# Patient Record
Sex: Male | Born: 1950 | Race: White | Hispanic: No | Marital: Married | Smoking: Former smoker
Health system: Southern US, Community
[De-identification: ages and names within clinical notes are randomized; demographics above are authoritative.]

## PROBLEM LIST (undated history)

## (undated) DIAGNOSIS — G2581 Restless legs syndrome: Secondary | ICD-10-CM

## (undated) DIAGNOSIS — I2510 Atherosclerotic heart disease of native coronary artery without angina pectoris: Secondary | ICD-10-CM

## (undated) DIAGNOSIS — E119 Type 2 diabetes mellitus without complications: Secondary | ICD-10-CM

## (undated) DIAGNOSIS — I10 Essential (primary) hypertension: Secondary | ICD-10-CM

## (undated) DIAGNOSIS — E785 Hyperlipidemia, unspecified: Secondary | ICD-10-CM

## (undated) DIAGNOSIS — M1990 Unspecified osteoarthritis, unspecified site: Secondary | ICD-10-CM

## (undated) DIAGNOSIS — K5790 Diverticulosis of intestine, part unspecified, without perforation or abscess without bleeding: Secondary | ICD-10-CM

## (undated) DIAGNOSIS — R51 Headache: Secondary | ICD-10-CM

## (undated) DIAGNOSIS — G8929 Other chronic pain: Secondary | ICD-10-CM

## (undated) DIAGNOSIS — R519 Headache, unspecified: Secondary | ICD-10-CM

## (undated) DIAGNOSIS — D649 Anemia, unspecified: Secondary | ICD-10-CM

## (undated) DIAGNOSIS — I209 Angina pectoris, unspecified: Secondary | ICD-10-CM

## (undated) DIAGNOSIS — G4700 Insomnia, unspecified: Secondary | ICD-10-CM

## (undated) DIAGNOSIS — M542 Cervicalgia: Secondary | ICD-10-CM

## (undated) HISTORY — PX: APPENDECTOMY: SHX54

## (undated) HISTORY — PX: OTHER SURGICAL HISTORY: SHX169

## (undated) HISTORY — DX: Hyperlipidemia, unspecified: E78.5

## (undated) HISTORY — DX: Headache, unspecified: R51.9

## (undated) HISTORY — DX: Headache: R51

## (undated) HISTORY — DX: Cervicalgia: M54.2

## (undated) HISTORY — DX: Insomnia, unspecified: G47.00

## (undated) HISTORY — PX: CYST REMOVAL HAND: SHX6279

## (undated) HISTORY — DX: Essential (primary) hypertension: I10

## (undated) HISTORY — DX: Other chronic pain: G89.29

## (undated) HISTORY — DX: Atherosclerotic heart disease of native coronary artery without angina pectoris: I25.10

---

## 2000-05-11 ENCOUNTER — Emergency Department (HOSPITAL_COMMUNITY): Admission: EM | Admit: 2000-05-11 | Discharge: 2000-05-11 | Payer: Self-pay | Admitting: Emergency Medicine

## 2000-07-06 DIAGNOSIS — I2510 Atherosclerotic heart disease of native coronary artery without angina pectoris: Secondary | ICD-10-CM | POA: Insufficient documentation

## 2000-07-06 HISTORY — PX: CORONARY ARTERY BYPASS GRAFT: SHX141

## 2000-12-11 ENCOUNTER — Inpatient Hospital Stay (HOSPITAL_COMMUNITY): Admission: EM | Admit: 2000-12-11 | Discharge: 2000-12-18 | Payer: Self-pay | Admitting: Emergency Medicine

## 2000-12-13 ENCOUNTER — Encounter: Payer: Self-pay | Admitting: Thoracic Surgery (Cardiothoracic Vascular Surgery)

## 2000-12-14 ENCOUNTER — Encounter: Payer: Self-pay | Admitting: Thoracic Surgery (Cardiothoracic Vascular Surgery)

## 2000-12-15 ENCOUNTER — Encounter: Payer: Self-pay | Admitting: Thoracic Surgery (Cardiothoracic Vascular Surgery)

## 2000-12-16 ENCOUNTER — Encounter: Payer: Self-pay | Admitting: Thoracic Surgery (Cardiothoracic Vascular Surgery)

## 2000-12-27 ENCOUNTER — Encounter
Admission: RE | Admit: 2000-12-27 | Discharge: 2000-12-27 | Payer: Self-pay | Admitting: Thoracic Surgery (Cardiothoracic Vascular Surgery)

## 2000-12-27 ENCOUNTER — Encounter: Payer: Self-pay | Admitting: Thoracic Surgery (Cardiothoracic Vascular Surgery)

## 2001-01-11 ENCOUNTER — Encounter (HOSPITAL_COMMUNITY): Admission: RE | Admit: 2001-01-11 | Discharge: 2001-02-07 | Payer: Self-pay | Admitting: Cardiology

## 2001-06-04 ENCOUNTER — Emergency Department (HOSPITAL_COMMUNITY): Admission: EM | Admit: 2001-06-04 | Discharge: 2001-06-04 | Payer: Self-pay | Admitting: Emergency Medicine

## 2003-01-31 ENCOUNTER — Inpatient Hospital Stay (HOSPITAL_COMMUNITY): Admission: EM | Admit: 2003-01-31 | Discharge: 2003-02-02 | Payer: Self-pay | Admitting: Emergency Medicine

## 2004-05-18 ENCOUNTER — Emergency Department (HOSPITAL_COMMUNITY): Admission: EM | Admit: 2004-05-18 | Discharge: 2004-05-18 | Payer: Self-pay | Admitting: Family Medicine

## 2004-05-18 ENCOUNTER — Ambulatory Visit (HOSPITAL_COMMUNITY): Admission: RE | Admit: 2004-05-18 | Discharge: 2004-05-18 | Payer: Self-pay | Admitting: Family Medicine

## 2004-09-05 ENCOUNTER — Ambulatory Visit: Payer: Self-pay | Admitting: Cardiology

## 2005-01-07 ENCOUNTER — Ambulatory Visit: Payer: Self-pay | Admitting: Family Medicine

## 2005-01-14 ENCOUNTER — Ambulatory Visit: Payer: Self-pay | Admitting: Family Medicine

## 2005-05-12 ENCOUNTER — Ambulatory Visit: Payer: Self-pay | Admitting: Family Medicine

## 2005-10-12 ENCOUNTER — Ambulatory Visit: Payer: Self-pay | Admitting: Family Medicine

## 2005-11-08 ENCOUNTER — Emergency Department (HOSPITAL_COMMUNITY): Admission: EM | Admit: 2005-11-08 | Discharge: 2005-11-08 | Payer: Self-pay | Admitting: Emergency Medicine

## 2006-02-03 ENCOUNTER — Ambulatory Visit: Payer: Self-pay | Admitting: Family Medicine

## 2006-03-26 ENCOUNTER — Ambulatory Visit: Payer: Self-pay | Admitting: Internal Medicine

## 2006-04-02 ENCOUNTER — Ambulatory Visit: Payer: Self-pay | Admitting: Internal Medicine

## 2006-04-02 HISTORY — PX: COLONOSCOPY: SHX174

## 2006-05-05 ENCOUNTER — Ambulatory Visit: Payer: Self-pay | Admitting: Cardiology

## 2006-05-10 ENCOUNTER — Ambulatory Visit: Payer: Self-pay | Admitting: Cardiology

## 2006-05-11 ENCOUNTER — Ambulatory Visit: Payer: Self-pay | Admitting: Family Medicine

## 2006-05-14 ENCOUNTER — Ambulatory Visit: Payer: Self-pay

## 2006-05-25 ENCOUNTER — Ambulatory Visit: Payer: Self-pay | Admitting: Family Medicine

## 2006-06-08 ENCOUNTER — Ambulatory Visit: Payer: Self-pay | Admitting: Family Medicine

## 2006-06-14 ENCOUNTER — Encounter: Admission: RE | Admit: 2006-06-14 | Discharge: 2006-09-12 | Payer: Self-pay | Admitting: Family Medicine

## 2006-07-01 ENCOUNTER — Ambulatory Visit: Payer: Self-pay | Admitting: Family Medicine

## 2006-09-03 ENCOUNTER — Ambulatory Visit: Payer: Self-pay | Admitting: Family Medicine

## 2006-11-01 ENCOUNTER — Ambulatory Visit: Payer: Self-pay | Admitting: Family Medicine

## 2007-04-04 ENCOUNTER — Ambulatory Visit: Payer: Self-pay | Admitting: Family Medicine

## 2007-04-04 DIAGNOSIS — I10 Essential (primary) hypertension: Secondary | ICD-10-CM | POA: Insufficient documentation

## 2007-04-04 DIAGNOSIS — E785 Hyperlipidemia, unspecified: Secondary | ICD-10-CM | POA: Insufficient documentation

## 2007-04-04 DIAGNOSIS — I2510 Atherosclerotic heart disease of native coronary artery without angina pectoris: Secondary | ICD-10-CM | POA: Insufficient documentation

## 2007-04-11 ENCOUNTER — Telehealth: Payer: Self-pay | Admitting: Family Medicine

## 2007-05-02 ENCOUNTER — Telehealth: Payer: Self-pay | Admitting: Family Medicine

## 2007-05-03 ENCOUNTER — Ambulatory Visit: Payer: Self-pay | Admitting: Family Medicine

## 2007-05-03 DIAGNOSIS — H9190 Unspecified hearing loss, unspecified ear: Secondary | ICD-10-CM | POA: Insufficient documentation

## 2007-05-06 ENCOUNTER — Ambulatory Visit: Payer: Self-pay | Admitting: Family Medicine

## 2007-05-09 LAB — CONVERTED CEMR LAB
ALT: 50 units/L (ref 0–53)
AST: 33 units/L (ref 0–37)
BUN: 11 mg/dL (ref 6–23)
Basophils Absolute: 0 10*3/uL (ref 0.0–0.1)
Basophils Relative: 0.7 % (ref 0.0–1.0)
CO2: 30 meq/L (ref 19–32)
Calcium: 9.4 mg/dL (ref 8.4–10.5)
Chloride: 107 meq/L (ref 96–112)
Direct LDL: 156.3 mg/dL
Eosinophils Absolute: 0.2 10*3/uL (ref 0.0–0.6)
HCT: 45.8 % (ref 39.0–52.0)
Lymphocytes Relative: 33.9 % (ref 12.0–46.0)
Neutrophils Relative %: 51.9 % (ref 43.0–77.0)
Potassium: 3.9 meq/L (ref 3.5–5.1)
RBC: 4.88 M/uL (ref 4.22–5.81)
Sodium: 143 meq/L (ref 135–145)
Total Bilirubin: 0.9 mg/dL (ref 0.3–1.2)
Triglycerides: 102 mg/dL (ref 0–149)

## 2007-05-20 ENCOUNTER — Encounter: Payer: Self-pay | Admitting: Family Medicine

## 2007-07-04 ENCOUNTER — Encounter: Payer: Self-pay | Admitting: Family Medicine

## 2007-08-26 ENCOUNTER — Encounter: Payer: Self-pay | Admitting: Family Medicine

## 2007-09-02 ENCOUNTER — Telehealth: Payer: Self-pay | Admitting: Family Medicine

## 2007-09-13 ENCOUNTER — Encounter: Payer: Self-pay | Admitting: Family Medicine

## 2007-09-27 ENCOUNTER — Ambulatory Visit: Payer: Self-pay | Admitting: Family Medicine

## 2007-10-31 ENCOUNTER — Encounter: Payer: Self-pay | Admitting: Family Medicine

## 2007-12-22 ENCOUNTER — Telehealth: Payer: Self-pay | Admitting: Family Medicine

## 2008-02-06 ENCOUNTER — Telehealth: Payer: Self-pay | Admitting: Family Medicine

## 2008-05-16 ENCOUNTER — Telehealth: Payer: Self-pay | Admitting: Family Medicine

## 2008-07-16 ENCOUNTER — Ambulatory Visit: Payer: Self-pay | Admitting: Family Medicine

## 2008-07-16 ENCOUNTER — Telehealth: Payer: Self-pay | Admitting: Family Medicine

## 2008-07-16 DIAGNOSIS — R519 Headache, unspecified: Secondary | ICD-10-CM | POA: Insufficient documentation

## 2008-07-16 DIAGNOSIS — M79609 Pain in unspecified limb: Secondary | ICD-10-CM | POA: Insufficient documentation

## 2008-07-16 DIAGNOSIS — R51 Headache: Secondary | ICD-10-CM | POA: Insufficient documentation

## 2008-07-18 ENCOUNTER — Telehealth: Payer: Self-pay | Admitting: Family Medicine

## 2008-07-18 ENCOUNTER — Ambulatory Visit: Payer: Self-pay | Admitting: Cardiology

## 2008-07-18 ENCOUNTER — Ambulatory Visit: Payer: Self-pay | Admitting: Family Medicine

## 2008-07-19 ENCOUNTER — Encounter: Payer: Self-pay | Admitting: Family Medicine

## 2008-07-19 LAB — CONVERTED CEMR LAB
BUN: 13 mg/dL (ref 6–23)
CO2: 30 meq/L (ref 19–32)
Calcium: 9.7 mg/dL (ref 8.4–10.5)
Chloride: 102 meq/L (ref 96–112)
GFR calc Af Amer: 128 mL/min
GFR calc non Af Amer: 106 mL/min
Glucose, Bld: 182 mg/dL — ABNORMAL HIGH (ref 70–99)
Potassium: 3.9 meq/L (ref 3.5–5.1)
Sodium: 140 meq/L (ref 135–145)

## 2008-07-30 ENCOUNTER — Telehealth: Payer: Self-pay | Admitting: Family Medicine

## 2008-08-07 ENCOUNTER — Encounter: Payer: Self-pay | Admitting: Family Medicine

## 2008-08-08 ENCOUNTER — Telehealth: Payer: Self-pay | Admitting: Family Medicine

## 2008-08-13 ENCOUNTER — Encounter: Admission: RE | Admit: 2008-08-13 | Discharge: 2008-08-13 | Payer: Self-pay | Admitting: Specialist

## 2008-08-21 ENCOUNTER — Ambulatory Visit: Payer: Self-pay | Admitting: Family Medicine

## 2008-08-21 LAB — CONVERTED CEMR LAB
Bilirubin Urine: NEGATIVE
Blood in Urine, dipstick: NEGATIVE
Nitrite: NEGATIVE
Specific Gravity, Urine: 1.025

## 2008-08-27 LAB — CONVERTED CEMR LAB
ALT: 64 units/L — ABNORMAL HIGH (ref 0–53)
AST: 48 units/L — ABNORMAL HIGH (ref 0–37)
Albumin: 4.1 g/dL (ref 3.5–5.2)
BUN: 17 mg/dL (ref 6–23)
Basophils Relative: 0.3 % (ref 0.0–3.0)
Bilirubin, Direct: 0.1 mg/dL (ref 0.0–0.3)
CO2: 30 meq/L (ref 19–32)
Calcium: 9.7 mg/dL (ref 8.4–10.5)
Eosinophils Absolute: 0.1 10*3/uL (ref 0.0–0.7)
Eosinophils Relative: 1.5 % (ref 0.0–5.0)
GFR calc Af Amer: 128 mL/min
GFR calc non Af Amer: 106 mL/min
HCT: 45.4 % (ref 39.0–52.0)
HDL: 32.4 mg/dL — ABNORMAL LOW (ref >39.0)
Hgb A1c MFr Bld: 7 % — ABNORMAL HIGH (ref 4.6–6.0)
MCV: 95.6 fL (ref 78.0–100.0)
Monocytes Absolute: 0.8 10*3/uL (ref 0.1–1.0)
Monocytes Relative: 11.6 % (ref 3.0–12.0)
Potassium: 3.9 meq/L (ref 3.5–5.1)
TSH: 2.48 microintl units/mL (ref 0.35–5.50)
Total CHOL/HDL Ratio: 6.5
Total Protein: 7.2 g/dL (ref 6.0–8.3)
Triglycerides: 139 mg/dL (ref 0–149)
VLDL: 28 mg/dL (ref 0–40)
WBC: 6.9 10*3/uL (ref 4.5–10.5)

## 2008-08-28 ENCOUNTER — Ambulatory Visit: Payer: Self-pay | Admitting: Family Medicine

## 2008-08-31 ENCOUNTER — Telehealth: Payer: Self-pay | Admitting: Family Medicine

## 2008-09-20 ENCOUNTER — Emergency Department (HOSPITAL_COMMUNITY): Admission: EM | Admit: 2008-09-20 | Discharge: 2008-09-20 | Payer: Self-pay | Admitting: Emergency Medicine

## 2008-09-24 ENCOUNTER — Telehealth: Payer: Self-pay | Admitting: Family Medicine

## 2008-11-16 ENCOUNTER — Telehealth: Payer: Self-pay | Admitting: Family Medicine

## 2008-11-22 ENCOUNTER — Encounter: Payer: Self-pay | Admitting: Family Medicine

## 2008-12-26 ENCOUNTER — Encounter: Payer: Self-pay | Admitting: Family Medicine

## 2008-12-26 DIAGNOSIS — I10 Essential (primary) hypertension: Secondary | ICD-10-CM

## 2008-12-26 HISTORY — DX: Essential (primary) hypertension: I10

## 2009-07-04 ENCOUNTER — Ambulatory Visit: Payer: Self-pay | Admitting: Family Medicine

## 2009-07-09 LAB — CONVERTED CEMR LAB: Glucose, Bld: 128 mg/dL — ABNORMAL HIGH (ref 70–99)

## 2009-07-11 ENCOUNTER — Telehealth: Payer: Self-pay | Admitting: Family Medicine

## 2009-09-03 ENCOUNTER — Telehealth: Payer: Self-pay | Admitting: Family Medicine

## 2009-09-03 ENCOUNTER — Ambulatory Visit: Payer: Self-pay | Admitting: Family Medicine

## 2009-09-03 DIAGNOSIS — M542 Cervicalgia: Secondary | ICD-10-CM | POA: Insufficient documentation

## 2009-09-09 ENCOUNTER — Ambulatory Visit: Payer: Self-pay | Admitting: Family Medicine

## 2009-09-25 ENCOUNTER — Encounter: Payer: Self-pay | Admitting: Family Medicine

## 2009-10-16 ENCOUNTER — Ambulatory Visit: Payer: Self-pay | Admitting: Family Medicine

## 2009-10-18 LAB — CONVERTED CEMR LAB
Albumin: 4.2 g/dL (ref 3.5–5.2)
BUN: 10 mg/dL (ref 6–23)
Calcium: 9.4 mg/dL (ref 8.4–10.5)
Chloride: 103 meq/L (ref 96–112)
Creatinine, Ser: 0.8 mg/dL (ref 0.4–1.5)
GFR calc non Af Amer: 104.79 mL/min (ref >60)
Glucose, Bld: 170 mg/dL — ABNORMAL HIGH (ref 70–99)
Hemoglobin: 15.1 g/dL (ref 13.0–17.0)
Hgb A1c MFr Bld: 6.5 % (ref 4.6–6.5)
MCV: 95.5 fL (ref 78.0–100.0)
Monocytes Absolute: 0.8 10*3/uL (ref 0.1–1.0)
Neutro Abs: 3 10*3/uL (ref 1.4–7.7)
RBC: 4.63 M/uL (ref 4.22–5.81)
RDW: 13 % (ref 11.5–14.6)
TSH: 2.52 microintl units/mL (ref 0.35–5.50)
WBC: 6.7 10*3/uL (ref 4.5–10.5)

## 2009-10-21 ENCOUNTER — Telehealth: Payer: Self-pay | Admitting: Family Medicine

## 2009-11-20 ENCOUNTER — Encounter: Payer: Self-pay | Admitting: Family Medicine

## 2009-12-20 ENCOUNTER — Ambulatory Visit: Payer: Self-pay | Admitting: Family Medicine

## 2009-12-23 ENCOUNTER — Telehealth: Payer: Self-pay | Admitting: Family Medicine

## 2009-12-24 ENCOUNTER — Encounter: Payer: Self-pay | Admitting: Family Medicine

## 2009-12-27 ENCOUNTER — Telehealth: Payer: Self-pay | Admitting: Family Medicine

## 2009-12-27 ENCOUNTER — Encounter: Admission: RE | Admit: 2009-12-27 | Discharge: 2009-12-27 | Payer: Self-pay | Admitting: Family Medicine

## 2010-01-03 ENCOUNTER — Telehealth: Payer: Self-pay | Admitting: Family Medicine

## 2010-01-07 ENCOUNTER — Encounter: Payer: Self-pay | Admitting: Family Medicine

## 2010-01-20 ENCOUNTER — Telehealth (INDEPENDENT_AMBULATORY_CARE_PROVIDER_SITE_OTHER): Payer: Self-pay | Admitting: *Deleted

## 2010-01-29 ENCOUNTER — Encounter: Payer: Self-pay | Admitting: Family Medicine

## 2010-03-13 ENCOUNTER — Ambulatory Visit: Payer: Self-pay | Admitting: Family Medicine

## 2010-03-13 LAB — CONVERTED CEMR LAB
Bilirubin Urine: NEGATIVE
Ketones, urine, test strip: NEGATIVE
Nitrite: NEGATIVE
Urobilinogen, UA: 0.2
pH: 5

## 2010-03-14 ENCOUNTER — Encounter: Payer: Self-pay | Admitting: Family Medicine

## 2010-04-08 ENCOUNTER — Encounter: Payer: Self-pay | Admitting: Family Medicine

## 2010-05-12 ENCOUNTER — Encounter: Payer: Self-pay | Admitting: Family Medicine

## 2010-05-21 ENCOUNTER — Telehealth: Payer: Self-pay | Admitting: Family Medicine

## 2010-05-28 ENCOUNTER — Ambulatory Visit: Payer: Self-pay | Admitting: Family Medicine

## 2010-06-09 ENCOUNTER — Ambulatory Visit: Payer: Self-pay | Admitting: Family Medicine

## 2010-06-09 DIAGNOSIS — F411 Generalized anxiety disorder: Secondary | ICD-10-CM | POA: Insufficient documentation

## 2010-08-05 NOTE — Progress Notes (Signed)
Summary: disability  Phone Note Call from Patient   Caller: Patient Call For: Nelwyn Salisbury MD Summary of Call: [phone] Pt. is ready to have papers sent for disability.  They have applied, and papers should be on the way. Initial call taken by: Lynann Beaver CMA,  January 03, 2010 10:48 AM  Follow-up for Phone Call        noted Follow-up by: Nelwyn Salisbury MD,  January 03, 2010 11:25 AM

## 2010-08-05 NOTE — Letter (Signed)
Summary: [facility]  [facility]   Imported By: Maryln Gottron 02/07/2010 14:32:43  _____________________________________________________________________  External Attachment:    Type:   Image     Comment:   External Document

## 2010-08-05 NOTE — Progress Notes (Signed)
Summary: number for MRI  Phone Note Call from Patient   Caller: Spouse Call For: Nelwyn Salisbury MD Summary of Call: Please use these two numbers when calling back for MRI appt.  [phone] or [phone] Initial call taken by: Lynann Beaver CMA,  December 23, 2009 9:14 AM  Follow-up for Phone Call        Appt Scheduled.  Pt aware. Follow-up by: Corky Mull,  December 25, 2009 11:00 AM

## 2010-08-05 NOTE — Assessment & Plan Note (Signed)
Summary: cough/chest congestion/cjr   Vital Signs:  Patient profile:   60 year old male Weight:      222.5 pounds O2 Sat:      98 % Temp:     98.3 degrees F Pulse rate:   87 / minute BP sitting:   130 / 82  (left arm) Cuff size:   large  Vitals Entered By: Pura Spice, RN (May 28, 2010 11:06 AM) CC: cough congestion BS running up taking OTC meds  Is Patient Diabetic? Yes Did you bring your meter with you today? No   History of Present Illness: Here for 2 weeks of PND, chest congestion, and coughing up yellow sputum. On Mucinex DM. No fever. 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 He does plan to stay active in his church though.   Allergies: 1)  ! Codeine 2)  ! * Ambien 3)  ! Cipro  Past History:  Past Medical History: Coronary artery disease, sees Dr. Tresa Endo Hyperlipidemia Hypertension Diabetes mellitus, type II insomnia chronic HA, sees Dr. Neale Burly at [facility] chronic neck pain from cervical spine disease, sees Dr. Barnett Abu and Dr. Loraine Leriche Philips  Past Surgical History: Appendectomy Colonoscopy 04/02/06 per Dr. Leone Payor, repeat in 10 yrs Coronary artery bypass graft, 4 vessels 2002 normal stress test 02-21-03 has received cervical spine ESI injections per Dr. Vito Berger  Review of Systems  The patient denies anorexia, fever, weight loss, weight gain, vision loss, decreased hearing, hoarseness, chest pain, syncope, dyspnea on exertion, peripheral edema, headaches, hemoptysis, abdominal pain, melena, hematochezia, severe indigestion/heartburn, hematuria, incontinence, genital sores, muscle weakness, suspicious skin lesions, transient blindness, difficulty walking, depression, unusual weight change, abnormal bleeding, enlarged lymph nodes, angioedema, breast masses, and testicular masses.    Physical Exam  General:  Well-developed,well-nourished,in no acute distress;  alert,appropriate and cooperative throughout examination Head:  Normocephalic and atraumatic without obvious abnormalities. No apparent alopecia or balding. Eyes:  No corneal or conjunctival inflammation noted. EOMI. Perrla. Funduscopic exam benign, without hemorrhages, exudates or papilledema. Vision grossly normal. Ears:  External ear exam shows no significant lesions or deformities.  Otoscopic examination reveals clear canals, tympanic membranes are intact bilaterally without bulging, retraction, inflammation or discharge. Hearing is grossly normal bilaterally. Nose:  External nasal examination shows no deformity or inflammation. Nasal mucosa are pink and moist without lesions or exudates. Mouth:  Oral mucosa and oropharynx without lesions or exudates.  Teeth in good repair. Neck:  No deformities, masses, or tenderness noted. Lungs:  Normal respiratory effort, chest expands symmetrically. Lungs are clear to auscultation, no crackles or wheezes. Heart:  Normal rate and regular rhythm. S1 and S2 normal without gallop, murmur, click, rub or other extra sounds.   Impression & Recommendations:  Problem # 1:  ACUTE BRONCHITIS (ICD-466.0)  His updated medication list for this problem includes:    Zithromax Z-pak 250 Mg Tabs (Azithromycin) .Marland Kitchen... As directed  Problem # 2:  NECK PAIN (ICD-723.1)  His updated medication list for this problem includes:    Aspir-low 81 Mg Tbec (Aspirin) .Marland Kitchen... 1 every other day    Diclofenac Sodium 50 Mg Tbec (Diclofenac sodium) .Marland Kitchen... Three times a day as needed pain    Vicodin 5-500 Mg Tabs (Hydrocodone-acetaminophen) .Marland Kitchen... 1 q 6 hours as needed pain    Flexeril 10 Mg Tabs (Cyclobenzaprine hcl) .Marland Kitchen... Three times a day as needed spasm  Complete Medication List: 1)  Multivitamins Tabs (Multiple vitamin) .Marland KitchenMarland KitchenMarland Kitchen  1 by mouth once daily 2)  Aspir-low 81 Mg Tbec (Aspirin) .Marland Kitchen.. 1 every other day 3)  Freestyle Test Strp (Glucose blood) .... Use as directed 4)  Flaxseed Oil  1200 Mg Caps (Flaxseed (linseed)) .... 2 every am 5)  Fish Oil 1000 Mg Caps (Omega-3 fatty acids) .... 2 tabs every am 6)  Glyburide-metformin 5-500 Mg Tabs (Glyburide-metformin) .Marland Kitchen.. 1 bid 7)  Diclofenac Sodium 50 Mg Tbec (Diclofenac sodium) .... Three times a day as needed pain 8)  Crestor 10 Mg Tabs (Rosuvastatin calcium) .Marland Kitchen.. 1 once daily 9)  Niaspan 500 Mg Cr-tabs (Niacin (antihyperlipidemic)) .... 1/2 at bedtime 10)  Vicodin 5-500 Mg Tabs (Hydrocodone-acetaminophen) .Marland Kitchen.. 1 q 6 hours as needed pain 11)  Flexeril 10 Mg Tabs (Cyclobenzaprine hcl) .... Three times a day as needed spasm 12)  Bystolic 5 Mg Tabs (Nebivolol hcl) .Marland Kitchen.. 1 by mouth once daily 13)  Lisinopril 20 Mg Tabs (Lisinopril) .... Once daily 14)  Zithromax Z-pak 250 Mg Tabs (Azithromycin) .... As directed  Patient Instructions: 1)  Please schedule a follow-up appointment as needed .  Prescriptions: ZITHROMAX Z-PAK 250 MG TABS (AZITHROMYCIN) as directed  #1 x 0   Entered and Authorized by:   Nelwyn Salisbury MD   Signed by:   Nelwyn Salisbury MD on 05/28/2010   Method used:   [facility] to        [facility]  [address] [phone][facility] (retail)       [address]       [place], [place]  [address]       Ph: [phone]       Fax: [phone]   RxID:   [id number]    Orders Added: 1)  Est. Patient Level IV [69485]

## 2010-08-05 NOTE — Letter (Signed)
Summary: [facility]  [facility]   Imported By: Maryln Gottron 10/01/2009 14:48:39  _____________________________________________________________________  External Attachment:    Type:   Image     Comment:   External Document

## 2010-08-05 NOTE — Progress Notes (Signed)
Summary: workmans comp  Phone Note Call from Patient Call back at [phone]   Caller: Patient---voice mail Reason for Call: Talk to Nurse Summary of Call: wants to speak with nurse about a worker's comp. issues. saw dr Clent Ridges last Friday. Initial call taken by: Warnell Forester,  December 27, 2009 10:11 AM  Follow-up for Phone Call        [email or web address] Follow-up by: Raechel Ache, RN,  December 27, 2009 11:46 AM  Additional Follow-up for Phone Call Additional follow up Details #1::        please return call at [phone]. Additional Follow-up by: Warnell Forester,  December 27, 2009 1:52 PM    Additional Follow-up for Phone Call Additional follow up Details #2::    needs info sent to workman's comp for missing time all this week. He hurt his neck at work- heard a "pop". Had MRI today.  Send records to fax [phone] or phone [phone] Follow-up by: Raechel Ache, RN,  December 27, 2009 2:12 PM  Additional Follow-up for Phone Call Additional follow up Details #3:: Details for Additional Follow-up Action Taken: see the MRI report. We will send him to see neurosurgery. Have medical records send them any records they need  Additional Follow-up by: Nelwyn Salisbury MD,  December 27, 2009 2:40 PM   Appended Document: workmans comp faxed to [employer]

## 2010-08-05 NOTE — Assessment & Plan Note (Signed)
Summary: achy/cdw   Vital Signs:  Patient profile:   60 year old male Weight:      223 pounds Temp:     98.1 degrees F oral Pulse rate:   76 / minute BP sitting:   110 / 68  (left arm) Cuff size:   large  Vitals Entered By: Alfred Levins, CMA (September 03, 2009 11:21 AM) CC: achy, nausea, runny nose   History of Present Illness: Here for 2 reasons. First he has had pain and stiffness in the neck for several years, but this has gotten worse in the past month. No hx of trauma. Sometimes pains radiate down both arms, and sometimes he gets numbness in the 3rd through 5th finegrs of the left hand. Using Advil with mixed results. Second, he had the onset this am of generalized weakness, urge to urinate, and mild nausea without vomitting. No fever. No cough or ST. He drinks plenty of water.   Current Medications (verified): 1)  Multivitamins   Tabs (Multiple Vitamin) .Marland Kitchen.. 1 By Mouth Once Daily 2)  Aspir-Low 81 Mg Tbec (Aspirin) .Marland Kitchen.. 1 Every Other Day 3)  Metoprolol Tartrate 50 Mg Tabs (Metoprolol Tartrate) .... 1/2  By Mouth Two Times A Day 4)  Lisinopril 10 Mg  Tabs (Lisinopril) .... Once Daily 5)  Freestyle Test Strp (Glucose Blood) .... Use As Directed 6)  Flaxseed Oil 1200 Mg Caps (Flaxseed (Linseed)) .... 2 Every Am 7)  Fish Oil 1000 Mg Caps (Omega-3 Fatty Acids) .... 2 Tabs Every Am 8)  Glyburide-Metformin 5-500 Mg Tabs (Glyburide-Metformin) .Marland Kitchen.. 1 Bid  Allergies (verified): 1)  ! Codeine 2)  ! Crestor (Rosuvastatin Calcium) 3)  ! * Ambien  Past History:  Past Medical History: Reviewed history from 08/28/2008 and no changes required. Coronary artery disease, sees Dr. Tresa Endo Hyperlipidemia Hypertension Diabetes mellitus, type II insomnia chronic HA, sees Dr. Neale Burly at [facility]  Review of Systems  The patient denies anorexia, fever, weight loss, weight gain, vision loss, decreased hearing, hoarseness, chest pain, syncope, dyspnea on exertion, peripheral edema,  prolonged cough, headaches, hemoptysis, abdominal pain, melena, hematochezia, severe indigestion/heartburn, hematuria, incontinence, genital sores, muscle weakness, suspicious skin lesions, transient blindness, difficulty walking, depression, unusual weight change, abnormal bleeding, enlarged lymph nodes, angioedema, breast masses, and testicular masses.    Physical Exam  General:  Well-developed,well-nourished,in no acute distress; alert,appropriate and cooperative throughout examination Neck:  mildly tender posteriorly, reduced ROM Lungs:  Normal respiratory effort, chest expands symmetrically. Lungs are clear to auscultation, no crackles or wheezes. Heart:  Normal rate and regular rhythm. S1 and S2 normal without gallop, murmur, click, rub or other extra sounds. Abdomen:  Bowel sounds positive,abdomen soft and non-tender without masses, organomegaly or hernias noted. Neurologic:  No cranial nerve deficits noted. Station and gait are normal. Plantar reflexes are down-going bilaterally. DTRs are symmetrical throughout. Sensory, motor and coordinative functions appear intact.   Impression & Recommendations:  Problem # 1:  NECK PAIN (ICD-723.1)  The following medications were removed from the medication list:    Vicodin 5-500 Mg Tabs (Hydrocodone-acetaminophen) .Marland Kitchen... 1 by mouth every 6 hours as needed for headache His updated medication list for this problem includes:    Aspir-low 81 Mg Tbec (Aspirin) .Marland Kitchen... 1 every other day    Diclofenac Sodium 50 Mg Tbec (Diclofenac sodium) .Marland Kitchen... Three times a day as needed pain  Problem # 2:  ACUTE PROSTATITIS (ICD-601.0)  Complete Medication List: 1)  Multivitamins Tabs (Multiple vitamin) .Marland Kitchen.. 1 by mouth once  daily 2)  Aspir-low 81 Mg Tbec (Aspirin) .Marland Kitchen.. 1 every other day 3)  Metoprolol Tartrate 50 Mg Tabs (Metoprolol tartrate) .... 1/2  by mouth two times a day 4)  Lisinopril 10 Mg Tabs (Lisinopril) .... Once daily 5)  Freestyle Test Strp (Glucose  blood) .... Use as directed 6)  Flaxseed Oil 1200 Mg Caps (Flaxseed (linseed)) .... 2 every am 7)  Fish Oil 1000 Mg Caps (Omega-3 fatty acids) .... 2 tabs every am 8)  Glyburide-metformin 5-500 Mg Tabs (Glyburide-metformin) .Marland Kitchen.. 1 bid 9)  Cipro 500 Mg Tabs (Ciprofloxacin hcl) .... Two times a day 10)  Diclofenac Sodium 50 Mg Tbec (Diclofenac sodium) .... Three times a day as needed pain  Patient Instructions: 1)  Please schedule a follow-up appointment as needed . Off work today until 09-09-09. Prescriptions: METOPROLOL TARTRATE 50 MG TABS (METOPROLOL TARTRATE) 1/2  by mouth two times a day  #60 x 5   Entered and Authorized by:   Nelwyn Salisbury MD   Signed by:   Nelwyn Salisbury MD on 09/03/2009   Method used:   [facility]  [address] [phone]* (retail)       [address]       [place], [place]  [address]       Ph: [phone]       Fax: [phone]   RxID:   [id number] LISINOPRIL 10 MG  TABS (LISINOPRIL) once daily  #60 x 5   Entered and Authorized by:   Nelwyn Salisbury MD   Signed by:   Nelwyn Salisbury MD on 09/03/2009   Method used:   [facility]        [facility]  [address] [phone]* (retail)       [address]       [place], [place]  [address]       Ph: [phone]       Fax: [phone]   RxID:   [id number] DICLOFENAC SODIUM 50 MG TBEC (DICLOFENAC SODIUM) three times a day as needed pain  #60 x 5   Entered and Authorized by:   Nelwyn Salisbury MD   Signed by:   Nelwyn Salisbury MD on 09/03/2009   Method used:   [facility]        [facility]  [address] [phone]* (retail)       [address]       [place], [place]  [address]       Ph: [phone]       Fax: [phone]   RxID:   [id number] CIPRO 500 MG TABS (CIPROFLOXACIN HCL) two times a day  #28 x 0   Entered and Authorized by:   Nelwyn Salisbury MD   Signed by:   Nelwyn Salisbury MD on 09/03/2009   Method used:   [facility]         [facility]  [address] [phone]* (retail)       [address]       [place], [place]  [address]       Ph: [phone]       Fax: [phone]   RxID:   [id number]

## 2010-08-05 NOTE — Letter (Signed)
Summary: [facility]  [facility]   Imported By: Maryln Gottron 04/16/2010 13:09:59  _____________________________________________________________________  External Attachment:    Type:   Image     Comment:   External Document

## 2010-08-05 NOTE — Assessment & Plan Note (Signed)
Summary: HEADACHES? // RS   Vital Signs:  Patient profile:   60 year old male Weight:      223 pounds Temp:     97.8 degrees F oral BP sitting:   138 / 82  (left arm) Cuff size:   regular  Vitals Entered By: Raechel Ache, RN (December 20, 2009 1:07 PM) CC: C/o worsening neck pain- L ear and L side of head hurts.   History of Present Illness: Here for several months of worsening sharp pains in the left side of the neck at the base of the skull which radiate up the left side of the face and to the ear. His ROM is quite limited. using Dicolfenac, Vicodin, and heat. No trauma hx.   Allergies: 1)  ! Codeine 2)  ! Crestor (Rosuvastatin Calcium) 3)  ! * Ambien 4)  ! Cipro  Past History:  Past Medical History: Reviewed history from 08/28/2008 and no changes required. Coronary artery disease, sees Dr. Tresa Endo Hyperlipidemia Hypertension Diabetes mellitus, type II insomnia chronic HA, sees Dr. Neale Burly at [facility]  Past Surgical History: Reviewed history from 08/28/2008 and no changes required. Appendectomy Colonoscopy 04/02/06 per Dr. Leone Payor, repeat in 10 yrs Coronary artery bypass graft, 4 vessels 2002 normal stress test 02-21-03  Review of Systems  The patient denies anorexia, fever, weight loss, weight gain, vision loss, decreased hearing, hoarseness, chest pain, syncope, dyspnea on exertion, peripheral edema, prolonged cough, hemoptysis, abdominal pain, melena, hematochezia, severe indigestion/heartburn, hematuria, incontinence, genital sores, muscle weakness, suspicious skin lesions, transient blindness, difficulty walking, depression, unusual weight change, abnormal bleeding, enlarged lymph nodes, angioedema, breast masses, and testicular masses.    Physical Exam  General:  he holds his neck stiffly and turns en bloc from side to side Neck:  tender over the left neck at the skull base, ROM is very limited, some spasm is present Neurologic:  No cranial nerve deficits  noted. Station and gait are normal. Plantar reflexes are down-going bilaterally. DTRs are symmetrical throughout. Sensory, motor and coordinative functions appear intact.   Impression & Recommendations:  Problem # 1:  NECK PAIN (ICD-723.1)  His updated medication list for this problem includes:    Aspir-low 81 Mg Tbec (Aspirin) .Marland Kitchen... 1 every other day    Diclofenac Sodium 50 Mg Tbec (Diclofenac sodium) .Marland Kitchen... Three times a day as needed pain    Vicodin 5-500 Mg Tabs (Hydrocodone-acetaminophen) .Marland Kitchen... 1 q 6 hours as needed pain    Flexeril 10 Mg Tabs (Cyclobenzaprine hcl) .Marland Kitchen... Three times a day as needed spasm  Orders: Radiology Referral (Radiology)  Problem # 2:  HEADACHE (ICD-784.0)  His updated medication list for this problem includes:    Aspir-low 81 Mg Tbec (Aspirin) .Marland Kitchen... 1 every other day    Metoprolol Tartrate 50 Mg Tabs (Metoprolol tartrate) .Marland Kitchen... 1/2  by mouth two times a day    Diclofenac Sodium 50 Mg Tbec (Diclofenac sodium) .Marland Kitchen... Three times a day as needed pain    Vicodin 5-500 Mg Tabs (Hydrocodone-acetaminophen) .Marland Kitchen... 1 q 6 hours as needed pain  Complete Medication List: 1)  Multivitamins Tabs (Multiple vitamin) .Marland Kitchen.. 1 by mouth once daily 2)  Aspir-low 81 Mg Tbec (Aspirin) .Marland Kitchen.. 1 every other day 3)  Metoprolol Tartrate 50 Mg Tabs (Metoprolol tartrate) .... 1/2  by mouth two times a day 4)  Lisinopril 10 Mg Tabs (Lisinopril) .... Once daily 5)  Freestyle Test Strp (Glucose blood) .... Use as directed 6)  Flaxseed Oil 1200 Mg Caps (  Flaxseed (linseed)) .... 2 every am 7)  Fish Oil 1000 Mg Caps (Omega-3 fatty acids) .... 2 tabs every am 8)  Glyburide-metformin 5-500 Mg Tabs (Glyburide-metformin) .Marland Kitchen.. 1 bid 9)  Diclofenac Sodium 50 Mg Tbec (Diclofenac sodium) .... Three times a day as needed pain 10)  Crestor 10 Mg Tabs (Rosuvastatin calcium) .Marland Kitchen.. 1 once daily 11)  Niaspan 500 Mg Cr-tabs (Niacin (antihyperlipidemic)) .... 1/2 at bedtime 12)  Vicodin 5-500 Mg Tabs  (Hydrocodone-acetaminophen) .Marland Kitchen.. 1 q 6 hours as needed pain 13)  Flexeril 10 Mg Tabs (Cyclobenzaprine hcl) .... Three times a day as needed spasm  Patient Instructions: 1)  will get an MRI of the neck. He obviously has a pinched nerve. Out of work today until 01-07-10. Prescriptions: FLEXERIL 10 MG TABS (CYCLOBENZAPRINE HCL) three times a day as needed spasm  #60 x 5   Entered and Authorized by:   Nelwyn Salisbury MD   Signed by:   Nelwyn Salisbury MD on 12/20/2009   Method used:   Print then Give to Patient   RxID:   [id number] VICODIN 5-500 MG TABS (HYDROCODONE-ACETAMINOPHEN) 1 q 6 hours as needed pain  #60 x 3   Entered and Authorized by:   Nelwyn Salisbury MD   Signed by:   Nelwyn Salisbury MD on 12/20/2009   Method used:   Print then Give to Patient   RxID:   [id number]

## 2010-08-05 NOTE — Assessment & Plan Note (Signed)
Summary: HYPERGLYCEMIA CONCERNS // RS   Vital Signs:  Patient profile:   60 year old male Height:      70 inches Weight:      221 pounds BMI:     31.82 O2 Sat:      98 % on Room air Temp:     98.1 degrees F oral Pulse rate:   58 / minute BP sitting:   130 / 80  (left arm) Cuff size:   regular  Vitals Entered By: Bill Salinas CMA (June 09, 2010 11:37 AM)  O2 Flow:  Room air CC: pt here to discuss elevated CBG/ ab CBG Result 274   History of Present Illness: Here for several issues. First his blood glucoses have been running in the mid 200s for about a month, and sometimes they may hit 300 after dinner. He did recently receive an ESI in the neck per Dr. Nickola Major, and no doubt this has aggravated the situation. He feel fine in general, but he does mention someproblems with anxiety and insomnia lately. he has used a few Clonazepams he borrowed from his wife, and these work well.   Current Medications (verified): 1)  Multivitamins   Tabs (Multiple Vitamin) .Marland Kitchen.. 1 By Mouth Once Daily 2)  Aspir-Low 81 Mg Tbec (Aspirin) .Marland Kitchen.. 1 Every Other Day 3)  Freestyle Test Strp (Glucose Blood) .... Use As Directed 4)  Flaxseed Oil 1200 Mg Caps (Flaxseed (Linseed)) .... 2 Every Am 5)  Fish Oil 1000 Mg Caps (Omega-3 Fatty Acids) .... 2 Tabs Every Am 6)  Glyburide-Metformin 5-500 Mg Tabs (Glyburide-Metformin) .Marland Kitchen.. 1 Bid 7)  Diclofenac Sodium 50 Mg Tbec (Diclofenac Sodium) .... Three Times A Day As Needed Pain 8)  Crestor 10 Mg Tabs (Rosuvastatin Calcium) .Marland Kitchen.. 1 Once Daily 9)  Niaspan 500 Mg Cr-Tabs (Niacin (Antihyperlipidemic)) .... 1/2 At Bedtime 10)  Vicodin 5-500 Mg Tabs (Hydrocodone-Acetaminophen) .Marland Kitchen.. 1 Q 6 Hours As Needed Pain 11)  Flexeril 10 Mg Tabs (Cyclobenzaprine Hcl) .... Three Times A Day As Needed Spasm 12)  Bystolic 5 Mg Tabs (Nebivolol Hcl) .Marland Kitchen.. 1 By Mouth Once Daily 13)  Lisinopril 20 Mg Tabs (Lisinopril) .... Once Daily  Allergies (verified): 1)  ! Codeine 2)  ! *  Ambien 3)  ! Cipro  Past History:  Past Medical History: Reviewed history from 05/28/2010 and no changes required. Coronary artery disease, sees Dr. Tresa Endo Hyperlipidemia Hypertension Diabetes mellitus, type II insomnia chronic HA, sees Dr. Neale Burly at [facility] chronic neck pain from cervical spine disease, sees Dr. Barnett Abu and Dr. Janalyn Shy  Past Surgical History: Reviewed history from 05/28/2010 and no changes required. Appendectomy Colonoscopy 04/02/06 per Dr. Leone Payor, repeat in 10 yrs Coronary artery bypass graft, 4 vessels 2002 normal stress test 02-21-03 has received cervical spine ESI injections per Dr. Vito Berger  Review of Systems  The patient denies anorexia, fever, weight loss, weight gain, vision loss, decreased hearing, hoarseness, chest pain, syncope, dyspnea on exertion, peripheral edema, prolonged cough, headaches, hemoptysis, abdominal pain, melena, hematochezia, severe indigestion/heartburn, hematuria, incontinence, genital sores, muscle weakness, suspicious skin lesions, transient blindness, difficulty walking, depression, unusual weight change, abnormal bleeding, enlarged lymph nodes, angioedema, breast masses, and testicular masses.    Physical Exam  General:  Well-developed,well-nourished,in no acute distress; alert,appropriate and cooperative throughout examination Lungs:  Normal respiratory effort, chest expands symmetrically. Lungs are clear to auscultation, no crackles or wheezes. Heart:  Normal rate and regular rhythm. S1 and S2 normal without gallop, murmur, click, rub or  other extra sounds. Psych:  Cognition and judgment appear intact. Alert and cooperative with normal attention span and concentration. No apparent delusions, illusions, hallucinations   Impression & Recommendations:  Problem # 1:  DIABETES MELLITUS, TYPE II (ICD-250.00)  The following medications were removed from the medication list:    Glyburide-metformin  5-500 Mg Tabs (Glyburide-metformin) .Marland Kitchen... 1 bid His updated medication list for this problem includes:    Aspir-low 81 Mg Tbec (Aspirin) .Marland Kitchen... 1 every other day    Lisinopril 20 Mg Tabs (Lisinopril) ..... Once daily    Glyburide 5 Mg Tabs (Glyburide) .Marland Kitchen..Marland Kitchen Two times a day    Metformin Hcl 1000 Mg Tabs (Metformin hcl) .Marland Kitchen..Marland Kitchen Two times a day  Problem # 2:  ANXIETY STATE, UNSPECIFIED (ICD-300.00)  His updated medication list for this problem includes:    Clonazepam 1 Mg Tabs (Clonazepam) .Marland Kitchen..Marland Kitchen Two times a day  Complete Medication List: 1)  Multivitamins Tabs (Multiple vitamin) .Marland Kitchen.. 1 by mouth once daily 2)  Aspir-low 81 Mg Tbec (Aspirin) .Marland Kitchen.. 1 every other day 3)  Freestyle Test Strp (Glucose blood) .... Use as directed 4)  Flaxseed Oil 1200 Mg Caps (Flaxseed (linseed)) .... 2 every am 5)  Fish Oil 1000 Mg Caps (Omega-3 fatty acids) .... 2 tabs every am 6)  Diclofenac Sodium 50 Mg Tbec (Diclofenac sodium) .... Three times a day as needed pain 7)  Crestor 10 Mg Tabs (Rosuvastatin calcium) .Marland Kitchen.. 1 once daily 8)  Niaspan 500 Mg Cr-tabs (Niacin (antihyperlipidemic)) .... 1/2 at bedtime 9)  Vicodin 5-500 Mg Tabs (Hydrocodone-acetaminophen) .Marland Kitchen.. 1 q 6 hours as needed pain 10)  Flexeril 10 Mg Tabs (Cyclobenzaprine hcl) .... Three times a day as needed spasm 11)  Bystolic 5 Mg Tabs (Nebivolol hcl) .Marland Kitchen.. 1 by mouth once daily 12)  Lisinopril 20 Mg Tabs (Lisinopril) .... Once daily 13)  Glyburide 5 Mg Tabs (Glyburide) .... Two times a day 14)  Metformin Hcl 1000 Mg Tabs (Metformin hcl) .... Two times a day 15)  Clonazepam 1 Mg Tabs (Clonazepam) .... Two times a day  Other Orders: Admin 1st Vaccine (40102) Flu Vaccine 56yrs + (72536)  Patient Instructions: 1)  Increase the diabetes meds as above. Add Januvia and Clonazepam.  Prescriptions: CLONAZEPAM 1 MG TABS (CLONAZEPAM) two times a day  #60 x 5   Entered and Authorized by:   Nelwyn Salisbury MD   Signed by:   Nelwyn Salisbury MD on 06/09/2010    Method used:   Print then Give to Patient   RxID:   [id number] METFORMIN HCL 1000 MG TABS (METFORMIN HCL) two times a day  #60 x 11   Entered and Authorized by:   Nelwyn Salisbury MD   Signed by:   Nelwyn Salisbury MD on 06/09/2010   Method used:   Print then Give to Patient   RxID:   [id number] GLYBURIDE 5 MG TABS (GLYBURIDE) two times a day  #60 x 11   Entered and Authorized by:   Nelwyn Salisbury MD   Signed by:   Nelwyn Salisbury MD on 06/09/2010   Method used:   Print then Give to Patient   RxID:   [id number]    Orders Added: 1)  Est. Patient Level IV [22025] 2)  Admin 1st Vaccine [90471] 3)  Flu Vaccine 84yrs + [42706]    Laboratory Results   Blood Tests    Date/Time Reported: Ami Bullins CMA  June 09, 2010 11:39 AM   CBG Random::  274mg /dL    Flu Vaccine Consent Questions     Do you have a history of severe allergic reactions to this vaccine? no    Any prior history of allergic reactions to egg and/or gelatin? no    Do you have a sensitivity to the preservative Thimersol? no    Do you have a past history of Guillan-Barre Syndrome? no    Do you currently have an acute febrile illness? no    Have you ever had a severe reaction to latex? no    Vaccine information given and explained to patient? yes    Are you currently pregnant? no    Lot Number:[id number]   Exp Date:01/03/2011   Site Given  Left Deltoid IM   .lbflu

## 2010-08-05 NOTE — Assessment & Plan Note (Signed)
Summary: FEVER, CHILLS, HTN, HYPERGLYCEMIA CONCERNS // RS   Vital Signs:  Patient profile:   60 year old male Weight:      222 pounds O2 Sat:      98 % Temp:     98.3 degrees F Pulse rate:   65 / minute BP sitting:   140 /   Vitals Entered By: Pura Spice, RN (March 13, 2010 11:24 AM) CC: urinary frequency burning BS going up in afternoon.  Is Patient Diabetic? Yes   History of Present Illness: Here for 3 days of lower abdominal pressure, burning on urination, and urgency to urinate. No fever. He has been nauseated but has not vomitted. He drinks plenty of water. He had a prostate infection similar to this 6 months ago which responded well to Bactrim.   Allergies: 1)  ! Codeine 2)  ! * Ambien 3)  ! Cipro  Past History:  Past Medical History: Reviewed history from 08/28/2008 and no changes required. Coronary artery disease, sees Dr. Tresa Endo Hyperlipidemia Hypertension Diabetes mellitus, type II insomnia chronic HA, sees [facility] at [facility]  Past Surgical History: Reviewed history from 08/28/2008 and no changes required. Appendectomy Colonoscopy 04/02/06 per Dr. Leone Payor, repeat in 10 yrs Coronary artery bypass graft, 4 vessels 2002 normal stress test 02-21-03  Review of Systems  The patient denies anorexia, fever, weight loss, weight gain, vision loss, decreased hearing, hoarseness, chest pain, syncope, dyspnea on exertion, peripheral edema, prolonged cough, headaches, hemoptysis, melena, hematochezia, severe indigestion/heartburn, hematuria, incontinence, genital sores, muscle weakness, suspicious skin lesions, transient blindness, difficulty walking, depression, unusual weight change, abnormal bleeding, enlarged lymph nodes, angioedema, breast masses, and testicular masses.    Physical Exam  General:  Well-developed,well-nourished,in no acute distress; alert,appropriate and cooperative throughout examination Abdomen:  Bowel sounds positive,abdomen  soft and non-tender without masses, organomegaly or hernias noted.   Impression & Recommendations:  Problem # 1:  ACUTE PROSTATITIS (ICD-601.0)  Orders: UA Dipstick w/o Micro (manual) (24401) T-Culture, Urine (02725-36644)  Complete Medication List: 1)  Multivitamins Tabs (Multiple vitamin) .Marland Kitchen.. 1 by mouth once daily 2)  Aspir-low 81 Mg Tbec (Aspirin) .Marland Kitchen.. 1 every other day 3)  Freestyle Test Strp (Glucose blood) .... Use as directed 4)  Flaxseed Oil 1200 Mg Caps (Flaxseed (linseed)) .... 2 every am 5)  Fish Oil 1000 Mg Caps (Omega-3 fatty acids) .... 2 tabs every am 6)  Glyburide-metformin 5-500 Mg Tabs (Glyburide-metformin) .Marland Kitchen.. 1 bid 7)  Diclofenac Sodium 50 Mg Tbec (Diclofenac sodium) .... Three times a day as needed pain 8)  Crestor 10 Mg Tabs (Rosuvastatin calcium) .Marland Kitchen.. 1 once daily 9)  Niaspan 500 Mg Cr-tabs (Niacin (antihyperlipidemic)) .... 1/2 at bedtime 10)  Vicodin 5-500 Mg Tabs (Hydrocodone-acetaminophen) .Marland Kitchen.. 1 q 6 hours as needed pain 11)  Flexeril 10 Mg Tabs (Cyclobenzaprine hcl) .... Three times a day as needed spasm 12)  Bystolic 5 Mg Tabs (Nebivolol hcl) .Marland Kitchen.. 1 by mouth once daily 13)  Lisinopril 20 Mg Tabs (Lisinopril) .... Once daily 14)  Bactrim Ds 800-160 Mg Tabs (Sulfamethoxazole-trimethoprim) .... Two times a day  Patient Instructions: 1)  Please schedule a follow-up appointment as needed .  Prescriptions: BACTRIM DS 800-160 MG TABS (SULFAMETHOXAZOLE-TRIMETHOPRIM) two times a day  #28 x 0   Entered and Authorized by:   Nelwyn Salisbury MD   Signed by:   Nelwyn Salisbury MD on 03/13/2010   Method used:   [facility] to        [facility]  [address] [id number]       [address]       [place], [place]  [address]       Ph: [phone]       Fax: [phone]   RxID:   [id number]   Laboratory Results   Urine Tests  Date/Time Received: March 13, 2010  Date/Time Reported: 11:37 AM  Routine Urinalysis   Color:  yellow Appearance: Clear Glucose: >=1000   (Normal Range: Negative) Bilirubin: negative   (Normal Range: Negative) Ketone: negative   (Normal Range: Negative) Spec. Gravity: 1.010   (Normal Range: 1.003-1.035) Blood: trace-lysed   (Normal Range: Negative) pH: 5.0   (Normal Range: 5.0-8.0) Protein: trace   (Normal Range: Negative) Urobilinogen: 0.2   (Normal Range: 0-1) Nitrite: negative   (Normal Range: Negative) Leukocyte Esterace: moderate   (Normal Range: Negative)    Comments: Pura Spice, RN  March 13, 2010 11:38 AM

## 2010-08-05 NOTE — Assessment & Plan Note (Signed)
Summary: F/U FOR FLU-LIKE SXS - RASH // RS   Vital Signs:  Patient profile:   60 year old male Weight:      213 pounds BMI:     30.67 Temp:     98.3 degrees F oral Pulse rate:   70 / minute Pulse rhythm:   regular BP sitting:   122 / 80  (left arm) Cuff size:   regular  Vitals Entered By: Raechel Ache, RN (September 09, 2009 9:49 AM) CC: Recent stomach virus- better but unable to eat and is broken out with red rash all over since Sat. FBS 104. Is Patient Diabetic? Yes   History of Present Illness: Here for a series of problems. He was here on 09-03-09 for an early prostate infection, and I started him on a 14 day course of Cipro. However after taking this for 4 days, he broke out with a diffuse rash over the arms, legs, and trunk so he stopped taking it. Then yesterday he had the sudden onset of vomitting and diarrhea, but no fever. Today the NVD has stopped but he is still crampy and weak. Able to drink fluids.   Allergies: 1)  ! Codeine 2)  ! Crestor (Rosuvastatin Calcium) 3)  ! * Ambien 4)  ! Cipro  Past History:  Past Medical History: Reviewed history from 08/28/2008 and no changes required. Coronary artery disease, sees Dr. Tresa Endo Hyperlipidemia Hypertension Diabetes mellitus, type II insomnia chronic HA, sees Dr. Neale Burly at [facility]  Review of Systems  The patient denies anorexia, fever, weight loss, weight gain, vision loss, decreased hearing, hoarseness, chest pain, syncope, dyspnea on exertion, peripheral edema, prolonged cough, headaches, hemoptysis, melena, hematochezia, severe indigestion/heartburn, hematuria, incontinence, genital sores, muscle weakness, suspicious skin lesions, transient blindness, difficulty walking, depression, unusual weight change, abnormal bleeding, enlarged lymph nodes, angioedema, breast masses, and testicular masses.    Physical Exam  General:  Well-developed,well-nourished,in no acute distress; alert,appropriate and cooperative  throughout examination Lungs:  Normal respiratory effort, chest expands symmetrically. Lungs are clear to auscultation, no crackles or wheezes. Heart:  Normal rate and regular rhythm. S1 and S2 normal without gallop, murmur, click, rub or other extra sounds. Abdomen:  soft, normal bowel sounds, no distention, no masses, no guarding, no rigidity, no rebound tenderness, no abdominal hernia, no hepatomegaly, and no splenomegaly.  Mildly tender diffusely.  Skin:  splotchy red maculopapular rash as above   Impression & Recommendations:  Problem # 1:  GASTROENTERITIS, VIRAL (ICD-008.8)  Problem # 2:  ADVERSE DRUG REACTION (ICD-995.20)  Problem # 3:  ACUTE PROSTATITIS (ICD-601.0)  Complete Medication List: 1)  Multivitamins Tabs (Multiple vitamin) .Marland Kitchen.. 1 by mouth once daily 2)  Aspir-low 81 Mg Tbec (Aspirin) .Marland Kitchen.. 1 every other day 3)  Metoprolol Tartrate 50 Mg Tabs (Metoprolol tartrate) .... 1/2  by mouth two times a day 4)  Lisinopril 10 Mg Tabs (Lisinopril) .... Once daily 5)  Freestyle Test Strp (Glucose blood) .... Use as directed 6)  Flaxseed Oil 1200 Mg Caps (Flaxseed (linseed)) .... 2 every am 7)  Fish Oil 1000 Mg Caps (Omega-3 fatty acids) .... 2 tabs every am 8)  Glyburide-metformin 5-500 Mg Tabs (Glyburide-metformin) .Marland Kitchen.. 1 bid 9)  Diclofenac Sodium 50 Mg Tbec (Diclofenac sodium) .... Three times a day as needed pain 10)  Bactrim Ds 800-160 Mg Tabs (Sulfamethoxazole-trimethoprim) .... Two times a day  Patient Instructions: 1)  Stay off Cipro. Use Benadryl as needed . Switch to Bactrim DS for 10 days  to treat the prostatitis. The GI virus seems to be over, but he will drink fluids and slowly advance his diet for several days.  2)  Please schedule a follow-up appointment as needed .  Prescriptions: BACTRIM DS 800-160 MG TABS (SULFAMETHOXAZOLE-TRIMETHOPRIM) two times a day  #20 x 0   Entered and Authorized by:   Nelwyn Salisbury MD   Signed by:   Nelwyn Salisbury MD on 09/09/2009   Method  used:   [facility]        [facility] [phone][facility] (retail)       [address]       [place], [place]  [address]       Ph: [phone]       Fax: [phone]   RxID:   [id number]

## 2010-08-05 NOTE — Letter (Signed)
Summary: [facility]  [facility]   Imported By: Maryln Gottron 01/17/2010 11:08:54  _____________________________________________________________________  External Attachment:    Type:   Image     Comment:   External Document

## 2010-08-05 NOTE — Progress Notes (Signed)
Summary: rx lisinopril   Phone Note Call from Patient   Caller: Spouse Summary of Call: [facility]opril  [facility] Initial call taken by: Pura Spice, RN,  May 21, 2010 3:44 PM  Follow-up for Phone Call        done  pt aware. Follow-up by: Pura Spice, RN,  May 21, 2010 3:46 PM    Prescriptions: LISINOPRIL 20 MG TABS (LISINOPRIL) once daily  #30 x 6   Entered by:   Pura Spice, RN   Authorized by:   Nelwyn Salisbury MD   Signed by:   Pura Spice, RN on 05/21/2010   Method used:   [facility]        [facility] [facility] [address] [phone]       [address]       [place], [place]  [address]       Ph: [phone]       Fax: [phone]   RxID:   [id number]

## 2010-08-05 NOTE — Progress Notes (Signed)
Summary: please return call  Phone Note Call from Patient Call back at [phone] or [phone]   Caller: Spouse-----voicemail Summary of Call: Has the flu , chills and fever. Please return call. Wife had the same thing. Initial call taken by: Warnell Forester,  September 03, 2009 10:00 AM  Follow-up for Phone Call        Phone Call Completed, Appt Scheduled Today Follow-up by: Alfred Levins, CMA,  September 03, 2009 10:10 AM

## 2010-08-05 NOTE — Consult Note (Signed)
Summary: [facility]  [facility]   Imported By: Maryln Gottron 01/22/2010 12:52:42  _____________________________________________________________________  External Attachment:    Type:   Image     Comment:   External Document

## 2010-08-05 NOTE — Letter (Signed)
Summary: [facility]  [facility]   Imported By: Maryln Gottron 05/19/2010 15:39:18  _____________________________________________________________________  External Attachment:    Type:   Image     Comment:   External Document

## 2010-08-05 NOTE — Assessment & Plan Note (Signed)
Summary: DIABETIC F/U // RS   Vital Signs:  Patient profile:   60 year old male Weight:      226 pounds BMI:     32.54 O2 Sat:      98 % on Room air Temp:     97.7 degrees F oral Pulse rate:   70 / minute Pulse rhythm:   regular BP sitting:   126 / 78  (left arm) Cuff size:   regular  Vitals Entered By: Raechel Ache, RN (October 16, 2009 2:21 PM)  O2 Flow:  Room air CC: C/o SOB, cough and no energy. Wife concerned. Also pain L side neck and L fingers get numb.  B's about 100.    History of Present Illness: Here for several isuues, primarily for fatigue. His wife reports that he complains of being tired all the time. No other specific symptoms, but she thinks he works too much, between a full time job with [employer] and serving as Education officer, environmental of a church. He says his glucoses are usually in the range of 100-150, but his A1c was 8.0 last December. Also he has diffuse joint pains, and these bother him mostly at night. Diclofenac helps a bit  Allergies: 1)  ! Codeine 2)  ! Crestor (Rosuvastatin Calcium) 3)  ! * Ambien 4)  ! Cipro  Past History:  Past Medical History: Reviewed history from 08/28/2008 and no changes required. Coronary artery disease, sees Dr. Tresa Endo Hyperlipidemia Hypertension Diabetes mellitus, type II insomnia chronic HA, sees [facility]  Past Surgical History: Reviewed history from 08/28/2008 and no changes required. Appendectomy Colonoscopy 04/02/06 per Dr. Leone Payor, repeat in 10 yrs Coronary artery bypass graft, 4 vessels 2002 normal stress test 02-21-03  Review of Systems  The patient denies anorexia, fever, weight loss, weight gain, vision loss, decreased hearing, hoarseness, chest pain, syncope, dyspnea on exertion, peripheral edema, prolonged cough, headaches, hemoptysis, abdominal pain, melena, hematochezia, severe indigestion/heartburn, hematuria, incontinence, genital sores, muscle weakness, suspicious skin lesions,  transient blindness, difficulty walking, depression, unusual weight change, abnormal bleeding, enlarged lymph nodes, angioedema, breast masses, and testicular masses.    Physical Exam  General:  Well-developed,well-nourished,in no acute distress; alert,appropriate and cooperative throughout examination Neck:  No deformities, masses, or tenderness noted. Lungs:  Normal respiratory effort, chest expands symmetrically. Lungs are clear to auscultation, no crackles or wheezes. Heart:  Normal rate and regular rhythm. S1 and S2 normal without gallop, murmur, click, rub or other extra sounds.   Impression & Recommendations:  Problem # 1:  FATIGUE (ICD-780.79)  Problem # 2:  DIABETES MELLITUS, TYPE II (ICD-250.00)  His updated medication list for this problem includes:    Aspir-low 81 Mg Tbec (Aspirin) .Marland Kitchen... 1 every other day    Lisinopril 10 Mg Tabs (Lisinopril) ..... Once daily    Glyburide-metformin 5-500 Mg Tabs (Glyburide-metformin) .Marland Kitchen... 1 bid  Orders: Venipuncture (16109) TLB-BMP (Basic Metabolic Panel-BMET) (80048-METABOL) TLB-CBC Platelet - w/Differential (85025-CBCD) TLB-Hepatic/Liver Function Pnl (80076-HEPATIC) TLB-TSH (Thyroid Stimulating Hormone) (84443-TSH) TLB-A1C / Hgb A1C (Glycohemoglobin) (83036-A1C)  Problem # 3:  NECK PAIN (ICD-723.1)  His updated medication list for this problem includes:    Aspir-low 81 Mg Tbec (Aspirin) .Marland Kitchen... 1 every other day    Diclofenac Sodium 50 Mg Tbec (Diclofenac sodium) .Marland Kitchen... Three times a day as needed pain    Vicodin 5-500 Mg Tabs (Hydrocodone-acetaminophen) .Marland Kitchen... 1 q 6 hours as needed pain  Problem # 4:  HYPERTENSION (ICD-401.9)  His updated medication list for  this problem includes:    Metoprolol Tartrate 50 Mg Tabs (Metoprolol tartrate) .Marland Kitchen... 1/2  by mouth two times a day    Lisinopril 10 Mg Tabs (Lisinopril) ..... Once daily  Complete Medication List: 1)  Multivitamins Tabs (Multiple vitamin) .Marland Kitchen.. 1 by mouth once daily 2)   Aspir-low 81 Mg Tbec (Aspirin) .Marland Kitchen.. 1 every other day 3)  Metoprolol Tartrate 50 Mg Tabs (Metoprolol tartrate) .... 1/2  by mouth two times a day 4)  Lisinopril 10 Mg Tabs (Lisinopril) .... Once daily 5)  Freestyle Test Strp (Glucose blood) .... Use as directed 6)  Flaxseed Oil 1200 Mg Caps (Flaxseed (linseed)) .... 2 every am 7)  Fish Oil 1000 Mg Caps (Omega-3 fatty acids) .... 2 tabs every am 8)  Glyburide-metformin 5-500 Mg Tabs (Glyburide-metformin) .Marland Kitchen.. 1 bid 9)  Diclofenac Sodium 50 Mg Tbec (Diclofenac sodium) .... Three times a day as needed pain 10)  Crestor 10 Mg Tabs (Rosuvastatin calcium) .Marland Kitchen.. 1 once daily 11)  Niaspan 500 Mg Cr-tabs (Niacin (antihyperlipidemic)) .... 1/2 at bedtime 12)  Vicodin 5-500 Mg Tabs (Hydrocodone-acetaminophen) .Marland Kitchen.. 1 q 6 hours as needed pain  Patient Instructions: 1)  get labs today Prescriptions: VICODIN 5-500 MG TABS (HYDROCODONE-ACETAMINOPHEN) 1 q 6 hours as needed pain  #60 x 2   Entered and Authorized by:   Nelwyn Salisbury MD   Signed by:   Nelwyn Salisbury MD on 10/16/2009   Method used:   Print then Give to Patient   RxID:   [id number]

## 2010-08-05 NOTE — Progress Notes (Signed)
   Request for Records recieved from DDS sent to [facility]  January 20, 2010 7:53 AM

## 2010-08-05 NOTE — Progress Notes (Signed)
Summary: lab results.  Phone Note Call from Patient   Caller: Spouse Call For: Thomas Salisbury MD Reason for Call: Lab or Test Results Summary of Call: Needs lab results called to a different phone, please.  [phone] Initial call taken by: Lynann Beaver CMA,  October 21, 2009 9:19 AM  Follow-up for Phone Call        Phone Call Completed Follow-up by: Raechel Ache, RN,  October 21, 2009 9:27 AM

## 2010-08-05 NOTE — Progress Notes (Signed)
Summary: results needed  Phone Note Call from Patient Call back at [phone] or [phone]   Caller: [facility] CALL Reason for Call: Lab or Test Results Summary of Call: would like lab results. Initial call taken by: Warnell Forester,  July 11, 2009 3:22 PM  Follow-up for Phone Call        his diabetes is not well controlled. Watch strict diet and use meds. See me in one month Follow-up by: Nelwyn Salisbury MD,  July 12, 2009 1:55 PM  Additional Follow-up for Phone Call Additional follow up Details #1::        pt aware Additional Follow-up by: Alfred Levins, CMA,  July 12, 2009 4:37 PM

## 2010-09-18 ENCOUNTER — Other Ambulatory Visit: Payer: Self-pay | Admitting: Family Medicine

## 2010-11-03 ENCOUNTER — Other Ambulatory Visit: Payer: Self-pay | Admitting: *Deleted

## 2010-11-03 NOTE — Telephone Encounter (Signed)
Pt wants to change to generic Crestor.  Please call [facility] ([place])

## 2010-11-04 NOTE — Telephone Encounter (Signed)
Crestor is not available in a generic

## 2010-11-05 NOTE — Telephone Encounter (Signed)
Pt informed that there is no generic for Crestor//patent does not expire until January 2016.

## 2010-11-18 NOTE — Letter (Signed)
June 02, 2010     RE:  [name], [name]  MRN:  [id number]  /  DOB:  1951-01-12   To whom it may concern:   This letter is concerning a patient of mine by the name of Thomas H.  Bennett (Date of birth [birth year]).  This letter will serve as my  certification of this patient's status as permanently and totally  disabled.  I have served as [name]'s primary care physician for  many years, and I am quite familiar with his medical problems, which are  many.  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  He is  status post coronary artery bypass grafting surgery, and his ability to  work is severely limited by all of these issues.  He is receiving  ongoing therapy from a neurosurgeon, a physical medicine and  rehabilitation specialist, and a pain management specialist.  All these  issues make it impossible for him to have any type of gainful  employment, and I do feel that he is totally and permanently disabled  from working.   If I may be of further assistance, please let me know.    Sincerely,      Tera Mater. Clent Ridges, MD  Electronically Signed    SAF/MedQ  DD: 06/02/2010  DT: 06/02/2010  Job #: [id number]

## 2010-11-21 NOTE — Cardiovascular Report (Signed)
[place]. [facility]  Patient:    [name], [name]                    MRN: [id number] Proc. Date: 12/13/00 Adm. Date:  [id number] Attending:  Talitha Givens CC:         Luis Abed, M.D. [facility]  Cardiac Catheterization Laboratory   Cardiac Catheterization  PROCEDURES PERFORMED:  Left heart catheterization with coronary angiography and left ventriculography.  INDICATIONS:  [name] is a 60 year old male, admitted with unstable angina.  PROCEDURAL NOTE:  A 6-French sheath was placed in the right femoral artery. Standard Judkins 6-French catheters were utilized.  Contrast was Omnipaque. There were no complications.  RESULTS:  HEMODYNAMICS:  Left ventricular pressure 160/20.  Aortic pressure 155/94. There is no aortic valve gradient.  LEFT VENTRICULOGRAM:  Wall motion is normal.  Ejection fraction is calculated at 74%.  No mitral regurgitation.  CORONARY ARTERIOGRAPHY (LEFT DOMINANT): Left main is normal.  Left anterior descending artery has a diffuse 60% stenosis in the mid vessel extending across a large septal perforator and a normal sized second diagonal branch.  Just beyond the second diagonal branch in the LAD is an 80% stenosis. The LAD gives rise to a very large branching first diagonal which functions as an intermediate branch supplying the lateral wall.  In the main trunk of this first diagonal is a diffuse 60% stenosis extending across two subbranches. The first of these subbranches is a relatively large vessel and has a 95% stenosis at its ostium.  The second of these subbranches is smaller in size and also has a 95% stenosis at its ostium.  The second diagonal branch has a 70% stenosis at its origin.  Left circumflex has a 30% stenosis at its origin and a 30% stenosis distally. It gives rise to small first and second obtuse marginal branches and a large third obtuse marginal branch.  Right coronary artery is a dominant  vessel.  It gives rise to a large acute marginal supplying the inferior septum.  There is a small posterior descending artery and a normal sized posterolateral branch.  There is a 40% to 50% stenosis at the ostium of the right coronary artery.  The mid vessel has a 30% stenosis and distal vessel has a diffuse 25% stenosis.  IMPRESSIONS: 1. Normal left ventricular systolic function. 2. Complex two-vessel coronary artery disease.  PLAN:  The patient will be referred for evaluation for coronary artery bypass surgery. DD:  12/13/00 TD:  12/13/00 Job: [id number]

## 2010-11-21 NOTE — Op Note (Signed)
[place]  Patient:    [name], [name]                    MRN: [id number] Proc. Date: 12/14/00 Adm. Date:  [id number] Attending:  Charlett Lango CC:         Luis Abed, M.D. [facility]   Operative Report  PREOPERATIVE DIAGNOSIS:  Severe complex single-vessel coronary disease with unstable angina.  POSTOPERATIVE DIAGNOSIS:  Severe complex single-vessel coronary disease with unstable angina.  OPERATION: 1. Median sternotomy. 2. Extracorporeal circulation. 3. Coronary artery bypass grafting x 4 (sequential left internal mammary    artery to second diagonal and left anterior descending artery, sequential    left radial artery to first and third branches of the first diagonal).  SURGEON:  [name]. [name], M.D.  ASSISTANT:  1. [name]. Cornelius Moras, M.D.             2. Tollie Pizza. Thomasena Edis, P.A.  ANESTHESIA:  General.  FINDINGS:  Good quality targets, good quality conduits, preserved left ventricular function.  CLINICAL NOTE:  [name] is a 60 year old gentleman with a strong family history of coronary disease.  He presented with acute onset of substernal chest pain and was found to have unstable angina.  He underwent cardiac catheterization which revealed severe complex disease in the LAD system, mild disease in the right system, and only luminal irregularities in the circumflex.  Overall left ventricular function was preserved.  Because the LAD disease was complex and not amenable to percutaneous intervention, the patient was referred for coronary artery bypass grafting.  The indication, risks, benefits, and alternatives were discussed in detail with the patient and his family.  They understood and accepted the risks and agreed to proceed.  DESCRIPTION OF PROCEDURE:  [name] was brought to the preop holding area on December 14, 2000.  Lines were placed to monitor arterial, central venous, and pulmonary arterial pressure.  EKG leads  were placed for continuous telemetry. The patient was taken to the operating room, anesthetized, and intubated.  A Foley catheter was placed.  Intravenous antibiotics were administered.  The chest, abdomen, and the left arm were prepped and draped in the usual fashion.  The incision was made over the radial artery at the left wrist.  This was approximately 10 cm long. It was carried through the skin and subcutaneous tissue.  The overlying fascia was incised, and the radial artery was exposed. Initially a short segment was dissected out distally.  The patient had normal Allen test preoperatively.  Branches were doubly clipped and divided.  After mobilizing an adequate segment of the vessel, a soft bulldog clamp was placed over the radial artery.  There was a good pulse palpable distally.  The bulldog clamp was removed, and the remainder of the radial harvest was done using standard technique with a lazy S incision on the forearm.  Minimal electrocautery was used in dissection.  After freeing up the entire length of the radial, it was divided distally.  There was good flow through the graft. It then was divided proximally.  Both ends were suture ligated.  The mammary was placed in a heparinized butaverine and saline solution.  A median sternotomy then was performed.  The left internal mammary artery was harvested in the standard fashion.  Again, branches were doubly clipped and divided.  The mammary was a 2.5 mm good quality vessel.  The patient was fully heparinized.  The distal end of the mammary artery  was divided.  There was excellent flow.  The mammary was placed in butaverine silk sponge and placed into the left pleural space.  The pericardium was opened.  The ascending aorta was inspected and palpated. There was no palpable atherosclerotic disease.  The aorta was cannulated via concentric 2-0 Ethibond non-pledgeted pursestring sutures.  A dual stage venous cannula was placed via  pursestring suture in the right atrial appendage.  Cardiopulmonary bypass was instituted, and the patient was cooled to 32 degrees C.  The coronary arteries were inspected and anastomotic sites were chosen.   The conduits were inspected and cut to length.  A foam pad was placed in the pericardium to protect the left phrenic nerve.  A temperature probe was placed in the myocardial septum, and a cardioplegia cannula was placed in the ascending aorta.  The aorta was crossclamped.  The left ventricle was emptied via the aortic root vent.  Cardiac arrest then was achieved with a combination of cold antegrade blood, cardioplegia, and topical iced saline; 600 cc of cardioplegia was administered.  The myocardial septal temperature was 11 degrees C.  The following distal anastomoses were performed.  First, the left radial artery was anastomosed sequentially to two branches of a large first diagonal anterolateral branch off the LAD.  This was equivalent to a ramus intermedius branch.  It gave off a large side branch which was intramyocardial.  A side-to-side anastomosis was performed from the radial to this side branch using a running 8-0 Prolene suture.  Next, and end-to-side anastomosis was performed to the main branch of the first diagonal more distally.  This anastomosis also was performed with a running 8-0 Prolene suture.  At the completion of the second anastomosis, additional cardioplegia was administered via the aorta root.  There was good backbleeding via the anastomosis.  A soft bulldog clamp was placed on the radial artery.  The anastomoses were inspected for hemostasis which was good.  Next, the left internal mammary artery was brought through a window in the pericardium.  The distal end was spatulated.  A longitudinal arteriotomy was made.  This site would overly the second diagonal branch of the LAD.  The second diagonal branch was a 1.5 mm good quality target, and  side-to-side anastomosis was performed with a running 8-0 Prolene suture.  On completion of  the anastomosis, the bulldog clamp was briefly removed.  There was some bleeding from the heel which was repaired with an 8-0 Prolene suture.  Bulldog clamp was replaced.  A 1.5 mm probe passed easily from the distal end beyond the side-to-side anastomosis in the radial graft.  The distal end of the left internal mammary artery then was anastomosed to the distal LAD.  The LAD was a 1.5 mm vessel at the site of the anastomosis but tapered to a 1 mm vessel just beyond that.  More proximally, the LAD was intramyocardial.  The left internal mammary artery then was anastomosed end-to-side with a running 8-0 Prolene suture.  At the completion of this anastomosis, the bulldog clamp was again removed.  Immediate and rapid septal rewarming was noted.  Lidocaine was administered.  The mammary pedicle was tacked above the surface of the heart with 6-0 Prolene sutures.  There was good hemostasis at both anastomoses.  The left radial graft was cut to length proximally.  The cardioplegia cannula was removed from the ascending aorta.  The aorta was normal caliber, thin wall. The proximal left radial anastomosis was performed to a 4.0 mm  punch aortotomy with a running 7-0 Prolene suture.  Deairing was performed, and the aortic crossclamp was removed.  Total crossclamp time was 65 minutes.  The patient spontaneously resumed rhythm and did not require defibrillation.  Proximal and distal anastomoses were inspected for hemostasis.  The patient was rewarmed.  Epicardial pacing wires were placed on the right ventricle and right atrium.  When the core temperature reached 37 degrees C, the patient was weaned from cardiopulmonary bypass without difficulty.  The total bypass time was 115 minutes.  The initial cardiac index was greater than 2 liters per meters squared, in sinus rhythm, with no inotropic support.  The patient remained hemodynamically stable throughout the past bypass period.  A test dose of protamine was administered and was well tolerated.  The atrial and aortic cannulae were removed.  The remainder of the protamine was administered without incident.  The chest was irrigated with 1 liter warm normal saline containing 1 g of vancomycin.  Hemostasis was achieved.  A left pleural and two mediastinal chest tubes were placed through separate subcostal incisions.  The pericardium was partially reapproximated with 3-0 silk sutures in the skin easily tension on the underlying grafts or tension on the heart. The sternum was closed with heavy gauge interrupted stainless steel wires. The pectoralis fascia was closed with running #1 Vicryl suture.  Subcutaneous tissue was closed with running 2-0 Vicryl suture, and the skin was closed with a 3-0 Vicryl subcuticular suture.  The arm incision was closed in two layers. The fascia was not closed.  The subcutaneous tissue was closed with running 2-0 Vicryl suture, and the skin was closed with a running 3-0 Vicryl sutures. All sponge, needle, and instrument counts were correct a the end of the procedure.  The patient remained hemodynamically stable and was taken from the operating room to the surgical intensive care unit in stable condition. DD:  12/14/00 TD:  12/14/00 Job: [id number]

## 2010-11-21 NOTE — Discharge Summary (Signed)
[name], [name]                       ACCOUNT NO.:  [id number]   MEDICAL RECORD NO.:  [id number]                   PATIENT TYPE:  INP   LOCATION:  3307                                 FACILITY:  [facility]   PHYSICIAN:  Rene Paci, M.D. [facility]          DATE OF BIRTH:  16-Dec-1950   DATE OF ADMISSION:  01/31/2003  DATE OF DISCHARGE:  02/02/2003                                 DISCHARGE SUMMARY   DISCHARGE DIAGNOSES:  Chest pain.   PROCEDURE:  Cardiac catheterization.   BRIEF ADMISSION HISTORY:  [name] is a 60 year old white male with a  known history of coronary artery disease.  He describes onset of substernal  chest discomfort described as a smothering sensation.  This occurred around  2:00 in the afternoon.  He says this discomfort was reminiscent of the pain  he had in 2002 prior to his bypass surgery.   PAST MEDICAL HISTORY:  1. Coronary artery disease status post CABG x2 in June of 2002.  2. History of black lung.  3. Hypertension.  4. Remote tobacco.   HOSPITAL COURSE:  Problem 1 - CARDIOVASCULAR:  The patient was admitted with  chest pain to rule out myocardial infarction.  The patient was started on  heparin.  Serial cardiac enzymes were obtained.  His cardiac enzymes were  negative.  Cardiology saw the patient and recommended cardiac  catheterization.  This was performed on February 01, 2003 by [name],  M.D.  The patient had patent LIMA to the D1 and LAD but vessels were quite  small and he noted potential for upstream ischemia.  The patient did have  preserved LV systolic function.  [name], M.D. suspected his  dyspnea and chest pain was noncardiac.  He recommended an outpatient  Cardiolite to compare with a study done one year previously.  The patient  was felt to be stable from a cardiology standpoint for discharge home.  The  patient will be scheduled for a follow-up treadmill in about two weeks and  then follow up with Willa Rough,  M.D. after that.  We have empirically  started the patient on a proton pump inhibitor to rule out underlying reflux  disease.  Will have him follow up with his primary care physician in about  three weeks to see if his symptoms have improved.  Currently, the patient is  pain-free and has not had any further chest pain.   DISCHARGE LABORATORIES:  Hemoglobin A1C 6.2%.  CBC with differential was  normal.  BMET was normal except for a glucose of 136.  Fasting lipid profile  was still pending.   DISCHARGE MEDICATIONS:  1. Metoprolol 25 mg b.i.d.  2. Aspirin 325 mg daily.  3. Protonix 40 mg daily.    FOLLOWUP:  Follow up with Jeannett Senior A. Clent Ridges, M.D. [facility] in approximately three  weeks.  Follow-up for a treadmill Cardiolite and follow-up with Willa Rough, M.D. to be  scheduled by cardiology.      Cornell Barman, P.A. [facility]                  Rene Paci, M.D. [facility]    LC/MEDQ  D:  02/02/2003  T:  02/02/2003  Job:  [id number]   cc:   Tera Mater. Clent Ridges, M.D. [facility]   Willa Rough, M.D.

## 2010-11-21 NOTE — H&P (Signed)
[name], [name]                       ACCOUNT NO.:  [id number]   MEDICAL RECORD NO.:  [id number]                   PATIENT TYPE:  INP   LOCATION:  1826                                 FACILITY:  [facility]   PHYSICIAN:  Bruce Rexene Edison. Swords, M.D. [facility]           DATE OF BIRTH:  September 07, 1950   DATE OF ADMISSION:  01/31/2003  DATE OF DISCHARGE:                                HISTORY & PHYSICAL   CHIEF COMPLAINT:  Chest pain.   HISTORY OF PRESENT ILLNESS:  Mr. Rayman is a 60 year old male with a  history of known coronary disease. Approximately 2:00 this afternoon, he  developed substernal chest discomfort, which he describes as a smothering  sensation. He also describes the discomfort as a big bubble in my chest.  He says the discomfort is reminiscent of the pain he had back in 2002, when  he had his bypass surgery. He admits to some nervous feeling with the chest  discomfort but no specific shortness of breath, diaphoresis or nausea. He  had some nitroglycerin at home. He took one with significant relief. He took  another nitroglycerin on arrival to the emergency department with complete  relief of symptoms.   PAST MEDICAL HISTORY:  Significant for coronary artery disease status post  bypass surgery in 2002. He has a history of hypertension.   MEDICATIONS:  Only Metoprolol 25 mg p.o. b.i.d. and aspirin 81 mg p.o.  daily.   ALLERGIES:  CODEINE (nausea and vomiting).   SOCIAL HISTORY:  He is married. He is the pastor of a church. He is a  nonsmoker (quit 30 years ago.). They have three healthy children. He does  not drink alcohol.   FAMILY HISTORY:  Father deceased with cirrhosis at age 71. Mother deceased  with heart trouble at the age of 26. He had five brothers, one deceased with  a brain tumor. One has known coronary disease, AAA, and bladder cancer. He  has another brother deceased with head and neck cancer. He had five sisters,  one with hypertension.   REVIEW OF SYSTEMS:   He denies any shortness of breath, PND, abdominal pain,  change in appetite, change in bowel movements, lower extremity edema,  rashes, neurologic deficits, or any other complaints in review of systems.   PHYSICAL EXAMINATION:  VITAL SIGNS:  On arrival to the emergency department,  blood pressure initially 169/108, respiratory rate 20, heart rate 64,  temperature 97.4. At the time of my examination, blood pressure 108/70,  respiratory rate 14, and heart rate 60.  GENERAL:  Appears as a well-developed, well-nourished male in no acute  distress.  HEENT:  Atraumatic, normocephalic. Extraocular muscles intact.  NECK:  Supple without lymphadenopathy, thyromegaly, jugular venous  distention, or carotid bruits.  CHEST:  Clear to auscultation without any increased work of breathing.  CARDIAC:  S1 and S2 are normal without murmurs or gallops. He has a mid  sternal scar.  ABDOMEN:  Active bowel sounds. Soft, nontender. There is no  hepatosplenomegaly. No masses are palpated.  EXTREMITIES:  There is no clubbing, cyanosis, or edema. Peripheral pulses  are normal bilaterally.  NEUROLOGICAL:  He is alert and oriented without any motor or sensory  deficits.   LABORATORY DATA:  CBC normal with hemoglobin 15.3, white count 7.7. Sodium  142, potassium 3.3, glucose 142. Cardiac markers are negative with myoglobin  126, CK-MB 2.9, and Troponin-I 0.05 (less than).   EKG demonstrates normal sinus rhythm with early R-wave progression.   ASSESSMENT/PLAN:  1. Coronary artery disease with recurrent chest pain. I am concerned that     this is recurrent unstable angina. He currently is pain-free. Will     continue aspirin and beta-blocker. Will rule out for a myocardial     infarction. I think he needs intravenous heparin for the time being. I     have called Dr. Graciela Husbands, who will see the patient.  2. Hypertension, currently well controlled.  3. Cardiac risk factors. Will check a lipid panel in the morning.   4. Increased glucose. We will follow that and check an A1c.  5. Hypokalemia. Will replace with 20 mEq potassium tonight and repeat BMET     in the morning.                                                Bruce Rexene Edison Swords, M.D. [facility]    BHS/MEDQ  D:  01/31/2003  T:  02/01/2003  Job:  [id number]   cc:   Duke Salvia, M.D.

## 2010-11-21 NOTE — Assessment & Plan Note (Signed)
[name]                              CARDIOLOGY OFFICE NOTE   [name], [name]                    MRN:          [id number]  DATE:05/05/2006                            DOB:          05/28/1951    HISTORY OF PRESENT ILLNESS:  [name] is a 60 year old Caucasian male,  who has not been seen in the office since October of 2005.  The patient has  a past medical history of CAD with coronary artery bypass grafting in 2002.  The patient also has a history of myalgias secondary to statin use, and has  not been able to tolerate these medications.  The patient did have a cardiac  catheterization completed by Dr. Randa Evens in July of 2004, revealing  patent LIMA to diagonal 1 and LAD.  The vessels are quite small, potential  for upstream ischemia, occluded second limb of free re-do to ramus branches,  most severe stenosis is of greater than 2 mm vessel with preserved left  ventricular systolic function, EF of 62%.   The patient presents to the office today after having 1 week duration of  chest cold symptoms.  The patient had had a chest cold which began  approximately 1 week ago, with fever, chills, cough and congestion in his  chest and in sinuses.  The patient did not follow up with the primary care  physician and treated himself with over-the-counter Dayquil and Tylenol and  rest.  The patient has had some chest discomfort with coughing last week,  and has actually gotten better on day of this office visit.  The patient  continues to have some mild chest discomfort which he describes as achy and  tightness, which has gotten markedly better since last week as the cold has  passed.  The patient continues to take Dayquil on and off for chest  congestion and sinus pain, and has been coughing productively with yellow-  colored sputum, which apparently is improving.   Secondary to the chest soreness, which he still believes is related to his  chest cold, and urging by family members, the patient was seen in the office  today.  The patient is afebrile, but continues to have some chest soreness.  He is not coughing productively.   PHYSICAL EXAMINATION:  Blood pressure 141/91, pulse 69, respirations 18,  temperature 97.5.  HEENT:  Head is normocephalic and atraumatic.  Eyes PERRLA.  Mucous  membranes in the mouth pink and moist, tongue is midline.  Oropharyngeal  area is red without exudate seen.  NECK:  Supple without lymphadenopathy palpated.  Thyroid is within normal  limits.  CARDIOVASCULAR:  Regular rate and rhythm without murmurs, rubs or gallops.  No S3 murmur is auscultated.  LUNGS:  There are some bibasilar crackles and congestion up to the middle  lobes without wheezing or rales.  ABDOMEN:  Soft, nontender, obese, with 2+ bowel sounds.  EXTREMITIES:  Without clubbing, cyanosis or edema.  Posterior tibial pulses  are 1+ bilaterally.  SKIN:  Moist and warm.  NEUROLOGIC:  Intact.   PAST CARDIAC HISTORY:  Coronary artery bypass  grafting with sequential left  internal mammary artery to the left anterior descending artery, and the  diagonal 2 branch sequential left radial artery to the first diagonal branch  and diagonal 3 branch of the first diagonal.  Cardiac catheterization done  on December 13, 2000.   OTHER PAST HISTORY:  1. Hypertension.  2. Severe complex single vessel coronary artery disease with unstable      angina, status post coronary artery bypass grafting, as described      above.  3. Chronic obstructive pulmonary disease, black lung.   CURRENT MEDICATIONS:  1. Metoprolol 50 mg one-half tablet b.i.d.  2. Enteric-coated aspirin 81 mg once a day.  3. Multivitamin 1 p.o. daily.  4. Fish oil 1000 mg b.i.d.  5. Sleepinal 1 tab q.h.s. over-the-counter.  6. Cholest-Off over-the-counter 1 tab daily.   ALLERGIES:  CODEINE and MORPHINE, intolerant to STATINS, causing myalgias  and neurologic pain.   PLAN:   1. The patient will have a chest x-ray for evaluation of chest congestion.  2. Lab work will be completed to include a troponin, BMET, BNP and a CBC.  3. If troponin is positive for recent MI, the patient will be admitted for      further evaluation and possible cardiac catheterization.  We will      discuss this with the patient.  The patient has also been advised to      stop Dayquil, as this is not optimal for cardiac patients, as his blood      pressure is elevated and it does cause arrhythmias.  The patient will      also increase his metoprolol to 50 mg b.i.d.  If troponins are      negative, and the patient's chest x-ray is negative, we will follow up      with the patient with Dr. Myrtis Ser in the office in a couple of weeks for      further evaluation.  The possibility of starting antibiotics was      discussed with Dr. Samule Ohm, who has declined at this time unless      symptoms worsen, or he becomes febrile.      Thomas Mare. Lyman Bishop, NP      [name], MD  Electronically Signed   KML/MedQ  DD: 05/05/2006  DT: 05/06/2006  Job #: [id number]

## 2010-11-21 NOTE — Discharge Summary (Signed)
[place]. [facility]  Patient:    [name], [name]                    MRN: [id number] Adm. Date:  [id number] Disc. Date: 12/18/00 Attending:  Charlett Lango Dictator:   Liane Comber, P.A. CC:         Luis Abed, M.D. [facility]  [facility]  CVTS Office   Discharge Summary  DATE OF BIRTH:  1950/08/28  ADMISSION DIAGNOSES: 1. New onset chest pain with unstable angina. 2. Chronic obstructive pulmonary disease, "black lung." 3. History of coronary artery disease.  NEW DIAGNOSES:  This admission. 1. Hypertension. 2. Severe complex single vessel coronary artery disease with unstable angina. 3. Status post coronary artery bypass graft surgery x 4.  PROCEDURE: 1. Cardiac catheterization done on December 13, 2000. 2. Pre coronary artery bypass graft surgery Doppler evaluation done on December 13, 2000. 3. Coronary artery bypass graft surgery x 4 with the following grafts placed;    sequential left internal mammary artery to the left anterior descending    artery and the diagonal 2 branch, and sequential left radial artery to the    first diagonal 1 branch and diagonal 3 branch of the first diagonal.  HOSPITAL COURSE:  This patient is a 60 year old male with a strong family history of coronary artery disease.  He presented with acute onset of substernal chest pain and was found to have unstable angina.  Underwent cardiac catheterization which revealed severe complex disease in the LAD system, mild disease in the right system, only luminal irregularities in the circumflex.  His LV function was preserved.  Because of the severe disease in the LAD, it was not amenable to PTCA, the patient was referred for coronary artery bypass graft surgery.  He was seen in consultation by Dr. [name] on June 10, and was deemed that the patient would benefit from coronary artery revascularization.  He underwent the surgery on December 14, 2000.  He  tolerated the procedure well.  His postoperative course was essentially uneventful. He did not have any cardiac or respiratory complications.  He progressed steadily, was ambulated daily by cardiac rehabilitation phase I, and he remained in normal sinus rhythm and was hemodynamically stable.  He is anticipated for discharge on December 18, 2000.  CONDITION ON DISCHARGE:  Stable and improved.  DISCHARGE MEDICATIONS: 1. Coated aspirin 325 mg daily. 2. Lopressor 25 mg b.i.d. 3. Lasix 40 mg daily x 7 days. 4. Potassium chloride 20 mEq daily x 7 days. 5. Imdur 30 mg daily. 6. Zocor 20 mg at bedtime. 7. Tylox for pain one to two tablets every four to six hours as needed.  ACTIVITY:  The patient is instructed not to do any driving or heavy lifting or strenuous activity.  He is to continue to walk daily and continue breathing exercises.  DIET:  Low fat, low sodium diet.  WOUND CARE:  He was told he could shower and wash the wounds with mild soap and water and call the office if any wound problems arise as noted on fact sheet.  FOLLOW-UP:  The patient has an appointment with Dr. Gayleen Orem. on December 31, 2000, at 12 p.m. and is going to have a chest x-ray taken at Dr. Myrtis Ser office and is to follow up with Dr. [name] on January 05, 2001, at 9:45 a.m. and is instructed to bring his chest x-ray with him. DD:  12/17/00 TD:  12/17/00 Job: [id number]

## 2010-11-21 NOTE — Consult Note (Signed)
[place]. [facility]  Patient:    [name], [name]                    MRN: [id number] Proc. Date: 12/13/00 Adm. Date:  [id number] Attending:  Talitha Givens CC:         Luis Abed, M.D. [facility]   Consultation Report  REASON FOR CONSULTATION:  Two vessel coronary artery disease.  HISTORY OF PRESENT ILLNESS:  [name] is a 60 year old white male with a strong family history of coronary disease who presented to [facility] on December 11, 2000, with the complaint of severe anterior chest heaviness.  This was accompanied by shortness of breath, nausea.  There was no diaphoresis.  He rated at 8 to 9 on a scale of 10.  He said that it was associated with a sensation that he was going to die.  He spoke to his wife who insisted he come to the emergency room.  On arrival in the emergency room his chest pain had resolved and he was admitted for rule out MI.  He had additional pain in the elevator and was started on an Integrilin drip.  His initial EKG, CKs and troponins were negative.  He subsequently had borderline troponin elevation but normal CPK MBs.  This was his first episode of chest heaviness and it last for a prolonged period of time.  He has been for the past 3 to 4 weeks feeling very tired with no energy and over the past year he has noted episodes where he would awaken at night short of breath and have to sit up.  PAST MEDICAL HISTORY:  Shortness of breath secondary to "black lung".  No hypertension, diabetes, CVA, bleeding, peptic ulcer disease or hypercholesterolemia.  PAST SURGICAL HISTORY:  Appendectomy in 1982.  MEDICATIONS: None.  ALLERGIES:  CODEINE.  REVIEW OF SYSTEMS:  See HPI.  He has had no recent weight gain or loss.  He has some exertional dyspnea.  No peripheral edema, palpitations or syncopal episodes.  No history of stroke or TIA symptoms.  No change in bowel or bladder habits.  He does tend to bleed easily when he  takes aspirin but he has not been taking that recently.  He has no history of clotting abnormalities. All other systems are negative.  PHYSICAL EXAMINATION:  GENERAL:  [name] is a well appearing 60 year old white male.  He is afebrile.  In general he is well-developed and well-nourished and slightly overweight.  VITAL SIGNS:  His blood pressure is 130/60.  His pulse is 60 in sinus rhythm. His respirations are 16.  NEUROLOGIC:  He is alert and oriented x 3 and grossly intact.  NECK:  Supple without thyromegaly, adenopathy or bruits.  LUNGS: Clear to auscultation and percussion but no rales or wheezing.  CARDIAC:  Regular rate and rhythm with a normal S1, S2 with no rubs, murmurs or gallops.  ABDOMEN:  Soft and nontender.  EXTREMITIES:  He has 2+ pulses throughout but no peripheral edema.  He has a normal Allen test bilaterally.  SKIN:  Intact without lesions.  EKG:  EKGs reveal a normal sinus rhythm with an increased R ratio in V1.  CHEST X-RAY:  Revealed no active disease.  LABORATORY DATA:  CPK was 146, MB 1.9, troponin 0.04.  His PTT was 50.  His white count was 7.7, platelet count was 187.  His hematocrit was 43%.  His SGPT was 59.  OT was 37, and  the remainder of his LFTs were within normal limits.  His albumin is 3.6.  His electrolytes are within normal limits.  HIs BUN and creatinine were 12 and 1.0.  His lipid profile revealed triglycerides of 182.  Total cholesterol of 189.  His HDL was slightly low at 34.  Cardiac catheterization and cines were reviewed.  He has complex LAD disease. He has mild disease in his right and essentially no disease in his circumflex system.  IMPRESSION:  [name] is a 60 year old white male with a strong family history of coronary disease who presented with unstable angina. Catheterization reveals complex disease in the LAD system including a large anterolateral first diagonal branch that essentially comes off the LAD but  is essentially a ramus intermedius branch responding mostly to the anterior lateral wall.  He has no significant disease in the circumflex and has an approximately a 40% ostial right stenosis but is not flow limiting.  He has preserved left ventricular function.  His complex LAD disease is not approachable with percutaneous intervention.  Given his unstable angina his best option for treatment is coronary artery bypass grafting for myocardial preservation and relief of symptoms.  I have discussed the indications, risks, benefits, and alternative treatments in detail with the patient and his family.  They understand that the risks include but are not limited to death, stroke, MI, bleeding, possible need for blood transfusions, infections, other organ system dysfunction including respiratory, renal, hepatic and GI, DVT or PE.  Given his young age and likelihood for need additional cardiac procedures in the future, all arterial grafting is warranted if possible.  We plan to use the left internal mammary artery as a sequential graft to the second diagonal and LAD and will use the left radial artery for grafting of the first diagonal system.  I do not think there is significant physical examination diagnosis in the right coronary and I do not think grafting is warranted for that.  I discussed the rational for radial artery grafting with the patient and his family.  They understand the rational for all arterial grafting in a young patient in order to lessen the potential need for re-do procedures.  They understand that there is a risk with left hand ischemia or neurologic dysfunction secondary to radial artery harvest.  All the patients and familys questions were answered.  They understand and accept the risks of the surgery and agree to proceed.  We will plan on surgery the morning of December 14, 2000, as a first case. DD:  12/13/00 TD:  12/13/00 Job: [id number]

## 2010-11-21 NOTE — Assessment & Plan Note (Signed)
[facility]                              CARDIOLOGY OFFICE NOTE   SIVAN, QUAST                    MRN:          [id number]  DATE:05/10/2006                            DOB:          [birth year]    CARDIOLOGIST:  Luis Abed, MD, [facility]   PRIMARY CARE PHYSICIAN:  Tera Mater. Clent Ridges, MD   HISTORY OF PRESENT ILLNESS:  Mr. Netz is a very pleasant 60 year old male  patient followed by Dr. Myrtis Ser with a history of coronary disease status post  CABG in 2002. He underwent cardiac catheterization in 2004.  This revealed a  patent LIMA to the first diagonal and LAD.  The vessels were quite small,  and there was potential for upstream ischemia.  The free radial was patent  at the first anastomosis, but the second limb was occluded.  The most severe  stenosis was in a vessel that was less than 2 mm.  His LV function was  preserved with an EF of 62%.  Post catheterization, he underwent stress  Cardiolite.  This revealed no ischemia and EF of 59%.   He saw Joni Reining, NP, last week for chest discomfort.  He had some  EKG changes.  He had been complaining of a recent upper respiratory tract  infection.  He had a chest x-ray done in the office.  The official results  are not available yet.  Some blood work was performed.  This revealed a  normal CBC.  BNP was negative.  Troponin was also normal.  BMET was normal  except for elevated glucose at 333 mg/dl.  He is not a known diabetic.   He returns to the office today for followup.  He notes that he continues to  have occasional chest heaviness.  This seems to be with overexertion.  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  He has noted  this at times after a sermon.  There is associated diaphoresis. There is no  radiation.  He does note some shortness of breath with this as well.  He  denies any rest pain, denies any syncope or presyncope.  He does have some  atypical  left-sided chest pain that he has had since his bypass.  There has  been no change there.  He continues to cough but seems to be slowly  improving.  He notes some yellowish sputum that gets clearer throughout the  day.  Denies any fevers or chills.  He has also had some sinus congestion.   CURRENT MEDICATIONS:  1. Aspirin 81 mg daily.  2. Multivitamins.  3. Fish oil.  4. Sleepinal.  5. Metoprolol 50 mg 1/2 tablet b.i.d.  6. CholestOff as needed.   ALLERGIES:  CODEINE and MORPHINE.   SOCIAL HISTORY:  Denies any tobacco or alcohol abuse.   REVIEW OF SYSTEMS:  Please see HPI.  Denies any melena, hematochezia,  hematuria, dysuria, symptoms consistent with amaurosis fugax or TIAs.  He  denies any symptoms consistent with claudication.  Remaining systems are  negative.   PHYSICAL EXAMINATION:  GENERAL: Well-nourished, well-developed man  in no  acute distress.  VITAL SIGNS:  Blood pressure 153/85, pulse 68.  Weight 218 pounds.  HEENT:  Head normocephalic and atraumatic.  Eyes: Pupils equal, round, and  reactive to light.  EOMI. Sclerae clear.  Oropharynx pink without exudate.  TMs clear bilaterally.  LYMPH:  Without lymphadenopathy.  CARDIAC: Normal S1 and S2.  Regular rate and rhythm without murmur.  LUNGS: Clear to auscultation bilaterally without wheezing, rhonchi, or  rales.  ABDOMEN:  Soft, nontender.  No palpable organomegaly.  EXTREMITIES:  Without edema.  Calves soft, nontender.  SKIN: Warm and dry.  Femoral artery pulses 2+ bilaterally with no bruits.  NEUROLOGIC: Alert and oriented x3.  Cranial nerves II-XII grossly intact.   Electrocardiogram reveals sinus rhythm with heart rate of 63, normal axis,  less than a millimeter of ST depression, and T wave inversions in I and aVL  and V2 through V6.  This is similar to the tracing done on May 05, 2006.  These findings are new since the EKG done in October 2005.   IMPRESSION:  1. Exertional chest discomfort.  2. Coronary  artery disease status post coronary artery bypass grafting      with grafts as noted above.  3. Preserved left ventricular function.  4. Dyslipidemia.  Intolerant to STATINS as well as ZETIA.  5. History of coal miner's lung.  6. Hypertension, uncontrolled.  7. New onset diabetes mellitus.   PLAN:  The patient presents for followup today, and he is noting some  exertional chest heaviness.  he has new EKG changes.  I have discussed the  possibility of catheterization versus Myoview testing with Dr. Diona Browner.  We  have decided to go ahead and proceed with Myoview testing at this point in  time.  This has been explained to the patient.  He will be set up for an  adenosine Myoview later this week.   He noted that metoprolol 50 mg twice a day was making him feel poorly, and  he went back to 1/2 tablet twice a day.  He was on this prior to his visit  last week with Ms. Lawrence.  He can remain on 25 mg twice a day at this  point in time.  I have placed him on Lisinopril 10 mg a day.  Will recheck a  BMET in a week.   He needs followup for his new diagnosis of diabetes mellitus.  We will  arrange for him to see his primary care physician, Dr. Clent Ridges, within the next  day or two.   He knows to go to the emergency room should he have any rest symptoms or  worsening symptoms.  Otherwise, I will bring him back to see Dr. Myrtis Ser either  later this week after his Myoview test or next week.      Tereso Newcomer, PA-C  Electronically Signed     ______________________________  Luis Abed, MD, [facility]   SW/MedQ  DD: 05/10/2006  DT: 05/10/2006  Job #: [id number]   cc:   Jeannett Senior A. Clent Ridges, MD

## 2010-11-21 NOTE — Consult Note (Signed)
NAMEFENTON, [name]                       ACCOUNT NO.:  [id number]   MEDICAL RECORD NO.:  [id number]                   PATIENT TYPE:  INP   LOCATION:  1826                                 FACILITY:  [facility]   PHYSICIAN:  Duke Salvia, M.D.               DATE OF BIRTH:  [birth year]   DATE OF CONSULTATION:  DATE OF DISCHARGE:                                   CONSULTATION   REASON FOR CONSULTATION:  Mr. Hallum is a 60 year old gentleman with known  ischemic heart disease status post bypass surgery for complex 2-vessel  disease accomplished by Dr. [name] in June 2002 in the setting of  preserved left ventricular function.  At that time he received a sequential  LIMA to his LAD and D2 and a sequential left radial to his D1 and D3.   He has done well since that time until recently where he has had a couple of  occasions where he has had dyspnea.  This evening he developed some dyspnea  and a sensation of a bubble in his chest accompanied by nausea and  diaphoresis.  These were the symptoms with similarly to which he presented  at the time of bypass surgery.  They are persisting at present though they  were relieved partially by nitroglycerin.   He denies exercise intolerance.  He denies shortness of breath or peripheral  edema.  He has had no palpitations or syncope. He also denies a history of  significant GI disease.   PAST MEDICAL HISTORY:  His past medical history is notable for black lung.  Otherwise his past medical history is largely negative.   ALLERGIES:  He is allergic to CODEINE and intolerant of STATINS.   MEDICATIONS:  He takes aspirin and metoprolol 25 b.i.d.   SOCIAL HISTORY:  He is married.  He is a Programmer, multimedia.  He has 3 children.   FAMILY HISTORY:  His family history is noncontributory.   REVIEW OF SYSTEMS:  His review of systems is noted and Dr. Cato Mulligan' note is  otherwise unrevealing.   PHYSICAL EXAMINATION:  GENERAL: On examination he is a  middle-aged Caucasian  male in no acute distress.  VITAL SIGNS:  His blood pressure is 113/74 with a pulse of 54.  HEENT:  His HEENT exam demonstrated no icterus or xanthoma.  NECK:  His neck veins were flat. His carotids were brisk and full  bilaterally without bruits.  BACK: The back was without kyphosis, scoliosis, or CVA tenderness.  LUNGS:  The lungs were clear.  HEART:  Heart sounds were regular without murmurs or gallops.  ABDOMEN:  Soft with active bowel sounds without midline pulsation or  epigastric tenderness or hepatomegaly.  EXTREMITIES:  Femoral pulses were 2+, distal pulses were intact.  There is  no clubbing, cyanosis, or edema.  NEUROLOGIC: The neurological exam was grossly normal.  SKIN: Skin was warm and dry.   LABORATORY AND ACCESSORY DATA:  Electrocardiogram demonstrates sinus rhythm  at 75 with interval points 0.16, 0.09, 0.14.  The axis was 90 degrees. There  was minor ST segment depression across the anterior precordium which is  distinct from his predischarge electrocardiogram 2 years ago.   IMPRESSION:  1. Recurrent atypical chest pain similar to his pre-coronary artery bypass     graft chest pain.  2. Known coronary artery disease.     a. Status post coronary artery bypass graft, as noted above.     b. Normal left ventricular function.  3. Treated hypertension.  4. Statin intolerance.   Mr. Dehart has recurrent discomfort similar to his pre-CABG chest pain  which is consistent with angina.  I wonder whether this may be related to  his radial artery.  He also had residual bypass disease in his right  coronary artery that was in the 30-50% range; there could, thus, have been  progression of his other native disease.   I guess possibly this possibly is also GI and Dr. Cato Mulligan has begun therapy  with the PPI with which I would agree.   RECOMMENDATIONS:  1. I would add heparin and nitroglycerin.  2. Take a fasting lipid profile and consider treatment  with Zetia.  3. Serial enzymes.  4. Will need catheterization.  5. Continue PPI therapy.   Thank you for this consultation.                                               Duke Salvia, M.D.    SCK/MEDQ  D:  02/01/2003  T:  02/01/2003  Job:  [id number]   cc:   Jeannett Senior A. Clent Ridges, M.D. [facility]

## 2010-11-21 NOTE — Cardiovascular Report (Signed)
[name], [name]                       ACCOUNT NO.:  [id number]   MEDICAL RECORD NO.:  [id number]                   PATIENT TYPE:  INP   LOCATION:  3307                                 FACILITY:  [facility]   PHYSICIAN:  [name], M.D.             DATE OF BIRTH:  January 13, 1951   DATE OF PROCEDURE:  02/01/2003  DATE OF DISCHARGE:                              CARDIAC CATHETERIZATION   PROCEDURE:  Left heart catheterization, left ventriculography, coronary  angiography, free radial graft angiography, left subclavian angiography,  left internal mammary artery angiography.   INDICATIONS:  [name] is a 60 year old gentleman status post coronary  artery bypass grafting by [name]. [name], M.D. in 2002 who presents  with a single episode of dyspnea occurring at rest.  This event was similar  to the episode that precipitated his initial revascularization.  He was  admitted to the hospital.  He ruled out for myocardial infarction.  Electrocardiogram was without evidence of ischemia.  Due to his known  disease and similarity of this presentation to his prior, he was referred  directly for diagnostic angiography.   PROCEDURAL TECHNIQUE:  Informed consent was obtained.  Under 1% lidocaine  local anesthesia a 6-French sheath was placed in the right femoral artery  using the modified Seldinger technique.  Diagnostic angiography was  performed using JL4 and JR4 for the native coronaries, JR4 for the left  subclavian, LIMA for the LIMA, and left bypass graft for the free radial  graft.  Ventriculography was performed in the RAO projection using the  pigtail catheter.  The patient tolerated the procedure well and was  transferred to the holding room in stable condition.  Sheaths are to be  removed there.   COMPLICATIONS:  None.   FINDINGS:  1. LV 143/9/14.  EF 62% without regional wall motion abnormality.  2. No aortic stenosis or mitral regurgitation.  3. Left main:   Angiographically normal.  4. LAD:  The LAD is a moderate sized vessel reaching the apex of the heart     giving rise to a single diagonal branch.  There is an 80% stenosis of the     mid vessel at the takeoff of the diagonal branch.  This diagonal has a     70% ostial stenosis.  There was a sequential LIMA graft to the diagonal     branch and the distal LAD.  Touchdowns are quite distal on these vessels.     The vessels are quite small distal to the anastomosis.  There is only     modest retrograde filling of each vessel.  The LIMA itself is widely     patent.  5. Ramus intermedius:  Very large vessel giving rise to multiple branches.     The ramus proper has a 70% stenosis between its first and second major     branches.  The first branch has an 80% ostial stenosis and  the second has     a 90% ostial stenosis.  The free radial to the first ramus branch is     widely patent.  The sequential limb that previously perfused the third     branch is now occluded.  6. Circumflex:  Moderate sized vessel giving rise to a single obtuse     marginal.  It is angiographically normal.  7. RCA:  Moderate sized, dominant vessel which is angiographically normal.  8. Left subclavian:  Patent without stenosis.   IMPRESSION/RECOMMENDATIONS:  1. Patent left internal mammary artery to D1 and left anterior descending     but the vessels are quite small distal to the anastomoses.  There is the     potential for upstream ischemia.  2. Occluded second limb of free radial to ramus branches.  The most severe     stenosis is of a vessel less than 2 mm in diameter.  3. Preserved left ventricular systolic function.   It is not at all clear that his dyspnea was cardiac in etiology.  He does  have multiple areas of potential flow limitation.  None are so severe as to  be likely to produce resting discomfort or dyspnea.  Will therefore plan  outpatient EKG Cardiolite to be compared with the study done approximately   one year ago.  Angiomax was discontinued.  Anticipate discharge tomorrow.                                               [name], M.D.    WED/MEDQ  D:  02/01/2003  T:  02/02/2003  Job:  [id number]   cc:   Valetta Mole. Swords, M.D. [facility]   Jeannett Senior A. Clent Ridges, M.D. [facility]   Willa Rough, M.D.

## 2010-11-26 ENCOUNTER — Encounter: Payer: Self-pay | Admitting: Family Medicine

## 2010-11-26 ENCOUNTER — Ambulatory Visit (INDEPENDENT_AMBULATORY_CARE_PROVIDER_SITE_OTHER): Payer: BC Managed Care – PPO | Admitting: Family Medicine

## 2010-11-26 VITALS — BP 138/78 | HR 68 | Temp 98.5°F | Ht 69.5 in | Wt 221.0 lb

## 2010-11-26 DIAGNOSIS — M542 Cervicalgia: Secondary | ICD-10-CM

## 2010-11-26 DIAGNOSIS — I10 Essential (primary) hypertension: Secondary | ICD-10-CM

## 2010-11-26 DIAGNOSIS — E119 Type 2 diabetes mellitus without complications: Secondary | ICD-10-CM

## 2010-11-26 DIAGNOSIS — I2510 Atherosclerotic heart disease of native coronary artery without angina pectoris: Secondary | ICD-10-CM

## 2010-11-26 NOTE — Progress Notes (Signed)
Subjective:    Patient ID: [name], male    DOB: 10-23-1950, 60 y.o.   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 He also has multiple osteophytes and diffuse facet arthropathy. The pains radiate to both shoulder and shoot pains into both arms and hands. He also has chronic numbness and weakness in the arms and hands, primarily on the left side. He has seen Dr. Barnett Abu, who did not recommend any type of surgery for this. He has seen Dr. Rennis Chris, who has tried cortisone shots into the left shoulder for shoulder pain, but this did not help the neck issue. He has had several rounds of PT and has seen chiropractors for manipulations. He has been seeing Dr. Herminio Heads for this, and she has tried epidural steroid injections to the cervical spine last November, but these did not help. Then she tried RF ablations to the left cervical nerve roots in January. This helped the numbness in the left arm and hand a bit, but did nothing to help the pain. He has been on numerous narcotic pain meds in the past but is currently on Motrin and Voltaren gel only. All this has been covered so far by his Workers Compensation claim stemming from an injury at work on 12-19-09, when he hurt his neck by pulling down a 32 inch TV from overhead. The next possible step that Dr. Nickola Major is considering, and which his Workers Clinical cytogeneticist is pushing him to do, is to have what sounds like a nerve stimulator implanted along the cervical spine to deaden the pain signals in these nerves. The patient and his wife are very leery of this idea, and he is very concerned about side effects, primarily infection. They know that if such an implanted device should get infected that this would be a very dangerous situation that would very  difficult to treat. Because of his diabetes, heart disease, etc. They feel he is not a good candidate for this procedure and he does not want to go through with this. They ask my opinion as his primary care physician.    Review of Systems  HENT: Positive for neck pain and neck stiffness.   Respiratory: Negative.   Cardiovascular: Negative.   Gastrointestinal: Negative.   Neurological: Positive for weakness, numbness and headaches.  Psychiatric/Behavioral: Negative.        Objective:   Physical Exam  Constitutional: He appears well-developed and well-nourished.  Cardiovascular: Normal rate, regular rhythm, normal heart sounds and intact distal pulses.  Exam reveals no gallop and no friction rub.   No murmur heard. Pulmonary/Chest: Effort normal and breath sounds normal. No respiratory distress. He has no wheezes. He has no rales. He exhibits no tenderness.  Musculoskeletal:       Very limited ROM of the neck with spasm and tenderness          Assessment & Plan:  This is a patient with diabetes, HTN, hyperlipidemia, CAD, and other serious general medical problems who has a chronic neck pain situation. I agree with him that implanting a nerve stimulator device would be too dangerous for him to undergo, and I advise against anyone trying this. He would like to settle with Workers Comp for some type of permanent disability status since he cannot work any more, and I support this  plan. I do not think there is anything else that can be done to improve the neck pain without resorting to dangerous and ill advised procedures as above. He will follow up with Dr. Nickola Major to discuss this.

## 2010-11-28 ENCOUNTER — Other Ambulatory Visit: Payer: Self-pay | Admitting: *Deleted

## 2010-11-28 DIAGNOSIS — I10 Essential (primary) hypertension: Secondary | ICD-10-CM

## 2010-11-28 MED ORDER — LISINOPRIL 20 MG PO TABS: 20.0000 mg | ORAL_TABLET | Freq: Every day | ORAL | Status: DC

## 2010-12-30 ENCOUNTER — Telehealth: Payer: Self-pay | Admitting: Family Medicine

## 2010-12-30 NOTE — Telephone Encounter (Signed)
Woman called requesting a call back about some paperwork they are supposed to pick up from Dr. Clent Ridges regarding the patient's implant.

## 2010-12-30 NOTE — Telephone Encounter (Signed)
This was taken care of

## 2011-01-06 ENCOUNTER — Other Ambulatory Visit: Payer: Self-pay | Admitting: *Deleted

## 2011-01-06 MED ORDER — CLONAZEPAM 1 MG PO TABS: 1.5000 mg | ORAL_TABLET | Freq: Every day | ORAL | Status: DC

## 2011-01-06 NOTE — Telephone Encounter (Signed)
Please refill for 6 months 

## 2011-01-06 NOTE — Telephone Encounter (Signed)
Received refill request from pharmacy for Clonazepam 1mg  #60. Last refill 06/09/10 and last seen 06/09/10. Please advise.

## 2011-02-03 DIAGNOSIS — Z0279 Encounter for issue of other medical certificate: Secondary | ICD-10-CM

## 2011-02-04 ENCOUNTER — Telehealth: Payer: Self-pay | Admitting: Family Medicine

## 2011-02-04 NOTE — Telephone Encounter (Signed)
This has been done. It should be ready up front (unless someone here  faxed it in)

## 2011-02-04 NOTE — Telephone Encounter (Signed)
Pt dropped off papers the first of last week that needed to be filled out of his place of employment. Pt wanted to know if they were ready to be picked up. Please contact pt.

## 2011-02-04 NOTE — Telephone Encounter (Signed)
Spoke with pt, forms filled out and ready for pick, up front.

## 2011-04-01 ENCOUNTER — Telehealth: Payer: Self-pay | Admitting: Family Medicine

## 2011-04-01 NOTE — Telephone Encounter (Signed)
Refill request for Vicodin 5-500 mg take 1 po q6hrs prn and Voltaren 1 % gel and pt was last here on 11/26/10. Pt stated that you would be managing these medications for him.

## 2011-04-03 MED ORDER — DICLOFENAC SODIUM 1 % TD GEL: 1.0000 "application " | TRANSDERMAL | Status: DC | PRN

## 2011-04-03 MED ORDER — HYDROCODONE-ACETAMINOPHEN 5-500 MG PO TABS: 1.0000 | ORAL_TABLET | Freq: Four times a day (QID) | ORAL | Status: DC | PRN

## 2011-04-03 NOTE — Telephone Encounter (Signed)
Call in this Vicodin to use q 6 hours prn pain, #120 with 5 rf. Also Voltaren gel to apply tid prn pain, 30 days with 11 rf

## 2011-04-27 ENCOUNTER — Ambulatory Visit (INDEPENDENT_AMBULATORY_CARE_PROVIDER_SITE_OTHER): Payer: BC Managed Care – PPO | Admitting: Family Medicine

## 2011-04-27 ENCOUNTER — Encounter: Payer: Self-pay | Admitting: Family Medicine

## 2011-04-27 VITALS — BP 120/72 | HR 79 | Temp 98.5°F | Wt 220.0 lb

## 2011-04-27 DIAGNOSIS — N390 Urinary tract infection, site not specified: Secondary | ICD-10-CM

## 2011-04-27 LAB — POCT URINALYSIS DIPSTICK
Glucose, UA: 1
Leukocytes, UA: NEGATIVE
Urobilinogen, UA: 0.2

## 2011-04-27 MED ORDER — SULFAMETHOXAZOLE-TRIMETHOPRIM 800-160 MG PO TABS: 1.0000 | ORAL_TABLET | Freq: Two times a day (BID) | ORAL | Status: DC

## 2011-04-27 NOTE — Progress Notes (Signed)
Addended by: Aniceto Boss A on: 04/27/2011 12:13 PM   Modules accepted: Orders

## 2011-04-27 NOTE — Progress Notes (Signed)
  Subjective:    Patient ID: [name], male    DOB: 27-Sep-1950, 60 y.o.   MRN: [id number]  HPI Here for 3 days of fever, burning on urination, and difficulty urinating. Drinking plenty of fluids and taking Azo.    Review of Systems  Constitutional: Positive for fever.  HENT: Negative.   Respiratory: Negative.   Cardiovascular: Negative.   Gastrointestinal: Negative.   Genitourinary: Positive for dysuria, urgency and difficulty urinating. Negative for hematuria and flank pain.       Objective:   Physical Exam  Constitutional: He appears well-developed and well-nourished.  Abdominal: Soft. Bowel sounds are normal. He exhibits no distension and no mass. There is no tenderness. There is no rebound and no guarding.          Assessment & Plan:  Recheck prn

## 2011-07-20 ENCOUNTER — Telehealth: Payer: Self-pay | Admitting: Family Medicine

## 2011-07-20 NOTE — Telephone Encounter (Signed)
Refill request for Clonazepam 1 mg take 1 po either qd or bid? Pt last here on 11/26/10.

## 2011-07-21 ENCOUNTER — Telehealth: Payer: Self-pay | Admitting: *Deleted

## 2011-07-21 MED ORDER — CLONAZEPAM 1 MG PO TABS: 1.0000 mg | ORAL_TABLET | Freq: Two times a day (BID) | ORAL | Status: DC | PRN

## 2011-07-21 NOTE — Telephone Encounter (Signed)
They called only once (yesterday) and I answered this today

## 2011-07-21 NOTE — Telephone Encounter (Signed)
Script called in, tried to reach pt and no answer.

## 2011-07-21 NOTE — Telephone Encounter (Signed)
He takes these bid prn, call in #60 with 5 rf

## 2011-07-21 NOTE — Telephone Encounter (Signed)
Wife is calling stating they have been trying to get pt's Klonopin refilled for weeks, and he is completely out. Needs it filled today.

## 2011-07-21 NOTE — Telephone Encounter (Signed)
Script called in, tried to reach pt and no answer. 

## 2011-08-10 ENCOUNTER — Telehealth: Payer: Self-pay | Admitting: Family Medicine

## 2011-08-10 NOTE — Telephone Encounter (Signed)
Pharmacist called and said that they can not send scripts via escribe, due to pcp not being updated to rcv escribe via pharmacy. Pls fax metFORMIN (GLUCOPHAGE) 1000 MG tablet

## 2011-08-12 ENCOUNTER — Other Ambulatory Visit: Payer: Self-pay | Admitting: Family Medicine

## 2011-08-12 NOTE — Telephone Encounter (Signed)
Rx sent to pharmacy   

## 2011-09-17 ENCOUNTER — Encounter: Payer: Self-pay | Admitting: Family Medicine

## 2011-09-21 ENCOUNTER — Other Ambulatory Visit: Payer: Self-pay | Admitting: Family Medicine

## 2011-10-05 ENCOUNTER — Telehealth: Payer: Self-pay | Admitting: Family Medicine

## 2011-10-05 MED ORDER — HYDROCODONE-ACETAMINOPHEN 5-500 MG PO TABS: 1.0000 | ORAL_TABLET | Freq: Four times a day (QID) | ORAL | Status: DC | PRN

## 2011-10-05 NOTE — Telephone Encounter (Signed)
Addended by: Aniceto Boss A on: 10/05/2011 03:13 PM   Modules accepted: Orders

## 2011-10-05 NOTE — Telephone Encounter (Signed)
Pt is on Vicodin 5-500 mg take 1 po q6hrs prn and was last seen here on 04/27/11.

## 2011-10-05 NOTE — Telephone Encounter (Signed)
Script called in

## 2011-10-05 NOTE — Telephone Encounter (Signed)
Patient's spouse called stating that he need a refill on his hydrocodone. Please assist.

## 2011-10-05 NOTE — Telephone Encounter (Signed)
Ok to refill for 40 pills until Dr Clent Ridges back in office

## 2011-10-08 ENCOUNTER — Telehealth: Payer: Self-pay | Admitting: Family Medicine

## 2011-10-08 NOTE — Telephone Encounter (Signed)
Patient's spouse called stating that her husband was supposed to have 120 hydrocodone and was only given 40. Please advise.

## 2011-10-08 NOTE — Telephone Encounter (Signed)
Spoke with pt

## 2011-10-15 ENCOUNTER — Telehealth: Payer: Self-pay | Admitting: Family Medicine

## 2011-10-15 NOTE — Telephone Encounter (Signed)
Call in #120 with 5 rf 

## 2011-10-15 NOTE — Telephone Encounter (Signed)
Patient's spouse called stating that patient need a refill on his hydrocodone. Please assist.

## 2011-10-19 ENCOUNTER — Other Ambulatory Visit: Payer: Self-pay | Admitting: Family Medicine

## 2011-10-19 MED ORDER — HYDROCODONE-ACETAMINOPHEN 5-500 MG PO TABS: 1.0000 | ORAL_TABLET | Freq: Four times a day (QID) | ORAL | Status: DC | PRN

## 2011-10-19 NOTE — Telephone Encounter (Signed)
Pt only received 40 hydrocodone on 10-05-2011 authorized by Dr Fabian Sharp. Pt would like remaining pills [facility] [address]

## 2011-10-19 NOTE — Telephone Encounter (Signed)
Script called in

## 2011-10-19 NOTE — Telephone Encounter (Signed)
Addended by: Aniceto Boss A on: 10/19/2011 05:26 PM   Modules accepted: Orders

## 2011-10-20 NOTE — Telephone Encounter (Signed)
We did this yesterday

## 2011-10-29 ENCOUNTER — Encounter: Payer: Self-pay | Admitting: Family Medicine

## 2011-10-29 ENCOUNTER — Ambulatory Visit (INDEPENDENT_AMBULATORY_CARE_PROVIDER_SITE_OTHER): Payer: BC Managed Care – PPO | Admitting: Family Medicine

## 2011-10-29 VITALS — BP 128/86 | HR 100 | Temp 99.2°F | Wt 215.0 lb

## 2011-10-29 DIAGNOSIS — E119 Type 2 diabetes mellitus without complications: Secondary | ICD-10-CM

## 2011-10-29 DIAGNOSIS — J40 Bronchitis, not specified as acute or chronic: Secondary | ICD-10-CM

## 2011-10-29 LAB — HEPATIC FUNCTION PANEL
ALT: 44 U/L (ref 0–53)
Albumin: 4.1 g/dL (ref 3.5–5.2)
Alkaline Phosphatase: 99 U/L (ref 39–117)
Total Protein: 7.2 g/dL (ref 6.0–8.3)

## 2011-10-29 LAB — LIPID PANEL
Cholesterol: 188 mg/dL (ref 0–200)
HDL: 39.8 mg/dL (ref >39.00)
Triglycerides: 211 mg/dL — ABNORMAL HIGH (ref 0.0–149.0)
VLDL: 42.2 mg/dL — ABNORMAL HIGH (ref 0.0–40.0)

## 2011-10-29 LAB — CBC WITH DIFFERENTIAL/PLATELET
Basophils Absolute: 0 10*3/uL (ref 0.0–0.1)
Hemoglobin: 15.7 g/dL (ref 13.0–17.0)
Lymphocytes Relative: 21.6 % (ref 12.0–46.0)
Monocytes Relative: 14.7 % — ABNORMAL HIGH (ref 3.0–12.0)
Neutro Abs: 3.3 10*3/uL (ref 1.4–7.7)
Neutrophils Relative %: 60.8 % (ref 43.0–77.0)
RBC: 4.89 Mil/uL (ref 4.22–5.81)
RDW: 13 % (ref 11.5–14.6)

## 2011-10-29 LAB — POCT URINALYSIS DIPSTICK
Leukocytes, UA: NEGATIVE
Nitrite, UA: NEGATIVE
Protein, UA: NEGATIVE
pH, UA: 7

## 2011-10-29 LAB — HEMOGLOBIN A1C: Hgb A1c MFr Bld: 7.9 % — ABNORMAL HIGH (ref 4.6–6.5)

## 2011-10-29 LAB — BASIC METABOLIC PANEL
BUN: 11 mg/dL (ref 6–23)
Creatinine, Ser: 0.8 mg/dL (ref 0.4–1.5)
GFR: 108.78 mL/min (ref >60.00)

## 2011-10-29 LAB — LDL CHOLESTEROL, DIRECT: Direct LDL: 128 mg/dL

## 2011-10-29 MED ORDER — SULFAMETHOXAZOLE-TRIMETHOPRIM 800-160 MG PO TABS: 1.0000 | ORAL_TABLET | Freq: Two times a day (BID) | ORAL | Status: DC

## 2011-10-29 MED ORDER — AZITHROMYCIN 250 MG PO TABS: ORAL_TABLET | ORAL | Status: AC

## 2011-10-29 NOTE — Progress Notes (Signed)
  Subjective:    Patient ID: [name], male    DOB: Sep 18, 1950, 61 y.o.   MRN: [id number]  HPI Here for 2 days of fever, chest tightness, and coughing up green sputum.    Review of Systems  Constitutional: Positive for fever.  HENT: Positive for congestion, postnasal drip and sinus pressure.   Eyes: Negative.   Respiratory: Positive for cough.        Objective:   Physical Exam  Constitutional: He appears well-developed and well-nourished.  HENT:  Right Ear: External ear normal.  Left Ear: External ear normal.  Nose: Nose normal.  Mouth/Throat: Oropharynx is clear and moist. No oropharyngeal exudate.  Eyes: Conjunctivae are normal.  Pulmonary/Chest: Effort normal and breath sounds normal.  Lymphadenopathy:    He has no cervical adenopathy.          Assessment & Plan:  Get fasting labs today

## 2011-10-30 ENCOUNTER — Telehealth: Payer: Self-pay | Admitting: *Deleted

## 2011-10-30 MED ORDER — HYDROCODONE-HOMATROPINE 5-1.5 MG/5ML PO SYRP: 5.0000 mL | ORAL_SOLUTION | ORAL | Status: AC | PRN

## 2011-10-30 NOTE — Telephone Encounter (Signed)
Left message on personal  Voice mail to pick up meds.

## 2011-10-30 NOTE — Telephone Encounter (Signed)
None of the OTC cough meds are working per wife, and would like to [facility] Chart states he has an allergy to Codeine has taken all the cough meds with codeine with no problems.

## 2011-10-30 NOTE — Telephone Encounter (Signed)
Call in Hydromet 5 ml q 4 hours prn cough, 240 ml with no rf  

## 2011-11-02 ENCOUNTER — Telehealth: Payer: Self-pay | Admitting: Family Medicine

## 2011-11-02 MED ORDER — GLUCOSE BLOOD VI STRP: ORAL_STRIP | Status: DC

## 2011-11-02 MED ORDER — ACCU-CHEK SOFT TOUCH LANCETS MISC: Status: DC

## 2011-11-02 MED ORDER — ACCU-CHEK ADVANTAGE DIABETES KIT: PACK | Status: AC

## 2011-11-02 NOTE — Progress Notes (Signed)
Addended by: Aniceto Boss A on: 11/02/2011 12:08 PM   Modules accepted: Orders

## 2011-11-02 NOTE — Telephone Encounter (Signed)
Patient's spouse called stating that his rx for cough meds were not at the pharmacy. Please assist. Patient's spouse also states that patient was given a z pack on 10/29/11 and it does not seem to be working ant would like to know if he can have a different antibiotic called in. Please advise.

## 2011-11-02 NOTE — Progress Notes (Signed)
Quick Note:  I spoke with pt's wife and sent a order for new diabetic machine & supplies to pharmacy. ______

## 2011-11-02 NOTE — Telephone Encounter (Signed)
I called the syrup in again this morning. Pt is aware that Dr. Clent Ridges is out of the office today and will be returning on 11/03/11. He will advise if another antibiotic is needed.

## 2011-11-18 ENCOUNTER — Other Ambulatory Visit: Payer: Self-pay | Admitting: Family Medicine

## 2011-11-18 DIAGNOSIS — I10 Essential (primary) hypertension: Secondary | ICD-10-CM

## 2011-11-18 MED ORDER — LISINOPRIL 20 MG PO TABS: 20.0000 mg | ORAL_TABLET | Freq: Every day | ORAL | Status: DC

## 2011-11-18 NOTE — Telephone Encounter (Signed)
Wife states [facility] been sending fax after fax and cannot get  Pt's Metformin and they leaving to go out of town.   Please send it in for them.

## 2012-01-27 ENCOUNTER — Telehealth: Payer: Self-pay | Admitting: Family Medicine

## 2012-01-27 ENCOUNTER — Other Ambulatory Visit: Payer: Self-pay | Admitting: Family Medicine

## 2012-01-27 MED ORDER — METFORMIN HCL 1000 MG PO TABS: 1000.0000 mg | ORAL_TABLET | Freq: Two times a day (BID) | ORAL | Status: DC

## 2012-01-27 NOTE — Telephone Encounter (Signed)
Rx sent and Left message on machine for patient. 

## 2012-01-27 NOTE — Telephone Encounter (Signed)
Caller: Donna/Spouse; Phone Number: [phone]; Message from caller: She said pharmacy is automatically supposed to send in request for refill on his Metformin but when she went to pick it up today it wasn't there.  He has a pill for this evening and in AM and then will be out

## 2012-02-17 ENCOUNTER — Other Ambulatory Visit: Payer: Self-pay | Admitting: Family Medicine

## 2012-02-17 MED ORDER — CLONAZEPAM 1 MG PO TABS: 1.0000 mg | ORAL_TABLET | Freq: Two times a day (BID) | ORAL | Status: DC | PRN

## 2012-02-17 NOTE — Telephone Encounter (Signed)
We just rec'vd [facility] [address] this AM  Last seen 10/29/11 bronchitis Last written 07/21/11 # 60 5RF Please advise

## 2012-02-17 NOTE — Telephone Encounter (Signed)
Call in #60 with 5 rf 

## 2012-02-17 NOTE — Telephone Encounter (Signed)
Wife, [name] calling.  Has been out of his Clonazepam 1mg  tab po bid for several days (she has been out of town).   [facility] [address].  They told her that they have faxed over a refill request but didn't hear back.

## 2012-02-17 NOTE — Telephone Encounter (Signed)
[facility] 

## 2012-04-25 ENCOUNTER — Ambulatory Visit (INDEPENDENT_AMBULATORY_CARE_PROVIDER_SITE_OTHER): Payer: BC Managed Care – PPO | Admitting: Family Medicine

## 2012-04-25 ENCOUNTER — Encounter: Payer: Self-pay | Admitting: Family Medicine

## 2012-04-25 VITALS — BP 134/80 | HR 59 | Temp 97.9°F | Wt 220.0 lb

## 2012-04-25 DIAGNOSIS — G629 Polyneuropathy, unspecified: Secondary | ICD-10-CM

## 2012-04-25 DIAGNOSIS — E119 Type 2 diabetes mellitus without complications: Secondary | ICD-10-CM

## 2012-04-25 DIAGNOSIS — G589 Mononeuropathy, unspecified: Secondary | ICD-10-CM

## 2012-04-25 DIAGNOSIS — M79609 Pain in unspecified limb: Secondary | ICD-10-CM

## 2012-04-25 LAB — HEMOGLOBIN A1C: Hgb A1c MFr Bld: 6.3 % (ref 4.6–6.5)

## 2012-04-25 MED ORDER — GABAPENTIN 300 MG PO CAPS: 300.0000 mg | ORAL_CAPSULE | Freq: Two times a day (BID) | ORAL | Status: DC

## 2012-04-25 MED ORDER — OMEPRAZOLE 40 MG PO CPDR: 40.0000 mg | DELAYED_RELEASE_CAPSULE | Freq: Every day | ORAL | Status: DC

## 2012-04-25 MED ORDER — TRAMADOL HCL 50 MG PO TABS: 100.0000 mg | ORAL_TABLET | Freq: Four times a day (QID) | ORAL | Status: DC | PRN

## 2012-04-25 NOTE — Progress Notes (Signed)
  Subjective:    Patient ID: [name], male    DOB: 15-May-1951, 61 y.o.   MRN: [id number]  HPI Here for a number of issues. First his glucoses have been dropping into the 40s and 50s because he has not been eating much lately. He says he does not feel hungry, and he often feels bloated or slightly nauseated. He has lost some weight. BMs are normal. He has had worsening joint pains and his Vicodin no longer helps much. He has been taking some of his wife's Tramadol, and this helps a lot. Also the burning in the feet has gotten worse.    Review of Systems  Constitutional: Negative.   Respiratory: Negative.   Cardiovascular: Negative.   Gastrointestinal: Positive for nausea and abdominal pain. Negative for vomiting, diarrhea, constipation, blood in stool and abdominal distention.  Musculoskeletal: Positive for arthralgias.       Objective:   Physical Exam  Constitutional: He appears well-developed and well-nourished.  Cardiovascular: Normal rate, regular rhythm, normal heart sounds and intact distal pulses.   Pulmonary/Chest: Effort normal and breath sounds normal.  Abdominal: Soft. Bowel sounds are normal. He exhibits no distension and no mass. There is no tenderness. There is no rebound and no guarding.          Assessment & Plan:  He probably has some gastritis, so we will try Omeprazole daily. For the joint pains, try Tramadol. Because of the hypoglycemia. We will stop the Glyburide. Check an A1c today. Try Gabapentin for the neuropathy.

## 2012-04-25 NOTE — Progress Notes (Signed)
Quick Note:  I spoke with pt ______ 

## 2012-05-06 ENCOUNTER — Telehealth: Payer: Self-pay | Admitting: Family Medicine

## 2012-05-06 NOTE — Telephone Encounter (Signed)
noted 

## 2012-05-06 NOTE — Telephone Encounter (Signed)
[address] [place], [place] [address] p. [phone] f. [phone] To: [facility] (After Hours Triage) Fax: [phone] From: [facility] Date/ Time: 05/05/2012 5:06 PM Taken By: Jethro BolusLupita Leash Facility: not collected Patient: [name] DOB: 09/17/1950 Phone: [phone] Reason for Call: Wanting to let Dr. Clent Ridges know that patient was off Glyburide 3-4 days and blood sugars have been going high. Has gone back on the Glyburide, does have refills left. Wanted to let Dr. Clent Ridges know this. Regarding Appointment: Appt Date: Appt Time: Unknown Provider: Reason: Details: Outcome:

## 2012-06-13 ENCOUNTER — Telehealth: Payer: Self-pay | Admitting: Family Medicine

## 2012-06-13 DIAGNOSIS — E119 Type 2 diabetes mellitus without complications: Secondary | ICD-10-CM | POA: Diagnosis not present

## 2012-06-13 MED ORDER — ACCU-CHEK SOFT TOUCH LANCETS MISC: Status: DC

## 2012-06-13 MED ORDER — GLUCOSE BLOOD VI STRP: ORAL_STRIP | Status: DC

## 2012-06-13 NOTE — Telephone Encounter (Signed)
I tried to reach pt by phone, no answer. 

## 2012-06-13 NOTE — Telephone Encounter (Signed)
Pt needs new script for lancets and test strips, coded so that Medicare will pay[facility] Pharm said certain ones Medicare will pay, and pt needs those particular brands. Pt's wife would like you to call her w/ questions [phone].

## 2012-06-13 NOTE — Telephone Encounter (Signed)
Refill request for Test Strips & lancets. I did send e-scribe.

## 2012-06-14 ENCOUNTER — Telehealth: Payer: Self-pay | Admitting: Family Medicine

## 2012-06-14 MED ORDER — ACCU-CHEK SOFT TOUCH LANCETS MISC: Status: AC

## 2012-06-14 MED ORDER — GLUCOSE BLOOD VI STRP: ORAL_STRIP | Status: AC

## 2012-06-14 NOTE — Telephone Encounter (Signed)
I had to resend scripts for test strips & lancets.

## 2012-08-18 ENCOUNTER — Other Ambulatory Visit: Payer: Self-pay | Admitting: Family Medicine

## 2012-08-22 NOTE — Telephone Encounter (Signed)
Call in #60 with 5 rf 

## 2012-08-23 ENCOUNTER — Ambulatory Visit (INDEPENDENT_AMBULATORY_CARE_PROVIDER_SITE_OTHER): Payer: Medicare Other | Admitting: Family Medicine

## 2012-08-23 ENCOUNTER — Encounter: Payer: Self-pay | Admitting: Family Medicine

## 2012-08-23 VITALS — BP 126/80 | HR 72 | Temp 98.6°F | Wt 216.0 lb

## 2012-08-23 DIAGNOSIS — I10 Essential (primary) hypertension: Secondary | ICD-10-CM | POA: Diagnosis not present

## 2012-08-23 DIAGNOSIS — I2510 Atherosclerotic heart disease of native coronary artery without angina pectoris: Secondary | ICD-10-CM | POA: Diagnosis not present

## 2012-08-23 DIAGNOSIS — H8110 Benign paroxysmal vertigo, unspecified ear: Secondary | ICD-10-CM | POA: Diagnosis not present

## 2012-08-23 MED ORDER — FLUTICASONE PROPIONATE 50 MCG/ACT NA SUSP: 2.0000 | Freq: Every day | NASAL | Status: DC

## 2012-08-23 NOTE — Progress Notes (Signed)
  Subjective:    Patient ID: [name], male    DOB: 12-05-1950, 62 y.o.   MRN: [id number]  HPI Here for several weeks of intermittent dizziness when he stands up or moves his head quickly. No HA or vision problems. He is very pleased with gabapentin and with how it has stopped the burning in his feet.    Review of Systems  Constitutional: Negative.   Respiratory: Negative.   Cardiovascular: Negative.   Neurological: Positive for dizziness. Negative for tremors, seizures, syncope, speech difficulty, weakness, light-headedness, numbness and headaches.       Objective:   Physical Exam  Constitutional: He is oriented to person, place, and time. He appears well-developed and well-nourished.  HENT:  Right Ear: External ear normal.  Left Ear: External ear normal.  Nose: Nose normal.  Mouth/Throat: Oropharynx is clear and moist.  Eyes: Conjunctivae are normal. Pupils are equal, round, and reactive to light.  Cardiovascular: Normal rate, regular rhythm, normal heart sounds and intact distal pulses.   Pulmonary/Chest: Effort normal and breath sounds normal.  Lymphadenopathy:    He has no cervical adenopathy.  Neurological: He is alert and oriented to person, place, and time. No cranial nerve deficit. Coordination normal.          Assessment & Plan:  This is vertigo caused by sinus congestion. Try Flonase and Claritin daily. Recheck prn

## 2012-08-24 ENCOUNTER — Telehealth: Payer: Self-pay | Admitting: Family Medicine

## 2012-08-24 ENCOUNTER — Ambulatory Visit: Payer: BC Managed Care – PPO | Admitting: Family Medicine

## 2012-08-24 NOTE — Telephone Encounter (Signed)
Patient Information:  Caller Name: [name]  Phone: [phone]  Patient: Thomas, Bennett  Gender: Male  DOB: [birth year]  Age: 62 Years  PCP: Gershon Crane [facility])  Office Follow Up:  Does the office need to follow up with this patient?: No  Instructions For The Office: N/A  RN Note:  Seen 08/22/12 for vertigo which is much better.  FBS 143; has not taken Metformin or other meds. Last emesis 2100 08/23/12 following 7-10 emesis.  Last BM 08/23/12; no diarrhea. Voided at 0330; urine is always dark even when not sick. Dry mouth.  Reports constant lower stomach pain for > 2 hours.   Symptoms  Reason For Call & Symptoms: Nausea and vomiting with fever.  Asking for medication.  Reviewed Health History In EMR: Yes  Reviewed Medications In EMR: Yes  Reviewed Allergies In EMR: Yes  Reviewed Surgeries / Procedures: Yes  Date of Onset of Symptoms: 08/23/2012  Treatments Tried: sugar free Ginger Ale  Treatments Tried Worked: No  Any Fever: Yes  Fever Taken: Oral  Fever Time Of Reading: 07:30:00  Fever Last Reading: 101  Guideline(s) Used:  Vomiting  Disposition Per Guideline:   Go to ED Now (or to Office with PCP Approval)  Reason For Disposition Reached:   Constant abdominal pain lasting > 2 hours  Advice Given:  N/A  RN Overrode Recommendation:  Go To ED  Diabletic with CAD: dark urine, very thirsty, weak following severe vomiting and constant lower abdominal pain for > 2 hours.

## 2012-08-29 DIAGNOSIS — E119 Type 2 diabetes mellitus without complications: Secondary | ICD-10-CM | POA: Diagnosis not present

## 2012-08-29 DIAGNOSIS — E785 Hyperlipidemia, unspecified: Secondary | ICD-10-CM | POA: Diagnosis not present

## 2012-08-29 DIAGNOSIS — R5381 Other malaise: Secondary | ICD-10-CM | POA: Diagnosis not present

## 2012-08-29 DIAGNOSIS — I119 Hypertensive heart disease without heart failure: Secondary | ICD-10-CM | POA: Diagnosis not present

## 2012-08-30 ENCOUNTER — Other Ambulatory Visit: Payer: Self-pay | Admitting: Family Medicine

## 2012-08-31 NOTE — Telephone Encounter (Signed)
Can we send this in ?

## 2012-10-28 ENCOUNTER — Telehealth: Payer: Self-pay | Admitting: Family Medicine

## 2012-10-28 MED ORDER — TRAMADOL HCL 50 MG PO TABS: 100.0000 mg | ORAL_TABLET | Freq: Four times a day (QID) | ORAL | Status: DC | PRN

## 2012-10-28 NOTE — Telephone Encounter (Signed)
Call in #240 with 5 rf 

## 2012-10-28 NOTE — Telephone Encounter (Signed)
Rx sent to pharmacy   

## 2012-10-28 NOTE — Telephone Encounter (Signed)
PT's wife called to request a refill of his traMADol (ULTRAM) 50 MG tablet. Please assist.

## 2012-11-24 ENCOUNTER — Other Ambulatory Visit: Payer: Self-pay | Admitting: Family Medicine

## 2012-11-24 ENCOUNTER — Telehealth: Payer: Self-pay | Admitting: Family Medicine

## 2012-11-24 NOTE — Telephone Encounter (Signed)
PT wife called and stated that the patient is 3 days away from being out of his gabapentin (NEURONTIN) 300 MG capsule. She states that the pharmacy will not refill because its too early, please assist.

## 2012-11-24 NOTE — Telephone Encounter (Signed)
I just sent refill request e-scribe and spoke with pt.

## 2012-11-25 ENCOUNTER — Other Ambulatory Visit: Payer: Self-pay | Admitting: Family Medicine

## 2012-11-25 NOTE — Telephone Encounter (Signed)
NO because we gave him a 6 month supply last month

## 2012-11-29 ENCOUNTER — Ambulatory Visit: Payer: Medicare Other | Admitting: Cardiovascular Disease

## 2012-12-01 ENCOUNTER — Encounter: Payer: Self-pay | Admitting: *Deleted

## 2012-12-02 ENCOUNTER — Encounter: Payer: Self-pay | Admitting: Cardiovascular Disease

## 2012-12-02 ENCOUNTER — Ambulatory Visit (INDEPENDENT_AMBULATORY_CARE_PROVIDER_SITE_OTHER): Payer: Medicare Other | Admitting: Cardiovascular Disease

## 2012-12-02 VITALS — BP 112/78 | HR 55 | Ht 69.0 in | Wt 215.0 lb

## 2012-12-02 DIAGNOSIS — I2510 Atherosclerotic heart disease of native coronary artery without angina pectoris: Secondary | ICD-10-CM | POA: Diagnosis not present

## 2012-12-02 DIAGNOSIS — I10 Essential (primary) hypertension: Secondary | ICD-10-CM

## 2012-12-02 DIAGNOSIS — E119 Type 2 diabetes mellitus without complications: Secondary | ICD-10-CM

## 2012-12-02 DIAGNOSIS — E785 Hyperlipidemia, unspecified: Secondary | ICD-10-CM

## 2012-12-02 MED ORDER — SIMVASTATIN 40 MG PO TABS: 40.0000 mg | ORAL_TABLET | Freq: Every day | ORAL | Status: DC

## 2012-12-02 NOTE — Progress Notes (Signed)
Patient ID: [name], male   DOB: 09/17/1950, 62 y.o.   MRN: [id number]  HPI: [name], is a 62 y.o. male who presents to the office today for cardiology evaluation. I last saw him on 05/06/2012.  [name] is now 62 years old. In June 2002 he underwent CABG surgery by Dr. [name] and had a sequential LIMA graft placed to the second diagonal and LAD, sequential radial graft to the first and third diagonal branches. Nuclear perfusion study was done in March 2013 and this showed normal perfusion and function without scar or ischemia  Additional problems include hypertension, type 2 diabetes mellitus, hyperlipidemia., He has had a issues with statin intolerance. Did perform an NMR lipoprotein a separate 25 2014 which showed an LDL particle number increased at 1538 LDL cholesterol 97 HDL cholesterol 33 triglycerides 91 total cholesterol 148 reduced HDL particle #24 a significant increase in small LDL particles at 1021 and an increased insulin resistance score at 60. Apparently, since that time he has tried taking his wife's simvastatin 40 mg and he seems to tolerate this without his previous myalgias that he experienced remotely. He tells me he recently saw Dr. Clent Ridges and was found to have vertigo which did improve with Flonase nasal spray. He also was started on gabapentin for peripheral neuropathy which has helped as well as helping his previous restless legs. Resume, he denies chest pain, PND orthopnea. He denies palpitations; he denies shortness of breath  Past Medical History  Diagnosis Date  . Hyperlipidemia   . Diabetes mellitus   . Insomnia   . Chronic headache   . Chronic neck pain   . Hypertension 12/26/08    ECHO- EF>55%  . CAD (coronary artery disease)     nuclear stress test-09/09/11 low risk scan EF61%    Past Surgical History  Procedure Laterality Date  . Appendectomy    . Coronary artery bypass graft  2002    4 vessels  . Cervical spine injection    . Liver  cancer      PATIENT NEVER HAD LIVER CANCER. ENTERED MISTAKENLY.    Allergies  Allergen Reactions  . Ciprofloxacin     REACTION: rash  . Codeine   . Crestor (Rosuvastatin)   . Lipitor (Atorvastatin)   . Zolpidem Tartrate     REACTION: HA's    Current Outpatient Prescriptions  Medication Sig Dispense Refill  . aspirin 81 MG tablet Take 81 mg by mouth daily.       . cholecalciferol (VITAMIN D) 1000 UNITS tablet Take 1,000 Units by mouth 2 (two) times daily.        . clonazePAM (KLONOPIN) 1 MG tablet TAKE 1 TABLET BY MOUTH TWICE A DAY AS NEEDED FOR ANXIETY  60 tablet  5  . diclofenac sodium (VOLTAREN) 1 % GEL Apply 1 application topically as needed.  100 g  11  . ezetimibe (ZETIA) 10 MG tablet Take 10 mg by mouth daily.        . fish oil-omega-3 fatty acids 1000 MG capsule Take 2 g by mouth daily.        . fluticasone (FLONASE) 50 MCG/ACT nasal spray Place 2 sprays into the nose daily.  16 g  11  . gabapentin (NEURONTIN) 300 MG capsule Take 300 mg by mouth 2 (two) times daily.      Marland Kitchen glucose blood (ACCU-CHEK ADVANTAGE TEST) test strip Use as instructed  100 each  3  . glyBURIDE (DIABETA) 5 MG tablet TAKE  1 TABLET BY MOUTH TWICE A DAY  60 tablet  9  . Lancets (ACCU-CHEK SOFT TOUCH) lancets Use as instructed  100 each  3  . lisinopril (PRINIVIL,ZESTRIL) 20 MG tablet Take 30 mg by mouth daily.      . metFORMIN (GLUCOPHAGE) 1000 MG tablet Take 1 tablet (1,000 mg total) by mouth 2 (two) times daily with a meal.  60 tablet  11  . Multiple Vitamin (MULTIVITAMIN) tablet Take 1 tablet by mouth daily.        . nebivolol (BYSTOLIC) 5 MG tablet Take 5 mg by mouth daily.        . traMADol (ULTRAM) 50 MG tablet Take 2 tablets (100 mg total) by mouth every 6 (six) hours as needed for pain.  240 tablet  5  . simvastatin (ZOCOR) 40 MG tablet Take 1 tablet (40 mg total) by mouth at bedtime.  30 tablet  6   No current facility-administered medications for this visit.    Socially he is 3 children 8  grandchildren. There is no recent tobacco or alcohol use the is not routinely exercise.  ROS as noted above with reference to cardiovascular system review. In addition, he denies fever chills night sweats. He states his diabetes has been controlled. He denies peripheral edema. He denies wheezing. He denies tachycardia palpitations. He denies bleeding. Peripheral neuropathy has improved peer he denies motor symptoms appear other system review is negative. 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  PE BP 112/78  Pulse 55  Ht 5\' 9"  (1.753 m)  Wt 215 lb (97.523 kg)  BMI 31.74 kg/m2  General: Alert, oriented, no distress.  HEENT: Normocephalic, atraumatic. Pupils round and reactive; sclera anicteric; Fundi ** Nose without nasal septal hypertrophy Mouth/Parynx benign; Mallinpatti scale** Neck: No JVD, no carotid briuts Lungs: clear to ausculatation and percussion; no wheezing or rales Heart: RRR, s1 s2 normal  Abdomen: soft, nontender; no hepatosplenomehaly, BS+; abdominal aorta nontender and not dilated by palpation. Pulses 2+ Extremities: no clubbinbg cyanosis or edema, Homan's sign negative  Neurologic: grossly nonfocal  ECG: Sinus bradycardia 55 beats per minute. Nondiagnostic ST changes. QTc interval 420 ms.  LABS:  BMET    Component Value Date/Time   NA 140 10/29/2011 1028   K 4.2 10/29/2011 1028   CL 105 10/29/2011 1028   CO2 25 10/29/2011 1028   GLUCOSE 205* 10/29/2011 1028   BUN 11 10/29/2011 1028   CREATININE 0.8 10/29/2011 1028   CALCIUM 9.2 10/29/2011 1028   GFRNONAA 104.79 10/16/2009 1518   GFRAA 128 08/21/2008 0932     Hepatic Function Panel     Component Value Date/Time   PROT 7.2 10/29/2011 1028   ALBUMIN 4.1 10/29/2011 1028   AST 36 10/29/2011 1028   ALT 44 10/29/2011 1028   ALKPHOS 99 10/29/2011 1028   BILITOT 0.3 10/29/2011 1028   BILIDIR 0.0 10/29/2011 1028     CBC    Component Value  Date/Time   WBC 5.4 10/29/2011 1028   RBC 4.89 10/29/2011 1028   HGB 15.7 10/29/2011 1028   HCT 46.1 10/29/2011 1028   PLT 146.0* 10/29/2011 1028   MCV 94.4 10/29/2011 1028   MCHC 34.0 10/29/2011 1028   RDW 13.0 10/29/2011 1028   LYMPHSABS 1.2 10/29/2011 1028   MONOABS 0.8 10/29/2011 1028   EOSABS 0.1 10/29/2011 1028   BASOSABS 0.0 10/29/2011 1028     BNP No results  found for this basename: probnp    Lipid Panel     Component Value Date/Time   CHOL 188 10/29/2011 1028   TRIG 211.0* 10/29/2011 1028   HDL 39.80 10/29/2011 1028   CHOLHDL 5 10/29/2011 1028   VLDL 42.2* 10/29/2011 1028     RADIOLOGY: No results found.    ASSESSMENT AND PLAN: The cardiac standpoint, Mr. Loiseau continues to do well. He is now approximately 14 years following his CABG surgery use of bilateral arterial conduits. He now seems to be able to tolerate his simvastatin 40 mg. He is still taking Zetia.  I will recheck his NMR lipoprotein the next month as well as a c-met for further evaluation.  His last nuclear perfusion study was one year ago and I will not repeat this presently. His blood pressure is well-controlled. He tells me Dr. Clent Ridges has been following his diabetes closely. Target LDL is less than 70 ideal LDL particle number less than 700 in this patient with established coronary artery disease per discussed importance of increased activity and exercise. Body mass index is still elevated and discussed importance of additional weight loss to get under the obesity threshold. I will see him in 6 months for Cardiologic evaluation.     Lennette Bihari, MD, [facility]  12/02/2012 10:44 AM

## 2012-12-02 NOTE — Patient Instructions (Signed)
Your physician recommends that you schedule a follow-up appointment in: 6 months.  Your physician recommends that you return for lab work.  Your physician has recommended you make the following change in your medication: Prescription for atorvastatin has been sent to your pharmacy.

## 2012-12-05 ENCOUNTER — Other Ambulatory Visit: Payer: Self-pay | Admitting: Cardiovascular Disease

## 2013-01-09 ENCOUNTER — Telehealth: Payer: Self-pay | Admitting: Cardiovascular Disease

## 2013-01-09 NOTE — Telephone Encounter (Signed)
Would like to get some samples of Bystolic-10mg  ..   Thanks

## 2013-01-10 MED ORDER — NEBIVOLOL HCL 10 MG PO TABS: 10.0000 mg | ORAL_TABLET | Freq: Every day | ORAL | Status: DC

## 2013-01-10 NOTE — Telephone Encounter (Signed)
Samples left at front desk for pt pick up.  Lot: [id number] Exp: 12/2014.  Returned call and pt informed samples left at front desk.  Pt verbalized understanding and agreed w/ plan.

## 2013-01-28 ENCOUNTER — Other Ambulatory Visit: Payer: Self-pay | Admitting: Family Medicine

## 2013-01-30 NOTE — Telephone Encounter (Signed)
Can we refill these? 

## 2013-02-23 ENCOUNTER — Other Ambulatory Visit: Payer: Self-pay | Admitting: Family Medicine

## 2013-02-23 NOTE — Telephone Encounter (Signed)
Call in #60 with 5 rf 

## 2013-02-27 ENCOUNTER — Other Ambulatory Visit: Payer: Self-pay | Admitting: Family Medicine

## 2013-03-01 ENCOUNTER — Other Ambulatory Visit: Payer: Self-pay | Admitting: Family Medicine

## 2013-03-01 NOTE — Telephone Encounter (Signed)
PT wife called and stated that the pharmacy will not utilize the patients last two refills of this medication due to the fact that it is now a controlled substance. They will need to hear directly from [place] in order to refill his medication. Please assist.

## 2013-03-02 NOTE — Telephone Encounter (Signed)
Call in #240 with 5 rf 

## 2013-03-07 ENCOUNTER — Telehealth: Payer: Self-pay | Admitting: Cardiovascular Disease

## 2013-03-07 MED ORDER — NEBIVOLOL HCL 10 MG PO TABS: 10.0000 mg | ORAL_TABLET | Freq: Every day | ORAL | Status: DC

## 2013-03-07 NOTE — Telephone Encounter (Signed)
5 samples of Bystolic 10mg  (Lot#[id number], EXP-04/2015) left at front desk. Patient notified.

## 2013-03-07 NOTE — Telephone Encounter (Signed)
Pt is calling regarding her husband's samples of Bystolic and he is about out of the medication. Needs some more samples of 10 mg.

## 2013-03-20 ENCOUNTER — Other Ambulatory Visit: Payer: Self-pay | Admitting: Family Medicine

## 2013-03-31 ENCOUNTER — Emergency Department (INDEPENDENT_AMBULATORY_CARE_PROVIDER_SITE_OTHER): Payer: Medicare Other

## 2013-03-31 ENCOUNTER — Encounter (HOSPITAL_COMMUNITY): Payer: Self-pay | Admitting: Emergency Medicine

## 2013-03-31 ENCOUNTER — Emergency Department (INDEPENDENT_AMBULATORY_CARE_PROVIDER_SITE_OTHER)
Admission: EM | Admit: 2013-03-31 | Discharge: 2013-03-31 | Disposition: A | Discharge status: Home / Self Care | Payer: Medicare Other | Source: Home / Self Care | Attending: Emergency Medicine | Admitting: Emergency Medicine

## 2013-03-31 DIAGNOSIS — S62639B Displaced fracture of distal phalanx of unspecified finger, initial encounter for open fracture: Secondary | ICD-10-CM

## 2013-03-31 DIAGNOSIS — Z23 Encounter for immunization: Secondary | ICD-10-CM

## 2013-03-31 DIAGNOSIS — S62639A Displaced fracture of distal phalanx of unspecified finger, initial encounter for closed fracture: Secondary | ICD-10-CM | POA: Diagnosis not present

## 2013-03-31 DIAGNOSIS — IMO0002 Reserved for concepts with insufficient information to code with codable children: Secondary | ICD-10-CM

## 2013-03-31 DIAGNOSIS — T148XXA Other injury of unspecified body region, initial encounter: Secondary | ICD-10-CM

## 2013-03-31 LAB — GLUCOSE, CAPILLARY: Glucose-Capillary: 109 mg/dL — ABNORMAL HIGH (ref 70–99)

## 2013-03-31 MED ORDER — TETANUS-DIPHTH-ACELL PERTUSSIS 5-2.5-18.5 LF-MCG/0.5 IM SUSP
0.5000 mL | Freq: Once | INTRAMUSCULAR | Status: AC
  Administered 2013-03-31: 0.5 mL via INTRAMUSCULAR

## 2013-03-31 MED ORDER — OXYCODONE-ACETAMINOPHEN 5-325 MG PO TABS: ORAL_TABLET | ORAL | Status: DC

## 2013-03-31 MED ORDER — CEPHALEXIN 500 MG PO CAPS: 500.0000 mg | ORAL_CAPSULE | Freq: Three times a day (TID) | ORAL | Status: DC

## 2013-03-31 MED ORDER — TETANUS-DIPHTH-ACELL PERTUSSIS 5-2.5-18.5 LF-MCG/0.5 IM SUSP
INTRAMUSCULAR | Status: AC
  Filled 2013-03-31: qty 0.5

## 2013-03-31 NOTE — ED Provider Notes (Signed)
Chief Complaint:   Chief Complaint  Patient presents with  . Laceration    History of Present Illness:   [name] is a 62 year old male who lacerated his left middle finger at around 1:10 PM today on a saw at home. He has cut the tip of the finger and his last tetanus vaccine was about 10 years ago.  Review of Systems:  Other than noted above, the patient denies any of the following symptoms: Systemic:  No fever or chills. Musculoskeletal:  No joint pain or decreased range of motion. Neuro:  No numbness, tingling, or weakness.  PMFSH:  Past medical history, family history, social history, meds, and allergies were reviewed. He is allergic to Cipro and codeine. He has diabetes, heart problems, high blood pressure. He takes aspirin, vitamin D, Klonopin, Voltaren gel, Zetia, fish oil, Flonase, Neurontin, glyburide, lisinopril, metformin, Bystolic, Zocor, and tramadol.  Physical Exam:   Vital signs:  There were no vitals taken for this visit. Ext:  He has a flap laceration on the side of the finger measuring 1 cm on each side. There is some skin loss at the base of the finger.  All other joints had a full ROM without pain.  Pulses were full.  Good capillary refill in all digits.  No edema. Neurological:  Alert and oriented.  No muscle weakness.  Sensation was intact to light touch.   Dg Finger Middle Left  03/31/2013   CLINICAL DATA:  Finger injury with saw  EXAM: LEFT MIDDLE FINGER 2+V  COMPARISON:  None.  FINDINGS: Fracture of the distal 3rd phalanx on the lateral side. This does not extend all the way across the phalanx. Fracture is mildly comminuted. No definite foreign body.  IMPRESSION: Incomplete fracture distal phalanx.   Electronically Signed   By: Marlan Palau M.D.   On: 03/31/2013 14:32     Procedure: Verbal informed consent was obtained.  The patient was informed of the risks and benefits of the procedure and understands and accepts.  Identity of the patient was verified  verbally and by wristband.   The laceration area described above was prepped with Betadine and saline  and anesthetized with a digital block with 5 mL of 2% Xylocaine without epinephrine.  The wound was then closed as follows:  Using 5-0 nylon sutures, the wound was closed as follows. The base of the flap was closed with a corner stitch. Then 2 stitches were placed along the nail fold, extending into the nail. Then 3 stitches were placed on the side of the flap nearest the finger pad that would this together. There remained an area of skin loss at the base of the nail measuring approximately 5 mm x 8 mm which was left open, since there was no skin to suture to this.  There were no immediate complications, and the patient tolerated the procedure well. The laceration was then cleansed, Bacitracin ointment was applied and a clean, dry pressure dressing was put on. The patient's finger was then splinted.  Course in Urgent Care Center:   Given a Tdap vaccine.  Assessment:  The primary encounter diagnosis was Fracture of distal phalanx of finger, open, initial encounter. A diagnosis of Laceration was also pertinent to this visit.  Dr. Izora Ribas the hand surgeon on call was called and he was in agreement with this management. He will followup in his office, beginning next Monday which is 4 days from now.  Plan:   1.  Meds:  The following meds  were prescribed:   Discharge Medication List as of 03/31/2013  3:36 PM    START taking these medications   Details  cephALEXin (KEFLEX) 500 MG capsule Take 1 capsule (500 mg total) by mouth 3 (three) times daily., Starting 03/31/2013, Until Discontinued, Normal    oxyCODONE-acetaminophen (PERCOCET) 5-325 MG per tablet 1 to 2 tablets every 6 hours as needed for pain., Print        2.  Patient Education/Counseling:  The patient was given appropriate handouts, self care instructions, and instructed in symptomatic relief. Instructions were given for wound care.  He will  leave the dressing and the splint in place until he sees Dr. Izora Ribas.  3.  Follow up:  The patient was told to follow up immediately if there is any sign of infection.The patient will return to see Dr. Izora Ribas for all of his followup.      Reuben Likes, MD 03/31/13 2242

## 2013-03-31 NOTE — ED Notes (Signed)
Pt  Sustained   A  Laceration to  His   l  Middle  Finger           By a  Power  Saw               Bleeding       Has  Subsided               Nail  Bed  Involved

## 2013-04-03 DIAGNOSIS — S61209A Unspecified open wound of unspecified finger without damage to nail, initial encounter: Secondary | ICD-10-CM | POA: Diagnosis not present

## 2013-04-12 DIAGNOSIS — S61209A Unspecified open wound of unspecified finger without damage to nail, initial encounter: Secondary | ICD-10-CM | POA: Diagnosis not present

## 2013-04-26 DIAGNOSIS — S61209A Unspecified open wound of unspecified finger without damage to nail, initial encounter: Secondary | ICD-10-CM | POA: Diagnosis not present

## 2013-04-28 ENCOUNTER — Encounter: Payer: Self-pay | Admitting: Family Medicine

## 2013-04-28 ENCOUNTER — Ambulatory Visit (INDEPENDENT_AMBULATORY_CARE_PROVIDER_SITE_OTHER): Payer: Medicare Other | Admitting: Family Medicine

## 2013-04-28 VITALS — BP 130/80 | HR 67 | Temp 98.1°F | Wt 208.0 lb

## 2013-04-28 DIAGNOSIS — J209 Acute bronchitis, unspecified: Secondary | ICD-10-CM | POA: Diagnosis not present

## 2013-04-28 MED ORDER — AZITHROMYCIN 250 MG PO TABS: ORAL_TABLET | ORAL | Status: DC

## 2013-04-28 MED ORDER — HYDROCODONE-HOMATROPINE 5-1.5 MG/5ML PO SYRP: 5.0000 mL | ORAL_SOLUTION | ORAL | Status: DC | PRN

## 2013-04-28 NOTE — Progress Notes (Signed)
  Subjective:    Patient ID: [name], male    DOB: December 05, 1950, 62 y.o.   MRN: [id number]  HPI Here for 4 days of PND, ST, and a dry cough. No fever.   Review of Systems  Constitutional: Negative.   HENT: Positive for congestion and postnasal drip.   Eyes: Negative.   Respiratory: Positive for cough.        Objective:   Physical Exam  Constitutional: He appears well-developed and well-nourished.  HENT:  Right Ear: External ear normal.  Left Ear: External ear normal.  Nose: Nose normal.  Mouth/Throat: Oropharynx is clear and moist.  Eyes: Conjunctivae are normal.  Pulmonary/Chest: Effort normal and breath sounds normal.  Lymphadenopathy:    He has no cervical adenopathy.          Assessment & Plan:  Add Mucinex

## 2013-05-04 ENCOUNTER — Telehealth: Payer: Self-pay | Admitting: Family Medicine

## 2013-05-05 ENCOUNTER — Telehealth: Payer: Self-pay | Admitting: Family Medicine

## 2013-05-05 MED ORDER — AMOXICILLIN-POT CLAVULANATE 875-125 MG PO TABS: 1.0000 | ORAL_TABLET | Freq: Two times a day (BID) | ORAL | Status: DC

## 2013-05-05 NOTE — Telephone Encounter (Signed)
Can you call [name] at below number and schedule the CPE for next week, okay per Dr. Clent Ridges.

## 2013-05-05 NOTE — Telephone Encounter (Signed)
I spoke with [name] and sent script e-scribe to [facility].

## 2013-05-05 NOTE — Telephone Encounter (Signed)
[facility] Triage Call Report Triage Record Num: [id number] Operator: Hillary Bow Patient Name: [facility] Call Date & Time: 05/04/2013 5:28:18PM Patient Phone: [phone] PCP: Tera Mater. Clent Ridges Patient Gender: Male PCP Fax : [phone] Patient DOB: 14-May-1951 [facility] Name: [facility] Reason for Call: Caller: Sheran Spine; PCP: Gershon Crane [facility]); CB#: [phone]; Call regarding F/U Bronchitis, finished Zpack on 10-28. Seen in office on 10-24. Pt has tactile fever w/ persistant cough, white sputum. Pt would like to have another Zpack RX. All emergent sxs ruled out per Cough protocol, see in 24 hrs. No standing orders for Cough. Advised Pt to f/u w/ Office on 10-31 when open. Pt verbalized understanding. Protocol(s) Used: Cough - Adult Recommended Outcome per Protocol: See Provider within 24 hours Reason for Outcome: Productive cough with colored sputum (other than clear or white sputum) Care Advice: ~ 10/

## 2013-05-05 NOTE — Telephone Encounter (Signed)
appt scheduled

## 2013-05-05 NOTE — Telephone Encounter (Signed)
Pt is still not feeling well. Wife states she would like to bring pt in for blood work prior to 12/11 which she was told first available cpe. Is it ok to make cpe next week or wait until 12/11? Pt has finished antibiotic and still has bad cough, weak, not feeling weak, no appetite.

## 2013-05-05 NOTE — Telephone Encounter (Signed)
See previous phone note, script was sent to pharmacy and I spoke with [name].

## 2013-05-05 NOTE — Telephone Encounter (Signed)
[facility] Triage Call Report Triage Record Num: [id number] Operator: Hillary Bow Patient Name: [name] Call Date & Time: 05/04/2013 5:28:18PM Patient Phone: [phone] PCP: Tera Mater. Clent Ridges Patient Gender: Male PCP Fax : [phone] Patient DOB: 1950/10/17 [facility] Name: [facility] Reason for Call: Caller: Sheran Spine; PCP: Gershon Crane [facility]); CB#: [phone]; Call regarding F/U Bronchitis, finished Zpack on 10-28. Seen in office on 10-24. Pt has tactile fever w/ persistant cough, white sputum. Pt would like to have another Zpack RX. All emergent sxs ruled out per Cough protocol, see in 24 hrs. No standing orders for Cough. Advised Pt to f/u w/ Office on 10-31 when open. Pt verbalized understanding. Protocol(s) Used: Cough - Adult Recommended Outcome per Protocol: See Provider within 24 hours Reason for Outcome: Productive cough with colored sputum (other than clear or white sputum)

## 2013-05-05 NOTE — Telephone Encounter (Signed)
As for the current infection, call in Augmentin 875 bid for 10 days. We can work him in for a cpx next week sometime

## 2013-05-10 ENCOUNTER — Telehealth: Payer: Self-pay | Admitting: Family Medicine

## 2013-05-10 NOTE — Telephone Encounter (Signed)
Call in another 240 ml bottle

## 2013-05-10 NOTE — Telephone Encounter (Signed)
Pt needs new rx hydromet.

## 2013-05-11 MED ORDER — HYDROCODONE-HOMATROPINE 5-1.5 MG/5ML PO SYRP: 5.0000 mL | ORAL_SOLUTION | ORAL | Status: DC | PRN

## 2013-05-11 NOTE — Telephone Encounter (Signed)
done

## 2013-05-11 NOTE — Telephone Encounter (Signed)
Does pt need to pu this rx?  Pharm said yes! Pt following up.

## 2013-05-12 NOTE — Telephone Encounter (Signed)
Script is ready for pick up and I left a voice message.  

## 2013-05-20 ENCOUNTER — Other Ambulatory Visit: Payer: Self-pay | Admitting: Family Medicine

## 2013-05-24 ENCOUNTER — Encounter: Payer: Self-pay | Admitting: Cardiovascular Disease

## 2013-05-24 ENCOUNTER — Ambulatory Visit (INDEPENDENT_AMBULATORY_CARE_PROVIDER_SITE_OTHER): Payer: Medicare Other | Admitting: Cardiovascular Disease

## 2013-05-24 VITALS — BP 118/78 | HR 58 | Ht 69.0 in | Wt 210.9 lb

## 2013-05-24 DIAGNOSIS — E785 Hyperlipidemia, unspecified: Secondary | ICD-10-CM

## 2013-05-24 DIAGNOSIS — E782 Mixed hyperlipidemia: Secondary | ICD-10-CM

## 2013-05-24 DIAGNOSIS — I119 Hypertensive heart disease without heart failure: Secondary | ICD-10-CM | POA: Diagnosis not present

## 2013-05-24 DIAGNOSIS — R5381 Other malaise: Secondary | ICD-10-CM

## 2013-05-24 DIAGNOSIS — I2510 Atherosclerotic heart disease of native coronary artery without angina pectoris: Secondary | ICD-10-CM | POA: Diagnosis not present

## 2013-05-24 DIAGNOSIS — E119 Type 2 diabetes mellitus without complications: Secondary | ICD-10-CM

## 2013-05-24 DIAGNOSIS — I10 Essential (primary) hypertension: Secondary | ICD-10-CM

## 2013-05-24 LAB — CBC
MCH: 31.6 pg (ref 26.0–34.0)
MCHC: 35.1 g/dL (ref 30.0–36.0)
MCV: 89.9 fL (ref 78.0–100.0)
Platelets: 222 10*3/uL (ref 150–400)
RBC: 5.07 MIL/uL (ref 4.22–5.81)
RDW: 13.8 % (ref 11.5–15.5)

## 2013-05-24 LAB — COMPREHENSIVE METABOLIC PANEL
ALT: 39 U/L (ref 0–53)
Albumin: 4.6 g/dL (ref 3.5–5.2)
Alkaline Phosphatase: 82 U/L (ref 39–117)
CO2: 30 mEq/L (ref 19–32)
Calcium: 10 mg/dL (ref 8.4–10.5)
Chloride: 101 mEq/L (ref 96–112)
Glucose, Bld: 120 mg/dL — ABNORMAL HIGH (ref 70–99)
Potassium: 4.6 mEq/L (ref 3.5–5.3)
Sodium: 142 mEq/L (ref 135–145)
Total Bilirubin: 0.4 mg/dL (ref 0.3–1.2)
Total Protein: 7.3 g/dL (ref 6.0–8.3)

## 2013-05-24 LAB — TSH: TSH: 4.311 u[IU]/mL (ref 0.350–4.500)

## 2013-05-24 MED ORDER — NEBIVOLOL HCL 10 MG PO TABS: 10.0000 mg | ORAL_TABLET | Freq: Every day | ORAL | Status: DC

## 2013-05-24 NOTE — Progress Notes (Signed)
Patient ID: [name], male   DOB: 1951/01/29, 62 y.o.   MRN: [id number]     HPI: [name] is a 62 y.o. male presents to the office today for one-year cardiology evaluation. He is a life of my patient Ms. [name].  [name] has established coronary artery disease in June 2002 underwent CABG surgery by Dr. [name] of the sequential LIMA graft to the second diagonal and LAD, and sequential radial graft to the first and third diagonal branches. His last nuclear perfusion study was done in March 2013 which continued to show normal perfusion and function without scar or ischemia.  Additional problems include hypertension, type 2 diabetes mellitus, and hyperlipidemia. Remotely, he had worked as a Haematologist minor and in the past was some question of possible black lung. 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  Recently he did have sinus infection for which she had taken Z-Pak and most recently has been taking Augmentin.  Over the past year, he denies anginal symptoms. He denies shortness of breath. He has lost weight. He is down approximately 25 pounds from his pre-CABG weight. His waist size has been reduced to 36 greater than 38 previously. He denies chest pain. He denies presyncope. Apparently now has been on low dose simvastatin. In the past he did have some difficulty with myalgias with multiple statin drugs.  Past Medical History  Diagnosis Date  . Hyperlipidemia   . Diabetes mellitus   . Insomnia   . Chronic headache   . Chronic neck pain   . Hypertension 12/26/08    ECHO- EF>55%  . CAD (coronary artery disease)     nuclear stress test-09/09/11 low risk scan EF61%    Past Surgical History  Procedure Laterality Date  . Appendectomy    . Coronary artery bypass graft  2002    4 vessels  . Cervical spine injection    . Liver cancer      PATIENT NEVER HAD LIVER CANCER. ENTERED MISTAKENLY.    Allergies  Allergen  Reactions  . Ciprofloxacin     REACTION: rash  . Codeine   . Crestor [Rosuvastatin]   . Lipitor [Atorvastatin]   . Zolpidem Tartrate     REACTION: HA's    Current Outpatient Prescriptions  Medication Sig Dispense Refill  . amoxicillin-clavulanate (AUGMENTIN) 875-125 MG per tablet Take 1 tablet by mouth 2 (two) times daily.  20 tablet  0  . aspirin 81 MG tablet Take 81 mg by mouth daily.       . cholecalciferol (VITAMIN D) 1000 UNITS tablet Take 1,000 Units by mouth 2 (two) times daily.        . clonazePAM (KLONOPIN) 1 MG tablet TAKE 1 TABLET TWICE A DAY AS NEEDED  60 tablet  5  . fish oil-omega-3 fatty acids 1000 MG capsule Take 2 g by mouth daily.        . fluticasone (FLONASE) 50 MCG/ACT nasal spray Place 2 sprays into the nose daily.  16 g  11  . gabapentin (NEURONTIN) 300 MG capsule TAKE 1 CAPSULE BY MOUTH TWICE A DAY  60 capsule  6  . glucose blood (ACCU-CHEK ADVANTAGE TEST) test strip Use as instructed  100 each  3  . glyBURIDE (DIABETA) 5 MG tablet TAKE 1 TABLET BY MOUTH TWICE A DAY  60 tablet  9  . HYDROcodone-homatropine (HYDROMET) 5-1.5 MG/5ML syrup Take 5 mLs by mouth every 4 (four) hours as needed for  cough.  240 mL  0  . Lancets (ACCU-CHEK SOFT TOUCH) lancets Use as instructed  100 each  3  . lisinopril (PRINIVIL,ZESTRIL) 20 MG tablet TAKE 1&1/2 TABLETS BY MOUTH DAILY  135 tablet  3  . loratadine-pseudoephedrine (CLARITIN-D 24-HOUR) 10-240 MG per 24 hr tablet Take 1 tablet by mouth daily.      . metFORMIN (GLUCOPHAGE) 1000 MG tablet Take 1 tablet (1,000 mg total) by mouth 2 (two) times daily with a meal.  60 tablet  11  . Multiple Vitamin (MULTIVITAMIN) tablet Take 1 tablet by mouth daily.        . nebivolol (BYSTOLIC) 10 MG tablet Take 1 tablet (10 mg total) by mouth daily.  35 tablet  0  . simvastatin (ZOCOR) 40 MG tablet Take 1 tablet (40 mg total) by mouth at bedtime.  30 tablet  6  . traMADol (ULTRAM) 50 MG tablet TAKE 2 TABLETS BY MOUTH EVERY 6 HOURS AS NEEDED FOR  PAIN.  240 tablet  5   No current facility-administered medications for this visit.    History   Social History  . Marital Status: Married    Spouse Name: N/A    Number of Children: N/A  . Years of Education: N/A   Occupational History  . Not on file.   Social History Main Topics  . Smoking status: Former Smoker    Quit date: 11/26/1970  . Smokeless tobacco: Never Used  . Alcohol Use: No  . Drug Use: No  . Sexual Activity: Not on file   Other Topics Concern  . Not on file   Social History Narrative  . No narrative on file    Family History  Problem Relation Age of Onset  . Coronary artery disease    . Hypertension    . Heart disease Mother   . Liver cancer Father   . Cancer Brother   . Cancer Brother   . Cancer Brother    Socially he is married. He has 3 children 8 grandchildren and 2 great-grandchildren. There is no tobacco or alcohol use. He is now retired.   ROS is negative for fevers, chills or night sweats. He denies rash. 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 Early this has stabilized following antibiotic therapy and suturing. He also status post recent sinusitis for which she currently is still being treated with Augmentin. He denies presyncope or syncope. He denies PND orthopnea. He denies anginal symptoms. He denies blood in his stool or urine. He denies change in bowel habits. He has been able to tolerate simvastatin and when he does note an occasional myalgia he stopped taking this for several days and then resumes. He denies thyroid issues. He does have a history of diabetes.  Other comprehensive 12 point system review is negative.  PE BP 118/78  Pulse 58  Ht 5\' 9"  (1.753 m)  Wt 210 lb 14.4 oz (95.664 kg)  BMI 31.13 kg/m2  General: Alert, oriented, no distress.  Skin: normal turgor, no rashes HEENT: Normocephalic, atraumatic. Pupils round and reactive; sclera anicteric;no lid  lag.  Nose without nasal septal hypertrophy Mouth/Parynx benign; Mallinpatti scale 3 Neck: No JVD, no carotid briuts Lungs: clear to ausculatation and percussion; no wheezing or rales Heart: RRR, s1 s2 normal over 6 systolic murmur Abdomen: soft, nontender; no hepatosplenomehaly, BS+; abdominal aorta nontender and not dilated by palpation. Pulses 2+ Extremities: no clubbing cyanosis or  edema, Homan's sign negative; healing distal aspect of his third left finger from his recent trauma Neurologic: grossly nonfocal Psychologic: normal affect and mood.  ECG: Normal sinus rhythm at 58 beats per minute. QTc interval 428 ms. PR interval 170 ms. Nonspecific ST changes  LABS:  BMET    Component Value Date/Time   NA 140 10/29/2011 1028   K 4.2 10/29/2011 1028   CL 105 10/29/2011 1028   CO2 25 10/29/2011 1028   GLUCOSE 205* 10/29/2011 1028   BUN 11 10/29/2011 1028   CREATININE 0.8 10/29/2011 1028   CALCIUM 9.2 10/29/2011 1028   GFRNONAA 104.79 10/16/2009 1518   GFRAA 128 08/21/2008 0932     Hepatic Function Panel     Component Value Date/Time   PROT 7.2 10/29/2011 1028   ALBUMIN 4.1 10/29/2011 1028   AST 36 10/29/2011 1028   ALT 44 10/29/2011 1028   ALKPHOS 99 10/29/2011 1028   BILITOT 0.3 10/29/2011 1028   BILIDIR 0.0 10/29/2011 1028     CBC    Component Value Date/Time   WBC 5.4 10/29/2011 1028   RBC 4.89 10/29/2011 1028   HGB 15.7 10/29/2011 1028   HCT 46.1 10/29/2011 1028   PLT 146.0* 10/29/2011 1028   MCV 94.4 10/29/2011 1028   MCHC 34.0 10/29/2011 1028   RDW 13.0 10/29/2011 1028   LYMPHSABS 1.2 10/29/2011 1028   MONOABS 0.8 10/29/2011 1028   EOSABS 0.1 10/29/2011 1028   BASOSABS 0.0 10/29/2011 1028     BNP No results found for this basename: probnp    Lipid Panel     Component Value Date/Time   CHOL 188 10/29/2011 1028   TRIG 211.0* 10/29/2011 1028   HDL 39.80 10/29/2011 1028   CHOLHDL 5 10/29/2011 1028   VLDL 42.2* 10/29/2011 1028     RADIOLOGY: No results  found.    ASSESSMENT AND PLAN: Mr. Diamond Nickel is now 12 years status post CBG surgery done by Dr. [name]. His last stress test was in March 2013 which continued to show normal perfusion and function without scar or ischemia. He is fasting today. We'll check a complete set of laboratory in a fasting state including NMR profile, CBC, CMP, thyroid function studies, as well as hemoglobin A1c. His blood pressure today is well controlled on current therapy. Adjustments will be made to his medical regimen depending upon laboratory. We discussed continued weight loss and exercise and proper diet. I will see him in one year for followup evaluation or sooner if problems arise.     Lennette Bihari, MD, [facility]  05/24/2013 10:38 AM

## 2013-05-24 NOTE — Patient Instructions (Addendum)
Your physician recommends that you return for lab work Fasting.  Your physician recommends that you schedule a follow-up appointment in: 1 YEAR

## 2013-05-25 LAB — NMR LIPOPROFILE WITH LIPIDS
Cholesterol, Total: 173 mg/dL (ref <200)
HDL Particle Number: 29.7 umol/L — ABNORMAL LOW (ref >=30.5)
LDL Particle Number: 1706 nmol/L — ABNORMAL HIGH (ref <1000)
Large HDL-P: 2.4 umol/L — ABNORMAL LOW (ref >=4.8)
Large VLDL-P: 7.9 nmol/L — ABNORMAL HIGH (ref <=2.7)
Small LDL Particle Number: 1008 nmol/L — ABNORMAL HIGH (ref <=527)
Triglycerides: 185 mg/dL — ABNORMAL HIGH (ref <150)

## 2013-06-05 ENCOUNTER — Ambulatory Visit (INDEPENDENT_AMBULATORY_CARE_PROVIDER_SITE_OTHER): Payer: Medicare Other | Admitting: Family Medicine

## 2013-06-05 ENCOUNTER — Encounter: Payer: Self-pay | Admitting: Family Medicine

## 2013-06-05 VITALS — BP 140/80 | HR 66 | Temp 98.0°F | Ht 69.5 in | Wt 211.0 lb

## 2013-06-05 DIAGNOSIS — N401 Enlarged prostate with lower urinary tract symptoms: Secondary | ICD-10-CM

## 2013-06-05 DIAGNOSIS — I10 Essential (primary) hypertension: Secondary | ICD-10-CM

## 2013-06-05 DIAGNOSIS — E119 Type 2 diabetes mellitus without complications: Secondary | ICD-10-CM

## 2013-06-05 DIAGNOSIS — I2510 Atherosclerotic heart disease of native coronary artery without angina pectoris: Secondary | ICD-10-CM

## 2013-06-05 DIAGNOSIS — Z23 Encounter for immunization: Secondary | ICD-10-CM

## 2013-06-05 DIAGNOSIS — N139 Obstructive and reflux uropathy, unspecified: Secondary | ICD-10-CM | POA: Diagnosis not present

## 2013-06-05 DIAGNOSIS — E785 Hyperlipidemia, unspecified: Secondary | ICD-10-CM | POA: Diagnosis not present

## 2013-06-05 DIAGNOSIS — N138 Other obstructive and reflux uropathy: Secondary | ICD-10-CM

## 2013-06-05 NOTE — Addendum Note (Signed)
Addended by: Aniceto Boss A on: 06/05/2013 09:58 AM   Modules accepted: Orders

## 2013-06-05 NOTE — Progress Notes (Signed)
   Subjective:    Patient ID: [name], male    DOB: 07-Aug-1950, 62 y.o.   MRN: [id number]  HPI 62 yr old male for a cpx. He feels well in general. He recently saw Dr. Tresa Endo and had a good report on his cardiac status. He had labs which were unremarkable including an A1c of 6.5.    Review of Systems  Constitutional: Negative.   HENT: Negative.   Eyes: Negative.   Respiratory: Negative.   Cardiovascular: Negative.   Gastrointestinal: Negative.   Genitourinary: Negative.   Musculoskeletal: Negative.   Skin: Negative.   Neurological: Negative.   Psychiatric/Behavioral: Negative.        Objective:   Physical Exam  Constitutional: He is oriented to person, place, and time. He appears well-developed and well-nourished. No distress.  HENT:  Head: Normocephalic and atraumatic.  Right Ear: External ear normal.  Left Ear: External ear normal.  Nose: Nose normal.  Mouth/Throat: Oropharynx is clear and moist. No oropharyngeal exudate.  Eyes: Conjunctivae and EOM are normal. Pupils are equal, round, and reactive to light. Right eye exhibits no discharge. Left eye exhibits no discharge. No scleral icterus.  Neck: Neck supple. No JVD present. No tracheal deviation present. No thyromegaly present.  Cardiovascular: Normal rate, regular rhythm, normal heart sounds and intact distal pulses.  Exam reveals no gallop and no friction rub.   No murmur heard. Pulmonary/Chest: Effort normal and breath sounds normal. No respiratory distress. He has no wheezes. He has no rales. He exhibits no tenderness.  Abdominal: Soft. Bowel sounds are normal. He exhibits no distension and no mass. There is no tenderness. There is no rebound and no guarding.  Genitourinary: Rectum normal, prostate normal and penis normal. Guaiac negative stool. No penile tenderness.  Musculoskeletal: Normal range of motion. He exhibits no edema and no tenderness.  Lymphadenopathy:    He has no cervical adenopathy.    Neurological: He is alert and oriented to person, place, and time. He has normal reflexes. No cranial nerve deficit. He exhibits normal muscle tone. Coordination normal.  Skin: Skin is warm and dry. No rash noted. He is not diaphoretic. No erythema. No pallor.  Psychiatric: He has a normal mood and affect. His behavior is normal. Judgment and thought content normal.          Assessment & Plan:  Well exam. Get a PSA.

## 2013-06-05 NOTE — Progress Notes (Signed)
Pre visit review using our clinic review tool, if applicable. No additional management support is needed unless otherwise documented below in the visit note. 

## 2013-06-07 DIAGNOSIS — S61209A Unspecified open wound of unspecified finger without damage to nail, initial encounter: Secondary | ICD-10-CM | POA: Diagnosis not present

## 2013-06-15 ENCOUNTER — Encounter: Payer: Medicare Other | Admitting: Family Medicine

## 2013-07-04 ENCOUNTER — Other Ambulatory Visit: Payer: Self-pay | Admitting: Family Medicine

## 2013-07-11 DIAGNOSIS — E119 Type 2 diabetes mellitus without complications: Secondary | ICD-10-CM | POA: Diagnosis not present

## 2013-07-27 ENCOUNTER — Encounter: Payer: Self-pay | Admitting: Family Medicine

## 2013-07-31 NOTE — Telephone Encounter (Signed)
I left message for pt to pick up samples.

## 2013-08-30 ENCOUNTER — Other Ambulatory Visit: Payer: Self-pay | Admitting: Family Medicine

## 2013-08-30 NOTE — Telephone Encounter (Signed)
Call in #240 with no refills. He needs testing and a contract

## 2013-08-30 NOTE — Telephone Encounter (Signed)
I called in script and spoke with pt. 

## 2013-09-04 ENCOUNTER — Other Ambulatory Visit: Payer: Self-pay | Admitting: Family Medicine

## 2013-09-06 NOTE — Telephone Encounter (Signed)
Call in #60 only. He needs testing and a contract

## 2013-09-08 ENCOUNTER — Other Ambulatory Visit: Payer: Self-pay | Admitting: Family Medicine

## 2013-09-16 ENCOUNTER — Other Ambulatory Visit: Payer: Self-pay | Admitting: Family Medicine

## 2013-09-20 ENCOUNTER — Telehealth: Payer: Self-pay | Admitting: Cardiovascular Disease

## 2013-09-20 MED ORDER — NEBIVOLOL HCL 10 MG PO TABS: 10.0000 mg | ORAL_TABLET | Freq: Every day | ORAL | Status: DC

## 2013-09-20 NOTE — Telephone Encounter (Signed)
Returned call and wife informed samples left at front desk.  Verbalized understanding and agreed w/ plan.    

## 2013-09-20 NOTE — Telephone Encounter (Signed)
Would like some samples offBystolic 10mg  please.

## 2013-09-27 ENCOUNTER — Telehealth: Payer: Self-pay | Admitting: Family Medicine

## 2013-09-27 DIAGNOSIS — Z79899 Other long term (current) drug therapy: Secondary | ICD-10-CM | POA: Diagnosis not present

## 2013-09-27 NOTE — Telephone Encounter (Signed)
Refill request for Tramadol, pt stated that he has taken care of the urine testing and contract.

## 2013-10-02 MED ORDER — TRAMADOL HCL 50 MG PO TABS: ORAL_TABLET | ORAL | Status: DC

## 2013-10-02 NOTE — Telephone Encounter (Signed)
Call in Tramadol #240 with 5 rf

## 2013-10-02 NOTE — Telephone Encounter (Signed)
I called in script 

## 2013-10-23 ENCOUNTER — Telehealth: Payer: Self-pay | Admitting: Family Medicine

## 2013-10-23 NOTE — Telephone Encounter (Signed)
Call in #60 with 5 rf 

## 2013-10-23 NOTE — Telephone Encounter (Signed)
Refill request for Clonazepam 1 mg take 1 po bid prn and [facility] to [facility].

## 2013-10-24 MED ORDER — CLONAZEPAM 1 MG PO TABS: ORAL_TABLET | ORAL | Status: DC

## 2013-10-24 NOTE — Telephone Encounter (Signed)
I called in script 

## 2013-11-14 ENCOUNTER — Encounter: Payer: Self-pay | Admitting: Family Medicine

## 2013-11-14 ENCOUNTER — Ambulatory Visit (INDEPENDENT_AMBULATORY_CARE_PROVIDER_SITE_OTHER): Payer: Medicare Other | Admitting: Family Medicine

## 2013-11-14 VITALS — BP 142/76 | HR 64 | Temp 98.6°F | Ht 69.5 in | Wt 216.0 lb

## 2013-11-14 DIAGNOSIS — G4700 Insomnia, unspecified: Secondary | ICD-10-CM | POA: Diagnosis not present

## 2013-11-14 MED ORDER — MIRTAZAPINE 30 MG PO TABS: 30.0000 mg | ORAL_TABLET | Freq: Every day | ORAL | Status: DC

## 2013-11-14 NOTE — Progress Notes (Signed)
   Subjective:    Patient ID: [name], male    DOB: 1950/12/08, 63 y.o.   MRN: [id number]  HPI Here with his wife to discuss chronic sleep problems and depression. He has tried temazepam, zolpidem, and clonazepam over the years, but nothing works well. Also he has been a bit depressed lately with a number of stressors in his life.    Review of Systems  Constitutional: Negative.   Psychiatric/Behavioral: Positive for sleep disturbance and dysphoric mood. Negative for suicidal ideas, hallucinations, behavioral problems, confusion, self-injury, decreased concentration and agitation. The patient is not nervous/anxious and is not hyperactive.        Objective:   Physical Exam  Constitutional: He is oriented to person, place, and time. He appears well-developed and well-nourished.  Neurological: He is alert and oriented to person, place, and time.  Psychiatric: He has a normal mood and affect. His behavior is normal. Thought content normal.          Assessment & Plan:  Try Remeron 30 mg qhs. Recheck in 3 weeks

## 2013-11-14 NOTE — Progress Notes (Signed)
Pre visit review using our clinic review tool, if applicable. No additional management support is needed unless otherwise documented below in the visit note. 

## 2013-11-20 ENCOUNTER — Telehealth: Payer: Self-pay | Admitting: Family Medicine

## 2013-11-20 NOTE — Telephone Encounter (Signed)
Pt called back to say that mirtazapine (REMERON) 30 MG tablet is not working for him

## 2013-11-21 NOTE — Telephone Encounter (Signed)
I spoke with pt  

## 2013-11-21 NOTE — Telephone Encounter (Signed)
It is too soon to tell. He needs to take this for about 2-3 weeks before it can be effective

## 2013-11-29 ENCOUNTER — Telehealth: Payer: Self-pay | Admitting: Cardiovascular Disease

## 2013-11-29 MED ORDER — NEBIVOLOL HCL 10 MG PO TABS: 10.0000 mg | ORAL_TABLET | Freq: Every day | ORAL | Status: DC

## 2013-11-29 NOTE — Telephone Encounter (Signed)
Would like some samples of Bystolic 10 mg please. °

## 2013-11-29 NOTE — Telephone Encounter (Signed)
LVM with [name] ( emergency contact) to inform her the Bystolic samples would be at the front desk.

## 2013-12-15 ENCOUNTER — Other Ambulatory Visit: Payer: Self-pay | Admitting: *Deleted

## 2013-12-15 MED ORDER — LISINOPRIL 20 MG PO TABS: ORAL_TABLET | ORAL | Status: DC

## 2013-12-15 NOTE — Telephone Encounter (Signed)
Rx refill sent to patient pharmacy   

## 2013-12-20 ENCOUNTER — Other Ambulatory Visit: Payer: Self-pay | Admitting: Family Medicine

## 2013-12-26 ENCOUNTER — Telehealth: Payer: Self-pay | Admitting: Family Medicine

## 2013-12-26 NOTE — Telephone Encounter (Signed)
Okay to schedule

## 2013-12-26 NOTE — Telephone Encounter (Signed)
Pt spouse called stating pt's "sugar" levels are off.  She talked to [facility] and they told her it was ok to wait for Dr. Clent RidgesFry to see him.  I offered another provider but wife declined.  Can I use SDA slot on Monday for pt??

## 2013-12-26 NOTE — Telephone Encounter (Signed)
FYI

## 2013-12-26 NOTE — Telephone Encounter (Signed)
done

## 2013-12-26 NOTE — Telephone Encounter (Signed)
Pt is on the schedule to see Dr. Clent RidgesFry on 01/01/14.

## 2013-12-26 NOTE — Telephone Encounter (Signed)
Patient Information:  Caller Name: [name]  Phone: [phone]  Patient: [name], [name]  Gender: Male  DOB: [birth year]  Age: [age] Years  PCP: Gershon CraneFry, [facility](Family Practice)  Office Follow Up:  Does the office need to follow up with this patient?: No  Instructions For The Office: N/A  RN Note:  Elevated blood sugars since  Jan 2015.  In May 2015, blood sugars were over 200.  Fatigued daily. Polydipsia and polyuria always present. Has been loosing weight with food changes.  FBS 179 at 10:52.  In past month, elevated post parandial blood sugar ranged from 227-299.  FBS ranging 129-179. Advised to see MD within 2 weeks per nursing judgment for chronic hyperglycemia and last HgA1c > 6 months ago.  Prefers to wait to see Dr Clent RidgesFry.  Transferred to office scheduler queue for appointment within next two weeks  Symptoms  Reason For Call & Symptoms: Emergent Call: Requesting appointment for chronic hyperglycemia for past 6 months.  Office transferred for triage since Dr Clent RidgesFry is out of the office.  Reviewed Health History In EMR: Yes  Reviewed Medications In EMR: Yes  Reviewed Allergies In EMR: Yes  Reviewed Surgeries / Procedures: Yes  Date of Onset of Symptoms: 07/06/2013  Treatments Tried: Changed bread to whole wheat, drinks water, stopped soft drinks, watching carbohydrates  Treatments Tried Worked: No  Guideline(s) Used:  Diabetes - High Blood Sugar  Disposition Per Guideline:   Home Care  Reason For Disposition Reached:   Blood glucose > 240 mg/dl (13 mmol/l)  Advice Given:  General  Definition of hyperglycemia: - Fasting blood glucose more than 140 mg/dL (7.5 mmol/l) or random blood glucose more than 200 mg/dL (11 mmol/l).  Symptoms of mild hyperglycemia: frequent urination, increased thirst, fatigue, blurred vision.  Symptoms of severe hyperglycemia: weakness, progressing to confusion and coma.  Treatment - Liquids  Drink at least one glass (8 oz or 240 ml) of water per hour for  the next 4 hours. (Reason: adequate hydration will reduce hyperglycemia).  Generally, you should try to drink 6-8 glasses of water each day.  Treatment - Diabetes Medications  : Continue taking your diabetes pills.  Measure and Record Your Blood Glucose  Every day you should measure your blood glucose before breakfast and before going to bed.  Record the results and show them to your doctor at your next office visit.  Daily Blood Glucose Goals  Pre-prandial (before meal): 70-130 mg/dL (3.2-4.43.9-7.2 mmol/l)  Post-prandial (2-3 hours after a meal): Less than 180 mg/dL (10 mmol/l)  Expected Course  Your blood sugar continues to get above 240 mg/dl (13 mmol/l).  Your blood sugar continues to be higher than your daily glucose goals (set by you and your doctor).  It has been longer than 6 months since you had an Hemoglobin A1C test.  Call Back If:  Blood glucose more than 300 mg/dL (01.016.5 mmol/l), 2 or more times in a row.  Vomiting lasting more than 4 hours or unable to drink any liquids.  Rapid breathing occurs  You become worse.  RN Overrode Recommendation:  Make Appointment  Advised to see MD within 2 weeks.

## 2013-12-26 NOTE — Telephone Encounter (Signed)
Lupita LeashDonna called about blood sugars increased.  Left voice mail x 2 to return call to office.

## 2014-01-01 ENCOUNTER — Ambulatory Visit (INDEPENDENT_AMBULATORY_CARE_PROVIDER_SITE_OTHER): Payer: Medicare Other | Admitting: Family Medicine

## 2014-01-01 ENCOUNTER — Encounter: Payer: Self-pay | Admitting: Family Medicine

## 2014-01-01 ENCOUNTER — Other Ambulatory Visit: Payer: Self-pay | Admitting: Family Medicine

## 2014-01-01 VITALS — BP 124/82 | HR 62 | Temp 98.2°F | Ht 69.5 in | Wt 211.5 lb

## 2014-01-01 DIAGNOSIS — E119 Type 2 diabetes mellitus without complications: Secondary | ICD-10-CM

## 2014-01-01 DIAGNOSIS — E785 Hyperlipidemia, unspecified: Secondary | ICD-10-CM | POA: Diagnosis not present

## 2014-01-01 DIAGNOSIS — I10 Essential (primary) hypertension: Secondary | ICD-10-CM | POA: Diagnosis not present

## 2014-01-01 LAB — HEMOGLOBIN A1C: HEMOGLOBIN A1C: 8.5 % — AB (ref 4.6–6.5)

## 2014-01-01 MED ORDER — SITAGLIPTIN PHOSPHATE 100 MG PO TABS: 100.0000 mg | ORAL_TABLET | Freq: Every day | ORAL | Status: DC

## 2014-01-01 NOTE — Progress Notes (Signed)
   Subjective:    Patient ID: [name], male    DOB: August 26, 1950, 63 y.o.   MRN: [id number]  HPI Here to follow up. His BP has been stable but his glucoses are trending in the 200s most of the time. His last A1c in November was 6.5. He feels well.    Review of Systems  Constitutional: Negative.   Respiratory: Negative.   Cardiovascular: Negative.        Objective:   Physical Exam  Constitutional: He appears well-developed and well-nourished.  Cardiovascular: Normal rate, regular rhythm, normal heart sounds and intact distal pulses.   Pulmonary/Chest: Effort normal and breath sounds normal.          Assessment & Plan:  Get another A1c today. Add Januvia 100 mg daily.

## 2014-01-01 NOTE — Progress Notes (Signed)
Pre visit review using our clinic review tool, if applicable. No additional management support is needed unless otherwise documented below in the visit note. 

## 2014-01-02 ENCOUNTER — Telehealth: Payer: Self-pay | Admitting: Family Medicine

## 2014-01-02 NOTE — Telephone Encounter (Signed)
Relevant patient education assigned to patient using Emmi. ° °

## 2014-01-29 ENCOUNTER — Telehealth: Payer: Self-pay | Admitting: Cardiovascular Disease

## 2014-01-29 NOTE — Telephone Encounter (Signed)
Spoke with donna, aware samples are at the front desk for pick up

## 2014-01-29 NOTE — Telephone Encounter (Signed)
Pt would like some samples of Bystolic 10 mg please.l

## 2014-01-31 ENCOUNTER — Telehealth: Payer: Self-pay | Admitting: Family Medicine

## 2014-01-31 MED ORDER — GLYBURIDE 5 MG PO TABS: ORAL_TABLET | ORAL | Status: DC

## 2014-01-31 NOTE — Telephone Encounter (Signed)
[id number]- [place], [place] - [address] is requesting re-fill on glyBURIDE (DIABETA) 5 MG tablet

## 2014-01-31 NOTE — Telephone Encounter (Signed)
I sent script e-scribe. 

## 2014-02-05 ENCOUNTER — Other Ambulatory Visit: Payer: Self-pay | Admitting: Family Medicine

## 2014-02-06 ENCOUNTER — Ambulatory Visit (INDEPENDENT_AMBULATORY_CARE_PROVIDER_SITE_OTHER): Payer: Medicare Other | Admitting: Family Medicine

## 2014-02-06 ENCOUNTER — Encounter: Payer: Self-pay | Admitting: Family Medicine

## 2014-02-06 ENCOUNTER — Telehealth: Payer: Self-pay | Admitting: Family Medicine

## 2014-02-06 VITALS — BP 145/76 | HR 67 | Temp 99.3°F | Ht 69.5 in | Wt 216.0 lb

## 2014-02-06 DIAGNOSIS — J019 Acute sinusitis, unspecified: Secondary | ICD-10-CM | POA: Diagnosis not present

## 2014-02-06 DIAGNOSIS — S30860A Insect bite (nonvenomous) of lower back and pelvis, initial encounter: Secondary | ICD-10-CM | POA: Diagnosis not present

## 2014-02-06 DIAGNOSIS — W57XXXA Bitten or stung by nonvenomous insect and other nonvenomous arthropods, initial encounter: Principal | ICD-10-CM

## 2014-02-06 MED ORDER — HYDROCODONE-HOMATROPINE 5-1.5 MG/5ML PO SYRP: 5.0000 mL | ORAL_SOLUTION | ORAL | Status: DC | PRN

## 2014-02-06 MED ORDER — DOXYCYCLINE HYCLATE 50 MG PO CAPS: 50.0000 mg | ORAL_CAPSULE | Freq: Two times a day (BID) | ORAL | Status: DC

## 2014-02-06 NOTE — Telephone Encounter (Signed)
Noted  

## 2014-02-06 NOTE — Progress Notes (Signed)
   Subjective:    Patient ID: Ala Bentenford H Moll, male    DOB: 11/19/1950, 63 y.o.   MRN: [id number]  HPI Here for several things. First he has been coughing up yellow sputum for the past week. No fever. Also 10 days ago he pulled a tick off his back and the site has been tender and red since then.    Review of Systems  Constitutional: Negative.   HENT: Negative.   Eyes: Negative.   Respiratory: Positive for cough. Negative for shortness of breath.   Skin: Positive for wound.       Objective:   Physical Exam  Constitutional: He appears well-developed and well-nourished.  HENT:  Right Ear: External ear normal.  Left Ear: External ear normal.  Nose: Nose normal.  Mouth/Throat: Oropharynx is clear and moist.  Eyes: Conjunctivae are normal.  Pulmonary/Chest: Effort normal and breath sounds normal.  Lymphadenopathy:    He has no cervical adenopathy.  Skin:  There is a red papule on the right lower back           Assessment & Plan:  We will treat the sinusitis with Doxycycline, and this would also cover any possible infection he may get from the tick bite.

## 2014-02-06 NOTE — Progress Notes (Signed)
Pre visit review using our clinic review tool, if applicable. No additional management support is needed unless otherwise documented below in the visit note. 

## 2014-02-06 NOTE — Telephone Encounter (Signed)
Patient Information:  Caller Name: Lupita LeashDonna  Phone: [phone]  Patient: Ala BentLambert, Delwin H  Gender: Male  DOB: 03/17/1951  Age: [age] Years  PCP: Gershon CraneFry, [facility](Family Practice)  Office Follow Up:  Does the office need to follow up with this patient?: No  Instructions For The Office: N/A   Symptoms  Reason For Call & Symptoms: Spouse reports tick removed from right side of body on 01/22/14; the area is still red between size of pencil eraser or dime.  He has been feeling poorly since 02/04/14.  Additionally he has chest congestion and flu like symprtoms.  Emergent symptoms ruled out.  Go to Office Now per Tick Bite guideline due to Fever or severe headache occurs, 2 to 14 days following the bite.  Reviewed Health History In EMR: Yes  Reviewed Medications In EMR: Yes  Reviewed Allergies In EMR: Yes  Reviewed Surgeries / Procedures: Yes  Date of Onset of Symptoms: 01/22/2014  Treatments Tried: Cleaned area with alcohol; Mucinex for cough. Allegra for allergies.  Treatments Tried Worked: No  Any Fever: Yes  Fever Taken: Tactile  Fever Time Of Reading: 10:25:34  Fever Last Reading: N/A  Guideline(s) Used:  Tick Bite  Disposition Per Guideline:   Go to Office Now  Reason For Disposition Reached:   Fever or severe headache occurs, 2 to 14 days following the bite  Advice Given:  Expected Course:  Tick bites normally do not itch or hurt. That is why they often go unnoticed.  Call Back If:  Bite begins to look infected  You become worse.  Patient Will Follow Care Advice:  YES  Appointment Scheduled:  02/06/2014 11:30:00 Appointment Scheduled Provider:  Gershon CraneFry, [facility](Family Practice)

## 2014-04-03 ENCOUNTER — Other Ambulatory Visit: Payer: Self-pay | Admitting: Family Medicine

## 2014-04-03 NOTE — Telephone Encounter (Signed)
Call in #240 with 5 rf 

## 2014-04-15 ENCOUNTER — Other Ambulatory Visit: Payer: Self-pay | Admitting: Family Medicine

## 2014-04-18 ENCOUNTER — Telehealth: Payer: Self-pay | Admitting: Cardiovascular Disease

## 2014-04-18 MED ORDER — NEBIVOLOL HCL 10 MG PO TABS: 10.0000 mg | ORAL_TABLET | Freq: Every day | ORAL | Status: DC

## 2014-04-18 NOTE — Telephone Encounter (Signed)
Informed wife  Samples are available - 2 weeks She states she will pick up the samples today

## 2014-04-18 NOTE — Telephone Encounter (Signed)
Lupita LeashDonna called wanting to know if Mr. Lalla BrothersLambert can get some samples of Bystolic 10mg . Please call  Thanks

## 2014-04-25 ENCOUNTER — Encounter: Payer: Self-pay | Admitting: Family Medicine

## 2014-04-25 ENCOUNTER — Ambulatory Visit (INDEPENDENT_AMBULATORY_CARE_PROVIDER_SITE_OTHER)
Admission: RE | Admit: 2014-04-25 | Discharge: 2014-04-25 | Disposition: A | Discharge status: Home / Self Care | Payer: Medicare Other | Source: Ambulatory Visit | Attending: Family Medicine | Admitting: Family Medicine

## 2014-04-25 ENCOUNTER — Ambulatory Visit (INDEPENDENT_AMBULATORY_CARE_PROVIDER_SITE_OTHER): Payer: Medicare Other | Admitting: Family Medicine

## 2014-04-25 VITALS — BP 139/74 | HR 68 | Temp 98.2°F | Ht 69.5 in | Wt 214.0 lb

## 2014-04-25 DIAGNOSIS — M1611 Unilateral primary osteoarthritis, right hip: Secondary | ICD-10-CM | POA: Diagnosis not present

## 2014-04-25 DIAGNOSIS — M25559 Pain in unspecified hip: Secondary | ICD-10-CM

## 2014-04-25 DIAGNOSIS — Z23 Encounter for immunization: Secondary | ICD-10-CM

## 2014-04-25 MED ORDER — SULFAMETHOXAZOLE-TMP DS 800-160 MG PO TABS: 1.0000 | ORAL_TABLET | Freq: Two times a day (BID) | ORAL | Status: DC

## 2014-04-25 MED ORDER — HYDROCODONE-ACETAMINOPHEN 10-325 MG PO TABS: 1.0000 | ORAL_TABLET | Freq: Four times a day (QID) | ORAL | Status: DC | PRN

## 2014-04-25 NOTE — Progress Notes (Signed)
Pre visit review using our clinic review tool, if applicable. No additional management support is needed unless otherwise documented below in the visit note. 

## 2014-04-25 NOTE — Progress Notes (Signed)
   Subjective:    Patient ID: [name], male    DOB: 1951/03/16, 63 y.o.   MRN: [id number]  HPI Here to discuss joint pains, especially the hips and knees. He has stiffness and pain almost every day. He takes Tramadol but this no longer helps much. The worst pain is in the right hip. He also asks for more Bactrim to use for prn urinary issues.    Review of Systems  Constitutional: Negative.   Genitourinary: Negative.   Musculoskeletal: Positive for arthralgias.       Objective:   Physical Exam  Constitutional: He appears well-developed and well-nourished.  Musculoskeletal: Normal range of motion. He exhibits no edema and no tenderness.          Assessment & Plan:  Given Norco to use for pain. Sent for hip Xrays

## 2014-05-28 ENCOUNTER — Encounter: Payer: Self-pay | Admitting: Cardiovascular Disease

## 2014-05-28 ENCOUNTER — Ambulatory Visit (INDEPENDENT_AMBULATORY_CARE_PROVIDER_SITE_OTHER): Payer: Medicare Other | Admitting: Cardiovascular Disease

## 2014-05-28 VITALS — BP 140/76 | HR 65 | Ht 69.0 in | Wt 216.0 lb

## 2014-05-28 DIAGNOSIS — E782 Mixed hyperlipidemia: Secondary | ICD-10-CM | POA: Diagnosis not present

## 2014-05-28 DIAGNOSIS — E119 Type 2 diabetes mellitus without complications: Secondary | ICD-10-CM | POA: Diagnosis not present

## 2014-05-28 DIAGNOSIS — I2510 Atherosclerotic heart disease of native coronary artery without angina pectoris: Secondary | ICD-10-CM | POA: Diagnosis not present

## 2014-05-28 DIAGNOSIS — I10 Essential (primary) hypertension: Secondary | ICD-10-CM | POA: Diagnosis not present

## 2014-05-28 DIAGNOSIS — I25118 Atherosclerotic heart disease of native coronary artery with other forms of angina pectoris: Secondary | ICD-10-CM | POA: Diagnosis not present

## 2014-05-28 DIAGNOSIS — Z79899 Other long term (current) drug therapy: Secondary | ICD-10-CM

## 2014-05-28 DIAGNOSIS — R5383 Other fatigue: Secondary | ICD-10-CM | POA: Diagnosis not present

## 2014-05-28 DIAGNOSIS — E7849 Other hyperlipidemia: Secondary | ICD-10-CM

## 2014-05-28 DIAGNOSIS — E784 Other hyperlipidemia: Secondary | ICD-10-CM

## 2014-05-28 DIAGNOSIS — E669 Obesity, unspecified: Secondary | ICD-10-CM

## 2014-05-28 DIAGNOSIS — E785 Hyperlipidemia, unspecified: Secondary | ICD-10-CM | POA: Diagnosis not present

## 2014-05-28 LAB — LIPID PANEL
CHOL/HDL RATIO: 3.5 ratio
Cholesterol: 126 mg/dL (ref 0–200)
HDL: 36 mg/dL — ABNORMAL LOW (ref >39)
LDL Cholesterol: 54 mg/dL (ref 0–99)
TRIGLYCERIDES: 180 mg/dL — AB (ref <150)
VLDL: 36 mg/dL (ref 0–40)

## 2014-05-28 LAB — COMPREHENSIVE METABOLIC PANEL
ALBUMIN: 4.1 g/dL (ref 3.5–5.2)
ALK PHOS: 83 U/L (ref 39–117)
ALT: 36 U/L (ref 0–53)
AST: 29 U/L (ref 0–37)
BUN: 9 mg/dL (ref 6–23)
CALCIUM: 9.5 mg/dL (ref 8.4–10.5)
CHLORIDE: 104 meq/L (ref 96–112)
CO2: 27 mEq/L (ref 19–32)
Creat: 0.82 mg/dL (ref 0.50–1.35)
GLUCOSE: 109 mg/dL — AB (ref 70–99)
POTASSIUM: 4.2 meq/L (ref 3.5–5.3)
Sodium: 141 mEq/L (ref 135–145)
Total Bilirubin: 0.4 mg/dL (ref 0.2–1.2)
Total Protein: 7 g/dL (ref 6.0–8.3)

## 2014-05-28 LAB — HEMOGLOBIN A1C
Hgb A1c MFr Bld: 6.6 % — ABNORMAL HIGH (ref <5.7)
Mean Plasma Glucose: 143 mg/dL — ABNORMAL HIGH (ref <117)

## 2014-05-28 LAB — CBC
HCT: 43.1 % (ref 39.0–52.0)
Hemoglobin: 14.8 g/dL (ref 13.0–17.0)
MCH: 31.4 pg (ref 26.0–34.0)
MCHC: 34.3 g/dL (ref 30.0–36.0)
MCV: 91.3 fL (ref 78.0–100.0)
MPV: 10.8 fL (ref 9.4–12.4)
PLATELETS: 182 10*3/uL (ref 150–400)
RBC: 4.72 MIL/uL (ref 4.22–5.81)
RDW: 13.3 % (ref 11.5–15.5)
WBC: 8.3 10*3/uL (ref 4.0–10.5)

## 2014-05-28 LAB — TSH: TSH: 2.337 u[IU]/mL (ref 0.350–4.500)

## 2014-05-28 MED ORDER — ISOSORBIDE MONONITRATE ER 30 MG PO TB24: 30.0000 mg | ORAL_TABLET | Freq: Every day | ORAL | Status: DC

## 2014-05-28 MED ORDER — NEBIVOLOL HCL 10 MG PO TABS: 10.0000 mg | ORAL_TABLET | Freq: Every day | ORAL | Status: DC

## 2014-05-28 NOTE — Progress Notes (Signed)
Patient ID: Ala BentRenford H Scarbro, male   DOB: 09/28/1950, 63 y.o.   MRN: [id number]     HPI: Alfonza Joyice FasterH Friede is a 63 y.o. male presents to the office today for one-year cardiology evaluation.   Mr. Girard CooterRedford Faux has established coronary artery disease in June 2002 underwent CABG surgery by Dr. Dorris FetchHendrickson of the sequential LIMA graft to the second diagonal and LAD, and sequential radial graft to the first and third diagonal branches. His last echo Doppler study  was in 2010 and showed normal systolic function with mild MR and trace TR.  His last nuclear perfusion study was done in March 2013 which continued to show normal perfusion and function without scar or ischemia.  Additional problems include hypertension, type 2 diabetes mellitus, and hyperlipidemia. Remotely, he had worked as a Haematologistcoal minor and in the past was some question of possible black lung. 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  Over the past several weeks, he has noticed some slight change in symptomatology.  Specifically, he notes some exertional shortness of breath and some arm weakness.  Prior bypass surgery, he never had experienced chest pain but demonstrated exertional shortness of breath and arm discomfort.  He currently admits to fatigue.  He denies any major episodes of chest pressure.  He has a history of hyperlipidemia and self increased his simvastatin from 40 mg to 80 mg.  He does note occasional myalgias.  He has been on Bystolic 10 mg and lisinopril 30 mg for his hypertension.  He is on metformin 1000 g twice a day as well as DiaBeta 5 mg for his type 2 diabetes mellitus.  He presents for evaluation.  Past Medical History  Diagnosis Date  . Hyperlipidemia   . Diabetes mellitus   . Insomnia   . Chronic headache   . Chronic neck pain   . Hypertension 12/26/08    ECHO- EF>55%  . CAD (coronary artery disease)     nuclear stress test-09/09/11 low risk scan  EF61%    Past Surgical History  Procedure Laterality Date  . Appendectomy    . Coronary artery bypass graft  2002    4 vessels  . Cervical spine injection    . Colonoscopy  04-02-06     per [facility], clear, repeat in 10 yrs    Allergies  Allergen Reactions  . Ciprofloxacin     REACTION: rash  . Clonazepam     Ringing in the ears   . Codeine   . Crestor [Rosuvastatin]   . Lipitor [Atorvastatin]   . Zolpidem Tartrate     REACTION: HA's    Current Outpatient Prescriptions  Medication Sig Dispense Refill  . ACCU-CHEK AVIVA PLUS test strip TEST SUGAR ONCE A DAY 100 each 2  . aspirin 81 MG tablet Take 81 mg by mouth daily.     . cholecalciferol (VITAMIN D) 1000 UNITS tablet Take 1,000 Units by mouth 2 (two) times daily.      . clonazePAM (KLONOPIN) 1 MG tablet Take 1 mg by mouth as needed.  5  . fexofenadine (ALLEGRA) 180 MG tablet Take 180 mg by mouth daily.    . fish oil-omega-3 fatty acids 1000 MG capsule Take 2 g by mouth daily.      . fluticasone (FLONASE) 50 MCG/ACT nasal spray PLACE 2 SPRAYS INTO THE NOSE DAILY. 16 g 11  . gabapentin (NEURONTIN) 300 MG capsule TAKE 1 CAPSULE BY MOUTH TWICE  A DAY 60 capsule 3  . glyBURIDE (DIABETA) 5 MG tablet TAKE 1 TABLET BY MOUTH TWICE A DAY 60 tablet 12  . HYDROcodone-acetaminophen (NORCO) 10-325 MG per tablet Take 1 tablet by mouth every 6 (six) hours as needed for severe pain. 120 tablet 0  . lisinopril (PRINIVIL,ZESTRIL) 20 MG tablet 1 1/2 tablets daily 135 tablet 3  . metFORMIN (GLUCOPHAGE) 1000 MG tablet TAKE 1 TABLET BY MOUTH TWICE A DAY 60 tablet 11  . Multiple Vitamin (MULTIVITAMIN) tablet Take 1 tablet by mouth daily.      . nebivolol (BYSTOLIC) 10 MG tablet Take 1 tablet (10 mg total) by mouth daily. 14 tablet 0  . simvastatin (ZOCOR) 80 MG tablet Take 80 mg by mouth daily.    . sitaGLIPtin (JANUVIA) 100 MG tablet Take 1 tablet (100 mg total) by mouth daily. 30 tablet 11  . sulfamethoxazole-trimethoprim (BACTRIM DS) 800-160  MG per tablet Take 1 tablet by mouth 2 (two) times daily. 60 tablet 5  . traMADol (ULTRAM) 50 MG tablet TAKE 2 TABLETS EVERY 6 HOURS AS NEEDED 240 tablet 5   No current facility-administered medications for this visit.    History   Social History  . Marital Status: Married    Spouse Name: N/A    Number of Children: N/A  . Years of Education: N/A   Occupational History  . Not on file.   Social History Main Topics  . Smoking status: Former Smoker    Quit date: 11/26/1970  . Smokeless tobacco: Never Used  . Alcohol Use: No  . Drug Use: No  . Sexual Activity: Not on file   Other Topics Concern  . Not on file   Social History Narrative    Family History  Problem Relation Age of Onset  . Coronary artery disease    . Hypertension    . Heart disease Mother   . Liver cancer Father   . Cancer Brother   . Cancer Brother   . Cancer Brother    Socially he is married. He has 3 children 8 grandchildren and 4 great-grandchildren. There is no tobacco or alcohol use. He is now retired.   ROS General: Negative; No fevers, chills, or night sweats;  HEENT: Decrease in hearing ,No changes in vision  sinus congestion, difficulty swallowing Pulmonary: Negative; No cough, wheezing, shortness of breath, hemoptysis Cardiovascular: See history of present illness GI: Negative; No nausea, vomiting, diarrhea, or abdominal pain GU: Negative; No dysuria, hematuria, or difficulty voiding Musculoskeletal: Negative; no myalgias, joint pain, or weakness Hematologic/Oncology: Negative; no easy bruising, bleeding Endocrine: Negative; no heat/cold intolerance; no diabetes Neuro: Negative; no changes in balance, headaches Skin: Negative; No rashes or skin lesions Psychiatric: Negative; No behavioral problems, depression Sleep: Negative; No snoring, daytime sleepiness, hypersomnolence, bruxism, restless legs, hypnogognic hallucinations, no cataplexy Other comprehensive 14 point system review is  negative.   PE BP 140/76 mmHg  Pulse 65  Ht 5\' 9"  (1.753 m)  Wt 216 lb (97.977 kg)  BMI 31.88 kg/m2  General: Alert, oriented, no distress.  Skin: normal turgor, no rashes HEENT: Normocephalic, atraumatic. Pupils round and reactive; sclera anicteric;no lid lag.  Nose without nasal septal hypertrophy Mouth/Parynx benign; Mallinpatti scale 3 Neck: No JVD, no carotid bruits normal carotid upstroke Lungs: clear to ausculatation and percussion; no wheezing or rales Chest wall: Nontender to palpation Heart: RRR, s1 s2 normal 1-2/ 6 systolic murmur; no diastolic murmur.  No rubs thrills or heaves. Abdomen: Mild diastases recti soft, nontender; no  hepatosplenomehaly, BS+; abdominal aorta nontender and not dilated by palpation. Back:  No CVA tenderness Pulses 2+ Extremities: no clubbing cyanosis or edema, Homan's sign negative;  Neurologic: grossly nonfocal Psychologic: normal affect and mood.  ECG (independently read by me): Normal sinus rhythm at 65 bpm.  Early transition.  No significant ST segment changes.  Normal intervals.  Prior ECG: Normal sinus rhythm at 58 beats per minute. QTc interval 428 ms. PR interval 170 ms. Nonspecific ST changes  LABS:  BMET    Component Value Date/Time   NA 142 05/24/2013 1049   K 4.6 05/24/2013 1049   CL 101 05/24/2013 1049   CO2 30 05/24/2013 1049   GLUCOSE 120* 05/24/2013 1049   BUN 8 05/24/2013 1049   CREATININE 0.91 05/24/2013 1049   CREATININE 0.8 10/29/2011 1028   CALCIUM 10.0 05/24/2013 1049   GFRNONAA 104.79 10/16/2009 1518   GFRAA 128 08/21/2008 0932     Hepatic Function Panel     Component Value Date/Time   PROT 7.3 05/24/2013 1049   ALBUMIN 4.6 05/24/2013 1049   AST 31 05/24/2013 1049   ALT 39 05/24/2013 1049   ALKPHOS 82 05/24/2013 1049   BILITOT 0.4 05/24/2013 1049   BILIDIR 0.0 10/29/2011 1028     CBC    Component Value Date/Time   WBC 7.4 05/24/2013 1049   RBC 5.07 05/24/2013 1049   HGB 16.0 05/24/2013 1049    HCT 45.6 05/24/2013 1049   PLT 222 05/24/2013 1049   MCV 89.9 05/24/2013 1049   MCH 31.6 05/24/2013 1049   MCHC 35.1 05/24/2013 1049   RDW 13.8 05/24/2013 1049   LYMPHSABS 1.2 10/29/2011 1028   MONOABS 0.8 10/29/2011 1028   EOSABS 0.1 10/29/2011 1028   BASOSABS 0.0 10/29/2011 1028     BNP No results found for: PROBNP  Lipid Panel     Component Value Date/Time   CHOL 188 10/29/2011 1028   TRIG 185* 05/24/2013 1049   TRIG 211.0* 10/29/2011 1028   HDL 39.80 10/29/2011 1028   CHOLHDL 5 10/29/2011 1028   VLDL 42.2* 10/29/2011 1028   LDLCALC 97 05/24/2013 1049     RADIOLOGY: No results found.    ASSESSMENT AND PLAN: Mr. Diamond Nickel is a 63 year old white male who is now  13years status post CABG surgery  by Dr. [name]. His last stress test was in March 2013 which continued to show normal perfusion and function without scar or ischemia.  Since I saw him one year ago, he has noticed a change in symptomatology over the last several weeks.  This is been more noticeable by his wife.  He has experienced episodes of occasional significant shortness of breath with activity and she felt that he had mild smothering spells.  He denies chest pain.  He does note decreased energy.  He has noticed a vague arm discomfort.  Prior to his bypass surgery, he never had experienced chest pain.  I discussed with him strategies of further evaluation including definitive repeat cardiac catheterization versus an initial noninvasive strategy.  He would prefer the latter.  As such, I am empirically adding isosorbide mononitrate 30 mg, scheduling him for an echo Doppler study as well as a exercise Myoview scan.  A complete set of blood work will be obtained today.  I have recommended he reduce his simvastatin from 80 mg to 40 mg..  His blood pressure today is controlled on therapy.  He is mildly obese with a body mass index of 32.02 kg/m and  weight loss was recommended.  I will see him back in the office  in approximately 3-4 weeks for follow-up evaluation and further recommendations will be made at that time.  Time spent: 25 minutes   Lennette Biharihomas A. Ron Junco, MD, [facility]  05/28/2014 8:53 AM

## 2014-05-28 NOTE — Patient Instructions (Addendum)
Your physician has requested that you have an echocardiogram. Echocardiography is a painless test that uses sound waves to create images of your heart. It provides your doctor with information about the size and shape of your heart and how well your heart's chambers and valves are working. This procedure takes approximately one hour. There are no restrictions for this procedure.  Your physician has requested that you have en exercise stress myoview. For further information please visit [email or web address]. Please follow instruction sheet, as given.   Theses studies will be done in the next 2-3 weeks.  Your physician recommends that you return for lab work fasting.  Your physician has recommended you make the following change in your medication: decrease the simvastatin back to 40 mg daily. Start new prescription for isosorbide 30mg .this has already been sent to the [facility].  Your physician recommends that you schedule a follow-up appointment in: 3-4 weeks.

## 2014-06-12 ENCOUNTER — Telehealth (HOSPITAL_COMMUNITY): Payer: Self-pay

## 2014-06-12 NOTE — Telephone Encounter (Signed)
Encounter complete. 

## 2014-06-14 ENCOUNTER — Ambulatory Visit (HOSPITAL_BASED_OUTPATIENT_CLINIC_OR_DEPARTMENT_OTHER)
Admission: RE | Admit: 2014-06-14 | Discharge: 2014-06-14 | Disposition: A | Discharge status: Home / Self Care | Payer: Medicare Other | Source: Ambulatory Visit | Attending: Cardiovascular Disease | Admitting: Cardiovascular Disease

## 2014-06-14 ENCOUNTER — Ambulatory Visit (HOSPITAL_COMMUNITY)
Admission: RE | Admit: 2014-06-14 | Discharge: 2014-06-14 | Disposition: A | Discharge status: Home / Self Care | Payer: Medicare Other | Source: Ambulatory Visit | Attending: Cardiovascular Disease | Admitting: Cardiovascular Disease

## 2014-06-14 ENCOUNTER — Telehealth: Payer: Self-pay

## 2014-06-14 DIAGNOSIS — I2510 Atherosclerotic heart disease of native coronary artery without angina pectoris: Secondary | ICD-10-CM

## 2014-06-14 DIAGNOSIS — E785 Hyperlipidemia, unspecified: Secondary | ICD-10-CM | POA: Insufficient documentation

## 2014-06-14 DIAGNOSIS — I059 Rheumatic mitral valve disease, unspecified: Secondary | ICD-10-CM | POA: Diagnosis not present

## 2014-06-14 DIAGNOSIS — E7849 Other hyperlipidemia: Secondary | ICD-10-CM

## 2014-06-14 MED ORDER — TECHNETIUM TC 99M SESTAMIBI GENERIC - CARDIOLITE
30.0000 | Freq: Once | INTRAVENOUS | Status: AC | PRN
  Administered 2014-06-14: 30 via INTRAVENOUS

## 2014-06-14 MED ORDER — TECHNETIUM TC 99M SESTAMIBI GENERIC - CARDIOLITE
10.0000 | Freq: Once | INTRAVENOUS | Status: AC | PRN
  Administered 2014-06-14: 10 via INTRAVENOUS

## 2014-06-14 NOTE — Procedures (Addendum)
[facility] [address] [place] [address] [phone]  Cardiology Nuclear Med Study  [name] is a 63 y.o. male     MRN : [id number]     DOB: 22-Nov-1950  Procedure Date: 06/14/2014  Nuclear Med Background Indication for Stress Test:  Graft Patency History:  CAD;CABG X4-2002;Last NUC MPI on 09/08/2011-nonischemic;EF=61%;No prior respiratory history reported. Cardiac Risk Factors: Family History - CAD, History of Smoking, Hypertension, Lipids, NIDDM and Obesity  Symptoms:  Dizziness, DOE, Fatigue, SOB and arm pain   Nuclear Pre-Procedure Caffeine/Decaff Intake:  7:00pm NPO After: 5:00am   IV Site: R Forearm  IV 0.9% NS with Angio Cath:  22g  Chest Size (in):  46" IV Started by: Rolene Course, RN  Height: _0  (1.753 m)  Cup Size: n/a  BMI:  Body mass index is 31.88 kg/(m^2). Weight:  216 lb (97.977 kg)   Tech Comments:  n/a    Nuclear Med Study 1 or 2 day study: 1 day  Stress Test Type:  Stress  Order Authorizing Provider:  Shelva Majestic, MD   Resting Radionuclide: Technetium 79mSestamibi  Resting Radionuclide Dose: 10.8 mCi   Stress Radionuclide:  Technetium 968mestamibi  Stress Radionuclide Dose: 31.1 mCi           Stress Protocol Rest HR:64 Stress HR: 141  Rest BP:149/89 Stress BP: 205/90  Exercise Time (min): 8:03 METS: 10.10   Predicted Max HR: 156 bpm % Max HR: 90.38 bpm Rate Pressure Product: 28905  Dose of Adenosine (mg):  n/a Dose of Lexiscan: n/a mg  Dose of Atropine (mg): n/a Dose of Dobutamine: n/a mcg/kg/min (at max HR)  Stress Test Technologist: GwMellody MemosCCT Nuclear Technologist: RoOtho PerlCNMT   Rest Procedure:  Myocardial perfusion imaging was performed at rest 45 minutes following the intravenous administration of Technetium 9978mstamibi. Stress Procedure:  The patient performed treadmill exercise using a Bruce  Protocol for 8 minutes 3 seconds. The patient stopped  due to elevated blood pressure and shortness of breath. Patient denied any chest pain.  There were significant ST-T wave changes.  Technetium 79m19mtamibi was injected IV at peak exercise and myocardial perfusion imaging was performed after a brief delay.  Transient Ischemic Dilatation (Normal <1.22):  1.09 QGS EDV:  122 ml QGS ESV:  53 ml LV Ejection Fraction: 57%     Rest ECG: NSR with early transition  Stress ECG: 1- 2 mm horizontal ST depression with stress which became downsloping in recovery suggestive of an ischemic respone. ST changes normalized by 9 minutes in recovery  QPS Raw Data Images:  Normal; no motion artifact; normal heart/lung ratio. Stress Images:  Normal homogeneous uptake in all areas of the myocardium. Rest Images:  Normal homogeneous uptake in all areas of the myocardium. Subtraction (SDS):  Normal  Impression Exercise Capacity:  Good exercise capacity with patient exercising to a 10.1 met workload. BP Response:  Hypertensive blood pressure response to a peak of 205/109 in early recovery. Clinical Symptoms:  No chest pain; mild shortness of breath ECG Impression:  Significant ST abnormalities consistent with ischemia. Comparison with Prior Nuclear Study: No images to compare  Overall Impression:  Low risk stress nuclear study demonstrating exercise induced 1-2 mm ST depression with normal scintigraphic images.   LV Wall Motion:  NL LV Function, EF 57%; NL Wall Motion   [name], MD  06/14/2014 5:56 PM

## 2014-06-14 NOTE — Telephone Encounter (Signed)
Patient walked in office Bystolic 10 mg samples given to patient.

## 2014-06-14 NOTE — Progress Notes (Signed)
2D Echo Performed 06/14/2014    Emiliya Chretien, RCS  

## 2014-07-05 ENCOUNTER — Encounter: Payer: Self-pay | Admitting: Cardiovascular Disease

## 2014-07-05 ENCOUNTER — Ambulatory Visit (INDEPENDENT_AMBULATORY_CARE_PROVIDER_SITE_OTHER): Payer: Medicare Other | Admitting: Cardiovascular Disease

## 2014-07-05 VITALS — BP 147/79 | HR 60 | Ht 69.5 in | Wt 216.2 lb

## 2014-07-05 DIAGNOSIS — I10 Essential (primary) hypertension: Secondary | ICD-10-CM

## 2014-07-05 DIAGNOSIS — R5383 Other fatigue: Secondary | ICD-10-CM | POA: Diagnosis not present

## 2014-07-05 DIAGNOSIS — E119 Type 2 diabetes mellitus without complications: Secondary | ICD-10-CM | POA: Diagnosis not present

## 2014-07-05 DIAGNOSIS — E669 Obesity, unspecified: Secondary | ICD-10-CM | POA: Diagnosis not present

## 2014-07-05 DIAGNOSIS — I2510 Atherosclerotic heart disease of native coronary artery without angina pectoris: Secondary | ICD-10-CM | POA: Diagnosis not present

## 2014-07-05 MED ORDER — AMLODIPINE BESYLATE 5 MG PO TABS: 5.0000 mg | ORAL_TABLET | Freq: Every day | ORAL | Status: DC

## 2014-07-05 NOTE — Patient Instructions (Signed)
Start Amolidipne 5 mg take one tablet daily.  continue all other medications  Your physician wants you to follow-up in 3 month Dr Tresa EndoKELLY.  You will receive a reminder letter in the mail two months in advance. If you don't receive a letter, please call our office to schedule the follow-up appointment.

## 2014-07-06 ENCOUNTER — Encounter: Payer: Self-pay | Admitting: Cardiovascular Disease

## 2014-07-06 NOTE — Progress Notes (Signed)
Patient ID: [name], male   DOB: 02-27-1951, 64 y.o.   MRN: [id number]     HPI: [name] is a 64 y.o. male presents to the office today for follow up cardiology evaluation.   [name] has established coronary artery disease in June 2002 underwent CABG surgery by Dr. [name] of the sequential LIMA graft to the second diagonal and LAD, and sequential radial graft to the first and third diagonal branches. His last echo Doppler study  was in 2010 and showed normal systolic function with mild MR and trace TR.  His last nuclear perfusion study was done in March 2013 which continued to show normal perfusion and function without scar or ischemia.  Additional problems include hypertension, type 2 diabetes mellitus, and hyperlipidemia. Remotely, he had worked as a Haematologist minor and in the past was some question of possible black lung. 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  In November 2015.  I had seen him for 1 year evaluation and at that time, he complained of a slight change in symptomatology.  Specifically, he noted some exertional shortness of breath and some arm weakness.  Prior bypass surgery, he never had experienced chest pain but demonstrated exertional shortness of breath and arm discomfort.  He admitted to fatigue.  He denied any major episodes of chest pressure.  Since I saw him, he underwent a 2-D echo Doppler study on 06/14/2014.  This showed an ejection fraction of 55-60% with mild focal basal hypertrophy of the septum.  Rate 1 diastolic dysfunction.  There was evidence for mild mitral regurgitation and moderate left atrial dilatation.  A nuclear perfusion study was done on December 11,2015 and was interpreted as low risk.  He developed 1-2 mm of horizontal ST segment depression and had normal scintigraphic myocardial perfusion images.  Ejection fraction was 57%.  There was normal LV function without wall  motion abnormality.  He has a history of hyperlipidemia and self increased his simvastatin from 40 mg to 80 mg.  He does note occasional myalgias.  He has been on Bystolic 10 mg and lisinopril 30 mg for his hypertension.  He is on metformin 1000 g twice a day as well as DiaBeta 5 mg for his type 2 diabetes mellitus.  He presents for evaluation.  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  Past Medical History  Diagnosis Date  . Hyperlipidemia   . Diabetes mellitus   . Insomnia   . Chronic headache   . Chronic neck pain   . Hypertension 12/26/08    ECHO- EF>55%  . CAD (coronary artery disease)     nuclear stress test-09/09/11 low risk scan EF61%    Past Surgical History  Procedure Laterality Date  . Appendectomy    . Coronary artery bypass graft  2002    4 vessels  . Cervical spine injection    . Colonoscopy  04-02-06     per [facility], clear, repeat in 10 yrs    Allergies  Allergen Reactions  . Ciprofloxacin     REACTION: rash  . Clonazepam     Ringing in the ears   . Codeine   . Crestor [Rosuvastatin]   . Isosorbide Other (See Comments)    Headaches  . Lipitor [Atorvastatin]   . Zolpidem Tartrate     REACTION: HA's    Current Outpatient Prescriptions  Medication Sig  Dispense Refill  . ACCU-CHEK AVIVA PLUS test strip TEST SUGAR ONCE A DAY 100 each 2  . aspirin 81 MG tablet Take 81 mg by mouth daily.     . cholecalciferol (VITAMIN D) 1000 UNITS tablet Take 1,000 Units by mouth 2 (two) times daily.      . fexofenadine (ALLEGRA) 180 MG tablet Take 180 mg by mouth daily.    . fish oil-omega-3 fatty acids 1000 MG capsule Take 2 g by mouth daily.      . fluticasone (FLONASE) 50 MCG/ACT nasal spray PLACE 2 SPRAYS INTO THE NOSE DAILY. 16 g 11  . gabapentin (NEURONTIN) 300 MG capsule TAKE 1 CAPSULE BY MOUTH TWICE A DAY 60 capsule 3  . glyBURIDE (DIABETA) 5 MG tablet TAKE 1 TABLET BY MOUTH TWICE A DAY 60 tablet 12  .  HYDROcodone-acetaminophen (NORCO) 10-325 MG per tablet Take 1 tablet by mouth every 6 (six) hours as needed for severe pain. 120 tablet 0  . lisinopril (PRINIVIL,ZESTRIL) 20 MG tablet 1 1/2 tablets daily 135 tablet 3  . metFORMIN (GLUCOPHAGE) 1000 MG tablet TAKE 1 TABLET BY MOUTH TWICE A DAY 60 tablet 11  . Multiple Vitamin (MULTIVITAMIN) tablet Take 1 tablet by mouth daily.      . nebivolol (BYSTOLIC) 10 MG tablet Take 1 tablet (10 mg total) by mouth daily. 30 tablet 0  . simvastatin (ZOCOR) 40 MG tablet Take 40 mg by mouth daily.    . sitaGLIPtin (JANUVIA) 100 MG tablet Take 1 tablet (100 mg total) by mouth daily. 30 tablet 11  . sulfamethoxazole-trimethoprim (BACTRIM DS) 800-160 MG per tablet Take 1 tablet by mouth 2 (two) times daily. 60 tablet 5  . traMADol (ULTRAM) 50 MG tablet TAKE 2 TABLETS EVERY 6 HOURS AS NEEDED 240 tablet 5  . amLODipine (NORVASC) 5 MG tablet Take 1 tablet (5 mg total) by mouth daily. 90 tablet 3   No current facility-administered medications for this visit.    History   Social History  . Marital Status: Married    Spouse Name: N/A    Number of Children: N/A  . Years of Education: N/A   Occupational History  . Not on file.   Social History Main Topics  . Smoking status: Former Smoker    Quit date: 11/26/1970  . Smokeless tobacco: Never Used  . Alcohol Use: No  . Drug Use: No  . Sexual Activity: Not on file   Other Topics Concern  . Not on file   Social History Narrative    Family History  Problem Relation Age of Onset  . Coronary artery disease    . Hypertension    . Heart disease Mother   . Liver cancer Father   . Cancer Brother   . Cancer Brother   . Cancer Brother    Socially he is married. He has 3 children 8 grandchildren and 4 great-grandchildren. There is no tobacco or alcohol use. He is now retired.   ROS General: Negative; No fevers, chills, or night sweats;  HEENT: Decrease in hearing ,No changes in vision  sinus congestion,  difficulty swallowing Pulmonary: Negative; No cough, wheezing, shortness of breath, hemoptysis Cardiovascular: See history of present illness GI: Negative; No nausea, vomiting, diarrhea, or abdominal pain GU: Negative; No dysuria, hematuria, or difficulty voiding Musculoskeletal: Negative; no myalgias, joint pain, or weakness Hematologic/Oncology: Negative; no easy bruising, bleeding Endocrine: Negative; no heat/cold intolerance; no diabetes Neuro: Negative; no changes in balance, headaches Skin: Negative; No rashes or skin  lesions Psychiatric: Negative; No behavioral problems, depression Sleep: Negative; No snoring, daytime sleepiness, hypersomnolence, bruxism, restless legs, hypnogognic hallucinations, no cataplexy Other comprehensive 14 point system review is negative.   PE BP 147/79 mmHg  Pulse 60  Ht 5' 9.5" (1.765 m)  Wt 216 lb 3.2 oz (98.068 kg)  BMI 31.48 kg/m2  Repeat blood pressure by me was elevated at 158/80. General: Alert, oriented, no distress.  Skin: normal turgor, no rashes HEENT: Normocephalic, atraumatic. Pupils round and reactive; sclera anicteric;no lid lag.  Nose without nasal septal hypertrophy Mouth/Parynx benign; Mallinpatti scale 3 Neck: No JVD, no carotid bruits normal carotid upstroke Lungs: clear to ausculatation and percussion; no wheezing or rales Chest wall: Nontender to palpation Heart: RRR, s1 s2 normal 1-2/ 6 systolic murmur; no diastolic murmur.  No rubs thrills or heaves. Abdomen: Mild diastases recti soft, nontender; no hepatosplenomehaly, BS+; abdominal aorta nontender and not dilated by palpation. Back:  No CVA tenderness Pulses 2+ Extremities: no clubbing cyanosis or edema, Homan's sign negative;  Neurologic: grossly nonfocal Psychologic: normal affect and mood.  November 2015 ECG (independently read by me): Normal sinus rhythm at 65 bpm.  Early transition.  No significant ST segment changes.  Normal intervals.  Prior ECG: Normal sinus  rhythm at 58 beats per minute. QTc interval 428 ms. PR interval 170 ms. Nonspecific ST changes  LABS:  BMET    Component Value Date/Time   NA 141 05/28/2014 0938   K 4.2 05/28/2014 0938   CL 104 05/28/2014 0938   CO2 27 05/28/2014 0938   GLUCOSE 109* 05/28/2014 0938   BUN 9 05/28/2014 0938   CREATININE 0.82 05/28/2014 0938   CREATININE 0.8 10/29/2011 1028   CALCIUM 9.5 05/28/2014 0938   GFRNONAA 104.79 10/16/2009 1518   GFRAA 128 08/21/2008 0932     Hepatic Function Panel     Component Value Date/Time   PROT 7.0 05/28/2014 0938   ALBUMIN 4.1 05/28/2014 0938   AST 29 05/28/2014 0938   ALT 36 05/28/2014 0938   ALKPHOS 83 05/28/2014 0938   BILITOT 0.4 05/28/2014 0938   BILIDIR 0.0 10/29/2011 1028     CBC    Component Value Date/Time   WBC 8.3 05/28/2014 0938   RBC 4.72 05/28/2014 0938   HGB 14.8 05/28/2014 0938   HCT 43.1 05/28/2014 0938   PLT 182 05/28/2014 0938   MCV 91.3 05/28/2014 0938   MCH 31.4 05/28/2014 0938   MCHC 34.3 05/28/2014 0938   RDW 13.3 05/28/2014 0938   LYMPHSABS 1.2 10/29/2011 1028   MONOABS 0.8 10/29/2011 1028   EOSABS 0.1 10/29/2011 1028   BASOSABS 0.0 10/29/2011 1028     BNP No results found for: PROBNP  Lipid Panel     Component Value Date/Time   CHOL 126 05/28/2014 0938   TRIG 180* 05/28/2014 0938   TRIG 185* 05/24/2013 1049   HDL 36* 05/28/2014 0938   CHOLHDL 3.5 05/28/2014 0938   VLDL 36 05/28/2014 0938   LDLCALC 54 05/28/2014 0938   LDLCALC 97 05/24/2013 1049     RADIOLOGY: No results found.    ASSESSMENT AND PLAN: Mr. Diamond Nickel is a 64 year old male who is 13 years status post CABG surgery  by Dr. [name].  When I had seen him one month ago, he had noticed a slight change in symptomatology with some mild shortness of breath and fatigue.  In the past.  He never experienced classic substernal chest pressure.  At that time, I attempted to add low-dose  isosorbide mononitrate, but unfortunately he developed a  significant headache and stopped taking this.  His nuclear perfusion study continues to show normal perfusion but he did develop ST segment changes of 1-2 mm.  His blood pressure today is elevated despite being on Bystolic 10 mg, lisinopril 30 mg daily.  His resting pulse is 60.  I elected to add amlodipine 5 mg to his medical regimen which would be helpful both for blood pressure control as well as for potential small area of myocardium which may not be supplied by his grafts.  His current stress study argues against significant development of graft stenosis.  I reviewed his recent laboratory which showed a total cholesterol of 126, triglycerides 180, HDL cholesterol 36 and LDL cholesterol at 54.  We discussed the importance of increased exercise and weight loss.  I will see him in 3 months for reevaluation and further recommendations will be made at that time. Time spent: 25 minutes   Lennette Bihari, MD, [facility]  07/06/2014 6:36 PM

## 2014-07-11 ENCOUNTER — Telehealth: Payer: Self-pay | Admitting: Family Medicine

## 2014-07-11 NOTE — Telephone Encounter (Signed)
Pt requesting refill of HYDROcodone-acetaminophen (NORCO) 10-325 MG per tablet °

## 2014-07-12 MED ORDER — HYDROCODONE-ACETAMINOPHEN 10-325 MG PO TABS: 1.0000 | ORAL_TABLET | Freq: Four times a day (QID) | ORAL | Status: DC | PRN

## 2014-07-12 NOTE — Telephone Encounter (Signed)
done

## 2014-07-12 NOTE — Telephone Encounter (Signed)
Script is ready for pick up and I left a voice message.  

## 2014-08-07 ENCOUNTER — Other Ambulatory Visit: Payer: Self-pay | Admitting: Family Medicine

## 2014-09-04 ENCOUNTER — Encounter: Payer: Self-pay | Admitting: Cardiovascular Disease

## 2014-09-04 ENCOUNTER — Ambulatory Visit (INDEPENDENT_AMBULATORY_CARE_PROVIDER_SITE_OTHER): Payer: Medicare Other | Admitting: Cardiovascular Disease

## 2014-09-04 VITALS — BP 112/74 | HR 67 | Ht 69.5 in | Wt 217.6 lb

## 2014-09-04 DIAGNOSIS — I2510 Atherosclerotic heart disease of native coronary artery without angina pectoris: Secondary | ICD-10-CM

## 2014-09-04 DIAGNOSIS — I10 Essential (primary) hypertension: Secondary | ICD-10-CM | POA: Diagnosis not present

## 2014-09-04 DIAGNOSIS — E785 Hyperlipidemia, unspecified: Secondary | ICD-10-CM | POA: Diagnosis not present

## 2014-09-04 DIAGNOSIS — R5383 Other fatigue: Secondary | ICD-10-CM

## 2014-09-04 NOTE — Patient Instructions (Signed)
Your physician wants you to follow-up in: 6 months or sooner if needed with Dr. Kelly. You will receive a reminder letter in the mail two months in advance. If you don't receive a letter, please call our office to schedule the follow-up appointment. 

## 2014-09-04 NOTE — Progress Notes (Signed)
Patient ID: [name], male   DOB: 21-May-1951, 64 y.o.   MRN: [id number] Patient ID: [name], male   DOB: 08/13/1950, 64 y.o.   MRN: [id number]     HPI: [name] is a 64 y.o. male presents to the office today for a follow up cardiology evaluation.   Mr. Kymere Fullington has established CAD and in June 2002 underwent CABG surgery by Dr. Roxan Hockey with a sequential LIMA graft to the second diagonal and LAD, and sequential radial graft to the first and third diagonal branches. An echo Doppler study in 2010 showed normal systolic function with mild MR and trace TR.  A  nuclear perfusion study in March 2013 showed normal perfusion and function without scar or ischemia.  Additional problems include hypertension, type 2 diabetes mellitus, and hyperlipidemia. Remotely, he had worked as a Garment/textile technologist minor and in the past was some question of possible black lung. 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  In November 2015 he complained of a slight change in symptomatology.  Specifically, he noted some exertional shortness of breath and some arm weakness.  Prior bypass surgery, he never had experienced chest pain but demonstrated exertional shortness of breath and arm discomfort.  He admitted to fatigue.  He denied any major episodes of chest pressure.  He underwent a 2-D echo Doppler study on 06/14/2014.  This showed an ejection fraction of 55-60% with mild focal basal hypertrophy of the septum.  Rate 1 diastolic dysfunction.  There was evidence for mild mitral regurgitation and moderate left atrial dilatation.  A nuclear perfusion study on December 11,2015 was interpreted as low risk.  He developed 1-2 mm of horizontal ST segment depression and had normal scintigraphic myocardial perfusion images.  Ejection fraction was 57%.  There was normal LV function without wall motion abnormality.  He has a history of hyperlipidemia and self  increased his simvastatin from 40 mg to 80 mg.  He does note occasional myalgias.  He has been on Bystolic 10 mg and lisinopril 30 mg for his hypertension.  He is on metformin 1000 g twice a day as well as DiaBeta 5 mg for his type 2 diabetes mellitus.  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  When I last saw him again the December, recommended he reduce his simvastatin to 40 mg.  I also added amlodipine 5 mg more optimal blood pressure control.  He has felt well with this adjustment.  He denies any chest pain.  He denies palpitations.  He stays busy.  He denies edema.  He presents for evaluation.  Past Medical History  Diagnosis Date  . Hyperlipidemia   . Diabetes mellitus   . Insomnia   . Chronic headache   . Chronic neck pain   . Hypertension 12/26/08    ECHO- EF>55%  . CAD (coronary artery disease)     nuclear stress test-09/09/11 low risk scan EF61%    Past Surgical History  Procedure Laterality Date  . Appendectomy    . Coronary artery bypass graft  2002    4 vessels  . Cervical spine injection    . Colonoscopy  04-02-06     per [facility], clear, repeat in 10 yrs    Allergies  Allergen Reactions  . Ciprofloxacin     REACTION: rash  . Clonazepam     Ringing in the ears   .  Codeine   . Crestor [Rosuvastatin]   . Isosorbide Other (See Comments)    Headaches  . Lipitor [Atorvastatin]   . Zolpidem Tartrate     REACTION: HA's    Current Outpatient Prescriptions  Medication Sig Dispense Refill  . ACCU-CHEK AVIVA PLUS test strip TEST SUGAR ONCE A DAY 100 each 2  . amLODipine (NORVASC) 5 MG tablet Take 1 tablet (5 mg total) by mouth daily. 90 tablet 3  . aspirin 81 MG tablet Take 81 mg by mouth daily.     . cholecalciferol (VITAMIN D) 1000 UNITS tablet Take 1,000 Units by mouth 2 (two) times daily.      . fexofenadine (ALLEGRA) 180 MG tablet Take 180 mg by mouth daily.    . fish oil-omega-3 fatty acids 1000 MG capsule Take 2 g  by mouth daily.      . fluticasone (FLONASE) 50 MCG/ACT nasal spray PLACE 2 SPRAYS INTO THE NOSE DAILY. 16 g 11  . gabapentin (NEURONTIN) 300 MG capsule TAKE 1 CAPSULE BY MOUTH TWICE A DAY 60 capsule 3  . glyBURIDE (DIABETA) 5 MG tablet TAKE 1 TABLET BY MOUTH TWICE A DAY 60 tablet 12  . HYDROcodone-acetaminophen (NORCO) 10-325 MG per tablet Take 1 tablet by mouth every 6 (six) hours as needed for severe pain. 120 tablet 0  . lisinopril (PRINIVIL,ZESTRIL) 20 MG tablet 1 1/2 tablets daily 135 tablet 3  . metFORMIN (GLUCOPHAGE) 1000 MG tablet TAKE 1 TABLET BY MOUTH TWICE A DAY 60 tablet 11  . Multiple Vitamin (MULTIVITAMIN) tablet Take 1 tablet by mouth daily.      . nebivolol (BYSTOLIC) 10 MG tablet Take 1 tablet (10 mg total) by mouth daily. 30 tablet 0  . simvastatin (ZOCOR) 40 MG tablet Take 40 mg by mouth daily.    . sitaGLIPtin (JANUVIA) 100 MG tablet Take 1 tablet (100 mg total) by mouth daily. 30 tablet 11  . sulfamethoxazole-trimethoprim (BACTRIM DS) 800-160 MG per tablet Take 1 tablet by mouth 2 (two) times daily. 60 tablet 5  . traMADol (ULTRAM) 50 MG tablet TAKE 2 TABLETS EVERY 6 HOURS AS NEEDED 240 tablet 5   No current facility-administered medications for this visit.    History   Social History  . Marital Status: Married    Spouse Name: N/A  . Number of Children: N/A  . Years of Education: N/A   Occupational History  . Not on file.   Social History Main Topics  . Smoking status: Former Smoker    Quit date: 11/26/1970  . Smokeless tobacco: Never Used  . Alcohol Use: No  . Drug Use: No  . Sexual Activity: Not on file   Other Topics Concern  . Not on file   Social History Narrative    Family History  Problem Relation Age of Onset  . Coronary artery disease    . Hypertension    . Heart disease Mother   . Liver cancer Father   . Cancer Brother   . Cancer Brother   . Cancer Brother    Socially he is married. He has 3 children 8 grandchildren and 4  great-grandchildren. There is no tobacco or alcohol use. He is now retired.   ROS General: Negative; No fevers, chills, or night sweats;  HEENT: Decrease in hearing ,No changes in vision  sinus congestion, difficulty swallowing Pulmonary: Negative; No cough, wheezing, shortness of breath, hemoptysis Cardiovascular: See history of present illness GI: Negative; No nausea, vomiting, diarrhea, or abdominal pain GU: Negative;  No dysuria, hematuria, or difficulty voiding Musculoskeletal: Negative; no myalgias, joint pain, or weakness Hematologic/Oncology: Negative; no easy bruising, bleeding Endocrine: Negative; no heat/cold intolerance; no diabetes Neuro: Negative; no changes in balance, headaches Skin: Negative; No rashes or skin lesions Psychiatric: Negative; No behavioral problems, depression Sleep: Negative; No snoring, daytime sleepiness, hypersomnolence, bruxism, restless legs, hypnogognic hallucinations, no cataplexy Other comprehensive 14 point system review is negative.   PE BP 112/74 mmHg  Pulse 67  Ht 5' 9.5" (1.765 m)  Wt 217 lb 9.6 oz (98.703 kg)  BMI 31.68 kg/m2  Repeat blood pressure by me was elevated at 158/80. General: Alert, oriented, no distress.  Skin: normal turgor, no rashes HEENT: Normocephalic, atraumatic. Pupils round and reactive; sclera anicteric;no lid lag.  Nose without nasal septal hypertrophy Mouth/Parynx benign; Mallinpatti scale 3 Neck: No JVD, no carotid bruits normal carotid upstroke Lungs: clear to ausculatation and percussion; no wheezing or rales Chest wall: Nontender to palpation Heart: RRR, s1 s2 normal 0-8/ 6 systolic murmur; no diastolic murmur.  No rubs thrills or heaves. Abdomen: Mild diastases recti soft, nontender; no hepatosplenomehaly, BS+; abdominal aorta nontender and not dilated by palpation. Back:  No CVA tenderness Pulses 2+ Extremities: no clubbing cyanosis or edema, Homan's sign negative;  Neurologic: grossly  nonfocal Psychologic: normal affect and mood.  ECG (independently read by me): Normal sinus rhythm at 67.  No ectopy.  November 2015 ECG (independently read by me): Normal sinus rhythm at 65 bpm.  Early transition.  No significant ST segment changes.  Normal intervals.  Prior ECG: Normal sinus rhythm at 58 beats per minute. QTc interval 428 ms. PR interval 170 ms. Nonspecific ST changes  LABS:  BMET  BMP Latest Ref Rng 05/28/2014 05/24/2013 10/29/2011  Glucose 70 - 99 mg/dL 109(H) 120(H) 205(H)  BUN 6 - 23 mg/dL 9 8 11   Creatinine 0.50 - 1.35 mg/dL 0.82 0.91 0.8  Sodium 135 - 145 mEq/L 141 142 140  Potassium 3.5 - 5.3 mEq/L 4.2 4.6 4.2  Chloride 96 - 112 mEq/L 104 101 105  CO2 19 - 32 mEq/L 27 30 25   Calcium 8.4 - 10.5 mg/dL 9.5 10.0 9.2     Hepatic Function Panel   Hepatic Function Latest Ref Rng 05/28/2014 05/24/2013 10/29/2011  Total Protein 6.0 - 8.3 g/dL 7.0 7.3 7.2  Albumin 3.5 - 5.2 g/dL 4.1 4.6 4.1  AST 0 - 37 U/L 29 31 36  ALT 0 - 53 U/L 36 39 44  Alk Phosphatase 39 - 117 U/L 83 82 99  Total Bilirubin 0.2 - 1.2 mg/dL 0.4 0.4 0.3  Bilirubin, Direct 0.0 - 0.3 mg/dL - - 0.0     CBC  CBC Latest Ref Rng 05/28/2014 05/24/2013 10/29/2011  WBC 4.0 - 10.5 K/uL 8.3 7.4 5.4  Hemoglobin 13.0 - 17.0 g/dL 14.8 16.0 15.7  Hematocrit 39.0 - 52.0 % 43.1 45.6 46.1  Platelets 150 - 400 K/uL 182 222 146.0(L)     BNP No results found for: PROBNP  Lipid Panel     Component Value Date/Time   CHOL 126 05/28/2014 0938   CHOL 173 05/24/2013 1049   TRIG 180* 05/28/2014 0938   TRIG 185* 05/24/2013 1049   HDL 36* 05/28/2014 0938   HDL 39* 05/24/2013 1049   CHOLHDL 3.5 05/28/2014 0938   VLDL 36 05/28/2014 0938   LDLCALC 54 05/28/2014 0938   LDLCALC 97 05/24/2013 1049     RADIOLOGY: No results found.    ASSESSMENT AND PLAN: Mr. Mora Appl  is a 64 year old male who is 13 years status post CABG surgery  by Dr. Roxan Hockey. He  noticed a slight change in  symptomatology  In November 2015 with some mild shortness of breath and fatigue.  In the past he never experienced classic substernal chest pressure.  At that time, I attempted to add low-dose isosorbide mononitrate, but unfortunately he developed a significant headache and stopped taking this.  His nuclear perfusion study continues to show normal perfusion but he did develop ST segment changes of 1-2 mm.  Since the addition of amlodipine to his medical regimen he has felt improved.  His blood pressure today is excellent on amlodipine 5 mg, lisinopril 30 mg, and Bystolic 10 mg.  He is tolerating Zocor 40 mg without myalgias.  He is diabetic on metformin and glyburide and fish oil omega-3 fatty acids.  He'll continue current therapy.  I will see him in 6 months for reevaluation or sooner if problems arise.  Troy Sine, MD, [facility]  09/04/2014 4:44 PM

## 2014-10-08 ENCOUNTER — Telehealth: Payer: Self-pay | Admitting: Family Medicine

## 2014-10-08 ENCOUNTER — Other Ambulatory Visit: Payer: Self-pay | Admitting: Family Medicine

## 2014-10-08 NOTE — Telephone Encounter (Signed)
Pt request refill HYDROcodone-acetaminophen (NORCO) 10-325 MG per tablet °

## 2014-10-10 NOTE — Telephone Encounter (Signed)
Call in #240 with 5 rf 

## 2014-10-12 MED ORDER — HYDROCODONE-ACETAMINOPHEN 10-325 MG PO TABS: 1.0000 | ORAL_TABLET | Freq: Four times a day (QID) | ORAL | Status: DC | PRN

## 2014-10-12 NOTE — Telephone Encounter (Signed)
Script is ready for pick up and tried to reach pt, no answer. 

## 2014-10-12 NOTE — Telephone Encounter (Signed)
done

## 2014-10-31 ENCOUNTER — Other Ambulatory Visit: Payer: Self-pay | Admitting: Family Medicine

## 2014-11-01 DIAGNOSIS — E119 Type 2 diabetes mellitus without complications: Secondary | ICD-10-CM | POA: Diagnosis not present

## 2014-11-01 LAB — HM DIABETES EYE EXAM

## 2014-11-20 ENCOUNTER — Encounter: Payer: Self-pay | Admitting: Family Medicine

## 2014-12-08 ENCOUNTER — Other Ambulatory Visit: Payer: Self-pay | Admitting: Cardiovascular Disease

## 2014-12-10 NOTE — Telephone Encounter (Signed)
Rx(s) sent to pharmacy electronically.  

## 2015-01-08 ENCOUNTER — Telehealth: Payer: Self-pay | Admitting: Family Medicine

## 2015-01-08 MED ORDER — HYDROCODONE-ACETAMINOPHEN 10-325 MG PO TABS: 1.0000 | ORAL_TABLET | Freq: Four times a day (QID) | ORAL | Status: DC | PRN

## 2015-01-08 NOTE — Telephone Encounter (Signed)
Script is ready for pick up and I left a message. 

## 2015-01-08 NOTE — Telephone Encounter (Signed)
Pt needs new rx hydrocodone °

## 2015-01-08 NOTE — Telephone Encounter (Signed)
done

## 2015-01-21 ENCOUNTER — Other Ambulatory Visit: Payer: Self-pay | Admitting: Family Medicine

## 2015-01-27 ENCOUNTER — Other Ambulatory Visit: Payer: Self-pay | Admitting: Family Medicine

## 2015-02-14 ENCOUNTER — Other Ambulatory Visit: Payer: Self-pay | Admitting: Family Medicine

## 2015-02-14 ENCOUNTER — Telehealth: Payer: Self-pay | Admitting: Cardiovascular Disease

## 2015-02-14 MED ORDER — NEBIVOLOL HCL 10 MG PO TABS: 10.0000 mg | ORAL_TABLET | Freq: Every day | ORAL | Status: DC

## 2015-02-14 NOTE — Telephone Encounter (Signed)
Patient aware samples are at the front desk for pick up  

## 2015-02-14 NOTE — Telephone Encounter (Signed)
[name] is calling to get some samples of Bystolic  . Please call . Thanks

## 2015-02-25 ENCOUNTER — Other Ambulatory Visit: Payer: Self-pay | Admitting: Family Medicine

## 2015-02-25 ENCOUNTER — Telehealth: Payer: Self-pay | Admitting: Family Medicine

## 2015-02-25 NOTE — Telephone Encounter (Signed)
Pt request refill HYDROcodone-acetaminophen (NORCO) 10-325 MG per tablet °

## 2015-02-26 MED ORDER — HYDROCODONE-ACETAMINOPHEN 10-325 MG PO TABS: 1.0000 | ORAL_TABLET | Freq: Four times a day (QID) | ORAL | Status: DC | PRN

## 2015-02-26 NOTE — Telephone Encounter (Signed)
done

## 2015-02-26 NOTE — Telephone Encounter (Signed)
Script is ready for pick up and I left a message for pt. 

## 2015-03-02 ENCOUNTER — Other Ambulatory Visit: Payer: Self-pay | Admitting: Family Medicine

## 2015-03-27 ENCOUNTER — Telehealth: Payer: Self-pay | Admitting: Family Medicine

## 2015-03-27 NOTE — Telephone Encounter (Signed)
Pt needs new rx hydrocodone °

## 2015-03-28 MED ORDER — HYDROCODONE-ACETAMINOPHEN 10-325 MG PO TABS: 1.0000 | ORAL_TABLET | Freq: Four times a day (QID) | ORAL | Status: DC | PRN

## 2015-03-28 NOTE — Telephone Encounter (Signed)
Script is ready for pick up and I left a voice message for pt. 

## 2015-03-28 NOTE — Telephone Encounter (Signed)
done

## 2015-04-01 ENCOUNTER — Ambulatory Visit (INDEPENDENT_AMBULATORY_CARE_PROVIDER_SITE_OTHER): Payer: Medicare Other | Admitting: Family Medicine

## 2015-04-01 ENCOUNTER — Encounter: Payer: Self-pay | Admitting: Family Medicine

## 2015-04-01 ENCOUNTER — Other Ambulatory Visit: Payer: Self-pay | Admitting: Family Medicine

## 2015-04-01 VITALS — BP 129/66 | HR 56 | Temp 98.0°F | Ht 69.5 in | Wt 214.0 lb

## 2015-04-01 DIAGNOSIS — Z23 Encounter for immunization: Secondary | ICD-10-CM | POA: Diagnosis not present

## 2015-04-01 DIAGNOSIS — E119 Type 2 diabetes mellitus without complications: Secondary | ICD-10-CM

## 2015-04-01 DIAGNOSIS — I2510 Atherosclerotic heart disease of native coronary artery without angina pectoris: Secondary | ICD-10-CM | POA: Diagnosis not present

## 2015-04-01 DIAGNOSIS — E785 Hyperlipidemia, unspecified: Secondary | ICD-10-CM

## 2015-04-01 LAB — CBC WITH DIFFERENTIAL/PLATELET
Basophils Absolute: 0 10*3/uL (ref 0.0–0.1)
Basophils Relative: 0.4 % (ref 0.0–3.0)
Eosinophils Absolute: 0.1 10*3/uL (ref 0.0–0.7)
Eosinophils Relative: 0.9 % (ref 0.0–5.0)
HCT: 44.2 % (ref 39.0–52.0)
Hemoglobin: 14.6 g/dL (ref 13.0–17.0)
Lymphocytes Relative: 27.1 % (ref 12.0–46.0)
Lymphs Abs: 2 10*3/uL (ref 0.7–4.0)
MCHC: 33.1 g/dL (ref 30.0–36.0)
MCV: 92.6 fl (ref 78.0–100.0)
Monocytes Absolute: 0.8 10*3/uL (ref 0.1–1.0)
Monocytes Relative: 10.6 % (ref 3.0–12.0)
Neutro Abs: 4.5 10*3/uL (ref 1.4–7.7)
Neutrophils Relative %: 61 % (ref 43.0–77.0)
Platelets: 181 10*3/uL (ref 150.0–400.0)
RBC: 4.77 Mil/uL (ref 4.22–5.81)
RDW: 13.5 % (ref 11.5–15.5)
WBC: 7.3 10*3/uL (ref 4.0–10.5)

## 2015-04-01 LAB — POCT URINALYSIS DIPSTICK
Bilirubin, UA: NEGATIVE
Blood, UA: NEGATIVE
GLUCOSE UA: NEGATIVE
Ketones, UA: NEGATIVE
Leukocytes, UA: NEGATIVE
NITRITE UA: NEGATIVE
Protein, UA: NEGATIVE
SPEC GRAV UA: 1.025
UROBILINOGEN UA: 0.2
pH, UA: 5.5

## 2015-04-01 LAB — BASIC METABOLIC PANEL
BUN: 11 mg/dL (ref 6–23)
CHLORIDE: 102 meq/L (ref 96–112)
CO2: 27 mEq/L (ref 19–32)
CREATININE: 0.8 mg/dL (ref 0.40–1.50)
Calcium: 9.4 mg/dL (ref 8.4–10.5)
GFR: 102.96 mL/min (ref >60.00)
Glucose, Bld: 143 mg/dL — ABNORMAL HIGH (ref 70–99)
POTASSIUM: 3.9 meq/L (ref 3.5–5.1)
SODIUM: 140 meq/L (ref 135–145)

## 2015-04-01 LAB — LIPID PANEL
CHOL/HDL RATIO: 5
Cholesterol: 206 mg/dL — ABNORMAL HIGH (ref 0–200)
HDL: 38.8 mg/dL — AB (ref >39.00)
LDL Cholesterol: 129 mg/dL — ABNORMAL HIGH (ref 0–99)
NONHDL: 166.99
Triglycerides: 189 mg/dL — ABNORMAL HIGH (ref 0.0–149.0)
VLDL: 37.8 mg/dL (ref 0.0–40.0)

## 2015-04-01 LAB — HEPATIC FUNCTION PANEL
ALT: 33 U/L (ref 0–53)
AST: 27 U/L (ref 0–37)
Albumin: 4.1 g/dL (ref 3.5–5.2)
Alkaline Phosphatase: 84 U/L (ref 39–117)
BILIRUBIN DIRECT: 0.1 mg/dL (ref 0.0–0.3)
BILIRUBIN TOTAL: 0.4 mg/dL (ref 0.2–1.2)
Total Protein: 6.9 g/dL (ref 6.0–8.3)

## 2015-04-01 LAB — HEMOGLOBIN A1C: Hgb A1c MFr Bld: 7 % — ABNORMAL HIGH (ref 4.6–6.5)

## 2015-04-01 LAB — TSH: TSH: 2.49 u[IU]/mL (ref 0.35–4.50)

## 2015-04-01 NOTE — Progress Notes (Signed)
   Subjective:    Patient ID: [name], male    DOB: Apr 13, 1951, 64 y.o.   MRN: [id number]  HPI Here to follow up. His glucoses are under excellent control and he sometimes drops into the 60s. He walks 5 miles every day with is wife and he has lost weight. He feels great. He sses Dr. Tresa Endo regularly and his heart is healthy.    Review of Systems  Constitutional: Negative.   Respiratory: Negative.   Cardiovascular: Negative.   Endocrine: Negative.   Neurological: Negative.        Objective:   Physical Exam  Constitutional: He appears well-developed and well-nourished.  Neck: No thyromegaly present.  Cardiovascular: Normal rate, regular rhythm, normal heart sounds and intact distal pulses.   Pulmonary/Chest: Effort normal and breath sounds normal.  Lymphadenopathy:    He has no cervical adenopathy.          Assessment & Plan:  He seems to be doing well. His CAD is stable. HTN is well controlled. We will get labs today including an A1c.

## 2015-04-01 NOTE — Progress Notes (Signed)
Pre visit review using our clinic review tool, if applicable. No additional management support is needed unless otherwise documented below in the visit note. 

## 2015-04-03 ENCOUNTER — Telehealth: Payer: Self-pay | Admitting: Family Medicine

## 2015-04-03 NOTE — Telephone Encounter (Signed)
Pt wife calling needing test results, unable to read it from [facility]

## 2015-04-05 NOTE — Telephone Encounter (Signed)
Yes, stay on Zocor

## 2015-04-05 NOTE — Telephone Encounter (Signed)
I spoke with pt and went over lab results. Pt started back on his Zocor 40 mg about 3 days ago, he just wants to confirm this with you?

## 2015-04-05 NOTE — Telephone Encounter (Signed)
I spoke with pt  

## 2015-04-10 ENCOUNTER — Telehealth: Payer: Self-pay | Admitting: Cardiovascular Disease

## 2015-04-10 NOTE — Telephone Encounter (Signed)
Patient calling the office for samples of medication:   1.  What medication and dosage are you requesting samples for? Bystolic    2.  Are you currently out of this medication?yes   3. Are you requesting samples to get you through until a mail order prescription arrives?no

## 2015-04-10 NOTE — Telephone Encounter (Signed)
Patient aware samples are at the front desk for pick up  

## 2015-04-24 ENCOUNTER — Other Ambulatory Visit: Payer: Self-pay | Admitting: Family Medicine

## 2015-04-24 MED ORDER — HYDROCODONE-ACETAMINOPHEN 10-325 MG PO TABS: 1.0000 | ORAL_TABLET | Freq: Four times a day (QID) | ORAL | Status: DC | PRN

## 2015-04-24 NOTE — Telephone Encounter (Signed)
Pt need new rx hydrocodone °

## 2015-04-24 NOTE — Telephone Encounter (Signed)
done

## 2015-04-25 NOTE — Telephone Encounter (Signed)
Called and spoke with pt and pt is aware.  

## 2015-04-30 ENCOUNTER — Other Ambulatory Visit: Payer: Self-pay | Admitting: *Deleted

## 2015-04-30 MED ORDER — GABAPENTIN 300 MG PO CAPS: 300.0000 mg | ORAL_CAPSULE | Freq: Two times a day (BID) | ORAL | Status: DC

## 2015-04-30 MED ORDER — METFORMIN HCL 1000 MG PO TABS: 1000.0000 mg | ORAL_TABLET | Freq: Two times a day (BID) | ORAL | Status: DC

## 2015-05-22 ENCOUNTER — Other Ambulatory Visit: Payer: Self-pay | Admitting: Family Medicine

## 2015-05-22 MED ORDER — HYDROCODONE-ACETAMINOPHEN 10-325 MG PO TABS: 1.0000 | ORAL_TABLET | Freq: Four times a day (QID) | ORAL | Status: DC | PRN

## 2015-05-22 NOTE — Telephone Encounter (Signed)
° ° °  Pt request refill of the following: ° ° °HYDROcodone-acetaminophen (NORCO) 10-325 MG tablet ° ° °Phamacy: °

## 2015-05-22 NOTE — Telephone Encounter (Signed)
done

## 2015-05-24 NOTE — Telephone Encounter (Signed)
Called and spoke with pt and pt is aware rx is ready.

## 2015-06-03 ENCOUNTER — Ambulatory Visit (INDEPENDENT_AMBULATORY_CARE_PROVIDER_SITE_OTHER): Payer: Medicare Other | Admitting: Family Medicine

## 2015-06-03 ENCOUNTER — Encounter: Payer: Self-pay | Admitting: Family Medicine

## 2015-06-03 VITALS — BP 131/74 | HR 70 | Temp 98.5°F | Ht 69.5 in | Wt 210.0 lb

## 2015-06-03 DIAGNOSIS — I2510 Atherosclerotic heart disease of native coronary artery without angina pectoris: Secondary | ICD-10-CM

## 2015-06-03 DIAGNOSIS — N410 Acute prostatitis: Secondary | ICD-10-CM | POA: Diagnosis not present

## 2015-06-03 LAB — POCT URINALYSIS DIPSTICK
Blood, UA: NEGATIVE
GLUCOSE UA: NEGATIVE
Ketones, UA: NEGATIVE
LEUKOCYTES UA: NEGATIVE
NITRITE UA: NEGATIVE
Spec Grav, UA: >=1.03
Urobilinogen, UA: 0.2
pH, UA: 5.5

## 2015-06-03 MED ORDER — DOXYCYCLINE HYCLATE 100 MG PO CAPS: 100.0000 mg | ORAL_CAPSULE | Freq: Two times a day (BID) | ORAL | Status: AC

## 2015-06-03 NOTE — Progress Notes (Signed)
Pre visit review using our clinic review tool, if applicable. No additional management support is needed unless otherwise documented below in the visit note. 

## 2015-06-03 NOTE — Progress Notes (Signed)
   Subjective:    Patient ID: [name], male    DOB: 04-13-1951, 64 y.o.   MRN: [id number]  HPI Here for 9 days of not feeling well, low grade fevers, urgency to urinate and fatigue. No URI symptoms. He drinks plenty of water. He has taken 7 days of Bactrim DS that he had at his home with no response. Then he took 2 days of Amoxicillin with little response.    Review of Systems  Constitutional: Positive for fever and chills. Negative for diaphoresis.  HENT: Negative.   Eyes: Negative.   Respiratory: Negative.   Cardiovascular: Negative.   Gastrointestinal: Negative.   Genitourinary: Positive for urgency and frequency. Negative for hematuria, flank pain and testicular pain.       Objective:   Physical Exam  Constitutional: He is oriented to person, place, and time. He appears well-developed and well-nourished. No distress.  Neck: No thyromegaly present.  Cardiovascular: Normal rate, regular rhythm, normal heart sounds and intact distal pulses.   Pulmonary/Chest: Effort normal and breath sounds normal.  Abdominal: Soft. Bowel sounds are normal. He exhibits no distension and no mass. There is no tenderness. There is no rebound and no guarding.  Genitourinary:  Prostate is boggy, swollen, and tender   Lymphadenopathy:    He has no cervical adenopathy.  Neurological: He is alert and oriented to person, place, and time.          Assessment & Plan:  Prostatitis, treat with Doxycycline.

## 2015-06-03 NOTE — Addendum Note (Signed)
Addended by: Aniceto BossNIMMONS, SYLVIA A on: 06/03/2015 02:55 PM   Modules accepted: Orders

## 2015-06-20 ENCOUNTER — Other Ambulatory Visit: Payer: Self-pay | Admitting: Cardiovascular Disease

## 2015-06-20 NOTE — Telephone Encounter (Signed)
Rx(s) sent to pharmacy electronically.  

## 2015-06-24 ENCOUNTER — Telehealth: Payer: Self-pay | Admitting: Family Medicine

## 2015-06-24 ENCOUNTER — Other Ambulatory Visit: Payer: Self-pay | Admitting: Family Medicine

## 2015-06-24 MED ORDER — HYDROCODONE-ACETAMINOPHEN 10-325 MG PO TABS: 1.0000 | ORAL_TABLET | Freq: Four times a day (QID) | ORAL | Status: DC | PRN

## 2015-06-24 NOTE — Telephone Encounter (Signed)
done

## 2015-06-24 NOTE — Telephone Encounter (Signed)
Pt is aware that his Rx is ready for pick up  

## 2015-06-24 NOTE — Telephone Encounter (Signed)
° ° °  Pt request refill of the following: ° ° °HYDROcodone-acetaminophen (NORCO) 10-325 MG tablet ° ° °Phamacy: °

## 2015-07-04 ENCOUNTER — Telehealth: Payer: Self-pay | Admitting: Cardiovascular Disease

## 2015-07-04 NOTE — Telephone Encounter (Signed)
Patient calling the office for samples of medication:   1.  What medication and dosage are you requesting samples for? Bystolic 10 mg  2.  Are you currently out of this medication? 1 left

## 2015-07-04 NOTE — Telephone Encounter (Signed)
Request Bystolic samples Only 5 mg Bystolic on hand Instructed patient to take two 5 mg tablets a day to equal his normal dose of 10 mg a day Repeated instructions correctly Medication Samples have been provided to the patient.  Drug name: Bystolic 5 mg  Qty: #14 pills   LOT: [id number]  Exp.Date: both bottles 8\18  The patient has been instructed regarding the correct time, dose, and frequency of taking this medication, including desired effects and most common side effects.  Medications left at front desk in bag with patients name and dob  [name] 1:04 PM 07/04/2015

## 2015-07-22 ENCOUNTER — Telehealth: Payer: Self-pay | Admitting: Cardiovascular Disease

## 2015-07-22 MED ORDER — NEBIVOLOL HCL 10 MG PO TABS: 10.0000 mg | ORAL_TABLET | Freq: Every day | ORAL | Status: DC

## 2015-07-22 NOTE — Telephone Encounter (Signed)
Samples provided and patient aware available for pickup at front desk.

## 2015-07-22 NOTE — Telephone Encounter (Signed)
Patient calling the office for samples of medication:   1.  What medication and dosage are you requesting samples for? Bystolic  2.  Are you currently out of this medication? 4 pills left

## 2015-07-24 ENCOUNTER — Other Ambulatory Visit: Payer: Self-pay | Admitting: Family Medicine

## 2015-07-24 MED ORDER — HYDROCODONE-ACETAMINOPHEN 10-325 MG PO TABS: 1.0000 | ORAL_TABLET | Freq: Four times a day (QID) | ORAL | Status: DC | PRN

## 2015-07-24 NOTE — Telephone Encounter (Signed)
done

## 2015-07-24 NOTE — Telephone Encounter (Signed)
Patient notified that Rx placed up front for pick up. 

## 2015-07-24 NOTE — Telephone Encounter (Signed)
Patient needs a refill on his Hydrocodone °

## 2015-08-16 ENCOUNTER — Other Ambulatory Visit: Payer: Self-pay | Admitting: Family Medicine

## 2015-08-19 NOTE — Telephone Encounter (Signed)
Call in #240 with 5 rf 

## 2015-08-26 ENCOUNTER — Telehealth: Payer: Self-pay | Admitting: Family Medicine

## 2015-08-26 NOTE — Telephone Encounter (Signed)
Pt request refill  HYDROcodone-acetaminophen (NORCO) 10-325 MG tablet  Daughter would like to know if you will write 3 scripts so they do not have to come out here every month.

## 2015-08-26 NOTE — Telephone Encounter (Signed)
Please advise 

## 2015-08-27 MED ORDER — HYDROCODONE-ACETAMINOPHEN 10-325 MG PO TABS: 1.0000 | ORAL_TABLET | Freq: Four times a day (QID) | ORAL | Status: DC | PRN

## 2015-08-27 NOTE — Telephone Encounter (Signed)
Done for 3 months

## 2015-08-27 NOTE — Telephone Encounter (Signed)
Script is ready for pick up and I spoke with pt.  

## 2015-09-01 ENCOUNTER — Other Ambulatory Visit: Payer: Self-pay | Admitting: Family Medicine

## 2015-09-02 ENCOUNTER — Other Ambulatory Visit: Payer: Self-pay | Admitting: Cardiovascular Disease

## 2015-09-05 ENCOUNTER — Telehealth: Payer: Self-pay | Admitting: Cardiovascular Disease

## 2015-09-05 MED ORDER — NEBIVOLOL HCL 10 MG PO TABS
10.0000 mg | ORAL_TABLET | Freq: Every day | ORAL | Status: DC
Start: 2015-09-05 — End: 2015-10-08

## 2015-09-05 NOTE — Telephone Encounter (Signed)
Patient aware samples are at the front desk for pick up  

## 2015-09-05 NOTE — Telephone Encounter (Signed)
New message       Patient calling the office for samples of medication:   1.  What medication and dosage are you requesting samples for? bystolic  2.  Are you currently out of this medication? no

## 2015-09-25 ENCOUNTER — Telehealth: Payer: Self-pay | Admitting: Family Medicine

## 2015-09-25 NOTE — Telephone Encounter (Signed)
He needs an OV  

## 2015-09-25 NOTE — Telephone Encounter (Signed)
Pt has the flu and would like something [facility] [address]

## 2015-09-26 NOTE — Telephone Encounter (Signed)
Pt wife will talk with her husband to see if he want an appt

## 2015-10-07 ENCOUNTER — Telehealth: Payer: Self-pay | Admitting: Cardiovascular Disease

## 2015-10-07 NOTE — Telephone Encounter (Signed)
Patient calling the office for samples of medication:   1.  What medication and dosage are you requesting samples for? Bystolic 10 mg  2.  Are you currently out of this medication? No,enough until Friday

## 2015-10-08 MED ORDER — NEBIVOLOL HCL 10 MG PO TABS: 10.0000 mg | ORAL_TABLET | Freq: Every day | ORAL | Status: DC

## 2015-10-08 NOTE — Telephone Encounter (Signed)
Spoke with donna, aware there are no samples available today. Refill sent to the pharmacy.

## 2015-10-15 ENCOUNTER — Other Ambulatory Visit: Payer: Self-pay | Admitting: Family Medicine

## 2015-10-21 ENCOUNTER — Other Ambulatory Visit: Payer: Self-pay | Admitting: Family Medicine

## 2015-10-30 ENCOUNTER — Ambulatory Visit (INDEPENDENT_AMBULATORY_CARE_PROVIDER_SITE_OTHER): Payer: Medicare Other | Admitting: Cardiovascular Disease

## 2015-10-30 ENCOUNTER — Encounter: Payer: Self-pay | Admitting: Cardiovascular Disease

## 2015-10-30 ENCOUNTER — Telehealth: Payer: Self-pay | Admitting: *Deleted

## 2015-10-30 VITALS — BP 140/78 | HR 52 | Ht 69.0 in | Wt 208.0 lb

## 2015-10-30 DIAGNOSIS — I2510 Atherosclerotic heart disease of native coronary artery without angina pectoris: Secondary | ICD-10-CM

## 2015-10-30 DIAGNOSIS — E1140 Type 2 diabetes mellitus with diabetic neuropathy, unspecified: Secondary | ICD-10-CM

## 2015-10-30 DIAGNOSIS — E669 Obesity, unspecified: Secondary | ICD-10-CM | POA: Diagnosis not present

## 2015-10-30 DIAGNOSIS — E785 Hyperlipidemia, unspecified: Secondary | ICD-10-CM

## 2015-10-30 DIAGNOSIS — I10 Essential (primary) hypertension: Secondary | ICD-10-CM | POA: Diagnosis not present

## 2015-10-30 MED ORDER — SIMVASTATIN 20 MG PO TABS: 20.0000 mg | ORAL_TABLET | Freq: Every day | ORAL | Status: DC

## 2015-10-30 MED ORDER — EZETIMIBE 10 MG PO TABS: 10.0000 mg | ORAL_TABLET | Freq: Every day | ORAL | Status: DC

## 2015-10-30 MED ORDER — PITAVASTATIN CALCIUM 4 MG PO TABS: 1.0000 | ORAL_TABLET | Freq: Every day | ORAL | Status: DC

## 2015-10-30 NOTE — Patient Instructions (Addendum)
Your physician wants you to follow-up in: 1 year or sooner if needed. You will receive a reminder letter in the mail two months in advance. If you don't receive a letter, please call our office to schedule the follow-up appointment.  If you need a refill on your cardiac medications before your next appointment, please call your pharmacy. Your physician has recommended you make the following change in your medication:   1.) the simvastatin has been decreased to 20 mg daily. ( 1/2 tablet)   A new 20 mg prescription is on hold at your pharmacy.  2.) a new prescription has been sent to the pharmacy for generic Zetia.  Your physician recommends that you return for lab work in: 2-3 months.

## 2015-10-30 NOTE — Telephone Encounter (Signed)
Returned a call to patient's wife. Per Dr Tresa Endokelly he can try Livalo 4 mg.

## 2015-10-30 NOTE — Telephone Encounter (Signed)
Per message left on refill voicemail by patients wife, they are not willing to pay $47 for one bottle of the generic zetia. She wanted to know if the patient could be switched to livalo or something more affordable. She can be reached at [phone]. Thanks, MI

## 2015-11-01 ENCOUNTER — Encounter: Payer: Self-pay | Admitting: Cardiovascular Disease

## 2015-11-01 DIAGNOSIS — E119 Type 2 diabetes mellitus without complications: Secondary | ICD-10-CM | POA: Insufficient documentation

## 2015-11-01 NOTE — Progress Notes (Signed)
Patient ID: [name], male   DOB: [birth year], 65 y.o.   MRN: [id number]      Primary M.D.: Dr. Alysia Penna  HPI: [name] is a 65 y.o. male presents to the office today for a follow up cardiology evaluation.   [name] has CAD and in June 2002 underwent CABG surgery by Dr. Roxan Hockey with a sequential LIMA graft to the second diagonal and LAD, and sequential radial graft to the first and third diagonal branches. An echo Doppler study in 2010 showed normal systolic function with mild MR and trace TR.  A  nuclear perfusion study in March 2013 showed normal perfusion and function without scar or ischemia.  Additional problems include hypertension, type 2 diabetes mellitus, and hyperlipidemia. Remotely, he had worked as a Garment/textile technologist minor and in the past was some question of possible black lung. 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  In November 2015 he complained of a slight change in symptomatology with exertional shortness of breath and some arm weakness.  Prior to bypass surgery, he had never experienced chest pain but demonstrated exertional shortness of breath and arm discomfort.  He admitted to fatigue.  He denied any major episodes of chest pressure.  A 2-D echo Doppler study on 06/14/2014 showed an ejection fraction of 55-60% with mild focal basal hypertrophy of the septum; grade 1 diastolic dysfunction.  There was evidence for mild mitral regurgitation and moderate left atrial dilatation.  A nuclear perfusion study on December 11,2015 was interpreted as low risk.  He developed 1-2 mm of horizontal ST segment depression and had normal scintigraphic myocardial perfusion images.  Ejection fraction was 57%.  There was normal LV function without wall motion abnormality.  He has a history of hyperlipidemia and remotely self increased his simvastatin from 40 mg to 80 mg and developed occasional myalgias.  He has  been on Bystolic 10 mg and lisinopril 30 mg for his hypertension.  He is on metformin 1000 g twice a day as well as DiaBeta 5 mg for his type 2 diabetes mellitus.  When I last saw him again the December, I recommended he reduce his simvastatin to 40 mg.  I also added amlodipine 5 mg more optimal blood pressure control.  He has felt well with this adjustment.  He denies any chest pain.  He denies palpitations.  He is very busy with his church.  He has not had recent lab work.  He was questioning changing his lipid therapy.  He presents for evaluation   Past Medical History  Diagnosis Date  . Hyperlipidemia   . Diabetes mellitus   . Insomnia   . Chronic headache   . Chronic neck pain   . Hypertension 12/26/08    ECHO- EF>55%  . CAD (coronary artery disease)     nuclear stress test-09/09/11 low risk scan EF61%    Past Surgical History  Procedure Laterality Date  . Appendectomy    . Coronary artery bypass graft  2002    4 vessels  . Cervical spine injection    . Colonoscopy  04-02-06     per [facility], clear, repeat in 10 yrs    Allergies  Allergen Reactions  . Codeine Anaphylaxis    Pt. verbalized that he has taken and can take Hydrocodone, Oxycodone w/o issue - codeine allergy only  . Ciprofloxacin Hives    REACTION: rash  . Clonazepam  Ringing in the ears   . Crestor [Rosuvastatin]   . Isosorbide Nitrate Other (See Comments)    Headaches  . Lipitor [Atorvastatin]   . Zolpidem Tartrate     REACTION: HA's    Current Outpatient Prescriptions  Medication Sig Dispense Refill  . ACCU-CHEK AVIVA PLUS test strip TEST SUGAR ONCE A DAY 100 each 1  . amLODipine (NORVASC) 5 MG tablet TAKE 1 TABLET EVERY DAY 90 tablet 2  . aspirin 81 MG tablet Take 81 mg by mouth daily.     . cetirizine (ZYRTEC) 10 MG tablet Take 10 mg by mouth daily.    . cholecalciferol (VITAMIN D) 1000 UNITS tablet Take 1,000 Units by mouth 2 (two) times daily.      . fexofenadine (ALLEGRA) 180 MG tablet  Take 180 mg by mouth daily.    . fish oil-omega-3 fatty acids 1000 MG capsule Take 2 g by mouth daily.      Marland Kitchen gabapentin (NEURONTIN) 300 MG capsule Take 1 capsule (300 mg total) by mouth 2 (two) times daily. 180 capsule 3  . glyBURIDE (DIABETA) 5 MG tablet TAKE 1 TABLET BY MOUTH TWICE A DAY 60 tablet 6  . HYDROcodone-acetaminophen (NORCO) 10-325 MG tablet Take 1 tablet by mouth every 6 (six) hours as needed for severe pain. 120 tablet 0  . JANUVIA 100 MG tablet TAKE 1 TABLET EVERY DAY 30 tablet 0  . lisinopril (PRINIVIL,ZESTRIL) 20 MG tablet TAKE 1& 1/2 TABLETS BY MOUTH DAILY 135 tablet 3  . metFORMIN (GLUCOPHAGE) 1000 MG tablet TAKE 1 TABLET BY MOUTH TWICE A DAY 60 tablet 3  . Multiple Vitamin (MULTIVITAMIN) tablet Take 1 tablet by mouth daily.      . nebivolol (BYSTOLIC) 10 MG tablet Take 1 tablet (10 mg total) by mouth daily. 30 tablet 0  . sulfamethoxazole-trimethoprim (BACTRIM DS,SEPTRA DS) 800-160 MG tablet TAKE 1 TABLET TWICE A DAY 60 tablet 0  . traMADol (ULTRAM) 50 MG tablet TAKE 2 TABLETS BY MOUTH EVERY 6 HOURS AS NEEDED FOR PAIN 240 tablet 5  . Pitavastatin Calcium (LIVALO) 4 MG TABS Take 1 tablet (4 mg total) by mouth daily. 28 tablet 0  . simvastatin (ZOCOR) 20 MG tablet Take 1 tablet (20 mg total) by mouth at bedtime. 30 tablet 11   No current facility-administered medications for this visit.    Social History   Social History  . Marital Status: Married    Spouse Name: N/A  . Number of Children: N/A  . Years of Education: N/A   Occupational History  . Not on file.   Social History Main Topics  . Smoking status: Former Smoker    Quit date: 11/26/1970  . Smokeless tobacco: Never Used  . Alcohol Use: No  . Drug Use: No  . Sexual Activity: Not on file   Other Topics Concern  . Not on file   Social History Narrative    Family History  Problem Relation Age of Onset  . Coronary artery disease    . Hypertension    . Heart disease Mother   . Liver cancer Father     . Cancer Brother   . Cancer Brother   . Cancer Brother    Socially he is married. He has 3 children 8 grandchildren and 4 great-grandchildren. There is no tobacco or alcohol use. He is now retired.   ROS General: Negative; No fevers, chills, or night sweats;  HEENT: Decrease in hearing ,No changes in vision  sinus congestion, difficulty swallowing  Pulmonary: Negative; No cough, wheezing, shortness of breath, hemoptysis Cardiovascular: See history of present illness GI: Negative; No nausea, vomiting, diarrhea, or abdominal pain GU: Negative; No dysuria, hematuria, or difficulty voiding Musculoskeletal: Negative; no myalgias, joint pain, or weakness Hematologic/Oncology: Negative; no easy bruising, bleeding Endocrine: Positive for diabetes mellitus Neuro: Negative; no changes in balance, headaches Skin: Negative; No rashes or skin lesions Psychiatric: Negative; No behavioral problems, depression Sleep: Negative; No snoring, daytime sleepiness, hypersomnolence, bruxism, restless legs, hypnogognic hallucinations, no cataplexy Other comprehensive 14 point system review is negative.   PE BP 140/78 mmHg  Pulse 52  Ht _0  (1.753 m)  Wt 208 lb (94.348 kg)  BMI 30.70 kg/m2  Repeat blood pressure by me was elevated at 136/76  Wt Readings from Last 3 Encounters:  10/30/15 208 lb (94.348 kg)  06/03/15 210 lb (95.255 kg)  04/01/15 214 lb (97.07 kg)   General: Alert, oriented, no distress.  Skin: normal turgor, no rashes HEENT: Normocephalic, atraumatic. Pupils round and reactive; sclera anicteric;no lid lag.  Nose without nasal septal hypertrophy Mouth/Parynx benign; Mallinpatti scale 3 Neck: No JVD, no carotid bruits normal carotid upstroke Lungs: clear to ausculatation and percussion; no wheezing or rales Chest wall: Nontender to palpation Heart: RRR, s1 s2 normal 6-2/ 6 systolic murmur; no diastolic murmur.  No rubs thrills or heaves. Abdomen: Mild diastases recti soft,  nontender; no hepatosplenomehaly, BS+; abdominal aorta nontender and not dilated by palpation. Back:  No CVA tenderness Pulses 2+ Extremities: no clubbing cyanosis or edema, Homan's sign negative;  Neurologic: grossly nonfocal Psychologic: normal affect and mood.  ECG (independently read by me): Sinus bradycardia at 53 bpm.  Q waves in III and F.  March 2016 ECG (independently read by me): Normal sinus rhythm at 67.  No ectopy.  November 2015 ECG (independently read by me): Normal sinus rhythm at 65 bpm.  Early transition.  No significant ST segment changes.  Normal intervals.  Prior ECG: Normal sinus rhythm at 58 beats per minute. QTc interval 428 ms. PR interval 170 ms. Nonspecific ST changes  LABS:  BMET  BMP Latest Ref Rng 04/01/2015 05/28/2014 05/24/2013  Glucose 70 - 99 mg/dL 143(H) 109(H) 120(H)  BUN 6 - 23 mg/dL _1 Creatinine 0.40 - 1.50 mg/dL 0.80 0.82 0.91  Sodium 135 - 145 mEq/L 140 141 142  Potassium 3.5 - 5.1 mEq/L 3.9 4.2 4.6  Chloride 96 - 112 mEq/L 102 104 101  CO2 19 - 32 mEq/L _2 Calcium 8.4 - 10.5 mg/dL 9.4 9.5 10.0    Hepatic Function Latest Ref Rng 04/01/2015 05/28/2014 05/24/2013  Total Protein 6.0 - 8.3 g/dL 6.9 7.0 7.3  Albumin 3.5 - 5.2 g/dL 4.1 4.1 4.6  AST 0 - 37 U/L _3 ALT 0 - 53 U/L 33 36 39  Alk Phosphatase 39 - 117 U/L 84 83 82  Total Bilirubin 0.2 - 1.2 mg/dL 0.4 0.4 0.4  Bilirubin, Direct 0.0 - 0.3 mg/dL 0.1 - -    CBC Latest Ref Rng 04/01/2015 05/28/2014 05/24/2013  WBC 4.0 - 10.5 K/uL 7.3 8.3 7.4  Hemoglobin 13.0 - 17.0 g/dL 14.6 14.8 16.0  Hematocrit 39.0 - 52.0 % 44.2 43.1 45.6  Platelets 150.0 - 400.0 K/uL 181.0 182 222   Lab Results  Component Value Date   MCV 92.6 04/01/2015   MCV 91.3 05/28/2014   MCV 89.9 05/24/2013    Lab Results  Component Value Date   TSH 2.49 04/01/2015  Lab Results  Component Value Date   HGBA1C 7.0* 04/01/2015    BNP No results found for: PROBNP  Lipid Panel       Component Value Date/Time   CHOL 206* 04/01/2015 0937   CHOL 173 05/24/2013 1049   TRIG 189.0* 04/01/2015 0937   TRIG 185* 05/24/2013 1049   HDL 38.80* 04/01/2015 0937   HDL 39* 05/24/2013 1049   CHOLHDL 5 04/01/2015 0937   VLDL 37.8 04/01/2015 0937   LDLCALC 129* 04/01/2015 0937   LDLCALC 97 05/24/2013 1049     RADIOLOGY: No results found.    ASSESSMENT AND PLAN: Mr. Mora Appl is a 65 year old male who is 15 years status post CABG surgery  by Dr. Roxan Hockey  In November 2015 .  He noticed mild shortness of breath and fatigue.  In the past he never experienced classic substernal chest pressure.  At that time, I attempted to add low-dose isosorbide mononitrate, but unfortunately he developed a significant headache and stopped taking this.  A nuclear perfusion study continued to show normal perfusion but he did develop ST segment changes of 1-2 mm.  Since the addition of amlodipine to his medical regimen he has felt improved.  Today, his blood pressure is upper normal on Bystolic 10 mg, lisinopril 30 mg and amlodipine 5 mg daily.  He has been taking simvastatin 40 mg.  Blood work in September 2016 showed an LDL of 129.  I have suggested the addition of Zetia 10 mg to his simvastatin.  However, he is concerned about cost.  He also was questioning about the possibility of trying Livalo which his wife was recently started on and she seems to tolerate well.  He is diabetic on DiaBeta and metformin and Januvia.  He has peripheral neuropathy which is controlled with Neurontin.  Repeat blood work will be obtained in 2-3 months.  I will see him in one year for reevaluation.  Time spent: 25 minutes Troy Sine, MD, [facility]  11/01/2015 9:22 PM

## 2015-11-05 ENCOUNTER — Ambulatory Visit (INDEPENDENT_AMBULATORY_CARE_PROVIDER_SITE_OTHER): Payer: Medicare Other | Admitting: Family Medicine

## 2015-11-05 ENCOUNTER — Encounter: Payer: Self-pay | Admitting: Family Medicine

## 2015-11-05 VITALS — BP 123/66 | HR 65 | Temp 98.0°F | Ht 69.0 in | Wt 204.0 lb

## 2015-11-05 DIAGNOSIS — J209 Acute bronchitis, unspecified: Secondary | ICD-10-CM

## 2015-11-05 DIAGNOSIS — I2510 Atherosclerotic heart disease of native coronary artery without angina pectoris: Secondary | ICD-10-CM

## 2015-11-05 MED ORDER — HYDROCODONE-HOMATROPINE 5-1.5 MG/5ML PO SYRP: 5.0000 mL | ORAL_SOLUTION | ORAL | Status: DC | PRN

## 2015-11-05 MED ORDER — SULFAMETHOXAZOLE-TRIMETHOPRIM 800-160 MG PO TABS: 1.0000 | ORAL_TABLET | Freq: Two times a day (BID) | ORAL | Status: DC

## 2015-11-05 MED ORDER — CLARITHROMYCIN 500 MG PO TABS: 500.0000 mg | ORAL_TABLET | Freq: Two times a day (BID) | ORAL | Status: DC

## 2015-11-05 NOTE — Progress Notes (Signed)
Pre visit review using our clinic review tool, if applicable. No additional management support is needed unless otherwise documented below in the visit note. 

## 2015-11-05 NOTE — Progress Notes (Signed)
   Subjective:    Patient ID: Ala Bentenford H Kaufhold, male    DOB: [birth year], 65 y.o.   MRN: [id number]  HPI Here for 6 days of chest congestion and a dry cough. No fever. On Mucinex.    Review of Systems  Constitutional: Negative.   HENT: Positive for congestion, postnasal drip and sinus pressure. Negative for sore throat.   Eyes: Negative.   Respiratory: Positive for cough and chest tightness. Negative for shortness of breath.        Objective:   Physical Exam  Constitutional: He appears well-developed and well-nourished.  HENT:  Right Ear: External ear normal.  Left Ear: External ear normal.  Nose: Nose normal.  Mouth/Throat: Oropharynx is clear and moist.  Eyes: Conjunctivae are normal.  Neck: No thyromegaly present.  Pulmonary/Chest: Effort normal and breath sounds normal. No respiratory distress. He has no wheezes. He has no rales.  Lymphadenopathy:    He has no cervical adenopathy.          Assessment & Plan:  Bronchitis, treat with Biaxin.  Nelwyn SalisburyFRY,Ezmeralda Stefanick A, MD

## 2015-11-11 ENCOUNTER — Other Ambulatory Visit: Payer: Self-pay | Admitting: Family Medicine

## 2015-11-20 ENCOUNTER — Telehealth: Payer: Self-pay | Admitting: Cardiovascular Disease

## 2015-11-20 ENCOUNTER — Other Ambulatory Visit: Payer: Self-pay | Admitting: Cardiovascular Disease

## 2015-11-20 MED ORDER — PITAVASTATIN CALCIUM 4 MG PO TABS: 1.0000 | ORAL_TABLET | Freq: Every day | ORAL | Status: DC

## 2015-11-20 NOTE — Telephone Encounter (Signed)
New message       Need prior approval on livalo 4mg 

## 2015-11-20 NOTE — Telephone Encounter (Signed)
Rx request sent to pharmacy.  

## 2015-11-20 NOTE — Telephone Encounter (Signed)
New message     Pt c/o medication issue:  1. Name of Medication: Livalo  2. How are you currently taking this medication (dosage and times per day)? 4 mg po once daily  3. Are you having a reaction (difficulty breathing--STAT)? no  4. What is your medication issue? Dr. Tresa EndoKelly gave samples of the medication listed above and the wife states it is working  The pt needs it called in the pharmacy    *STAT* If patient is at the pharmacy, call can be transferred to refill team.   1. Which medications need to be refilled? (please list name of each medication and dose if known) Livalo 4 mg po once daily  2. Which pharmacy/location (including street and city if local pharmacy) is [facility] to [facility] sent to?[facility] [address]  3. Do they need a 30 day or 90 day supply? Uncertain-this is a sample medication starting with the wife called and stated it is working for the pt

## 2015-12-02 ENCOUNTER — Other Ambulatory Visit: Payer: Self-pay | Admitting: Cardiovascular Disease

## 2015-12-03 NOTE — Telephone Encounter (Signed)
Rx(s) sent to pharmacy electronically.  

## 2015-12-10 ENCOUNTER — Other Ambulatory Visit: Payer: Self-pay | Admitting: Cardiovascular Disease

## 2015-12-10 ENCOUNTER — Other Ambulatory Visit: Payer: Self-pay | Admitting: Family Medicine

## 2015-12-10 NOTE — Telephone Encounter (Signed)
Rx(s) sent to pharmacy electronically.  

## 2015-12-27 DIAGNOSIS — E785 Hyperlipidemia, unspecified: Secondary | ICD-10-CM | POA: Diagnosis not present

## 2015-12-27 DIAGNOSIS — I10 Essential (primary) hypertension: Secondary | ICD-10-CM | POA: Diagnosis not present

## 2015-12-27 LAB — COMPREHENSIVE METABOLIC PANEL
ALBUMIN: 4.2 g/dL (ref 3.6–5.1)
ALT: 28 U/L (ref 9–46)
AST: 28 U/L (ref 10–35)
Alkaline Phosphatase: 67 U/L (ref 40–115)
BUN: 10 mg/dL (ref 7–25)
CALCIUM: 9.4 mg/dL (ref 8.6–10.3)
CHLORIDE: 103 mmol/L (ref 98–110)
CO2: 29 mmol/L (ref 20–31)
CREATININE: 0.88 mg/dL (ref 0.70–1.25)
Glucose, Bld: 133 mg/dL — ABNORMAL HIGH (ref 65–99)
POTASSIUM: 4.3 mmol/L (ref 3.5–5.3)
Sodium: 139 mmol/L (ref 135–146)
TOTAL PROTEIN: 6.8 g/dL (ref 6.1–8.1)
Total Bilirubin: 0.4 mg/dL (ref 0.2–1.2)

## 2015-12-27 LAB — LIPID PANEL
CHOLESTEROL: 145 mg/dL (ref 125–200)
HDL: 41 mg/dL (ref >=40)
LDL CALC: 73 mg/dL (ref <130)
TRIGLYCERIDES: 153 mg/dL — AB (ref <150)
Total CHOL/HDL Ratio: 3.5 Ratio (ref <=5.0)
VLDL: 31 mg/dL — ABNORMAL HIGH (ref <30)

## 2016-01-03 ENCOUNTER — Other Ambulatory Visit: Payer: Self-pay | Admitting: Family Medicine

## 2016-01-03 NOTE — Telephone Encounter (Signed)
Pt needs refill on meformin

## 2016-01-03 NOTE — Telephone Encounter (Signed)
Done today.

## 2016-01-03 NOTE — Telephone Encounter (Signed)
Refill sent to pharmacy.   

## 2016-01-08 ENCOUNTER — Other Ambulatory Visit: Payer: Self-pay | Admitting: Family Medicine

## 2016-01-08 ENCOUNTER — Telehealth: Payer: Self-pay | Admitting: Family Medicine

## 2016-01-08 DIAGNOSIS — E119 Type 2 diabetes mellitus without complications: Secondary | ICD-10-CM

## 2016-01-08 MED ORDER — SITAGLIPTIN PHOSPHATE 100 MG PO TABS: 100.0000 mg | ORAL_TABLET | Freq: Every day | ORAL | Status: DC

## 2016-01-08 NOTE — Telephone Encounter (Addendum)
Pt needs a refill on januvia. Pt has a cpx sch for sept 2017. [facility] [address]

## 2016-01-08 NOTE — Telephone Encounter (Signed)
I left a voice message with below information. 

## 2016-01-08 NOTE — Telephone Encounter (Signed)
Pt has been sch

## 2016-01-08 NOTE — Telephone Encounter (Signed)
Per Dr. Clent RidgesFry order an A1c for pt to have drawn now, script for Januvia was sent e-scribe.

## 2016-01-09 ENCOUNTER — Other Ambulatory Visit (INDEPENDENT_AMBULATORY_CARE_PROVIDER_SITE_OTHER): Payer: Medicare Other

## 2016-01-09 DIAGNOSIS — E119 Type 2 diabetes mellitus without complications: Secondary | ICD-10-CM

## 2016-01-09 LAB — HEMOGLOBIN A1C: Hgb A1c MFr Bld: 6.5 % (ref 4.6–6.5)

## 2016-02-04 ENCOUNTER — Other Ambulatory Visit: Payer: Self-pay | Admitting: Family Medicine

## 2016-02-04 NOTE — Telephone Encounter (Signed)
Refill sent to pharmacy.   

## 2016-02-14 DIAGNOSIS — E119 Type 2 diabetes mellitus without complications: Secondary | ICD-10-CM | POA: Diagnosis not present

## 2016-02-18 ENCOUNTER — Telehealth: Payer: Self-pay | Admitting: Cardiovascular Disease

## 2016-02-18 MED ORDER — NEBIVOLOL HCL 10 MG PO TABS: 10.0000 mg | ORAL_TABLET | Freq: Every day | ORAL | 0 refills | Status: DC

## 2016-02-18 NOTE — Telephone Encounter (Signed)
New message      Pt needs samples of 10 mg bystolic. Please call.    Patient calling the office for samples of medication:   1.  What medication and dosage are you requesting samples for? Bystolic 10 mg  2.  Are you currently out of this medication? yes

## 2016-02-18 NOTE — Telephone Encounter (Signed)
Spoke to Friday HarborDonna and informed her the samples are available at front desk. She voiced thanks and acknowledgment.

## 2016-02-21 ENCOUNTER — Telehealth: Payer: Self-pay | Admitting: Family Medicine

## 2016-02-21 MED ORDER — HYDROCODONE-ACETAMINOPHEN 10-325 MG PO TABS: 1.0000 | ORAL_TABLET | Freq: Four times a day (QID) | ORAL | 0 refills | Status: DC | PRN

## 2016-02-21 NOTE — Telephone Encounter (Signed)
Done

## 2016-02-21 NOTE — Telephone Encounter (Signed)
° ° °  Pt req refill   HYDROcodone-acetaminophen (NORCO) 10-325 MG tablet

## 2016-02-22 ENCOUNTER — Other Ambulatory Visit: Payer: Self-pay | Admitting: Family Medicine

## 2016-02-24 NOTE — Telephone Encounter (Signed)
[id number] [place], [place] - [address]: [phone]  

## 2016-02-24 NOTE — Telephone Encounter (Signed)
Call in #240 with 5 rf 

## 2016-02-24 NOTE — Telephone Encounter (Signed)
Pt was notified that prescriptions are ready for pick up.

## 2016-03-12 ENCOUNTER — Ambulatory Visit: Payer: Medicare Other | Admitting: Physician Assistant

## 2016-03-20 ENCOUNTER — Other Ambulatory Visit: Payer: Self-pay | Admitting: Internal Medicine

## 2016-03-31 ENCOUNTER — Encounter: Payer: Medicare Other | Admitting: Family Medicine

## 2016-03-31 ENCOUNTER — Telehealth: Payer: Self-pay | Admitting: Family Medicine

## 2016-03-31 NOTE — Telephone Encounter (Signed)
Pt was on schedule for a physical today, I called and pt must have forgotten, he will reschedule.

## 2016-04-22 ENCOUNTER — Encounter: Payer: Self-pay | Admitting: Family Medicine

## 2016-04-22 ENCOUNTER — Ambulatory Visit (INDEPENDENT_AMBULATORY_CARE_PROVIDER_SITE_OTHER): Payer: Medicare Other | Admitting: Family Medicine

## 2016-04-22 ENCOUNTER — Encounter: Payer: Self-pay | Admitting: Internal Medicine

## 2016-04-22 VITALS — BP 125/78 | HR 56 | Temp 98.1°F | Ht 69.0 in | Wt 205.0 lb

## 2016-04-22 DIAGNOSIS — E785 Hyperlipidemia, unspecified: Secondary | ICD-10-CM

## 2016-04-22 DIAGNOSIS — E1140 Type 2 diabetes mellitus with diabetic neuropathy, unspecified: Secondary | ICD-10-CM

## 2016-04-22 DIAGNOSIS — I10 Essential (primary) hypertension: Secondary | ICD-10-CM

## 2016-04-22 DIAGNOSIS — I2510 Atherosclerotic heart disease of native coronary artery without angina pectoris: Secondary | ICD-10-CM

## 2016-04-22 DIAGNOSIS — N138 Other obstructive and reflux uropathy: Secondary | ICD-10-CM | POA: Diagnosis not present

## 2016-04-22 DIAGNOSIS — Z0000 Encounter for general adult medical examination without abnormal findings: Secondary | ICD-10-CM

## 2016-04-22 DIAGNOSIS — Z23 Encounter for immunization: Secondary | ICD-10-CM | POA: Diagnosis not present

## 2016-04-22 DIAGNOSIS — N401 Enlarged prostate with lower urinary tract symptoms: Secondary | ICD-10-CM

## 2016-04-22 DIAGNOSIS — E119 Type 2 diabetes mellitus without complications: Secondary | ICD-10-CM | POA: Diagnosis not present

## 2016-04-22 LAB — HEPATIC FUNCTION PANEL
ALBUMIN: 4.3 g/dL (ref 3.5–5.2)
ALK PHOS: 65 U/L (ref 39–117)
ALT: 28 U/L (ref 0–53)
AST: 26 U/L (ref 0–37)
Bilirubin, Direct: 0.1 mg/dL (ref 0.0–0.3)
TOTAL PROTEIN: 7 g/dL (ref 6.0–8.3)
Total Bilirubin: 0.4 mg/dL (ref 0.2–1.2)

## 2016-04-22 LAB — BASIC METABOLIC PANEL
BUN: 9 mg/dL (ref 6–23)
CALCIUM: 9.4 mg/dL (ref 8.4–10.5)
CHLORIDE: 103 meq/L (ref 96–112)
CO2: 30 meq/L (ref 19–32)
CREATININE: 0.95 mg/dL (ref 0.40–1.50)
GFR: 84.16 mL/min (ref >60.00)
GLUCOSE: 120 mg/dL — AB (ref 70–99)
Potassium: 4.2 mEq/L (ref 3.5–5.1)
Sodium: 141 mEq/L (ref 135–145)

## 2016-04-22 LAB — HEMOGLOBIN A1C: HEMOGLOBIN A1C: 6.1 % (ref 4.6–6.5)

## 2016-04-22 LAB — PSA: PSA: 1.56 ng/mL (ref 0.10–4.00)

## 2016-04-22 LAB — LIPID PANEL
CHOLESTEROL: 145 mg/dL (ref 0–200)
HDL: 37.8 mg/dL — AB (ref >39.00)
LDL Cholesterol: 93 mg/dL (ref 0–99)
NONHDL: 107.15
Total CHOL/HDL Ratio: 4
Triglycerides: 72 mg/dL (ref 0.0–149.0)
VLDL: 14.4 mg/dL (ref 0.0–40.0)

## 2016-04-22 LAB — CBC WITH DIFFERENTIAL/PLATELET
BASOS PCT: 0.7 % (ref 0.0–3.0)
Basophils Absolute: 0 10*3/uL (ref 0.0–0.1)
EOS ABS: 0.1 10*3/uL (ref 0.0–0.7)
Eosinophils Relative: 1.1 % (ref 0.0–5.0)
HCT: 42.8 % (ref 39.0–52.0)
Hemoglobin: 14.4 g/dL (ref 13.0–17.0)
Lymphocytes Relative: 28.1 % (ref 12.0–46.0)
Lymphs Abs: 2 10*3/uL (ref 0.7–4.0)
MCHC: 33.6 g/dL (ref 30.0–36.0)
MCV: 91.6 fl (ref 78.0–100.0)
MONO ABS: 0.7 10*3/uL (ref 0.1–1.0)
Monocytes Relative: 10 % (ref 3.0–12.0)
NEUTROS ABS: 4.2 10*3/uL (ref 1.4–7.7)
Neutrophils Relative %: 60.1 % (ref 43.0–77.0)
PLATELETS: 185 10*3/uL (ref 150.0–400.0)
RBC: 4.68 Mil/uL (ref 4.22–5.81)
RDW: 13.8 % (ref 11.5–15.5)
WBC: 7 10*3/uL (ref 4.0–10.5)

## 2016-04-22 LAB — POC URINALSYSI DIPSTICK (AUTOMATED)
BILIRUBIN UA: NEGATIVE
GLUCOSE UA: NEGATIVE
KETONES UA: NEGATIVE
Leukocytes, UA: NEGATIVE
Nitrite, UA: NEGATIVE
RBC UA: NEGATIVE
SPEC GRAV UA: 1.015
Urobilinogen, UA: 0.2
pH, UA: 6

## 2016-04-22 LAB — TSH: TSH: 2.38 u[IU]/mL (ref 0.35–4.50)

## 2016-04-22 NOTE — Progress Notes (Signed)
   Subjective:    Patient ID: Ala Bentenford H Myles, male    DOB: 08/20/1950, 65 y.o.   MRN: [id number]  HPI 65 yr old male to follow up on several issues and to have labs drawn. His BP is stable. His glucoses run around 120 most of the time, but he has been having some drops into the 40s or 50s. He has lost a fair amount of weight and watches a strict diet. He feels well and stays active.    Review of Systems  Constitutional: Negative.   HENT: Negative.   Eyes: Negative.   Respiratory: Negative.   Cardiovascular: Negative.   Gastrointestinal: Negative.   Genitourinary: Negative.   Musculoskeletal: Negative.   Skin: Negative.   Neurological: Negative.   Psychiatric/Behavioral: Negative.        Objective:   Physical Exam  Constitutional: He is oriented to person, place, and time. He appears well-developed and well-nourished. No distress.  HENT:  Head: Normocephalic and atraumatic.  Right Ear: External ear normal.  Left Ear: External ear normal.  Nose: Nose normal.  Mouth/Throat: Oropharynx is clear and moist. No oropharyngeal exudate.  Eyes: Conjunctivae and EOM are normal. Pupils are equal, round, and reactive to light. Right eye exhibits no discharge. Left eye exhibits no discharge. No scleral icterus.  Neck: Neck supple. No JVD present. No tracheal deviation present. No thyromegaly present.  Cardiovascular: Normal rate, regular rhythm, normal heart sounds and intact distal pulses.  Exam reveals no gallop and no friction rub.   No murmur heard. Pulmonary/Chest: Effort normal and breath sounds normal. No respiratory distress. He has no wheezes. He has no rales. He exhibits no tenderness.  Abdominal: Soft. Bowel sounds are normal. He exhibits no distension and no mass. There is no tenderness. There is no rebound and no guarding.  Genitourinary: Rectum normal, prostate normal and penis normal. Rectal exam shows guaiac negative stool. No penile tenderness.  Musculoskeletal: Normal  range of motion. He exhibits no edema or tenderness.  Lymphadenopathy:    He has no cervical adenopathy.  Neurological: He is alert and oriented to person, place, and time. He has normal reflexes. No cranial nerve deficit. He exhibits normal muscle tone. Coordination normal.  Skin: Skin is warm and dry. No rash noted. He is not diaphoretic. No erythema. No pallor.  Psychiatric: He has a normal mood and affect. His behavior is normal. Judgment and thought content normal.          Assessment & Plan:  His HTN and CAD are stable. His diabetes is well controlled and in fact we will stop the Glyburide completely. Get fasting labs today including an A1c.  Nelwyn SalisburyFRY,STEPHEN A, MD

## 2016-04-22 NOTE — Progress Notes (Signed)
Pre visit review using our clinic review tool, if applicable. No additional management support is needed unless otherwise documented below in the visit note. 

## 2016-04-23 ENCOUNTER — Other Ambulatory Visit: Payer: Self-pay | Admitting: Family Medicine

## 2016-04-29 ENCOUNTER — Other Ambulatory Visit: Payer: Self-pay | Admitting: Family Medicine

## 2016-05-01 ENCOUNTER — Telehealth: Payer: Self-pay | Admitting: Family Medicine

## 2016-05-01 NOTE — Telephone Encounter (Signed)
I spoke with pt and went over results. 

## 2016-05-01 NOTE — Telephone Encounter (Signed)
Pt would like blood work results °

## 2016-05-14 ENCOUNTER — Telehealth: Payer: Self-pay | Admitting: Cardiovascular Disease

## 2016-05-14 NOTE — Telephone Encounter (Signed)
Patient calling the office for samples of medication: ° ° °1.  What medication and dosage are you requesting samples for? Bystolic 10 mg  ° °2.  Are you currently out of this medication? yes ° ° °

## 2016-05-18 NOTE — Telephone Encounter (Signed)
Samples provided for patient, wife aware she may pick up any time.

## 2016-06-09 ENCOUNTER — Other Ambulatory Visit: Payer: Self-pay | Admitting: Cardiovascular Disease

## 2016-06-09 NOTE — Telephone Encounter (Signed)
Rx has been sent to the pharmacy electronically. ° °

## 2016-06-17 ENCOUNTER — Ambulatory Visit (AMBULATORY_SURGERY_CENTER): Payer: Self-pay

## 2016-06-17 VITALS — Ht 69.5 in | Wt 199.0 lb

## 2016-06-17 DIAGNOSIS — Z1211 Encounter for screening for malignant neoplasm of colon: Secondary | ICD-10-CM

## 2016-06-17 NOTE — Progress Notes (Signed)
No allergies to eggs or soy No past problems with anesthesia No diet meds No home oxygen  Declined emmi 

## 2016-06-25 ENCOUNTER — Telehealth: Payer: Self-pay | Admitting: Internal Medicine

## 2016-06-25 NOTE — Telephone Encounter (Signed)
Called pt back, explained to wife that I could see where prep instructions were changed but the appointment that was suppose to be scheduled had been filled since October, wasn't sure if the nurse looked at the wrong date or possibly the wrong dr's schedule, sincerely apologized for the mistake and offered to schedule pt, wife was very understanding and wanted to schedule appointment, she requested early morning appt, explained it may push procedure out a ways and she was fine with that, scheduled procedure for 08/19/16 at 830 am, printed new instructions for pt and did not make pt come back in for a pre-visit since it was our fault they had to reschedule. -adm

## 2016-07-01 ENCOUNTER — Encounter: Payer: Medicare Other | Admitting: Internal Medicine

## 2016-07-15 ENCOUNTER — Telehealth: Payer: Self-pay | Admitting: Cardiovascular Disease

## 2016-07-15 NOTE — Telephone Encounter (Signed)
New Message  Nurse voiced pt had quadruple bipass and pt is on antibiotic and wants to know if he needs to be triple medicated.  Nurse voiced pt was in chair and they were going to have to reschedule after I tried reaching out to and no one answered.  I voiced to nurse that's a surgical clearance nurse voiced it is not just needing verbal confirmation.  Please f/u with pt

## 2016-07-16 NOTE — Telephone Encounter (Signed)
Spoke with Dr Henriette CombsJohnson's office and advised no pre med indicated

## 2016-07-27 ENCOUNTER — Telehealth: Payer: Self-pay | Admitting: Family Medicine

## 2016-07-27 ENCOUNTER — Telehealth: Payer: Self-pay | Admitting: Cardiovascular Disease

## 2016-07-27 MED ORDER — HYDROCODONE-ACETAMINOPHEN 10-325 MG PO TABS: 1.0000 | ORAL_TABLET | Freq: Four times a day (QID) | ORAL | 0 refills | Status: DC | PRN

## 2016-07-27 NOTE — Telephone Encounter (Signed)
Done for one month only  

## 2016-07-27 NOTE — Telephone Encounter (Signed)
Pt request refill  °HYDROcodone-acetaminophen (NORCO) 10-325 MG tablet °

## 2016-07-27 NOTE — Telephone Encounter (Signed)
New Message  Patient calling the office for samples of medication:   1.  What medication and dosage are you requesting samples for? Bystolic 10mg    2.  Are you currently out of this medication? Yes

## 2016-07-28 NOTE — Telephone Encounter (Signed)
Script is ready for pick up here at front office and I left a message.  

## 2016-07-28 NOTE — Telephone Encounter (Signed)
Samples of bystolic 10 mg , left up at front, pt notified

## 2016-08-19 ENCOUNTER — Encounter: Payer: Medicare Other | Admitting: Internal Medicine

## 2016-09-06 ENCOUNTER — Other Ambulatory Visit: Payer: Self-pay | Admitting: Family Medicine

## 2016-09-08 NOTE — Telephone Encounter (Signed)
Call in #240 with 5 rf 

## 2016-09-16 ENCOUNTER — Other Ambulatory Visit: Payer: Self-pay | Admitting: Cardiovascular Disease

## 2016-09-16 NOTE — Telephone Encounter (Signed)
Rx(s) sent to pharmacy electronically.  

## 2016-09-25 ENCOUNTER — Telehealth: Payer: Self-pay | Admitting: Cardiovascular Disease

## 2016-09-25 NOTE — Telephone Encounter (Signed)
Medication samples have been provided to the patient.  Drug name: Bystolic 10mg   Qty: 28  LOT: [id number]  Exp.Date: 03/18  Samples left at front desk for patient pick-up. Patient wife aware (ok per DPR)    Aware follow up appointment due next month, states will make appointment when she comes to pick samples up.

## 2016-09-25 NOTE — Telephone Encounter (Signed)
°  New Prob   Patient calling the office for samples of medication:   1.  What medication and dosage are you requesting samples for? Bystolic 10 mg  2.  Are you currently out of this medication? 4-5 tablets left.

## 2016-10-12 ENCOUNTER — Other Ambulatory Visit: Payer: Self-pay | Admitting: Family Medicine

## 2016-10-26 ENCOUNTER — Telehealth: Payer: Self-pay | Admitting: Family Medicine

## 2016-10-26 NOTE — Telephone Encounter (Signed)
Pt request refill  HYDROcodone-acetaminophen (NORCO) 10-325 MG tablet  And traMADol (ULTRAM) 50 MG tablet  [id number] [place], [place] - [address]

## 2016-10-27 MED ORDER — HYDROCODONE-ACETAMINOPHEN 10-325 MG PO TABS: 1.0000 | ORAL_TABLET | Freq: Four times a day (QID) | ORAL | 0 refills | Status: DC | PRN

## 2016-10-27 NOTE — Telephone Encounter (Signed)
I spoke with pt and went over below information. 

## 2016-10-27 NOTE — Telephone Encounter (Signed)
Norco is ready. He already as plenty of refills for Tramadol available

## 2016-11-05 ENCOUNTER — Encounter: Payer: Self-pay | Admitting: Cardiovascular Disease

## 2016-11-05 ENCOUNTER — Telehealth: Payer: Self-pay | Admitting: Cardiovascular Disease

## 2016-11-05 ENCOUNTER — Ambulatory Visit (INDEPENDENT_AMBULATORY_CARE_PROVIDER_SITE_OTHER): Payer: Medicare Other | Admitting: Cardiovascular Disease

## 2016-11-05 VITALS — BP 134/64 | HR 59 | Ht 69.5 in | Wt 200.6 lb

## 2016-11-05 DIAGNOSIS — I2510 Atherosclerotic heart disease of native coronary artery without angina pectoris: Secondary | ICD-10-CM

## 2016-11-05 DIAGNOSIS — E118 Type 2 diabetes mellitus with unspecified complications: Secondary | ICD-10-CM | POA: Diagnosis not present

## 2016-11-05 DIAGNOSIS — E785 Hyperlipidemia, unspecified: Secondary | ICD-10-CM

## 2016-11-05 DIAGNOSIS — I10 Essential (primary) hypertension: Secondary | ICD-10-CM

## 2016-11-05 DIAGNOSIS — F419 Anxiety disorder, unspecified: Secondary | ICD-10-CM | POA: Diagnosis not present

## 2016-11-05 DIAGNOSIS — Z79899 Other long term (current) drug therapy: Secondary | ICD-10-CM | POA: Diagnosis not present

## 2016-11-05 DIAGNOSIS — G63 Polyneuropathy in diseases classified elsewhere: Secondary | ICD-10-CM

## 2016-11-05 MED ORDER — CLONAZEPAM 0.5 MG PO TABS: 0.5000 mg | ORAL_TABLET | Freq: Every day | ORAL | 0 refills | Status: DC

## 2016-11-05 MED ORDER — CLONAZEPAM 0.5 MG PO TABS
0.5000 mg | ORAL_TABLET | Freq: Every day | ORAL | 0 refills | Status: DC
Start: 2016-11-05 — End: 2016-11-05

## 2016-11-05 NOTE — Patient Instructions (Signed)
Your physician recommends that you return for lab work fasting.   Your physician has recommended you make the following change in your medication:   1.) your prescription for klonopin has been filled for 30 pills only. FUTURE REFILLS WILL NEED TO COME FROM  YOUR PCP.  Your physician wants you to follow-up in: 6 months or sooner if needed. You will receive a reminder letter in the mail two months in advance. If you don't receive a letter, please call our office to schedule the follow-up appointment.

## 2016-11-05 NOTE — Telephone Encounter (Signed)
Advised ok to fill per Dr Tresa EndoKelly but not to take on same day Called patient and reminded NOT to use Klonopin and Hydrocodone on same day ant that Klonopin is as needed, verbalized understanding

## 2016-11-05 NOTE — Progress Notes (Signed)
Patient ID: [name], male   DOB: [birth year], 66 y.o.   MRN: [id number]      Primary M.D.: Dr. Alysia Penna  HPI: [name] is a 66 y.o. male presents to the office today for a one year follow up cardiology evaluation.   [name] has CAD and in June 2002 underwent CABG surgery by Dr. Roxan Hockey with a sequential LIMA graft to the second diagonal and LAD, and sequential radial graft to the first and third diagonal branches. An echo Doppler study in 2010 showed normal systolic function with mild MR and trace TR.  A  nuclear perfusion study in March 2013 showed normal perfusion and function without scar or ischemia.  Additional problems include hypertension, type 2 diabetes mellitus, and hyperlipidemia. Remotely, he had worked as a Garment/textile technologist minor and in the past was some question of possible black lung. 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  In November 2015 he complained of a slight change in symptomatology with exertional shortness of breath and some arm weakness.  Prior to bypass surgery, he had never experienced chest pain but demonstrated exertional shortness of breath and arm discomfort.  He admitted to fatigue.  He denied any major episodes of chest pressure.  A 2-D echo Doppler study on 06/14/2014 showed an ejection fraction of 55-60% with mild focal basal hypertrophy of the septum; grade 1 diastolic dysfunction.  There was evidence for mild mitral regurgitation and moderate left atrial dilatation.  A nuclear perfusion study on December 11,2015 was interpreted as low risk.  He developed 1-2 mm of horizontal ST segment depression and had normal scintigraphic myocardial perfusion images.  Ejection fraction was 57%.  There was normal LV function without wall motion abnormality.  He has a history of hyperlipidemia and remotely self increased his simvastatin from 40 mg to 80 mg and developed occasional myalgias.   He has been on Bystolic 10 mg and lisinopril 30 mg for his hypertension.  He is on metformin 1000 g twice a day as well as DiaBeta 5 mg for his type 2 diabetes mellitus.  When I last saw him in follow-up, I again recommended he reduce his simvastatin.  Apparently, since I last saw one year ago, he had stopped taking this and intermittently takes some of his wife's Livalo which he states he believes he can tolerate better.  He has continued to take amlodipine 5 mg, Bystolic 10 mg and lisinopril 20 mg for hypertension.  He is diabetic, on Januvia and metformin.  He had noticed muscle aches.  He has a peripheral neuropathy for which he takes gabapentin.  He continues to experience paresthesias in his feet.  He presents for evaluation.  Past Medical History:  Diagnosis Date  . CAD (coronary artery disease)    nuclear stress test-09/09/11 low risk scan EF61%  . Chronic headache   . Chronic neck pain   . Diabetes mellitus   . Hyperlipidemia   . Hypertension 12/26/08   ECHO- EF>55%  . Insomnia     Past Surgical History:  Procedure Laterality Date  . APPENDECTOMY    . cervical spine injection    . COLONOSCOPY  04-02-06    per [facility], clear, repeat in 10 yrs  . CORONARY ARTERY BYPASS GRAFT  2002   4 vessels    Allergies  Allergen Reactions  . Codeine Anaphylaxis    Pt. verbalized that he has taken and can take Hydrocodone,  Oxycodone w/o issue - codeine allergy only  . Ciprofloxacin Hives    REACTION: rash  . Clonazepam     Ringing in the ears   . Crestor [Rosuvastatin]   . Isosorbide Nitrate Other (See Comments)    Headaches  . Lipitor [Atorvastatin]   . Zolpidem Tartrate     REACTION: HA's    Current Outpatient Prescriptions  Medication Sig Dispense Refill  . ACCU-CHEK AVIVA PLUS test strip TEST SUGAR ONCE A DAY 100 each 1  . amLODipine (NORVASC) 5 MG tablet Take 1 tablet (5 mg total) by mouth daily. 90 tablet 0  . Ascorbic Acid (VITAMIN C) 1000 MG tablet Take 1,000 mg by  mouth daily.    Marland Kitchen aspirin 81 MG tablet Take 81 mg by mouth daily.     . cholecalciferol (VITAMIN D) 1000 UNITS tablet Take 1,000 Units by mouth 2 (two) times daily.      . fish oil-omega-3 fatty acids 1000 MG capsule Take 2 g by mouth daily.      Marland Kitchen gabapentin (NEURONTIN) 300 MG capsule TAKE 1 CAPSULE BY MOUTH TWICE A DAY 180 capsule 3  . HYDROcodone-acetaminophen (NORCO) 10-325 MG tablet Take 1 tablet by mouth every 6 (six) hours as needed for severe pain. 120 tablet 0  . JANUVIA 100 MG tablet TAKE 1 TABLET BY MOUTH EVERY DAY 30 tablet 2  . lisinopril (PRINIVIL,ZESTRIL) 20 MG tablet TAKE 1& 1/2 TABLETS BY MOUTH DAILY 135 tablet 2  . metFORMIN (GLUCOPHAGE) 1000 MG tablet TAKE 1 TABLET BY MOUTH TWICE A DAY 60 tablet 2  . nebivolol (BYSTOLIC) 10 MG tablet Take 1 tablet (10 mg total) by mouth daily. 28 tablet 0  . traMADol (ULTRAM) 50 MG tablet TAKE 2 TABLETS BY MOUTH EVERY 6 HOURS AS NEEDED FOR PAIN 140 tablet 5  . clonazePAM (KLONOPIN) 0.5 MG tablet Take 1 tablet (0.5 mg total) by mouth daily. As needed for  anxiety 30 tablet 0   No current facility-administered medications for this visit.     Social History   Social History  . Marital status: Married    Spouse name: N/A  . Number of children: N/A  . Years of education: N/A   Occupational History  . Not on file.   Social History Main Topics  . Smoking status: Former Smoker    Quit date: 11/26/1970  . Smokeless tobacco: Never Used  . Alcohol use No  . Drug use: No  . Sexual activity: Not on file   Other Topics Concern  . Not on file   Social History Narrative  . No narrative on file    Family History  Problem Relation Age of Onset  . Liver cancer Father   . Cancer Brother   . Cancer Brother   . Cancer Brother   . Coronary artery disease    . Hypertension    . Heart disease Mother   . Colon cancer Neg Hx    Socially he is married. He has 3 children 8 grandchildren and 4 great-grandchildren. There is no tobacco or  alcohol use. He is now retired.   ROS General: Negative; No fevers, chills, or night sweats;  HEENT: Decrease in hearing ,No changes in vision  sinus congestion, difficulty swallowing Pulmonary: Negative; No cough, wheezing, shortness of breath, hemoptysis Cardiovascular: See history of present illness GI: Negative; No nausea, vomiting, diarrhea, or abdominal pain GU: Negative; No dysuria, hematuria, or difficulty voiding Musculoskeletal: Negative; no myalgias, joint pain, or weakness Hematologic/Oncology: Negative; no  easy bruising, bleeding Endocrine: Positive for diabetes mellitus Neuro: Positive for paresthesias in his legs. Skin: Negative; No rashes or skin lesions Psychiatric: Negative; No behavioral problems, depression Sleep: Negative; No snoring, daytime sleepiness, hypersomnolence, bruxism, restless legs, hypnogognic hallucinations, no cataplexy Other comprehensive 14 point system review is negative.   PE BP 134/64   Pulse (!) 59   Ht 5' 9.5" (1.765 m)   Wt 200 lb 9.6 oz (91 kg)   BMI 29.20 kg/m   Repeat blood pressure by me was elevated at 136/76  Wt Readings from Last 3 Encounters:  11/05/16 200 lb 9.6 oz (91 kg)  06/17/16 199 lb (90.3 kg)  04/22/16 205 lb (93 kg)   General: Alert, oriented, no distress.  Skin: normal turgor, no rashes HEENT: Normocephalic, atraumatic. Pupils round and reactive; sclera anicteric;no lid lag.  Nose without nasal septal hypertrophy Mouth/Parynx benign; Mallinpatti scale 3 Neck: No JVD, no carotid bruits normal carotid upstroke Lungs: clear to ausculatation and percussion; no wheezing or rales Chest wall: Nontender to palpation Heart: RRR, s1 s2 normal 3-7/ 6 systolic murmur; no diastolic murmur.  No rubs thrills or heaves. Abdomen: Mild diastases recti soft, nontender; no hepatosplenomehaly, BS+; abdominal aorta nontender and not dilated by palpation. Back:  No CVA tenderness Pulses 2+ Extremities: no clubbing cyanosis or edema,  Homan's sign negative;  Neurologic: grossly nonfocal Psychologic: normal affect and mood.  ECG (independently read by me): Sinus bradycardia 59 bpm.  No significant ST-T changes.  Early transition.  April 2017 ECG (independently read by me): Sinus bradycardia at 53 bpm.  Q waves in III and F.  March 2016 ECG (independently read by me): Normal sinus rhythm at 67.  No ectopy.  November 2015 ECG (independently read by me): Normal sinus rhythm at 65 bpm.  Early transition.  No significant ST segment changes.  Normal intervals.  Prior ECG: Normal sinus rhythm at 58 beats per minute. QTc interval 428 ms. PR interval 170 ms. Nonspecific ST changes   LABS:  BMP Latest Ref Rng & Units 04/22/2016 12/27/2015 04/01/2015  Glucose 70 - 99 mg/dL 120(H) 133(H) 143(H)  BUN 6 - 23 mg/dL 9 10 11   Creatinine 0.40 - 1.50 mg/dL 0.95 0.88 0.80  Sodium 135 - 145 mEq/L 141 139 140  Potassium 3.5 - 5.1 mEq/L 4.2 4.3 3.9  Chloride 96 - 112 mEq/L 103 103 102  CO2 19 - 32 mEq/L 30 29 27   Calcium 8.4 - 10.5 mg/dL 9.4 9.4 9.4    Hepatic Function Latest Ref Rng & Units 04/22/2016 12/27/2015 04/01/2015  Total Protein 6.0 - 8.3 g/dL 7.0 6.8 6.9  Albumin 3.5 - 5.2 g/dL 4.3 4.2 4.1  AST 0 - 37 U/L 26 28 27   ALT 0 - 53 U/L 28 28 33  Alk Phosphatase 39 - 117 U/L 65 67 84  Total Bilirubin 0.2 - 1.2 mg/dL 0.4 0.4 0.4  Bilirubin, Direct 0.0 - 0.3 mg/dL 0.1 - 0.1    CBC Latest Ref Rng & Units 04/22/2016 04/01/2015 05/28/2014  WBC 4.0 - 10.5 K/uL 7.0 7.3 8.3  Hemoglobin 13.0 - 17.0 g/dL 14.4 14.6 14.8  Hematocrit 39.0 - 52.0 % 42.8 44.2 43.1  Platelets 150.0 - 400.0 K/uL 185.0 181.0 182   Lab Results  Component Value Date   MCV 91.6 04/22/2016   MCV 92.6 04/01/2015   MCV 91.3 05/28/2014    Lab Results  Component Value Date   TSH 2.38 04/22/2016   Lab Results  Component Value Date  HGBA1C 6.1 04/22/2016    BNP No results found for: PROBNP  Lipid Panel     Component Value Date/Time   CHOL 145  04/22/2016 1111   CHOL 173 05/24/2013 1049   TRIG 72.0 04/22/2016 1111   TRIG 185 (H) 05/24/2013 1049   HDL 37.80 (L) 04/22/2016 1111   HDL 39 (L) 05/24/2013 1049   CHOLHDL 4 04/22/2016 1111   VLDL 14.4 04/22/2016 1111   LDLCALC 93 04/22/2016 1111   LDLCALC 97 05/24/2013 1049     RADIOLOGY: No results found.  IMPRESSION:  1. Atherosclerosis of native coronary artery of native heart without angina pectoris   2. Essential hypertension   3. Type 2 diabetes mellitus with complication, without long-term current use of insulin ([place])   4. Hyperlipidemia LDL goal <70   5. Medication management   6. Polyneuropathy associated with underlying disease ([place])   7. Anxiety     ASSESSMENT AND PLAN: [name] is a 66 year old male who is 16 years status post CABG surgery  by Dr. Roxan Hockey  In November 2002 .  Remotely he had noticed mild shortness of breath and fatigue.  In the past he never experienced classic substernal chest pressure.  At that time, I attempted to add low-dose isosorbide mononitrate, but unfortunately he developed a significant headache and stopped taking this.  A nuclear perfusion study continued to show normal perfusion but he did develop ST segment changes of 1-2 mm.  Since the addition of amlodipine to his medical regimen he has felt improved.  His blood pressure today is controlled on amlodipine 5 mg, lisinopril 30 mg, and Bystolic 10 mg.  He continues to have issues with his peripheral neuropathy and takes gabapentin 300 mg.  He has established CAD with target LDL less than 70.  In October 2017.  A repeat lipid panel had shown his LDL cholesterol had risen to 93.  He tells me he had stopped his simvastatin and intermittently has been taking some of his wife's Livalo.  He has not had recent lab work.  He also has had some significant difficulty with anxiety, presenting preventing him from sleeping.  He states remotely he had a prescription for clonazepam which is which  had resolved his symptomatology.  Remotely he requested that I give him another prescription.  I told him that I would only give him 30 pills, but he would need to follow-up with his primary physician.  I am recommending a complete set of laboratory.  If his lipid studies remain elevated, we may be able to prescribe Livalo since he seems to have been able to tolerate this when he is taking his wife's medication.  I will see him in 6 months for follow-up evaluation.  Time spent: 25 minutes Troy Sine, MD, [facility]  11/06/2016 8:14 AM

## 2016-11-05 NOTE — Telephone Encounter (Signed)
New Message    [facility] two prescriptions that were sent in clonazePAM (KLONOPIN) 0.5 MG tablet and HYDROcodone-acetaminophen (NORCO) 10-325 MG tablet because they are being taken together. Pharmacy also needs to confirm these medications are supposed to be taken together. Requests a call back to speak to anyone but preferable Melony.

## 2016-11-06 LAB — CBC
HEMATOCRIT: 43.1 % (ref 38.5–50.0)
HEMOGLOBIN: 14.5 g/dL (ref 13.2–17.1)
MCH: 30.6 pg (ref 27.0–33.0)
MCHC: 33.6 g/dL (ref 32.0–36.0)
MCV: 90.9 fL (ref 80.0–100.0)
MPV: 10.2 fL (ref 7.5–12.5)
Platelets: 202 10*3/uL (ref 140–400)
RBC: 4.74 MIL/uL (ref 4.20–5.80)
RDW: 13.8 % (ref 11.0–15.0)
WBC: 7.9 10*3/uL (ref 3.8–10.8)

## 2016-11-06 LAB — LIPID PANEL
CHOL/HDL RATIO: 5.2 ratio — AB (ref <5.0)
Cholesterol: 187 mg/dL (ref <200)
HDL: 36 mg/dL — ABNORMAL LOW (ref >40)
LDL Cholesterol: 117 mg/dL — ABNORMAL HIGH (ref <100)
Triglycerides: 171 mg/dL — ABNORMAL HIGH (ref <150)
VLDL: 34 mg/dL — ABNORMAL HIGH (ref <30)

## 2016-11-06 LAB — COMPREHENSIVE METABOLIC PANEL
ALBUMIN: 4.1 g/dL (ref 3.6–5.1)
ALK PHOS: 74 U/L (ref 40–115)
ALT: 22 U/L (ref 9–46)
AST: 20 U/L (ref 10–35)
BILIRUBIN TOTAL: 0.4 mg/dL (ref 0.2–1.2)
BUN: 11 mg/dL (ref 7–25)
CALCIUM: 9.1 mg/dL (ref 8.6–10.3)
CO2: 28 mmol/L (ref 20–31)
CREATININE: 0.85 mg/dL (ref 0.70–1.25)
Chloride: 102 mmol/L (ref 98–110)
Glucose, Bld: 143 mg/dL — ABNORMAL HIGH (ref 65–99)
Potassium: 4.3 mmol/L (ref 3.5–5.3)
SODIUM: 139 mmol/L (ref 135–146)
TOTAL PROTEIN: 6.9 g/dL (ref 6.1–8.1)

## 2016-11-06 LAB — TSH: TSH: 2.97 m[IU]/L (ref 0.40–4.50)

## 2016-11-07 LAB — HEMOGLOBIN A1C
HEMOGLOBIN A1C: 6.4 % — AB (ref <5.7)
MEAN PLASMA GLUCOSE: 137 mg/dL

## 2016-11-16 ENCOUNTER — Other Ambulatory Visit: Payer: Self-pay | Admitting: *Deleted

## 2016-11-16 ENCOUNTER — Telehealth: Payer: Self-pay | Admitting: *Deleted

## 2016-11-16 DIAGNOSIS — E785 Hyperlipidemia, unspecified: Secondary | ICD-10-CM

## 2016-11-16 DIAGNOSIS — Z79899 Other long term (current) drug therapy: Secondary | ICD-10-CM

## 2016-11-16 MED ORDER — PITAVASTATIN CALCIUM 2 MG PO TABS: 1.0000 | ORAL_TABLET | Freq: Every day | ORAL | 6 refills | Status: DC

## 2016-11-16 NOTE — Telephone Encounter (Signed)
Discussed lab results and recommendations with patient and wife. They informed me that he has already been taking 2 mg of his wife's livalo on occasion. Not daily. He will start to take 2 mg daily and have labs rechecked in 3 months. He requested for a prescription to be sent in to the pharmacy.

## 2016-11-16 NOTE — Telephone Encounter (Signed)
-----   Message from Lennette Biharihomas A Kelly, MD sent at 11/15/2016 11:02 PM EDT ----- Hemoglobin A1c 6.4 in this diabetic male, show primary physician.  Lipid studies are increased.  He apparently did not tolerate numerous statins but has tried his wife's Livalo.  Start Livalo 1 mg daily and after several weeks, increase to 2 mg.  Recheck lipids and chemistry in 3 months

## 2016-12-09 ENCOUNTER — Telehealth: Payer: Self-pay | Admitting: Family Medicine

## 2016-12-09 ENCOUNTER — Telehealth: Payer: Self-pay | Admitting: Cardiovascular Disease

## 2016-12-09 NOTE — Telephone Encounter (Signed)
New message    Pt wife is calling.   Patient calling the office for samples of medication:   1.  What medication and dosage are you requesting samples for? Bystolic 10 mg  2.  Are you currently out of this medication? He's got 3 days worth left.

## 2016-12-09 NOTE — Telephone Encounter (Signed)
Pt need new Rx for ACCU CHEK AVIVA test strips  Pharm:  [facility] [address]

## 2016-12-09 NOTE — Telephone Encounter (Signed)
Medication samples have been provided to the patient.  Drug name: bystolic 10mg   Qty: 4 bottles  LOT: [id number]  Exp.Date: 9/19  Samples left at front desk for patient pick-up. Patient notified.  Julaine Fusilkins, [place] M 2:22 PM 12/09/2016

## 2016-12-10 MED ORDER — GLUCOSE BLOOD VI STRP: ORAL_STRIP | 1 refills | Status: DC

## 2016-12-10 NOTE — Telephone Encounter (Signed)
[facility] 

## 2016-12-11 ENCOUNTER — Other Ambulatory Visit: Payer: Self-pay | Admitting: Cardiovascular Disease

## 2016-12-11 ENCOUNTER — Telehealth: Payer: Self-pay | Admitting: Family Medicine

## 2016-12-11 NOTE — Telephone Encounter (Signed)
Pt needs new script for a different glucose monitor due to the one he has now is being recalled.  Pharm: [facility] [address].

## 2016-12-11 NOTE — Telephone Encounter (Signed)
Rx(s) sent to pharmacy electronically.  

## 2016-12-11 NOTE — Telephone Encounter (Signed)
I spoke with pt and he talked with drug store again, they now have strips in for pt.

## 2017-01-06 ENCOUNTER — Other Ambulatory Visit: Payer: Self-pay | Admitting: Family Medicine

## 2017-01-13 ENCOUNTER — Telehealth: Payer: Self-pay | Admitting: Family Medicine

## 2017-01-13 MED ORDER — HYDROCODONE-ACETAMINOPHEN 10-325 MG PO TABS: 1.0000 | ORAL_TABLET | Freq: Four times a day (QID) | ORAL | 0 refills | Status: DC | PRN

## 2017-01-13 NOTE — Telephone Encounter (Signed)
The Norco was written. The Tramadol is not due for renewal until 03-12-17

## 2017-01-13 NOTE — Telephone Encounter (Signed)
° ° °  Pt request refill of the following:   HYDROcodone-acetaminophen (NORCO) 10-325 MG tablet   traMADol (ULTRAM) 50 MG tablet  Phamacy:

## 2017-01-14 NOTE — Telephone Encounter (Signed)
Script is ready for pick up here at front office and I spoke with pt, also went over information about the Tramadol.

## 2017-01-26 ENCOUNTER — Other Ambulatory Visit: Payer: Self-pay | Admitting: Family Medicine

## 2017-02-08 ENCOUNTER — Telehealth: Payer: Self-pay | Admitting: Cardiovascular Disease

## 2017-02-08 NOTE — Telephone Encounter (Signed)
Medication samples have been provided to the patient.  Drug name: bystolic 10mg   Qty: 2 bottles  LOT: [id number]  Exp.Date: 12/19  Drug name: bystolic 10mg   Qty: 2 bottles  LOT: [id number]  Exp.Date: 10/19  Samples left at front desk for patient pick-up. Patient 's wife notified.  Julaine Fusilkins, Jenna M 2:15 PM 02/08/2017

## 2017-02-08 NOTE — Telephone Encounter (Signed)
Patient calling the office for samples of medication:   1.  What medication and dosage are you requesting samples for? Bystolic 10 mg  2.  Are you currently out of this medication? No, has enough until 02-11-17

## 2017-02-09 ENCOUNTER — Other Ambulatory Visit: Payer: Self-pay | Admitting: *Deleted

## 2017-02-09 ENCOUNTER — Encounter: Payer: Self-pay | Admitting: *Deleted

## 2017-02-09 DIAGNOSIS — Z79899 Other long term (current) drug therapy: Secondary | ICD-10-CM

## 2017-02-09 DIAGNOSIS — E785 Hyperlipidemia, unspecified: Secondary | ICD-10-CM

## 2017-02-20 LAB — COMPREHENSIVE METABOLIC PANEL
ALBUMIN: 4.1 g/dL (ref 3.6–5.1)
ALT: 26 U/L (ref 9–46)
AST: 22 U/L (ref 10–35)
Alkaline Phosphatase: 82 U/L (ref 40–115)
BUN: 14 mg/dL (ref 7–25)
CALCIUM: 9.3 mg/dL (ref 8.6–10.3)
CHLORIDE: 104 mmol/L (ref 98–110)
CO2: 24 mmol/L (ref 20–32)
CREATININE: 0.87 mg/dL (ref 0.70–1.25)
Glucose, Bld: 146 mg/dL — ABNORMAL HIGH (ref 65–99)
POTASSIUM: 4.3 mmol/L (ref 3.5–5.3)
SODIUM: 139 mmol/L (ref 135–146)
TOTAL PROTEIN: 6.7 g/dL (ref 6.1–8.1)
Total Bilirubin: 0.4 mg/dL (ref 0.2–1.2)

## 2017-02-20 LAB — LIPID PANEL
CHOLESTEROL: 170 mg/dL (ref <200)
HDL: 35 mg/dL — ABNORMAL LOW (ref >40)
LDL Cholesterol: 94 mg/dL (ref <100)
TRIGLYCERIDES: 206 mg/dL — AB (ref <150)
Total CHOL/HDL Ratio: 4.9 Ratio (ref <5.0)
VLDL: 41 mg/dL — AB (ref <30)

## 2017-02-22 ENCOUNTER — Telehealth: Payer: Self-pay | Admitting: Family Medicine

## 2017-02-22 MED ORDER — HYDROCODONE-ACETAMINOPHEN 10-325 MG PO TABS: 1.0000 | ORAL_TABLET | Freq: Four times a day (QID) | ORAL | 0 refills | Status: DC | PRN

## 2017-02-22 NOTE — Telephone Encounter (Signed)
Please advise on refill request Last filled 01/13/17  Last ov 04/22/16

## 2017-02-22 NOTE — Telephone Encounter (Signed)
Rx was signed and placed up front for pick up. Pt and pt's wife is aware. Nothing further is needed

## 2017-02-22 NOTE — Telephone Encounter (Signed)
Done

## 2017-02-22 NOTE — Telephone Encounter (Signed)
Pt need new rx hydrocodone °

## 2017-03-03 ENCOUNTER — Encounter: Payer: Self-pay | Admitting: Family Medicine

## 2017-03-03 ENCOUNTER — Ambulatory Visit (INDEPENDENT_AMBULATORY_CARE_PROVIDER_SITE_OTHER): Payer: Medicare Other | Admitting: Family Medicine

## 2017-03-03 VITALS — BP 128/77 | HR 64 | Temp 98.4°F | Ht 69.5 in | Wt 202.0 lb

## 2017-03-03 DIAGNOSIS — Z23 Encounter for immunization: Secondary | ICD-10-CM | POA: Diagnosis not present

## 2017-03-03 DIAGNOSIS — F5101 Primary insomnia: Secondary | ICD-10-CM | POA: Diagnosis not present

## 2017-03-03 DIAGNOSIS — G4700 Insomnia, unspecified: Secondary | ICD-10-CM | POA: Insufficient documentation

## 2017-03-03 MED ORDER — TEMAZEPAM 30 MG PO CAPS: 30.0000 mg | ORAL_CAPSULE | Freq: Every evening | ORAL | 0 refills | Status: DC | PRN

## 2017-03-03 NOTE — Progress Notes (Signed)
   Subjective:    Patient ID: [name], male    DOB: [birth year], 66 y.o.   MRN: [id number]  HPI Here to discuss trouble sleeping. He as struggles with this for years. He has been using Clonazepam with mixed results. He has tried Ambien in the past but had lots of side effects.    Review of Systems  Constitutional: Negative.   Respiratory: Negative.   Cardiovascular: Negative.   Psychiatric/Behavioral: Positive for sleep disturbance. Negative for dysphoric mood. The patient is not nervous/anxious.        Objective:   Physical Exam  Constitutional: He is oriented to person, place, and time. He appears well-developed and well-nourished.  Cardiovascular: Normal rate, regular rhythm, normal heart sounds and intact distal pulses.   Pulmonary/Chest: Effort normal and breath sounds normal. No respiratory distress. He has no wheezes.  Neurological: He is alert and oriented to person, place, and time.  Psychiatric: He has a normal mood and affect. His behavior is normal. Thought content normal.          Assessment & Plan:  Insomnia. Try Temazepam 30 mg qhs. Gershon CraneStephen Alexandra Posadas, MD

## 2017-03-03 NOTE — Patient Instructions (Signed)
WE NOW OFFER   [name] FAST TRACK!!!  SAME DAY Appointments for ACUTE CARE  Such as: Sprains, Injuries, cuts, abrasions, rashes, muscle pain, joint pain, back pain Colds, flu, sore throats, headache, allergies, cough, fever  Ear pain, sinus and eye infections Abdominal pain, nausea, vomiting, diarrhea, upset stomach Animal/insect bites  3 Easy Ways to Schedule: Walk-In Scheduling Call in scheduling Mychart Sign-up: [email or web address]         

## 2017-03-04 ENCOUNTER — Encounter: Payer: Self-pay | Admitting: Family Medicine

## 2017-03-05 ENCOUNTER — Other Ambulatory Visit: Payer: Self-pay | Admitting: Family Medicine

## 2017-03-05 ENCOUNTER — Telehealth: Payer: Self-pay | Admitting: Family Medicine

## 2017-03-05 MED ORDER — TRAZODONE HCL 100 MG PO TABS: 100.0000 mg | ORAL_TABLET | Freq: Every day | ORAL | 2 refills | Status: DC

## 2017-03-05 MED ORDER — CLONAZEPAM 0.5 MG PO TABS: 0.5000 mg | ORAL_TABLET | Freq: Two times a day (BID) | ORAL | 5 refills | Status: DC | PRN

## 2017-03-05 NOTE — Telephone Encounter (Signed)
Stop the Temazepam and call in Trazodone 100 mg qhs, #30 with 2 rf

## 2017-03-05 NOTE — Telephone Encounter (Signed)
done

## 2017-03-05 NOTE — Telephone Encounter (Signed)
I [facility] then had to call and cancel the order. Pt said he did talk with you about trying Clonazepam and a 90 day supply. He has tried this in past and it works.

## 2017-03-05 NOTE — Telephone Encounter (Signed)
[facility] and spoke with pt. 

## 2017-03-05 NOTE — Telephone Encounter (Signed)
Actually change the order to Clonazepam 0.5 mg to take one in the early evening and one at bedtime, call in #60 with 5 rf

## 2017-03-05 NOTE — Telephone Encounter (Signed)
Stop the Trazodone. Call in Clonazepam 1 mg to take qhs, #90 with one rf

## 2017-03-05 NOTE — Telephone Encounter (Signed)
° ° °  Pt wife call to say that pt can not take the below medicine  Would like a call back   temazepam (RESTORIL) 30 MG capsule   Pharmacy [facility] [address]

## 2017-03-07 ENCOUNTER — Other Ambulatory Visit: Payer: Self-pay | Admitting: Cardiovascular Disease

## 2017-03-09 NOTE — Telephone Encounter (Signed)
Rx(s) sent to pharmacy electronically.  

## 2017-03-23 ENCOUNTER — Other Ambulatory Visit: Payer: Self-pay

## 2017-03-23 NOTE — Telephone Encounter (Signed)
Pt asking for refill of hydrocodone.

## 2017-03-24 MED ORDER — HYDROCODONE-ACETAMINOPHEN 10-325 MG PO TABS
1.0000 | ORAL_TABLET | Freq: Four times a day (QID) | ORAL | 0 refills | Status: DC | PRN
Start: 2017-03-24 — End: 2017-04-21

## 2017-03-24 NOTE — Telephone Encounter (Signed)
Done

## 2017-03-25 ENCOUNTER — Encounter: Payer: Self-pay | Admitting: Family Medicine

## 2017-03-25 NOTE — Telephone Encounter (Signed)
Script is ready for pick up here at front office and tried to reach pt, no answer.  

## 2017-04-05 ENCOUNTER — Telehealth: Payer: Self-pay | Admitting: Cardiovascular Disease

## 2017-04-05 NOTE — Telephone Encounter (Signed)
Returned call to patient's wife.Bystolic 10 mg samples left at [facility] office front desk.

## 2017-04-05 NOTE — Telephone Encounter (Signed)
Patient calling the office for samples of medication: ° ° °1.  What medication and dosage are you requesting samples for? Bystolic 10mg ° °2.  Are you currently out of this medication? Yes ° ° ° °

## 2017-04-20 ENCOUNTER — Other Ambulatory Visit: Payer: Self-pay | Admitting: Family Medicine

## 2017-04-21 ENCOUNTER — Telehealth: Payer: Self-pay | Admitting: Family Medicine

## 2017-04-21 MED ORDER — HYDROCODONE-ACETAMINOPHEN 10-325 MG PO TABS: 1.0000 | ORAL_TABLET | Freq: Four times a day (QID) | ORAL | 0 refills | Status: DC | PRN

## 2017-04-21 NOTE — Telephone Encounter (Signed)
° ° °  Pt request refill of the following: ° ° °HYDROcodone-acetaminophen (NORCO) 10-325 MG tablet ° ° °Phamacy: °

## 2017-04-21 NOTE — Telephone Encounter (Signed)
Script is ready for pick up here at front office and spoke with pt, also letter was given.  

## 2017-04-21 NOTE — Telephone Encounter (Signed)
Done

## 2017-04-28 ENCOUNTER — Ambulatory Visit (INDEPENDENT_AMBULATORY_CARE_PROVIDER_SITE_OTHER): Payer: Medicare Other | Admitting: Family Medicine

## 2017-04-28 ENCOUNTER — Encounter: Payer: Self-pay | Admitting: Family Medicine

## 2017-04-28 VITALS — BP 138/86 | HR 63 | Temp 98.3°F | Ht 69.5 in | Wt 209.0 lb

## 2017-04-28 DIAGNOSIS — M8949 Other hypertrophic osteoarthropathy, multiple sites: Secondary | ICD-10-CM

## 2017-04-28 DIAGNOSIS — I10 Essential (primary) hypertension: Secondary | ICD-10-CM | POA: Diagnosis not present

## 2017-04-28 DIAGNOSIS — M159 Polyosteoarthritis, unspecified: Secondary | ICD-10-CM

## 2017-04-28 DIAGNOSIS — M150 Primary generalized (osteo)arthritis: Secondary | ICD-10-CM

## 2017-04-28 DIAGNOSIS — M1990 Unspecified osteoarthritis, unspecified site: Secondary | ICD-10-CM | POA: Insufficient documentation

## 2017-04-28 MED ORDER — HYDROCODONE-ACETAMINOPHEN 10-325 MG PO TABS: 1.0000 | ORAL_TABLET | Freq: Four times a day (QID) | ORAL | 0 refills | Status: DC | PRN

## 2017-04-28 MED ORDER — MELOXICAM 15 MG PO TABS: 15.0000 mg | ORAL_TABLET | Freq: Every day | ORAL | 3 refills | Status: DC

## 2017-04-28 NOTE — Progress Notes (Signed)
   Subjective:    Patient ID: [name], male    DOB: [birth year], 66 y.o.   MRN: [id number]  HPI Here to follow up on chronic pain. His right hip still hurts every day but lately his knees have been more stiff and painful also. The Norco helps to some extent. He has stopped taking any Gabapentin or Tramadol.    Review of Systems  Constitutional: Negative.   Respiratory: Negative.   Cardiovascular: Negative.   Musculoskeletal: Positive for arthralgias.       Objective:   Physical Exam  Constitutional: He appears well-developed and well-nourished.  Cardiovascular: Normal rate, regular rhythm, normal heart sounds and intact distal pulses.   Pulmonary/Chest: Effort normal and breath sounds normal.          Assessment & Plan:  Joint pains, especially knees and hips. Add Meloxicam 15 mg daily.  Gershon CraneStephen Fry, MD

## 2017-04-28 NOTE — Patient Instructions (Signed)
WE NOW OFFER   [facility] FAST TRACK!!!  SAME DAY Appointments for ACUTE CARE  Such as: Sprains, Injuries, cuts, abrasions, rashes, muscle pain, joint pain, back pain Colds, flu, sore throats, headache, allergies, cough, fever  Ear pain, sinus and eye infections Abdominal pain, nausea, vomiting, diarrhea, upset stomach Animal/insect bites  3 Easy Ways to Schedule: Walk-In Scheduling Call in scheduling Mychart Sign-up: [email or web address]         

## 2017-05-05 ENCOUNTER — Ambulatory Visit (INDEPENDENT_AMBULATORY_CARE_PROVIDER_SITE_OTHER): Payer: Medicare Other | Admitting: Cardiovascular Disease

## 2017-05-05 ENCOUNTER — Encounter: Payer: Self-pay | Admitting: Cardiovascular Disease

## 2017-05-05 VITALS — BP 135/72 | HR 60 | Ht 69.5 in | Wt 205.4 lb

## 2017-05-05 DIAGNOSIS — E785 Hyperlipidemia, unspecified: Secondary | ICD-10-CM

## 2017-05-05 DIAGNOSIS — I10 Essential (primary) hypertension: Secondary | ICD-10-CM | POA: Diagnosis not present

## 2017-05-05 DIAGNOSIS — Z951 Presence of aortocoronary bypass graft: Secondary | ICD-10-CM | POA: Diagnosis not present

## 2017-05-05 DIAGNOSIS — E782 Mixed hyperlipidemia: Secondary | ICD-10-CM | POA: Diagnosis not present

## 2017-05-05 DIAGNOSIS — G2581 Restless legs syndrome: Secondary | ICD-10-CM

## 2017-05-05 DIAGNOSIS — I2510 Atherosclerotic heart disease of native coronary artery without angina pectoris: Secondary | ICD-10-CM

## 2017-05-05 DIAGNOSIS — E118 Type 2 diabetes mellitus with unspecified complications: Secondary | ICD-10-CM

## 2017-05-05 MED ORDER — PITAVASTATIN CALCIUM 4 MG PO TABS: ORAL_TABLET | ORAL | 3 refills | Status: DC

## 2017-05-05 NOTE — Patient Instructions (Signed)
Medication Instructions:  Your physician has recommended you make the following change in your medication:  1) RESTART Livalo 2 mg by mouth ONCE daily - once get next refill you will need to cut the 4 mg tablets in half   Labwork: Your physician recommends that you return for lab work in: 3 months - January 2019   Testing/Procedures: none  Follow-Up: Your physician recommends that you schedule a follow-up appointment in: 4 months with Dr. Tresa EndoKelly.   Any Other Special Instructions Will Be Listed Below (If Applicable).     If you need a refill on your cardiac medications before your next appointment, please call your pharmacy.

## 2017-05-05 NOTE — Progress Notes (Signed)
Patient ID: [name], male   DOB: 04/11/1951, 66 y.o.   MRN: [id number]      Primary M.D.: Dr. Alysia Penna  HPI: [name] is a 66 y.o. male presents to the office today for a 6 month  follow up cardiology evaluation.   [name] has CAD and in June 2002 underwent CABG surgery by Dr. Roxan Hockey with a sequential LIMA graft to the second diagonal and LAD, and sequential radial graft to the first and third diagonal branches. An echo Doppler study in 2010 showed normal systolic function with mild MR and trace TR.  A  nuclear perfusion study in March 2013 showed normal perfusion and function without scar or ischemia.  Additional problems include hypertension, type 2 diabetes mellitus, and hyperlipidemia. Remotely, he had worked as a Garment/textile technologist minor and in the past was some question of possible black lung. 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  In November 2015 he complained of a slight change in symptomatology with exertional shortness of breath and some arm weakness.  Prior to bypass surgery, he had never experienced chest pain but demonstrated exertional shortness of breath and arm discomfort.  He admitted to fatigue.  He denied any major episodes of chest pressure.  A 2-D echo Doppler study on 06/14/2014 showed an ejection fraction of 55-60% with mild focal basal hypertrophy of the septum; grade 1 diastolic dysfunction.  There was evidence for mild mitral regurgitation and moderate left atrial dilatation.  A nuclear perfusion study on December 11,2015 was interpreted as low risk.  He developed 1-2 mm of horizontal ST segment depression and had normal scintigraphic myocardial perfusion images.  Ejection fraction was 57%.  There was normal LV function without wall motion abnormality.  He has a history of hyperlipidemia and remotely self increased his simvastatin from 40 mg to 80 mg and developed occasional myalgias.   He has been on Bystolic 10 mg and lisinopril 30 mg for his hypertension.  He is on metformin 1000 mg twice a day as well as DiaBeta 5 mg for his type 2 diabetes mellitus.  Since I last saw him in May 2018.  He denies any episodes of chest pain.  He stays busy doing many projects at home.  He tells me he stopped taking Livalo for the past month.  He has continued to be on amlodipine 5 mg, lisinopril 30 Milligan grams, and Bystolic 10 mg for hypertension.  He also has been taking omega-3 fatty acids.  He is diabetic on metformin and Januvia.  He hasn't issues with restless legs and was not benefited from requip or gabapentin.  He presents for reevaluation  Past Medical History:  Diagnosis Date  . CAD (coronary artery disease)    nuclear stress test-09/09/11 low risk scan EF61%  . Chronic headache   . Chronic neck pain   . Diabetes mellitus   . Hyperlipidemia   . Hypertension 12/26/08   ECHO- EF>55%  . Insomnia     Past Surgical History:  Procedure Laterality Date  . APPENDECTOMY    . cervical spine injection    . COLONOSCOPY  04-02-06    per [facility], clear, repeat in 10 yrs  . CORONARY ARTERY BYPASS GRAFT  2002   4 vessels    Allergies  Allergen Reactions  . Codeine Anaphylaxis    Pt. verbalized that he has taken and can take Hydrocodone, Oxycodone w/o issue - codeine allergy only  .  Ciprofloxacin Hives    REACTION: rash  . Crestor [Rosuvastatin]   . Isosorbide Nitrate Other (See Comments)    Headaches  . Lipitor [Atorvastatin]   . Zolpidem Tartrate     REACTION: HA's    Current Outpatient Prescriptions  Medication Sig Dispense Refill  . amLODipine (NORVASC) 5 MG tablet TAKE 1 TABLET BY MOUTH EVERY DAY 90 tablet 3  . Ascorbic Acid (VITAMIN C) 1000 MG tablet Take 1,000 mg by mouth daily.    Marland Kitchen aspirin 81 MG tablet Take 81 mg by mouth daily.     . cholecalciferol (VITAMIN D) 1000 UNITS tablet Take 1,000 Units by mouth 2 (two) times daily.      . clonazePAM (KLONOPIN) 0.5  MG tablet Take 1 tablet (0.5 mg total) by mouth 2 (two) times daily as needed for anxiety. 60 tablet 5  . fish oil-omega-3 fatty acids 1000 MG capsule Take 2 g by mouth daily.      Marland Kitchen glucose blood (ACCU-CHEK AVIVA PLUS) test strip TEST SUGAR ONCE A DAY and diagnosis code is E 11.9 100 each 1  . HYDROcodone-acetaminophen (NORCO) 10-325 MG tablet Take 1 tablet by mouth every 6 (six) hours as needed for severe pain. 120 tablet 0  . JANUVIA 100 MG tablet TAKE 1 TABLET BY MOUTH EVERY DAY 90 tablet 0  . lisinopril (PRINIVIL,ZESTRIL) 20 MG tablet TAKE 1 AND 1/2 TABLET BY MOUTH EVERY DAY 135 tablet 2  . meloxicam (MOBIC) 15 MG tablet Take 1 tablet (15 mg total) by mouth daily. 90 tablet 3  . metFORMIN (GLUCOPHAGE) 1000 MG tablet TAKE 1 TABLET BY MOUTH TWICE A DAY 60 tablet 0  . nebivolol (BYSTOLIC) 10 MG tablet Take 1 tablet (10 mg total) by mouth daily. 28 tablet 0  . Pitavastatin Calcium (LIVALO) 4 MG TABS Take 2 mg, half tablet, by mouth ONCE daily 90 tablet 3   No current facility-administered medications for this visit.     Social History   Social History  . Marital status: Married    Spouse name: N/A  . Number of children: N/A  . Years of education: N/A   Occupational History  . Not on file.   Social History Main Topics  . Smoking status: Former Smoker    Quit date: 11/26/1970  . Smokeless tobacco: Never Used  . Alcohol use No  . Drug use: No  . Sexual activity: Not on file   Other Topics Concern  . Not on file   Social History Narrative  . No narrative on file    Family History  Problem Relation Age of Onset  . Liver cancer Father   . Cancer Brother   . Cancer Brother   . Cancer Brother   . Coronary artery disease Unknown   . Hypertension Unknown   . Heart disease Mother   . Colon cancer Neg Hx    Socially he is married. He has 3 children 8 grandchildren and 4 great-grandchildren. There is no tobacco or alcohol use. He is now retired.   ROS General: Negative; No  fevers, chills, or night sweats;  HEENT: Decrease in hearing ,No changes in vision  sinus congestion, difficulty swallowing Pulmonary: Negative; No cough, wheezing, shortness of breath, hemoptysis Cardiovascular: See history of present illness GI: Negative; No nausea, vomiting, diarrhea, or abdominal pain GU: Negative; No dysuria, hematuria, or difficulty voiding Musculoskeletal: Negative; no myalgias, joint pain, or weakness Hematologic/Oncology: Negative; no easy bruising, bleeding Endocrine: Positive for diabetes mellitus Neuro: Positive for paresthesias  in his legs. Skin: Negative; No rashes or skin lesions Psychiatric: Negative; No behavioral problems, depression Sleep: Positive for restless legs; No snoring, daytime sleepiness, hypersomnolence, bruxism, hypnogognic hallucinations, no cataplexy Other comprehensive 14 point system review is negative.   PE BP 135/72   Pulse 60   Ht 5' 9.5" (1.765 m)   Wt 205 lb 6.4 oz (93.2 kg)   BMI 29.90 kg/m    Repeat blood pressure by me 122/70  Wt Readings from Last 3 Encounters:  05/05/17 205 lb 6.4 oz (93.2 kg)  04/28/17 209 lb (94.8 kg)  03/03/17 202 lb (91.6 kg)   General: Alert, oriented, no distress.  Skin: normal turgor, no rashes, warm and dry HEENT: Normocephalic, atraumatic. Pupils equal round and reactive to light; sclera anicteric; extraocular muscles intact;  Nose without nasal septal hypertrophy Mouth/Parynx benign; Mallinpatti scale 3 Neck: No JVD, no carotid bruits; normal carotid upstroke Lungs: clear to ausculatation and percussion; no wheezing or rales Chest wall: without tenderness to palpitation Heart: PMI not displaced, RRR, s1 s2 normal, 5-5/9 systolic murmur, no diastolic murmur, no rubs, gallops, thrills, or heaves Abdomen: soft, nontender; no hepatosplenomehaly, BS+; abdominal aorta nontender and not dilated by palpation. Back: no CVA tenderness Pulses 2+ Musculoskeletal: full range of motion, normal  strength, no joint deformities Extremities: no clubbing cyanosis or edema, Homan's sign negative  Neurologic: grossly nonfocal; Cranial nerves grossly wnl Psychologic: Normal mood and affect   ECG (independently read by me): Normal sinus rhythm at 60 bpm.  Nonspecific T wave abnormality.  Normal intervals.  No ectopy.  May 2018 ECG (independently read by me): Sinus bradycardia 59 bpm.  No significant ST-T changes.  Early transition.  April 2017 ECG (independently read by me): Sinus bradycardia at 53 bpm.  Q waves in III and F.  March 2016 ECG (independently read by me): Normal sinus rhythm at 67.  No ectopy.  November 2015 ECG (independently read by me): Normal sinus rhythm at 65 bpm.  Early transition.  No significant ST segment changes.  Normal intervals.  Prior ECG: Normal sinus rhythm at 58 beats per minute. QTc interval 428 ms. PR interval 170 ms. Nonspecific ST changes   LABS:  BMP Latest Ref Rng & Units 02/19/2017 11/05/2016 04/22/2016  Glucose 65 - 99 mg/dL 146(H) 143(H) 120(H)  BUN 7 - 25 mg/dL 14 11 9   Creatinine 0.70 - 1.25 mg/dL 0.87 0.85 0.95  Sodium 135 - 146 mmol/L 139 139 141  Potassium 3.5 - 5.3 mmol/L 4.3 4.3 4.2  Chloride 98 - 110 mmol/L 104 102 103  CO2 20 - 32 mmol/L 24 28 30   Calcium 8.6 - 10.3 mg/dL 9.3 9.1 9.4    Hepatic Function Latest Ref Rng & Units 02/19/2017 11/05/2016 04/22/2016  Total Protein 6.1 - 8.1 g/dL 6.7 6.9 7.0  Albumin 3.6 - 5.1 g/dL 4.1 4.1 4.3  AST 10 - 35 U/L 22 20 26   ALT 9 - 46 U/L 26 22 28   Alk Phosphatase 40 - 115 U/L 82 74 65  Total Bilirubin 0.2 - 1.2 mg/dL 0.4 0.4 0.4  Bilirubin, Direct 0.0 - 0.3 mg/dL - - 0.1    CBC Latest Ref Rng & Units 11/05/2016 04/22/2016 04/01/2015  WBC 3.8 - 10.8 K/uL 7.9 7.0 7.3  Hemoglobin 13.2 - 17.1 g/dL 14.5 14.4 14.6  Hematocrit 38.5 - 50.0 % 43.1 42.8 44.2  Platelets 140 - 400 K/uL 202 185.0 181.0   Lab Results  Component Value Date   MCV 90.9 11/05/2016  MCV 91.6 04/22/2016   MCV 92.6  04/01/2015    Lab Results  Component Value Date   TSH 2.97 11/05/2016   Lab Results  Component Value Date   HGBA1C 6.4 (H) 11/05/2016    BNP No results found for: PROBNP  Lipid Panel     Component Value Date/Time   CHOL 170 02/19/2017 0831   CHOL 173 05/24/2013 1049   TRIG 206 (H) 02/19/2017 0831   TRIG 185 (H) 05/24/2013 1049   HDL 35 (L) 02/19/2017 0831   HDL 39 (L) 05/24/2013 1049   CHOLHDL 4.9 02/19/2017 0831   VLDL 41 (H) 02/19/2017 0831   LDLCALC 94 02/19/2017 0831   LDLCALC 97 05/24/2013 1049     RADIOLOGY: No results found.  IMPRESSION:  1. Mixed hyperlipidemia   2. CAD in native artery   3. Hx of CABG   4. Essential hypertension   5. Atherosclerosis of native coronary artery of native heart without angina pectoris   6. Type 2 diabetes mellitus with complication, without long-term current use of insulin (HCC)   7. Hyperlipidemia LDL goal <70   8. Restless legs     ASSESSMENT AND PLAN: [name] is a 66 year old male who underwent CABG revascularization surgery by Dr. Roxan Hockey in November 2002.  Remotely he had noticed mild shortness of breath and fatigue never experienced classic substernal chest pressure.  At that time, I attempted to add low-dose isosorbide mononitrate, but unfortunately he developed a significant headache and stopped taking this.  A nuclear perfusion study continued to show normal perfusion but he did develop ST segment changes of 1-2 mm.  since amlodipine was added to his medical regimen he has felt improved.  His blood pressure today is stable on amlodipine 5 mg, lisinopril 20 mmol, and Bystolic 10 mg.  Remotely, he had stopped taking his simvastatin and was using some of his wife's Livalo.  I prescribed with low.  Therefore, at his last office visit.  However, he states that for the past month he has been taking this.  When I saw him in May 2018.  His LDL was 117.  In August 2018 while on low-dose Livalo LDL had reduced to 94.   At that time, triglycerides at further increased to 206.  He is not taking treatment for over one month.  I have recommended another attempt at trial of Livalo.  He will also take supplemental coenzyme Q 10 to see if this helps some of his arthralgia type symptoms.  He will reinitiate 2 mg daily and ultimately if he's able to tolerate this further titration of 4 mg will be done.  If he is still unable to take statin therapy, particularly with his CAD, he may be a candidate for PCSK9 inhibition with Repatha reticular since the cost has now been dramatically reduced, which will significantly reduce the cost of Medicare co-pays.  He will undergo repeat blood work in 3 months.  I will see him in 4 months for reevaluation.  Time spent: 25 minutes Troy Sine, MD, [facility]  05/07/2017 1:39 PM

## 2017-05-19 ENCOUNTER — Other Ambulatory Visit: Payer: Self-pay | Admitting: Family Medicine

## 2017-05-30 ENCOUNTER — Other Ambulatory Visit: Payer: Self-pay | Admitting: Family Medicine

## 2017-06-01 ENCOUNTER — Telehealth: Payer: Self-pay | Admitting: Cardiovascular Disease

## 2017-06-01 NOTE — Telephone Encounter (Signed)
NEw message  Patient calling the office for samples of medication:   1.  What medication and dosage are you requesting samples for? bystolic 10mg    2.  Are you currently out of this medication? yes

## 2017-06-01 NOTE — Telephone Encounter (Signed)
Medication samples have been provided to the patient.  Drug name: bystolic 10mg   Qty: 2 bottles  LOT: [id number]  Exp.Date: 03/20  Samples left at front desk for patient pick-up. Patient notified.  Julaine Fusilkins, Ashlynne Shetterly M 3:01 PM 06/01/2017

## 2017-06-06 ENCOUNTER — Other Ambulatory Visit: Payer: Self-pay | Admitting: Family Medicine

## 2017-06-07 ENCOUNTER — Other Ambulatory Visit: Payer: Self-pay | Admitting: Family Medicine

## 2017-07-12 ENCOUNTER — Telehealth: Payer: Self-pay | Admitting: Cardiovascular Disease

## 2017-07-12 MED ORDER — NEBIVOLOL HCL 10 MG PO TABS: 10.0000 mg | ORAL_TABLET | Freq: Every day | ORAL | 11 refills | Status: DC

## 2017-07-12 NOTE — Telephone Encounter (Signed)
New Message  Patient calling the office for samples of medication:   1.  What medication and dosage are you requesting samples for? bystolic 10mg   2.  Are you currently out of this medication? yes

## 2017-07-12 NOTE — Telephone Encounter (Signed)
Samples available.  Patient notified. Samples left at front desk for pickup.

## 2017-07-27 ENCOUNTER — Other Ambulatory Visit: Payer: Self-pay | Admitting: Cardiovascular Disease

## 2017-07-27 LAB — HEPATIC FUNCTION PANEL
AG Ratio: 1.7 (calc) (ref 1.0–2.5)
ALKALINE PHOSPHATASE (APISO): 69 U/L (ref 40–115)
ALT: 33 U/L (ref 9–46)
AST: 27 U/L (ref 10–35)
Albumin: 4.3 g/dL (ref 3.6–5.1)
BILIRUBIN INDIRECT: 0.4 mg/dL (ref 0.2–1.2)
BILIRUBIN TOTAL: 0.5 mg/dL (ref 0.2–1.2)
Bilirubin, Direct: 0.1 mg/dL (ref 0.0–0.2)
GLOBULIN: 2.6 g/dL (ref 1.9–3.7)
TOTAL PROTEIN: 6.9 g/dL (ref 6.1–8.1)

## 2017-07-27 LAB — LIPID PANEL
Cholesterol: 153 mg/dL (ref <200)
HDL: 43 mg/dL (ref >40)
LDL CHOLESTEROL (CALC): 92 mg/dL
Non-HDL Cholesterol (Calc): 110 mg/dL (calc) (ref <130)
TRIGLYCERIDES: 89 mg/dL (ref <150)
Total CHOL/HDL Ratio: 3.6 (calc) (ref <5.0)

## 2017-07-30 ENCOUNTER — Ambulatory Visit (INDEPENDENT_AMBULATORY_CARE_PROVIDER_SITE_OTHER): Payer: Medicare PPO | Admitting: Family Medicine

## 2017-07-30 ENCOUNTER — Encounter: Payer: Self-pay | Admitting: Family Medicine

## 2017-07-30 VITALS — BP 102/76 | HR 74 | Temp 98.0°F | Wt 209.8 lb

## 2017-07-30 DIAGNOSIS — M159 Polyosteoarthritis, unspecified: Secondary | ICD-10-CM

## 2017-07-30 DIAGNOSIS — F1190 Opioid use, unspecified, uncomplicated: Secondary | ICD-10-CM | POA: Diagnosis not present

## 2017-07-30 DIAGNOSIS — M150 Primary generalized (osteo)arthritis: Secondary | ICD-10-CM | POA: Diagnosis not present

## 2017-07-30 DIAGNOSIS — M8949 Other hypertrophic osteoarthropathy, multiple sites: Secondary | ICD-10-CM

## 2017-07-30 MED ORDER — SULFAMETHOXAZOLE-TRIMETHOPRIM 800-160 MG PO TABS: 1.0000 | ORAL_TABLET | Freq: Two times a day (BID) | ORAL | 2 refills | Status: DC

## 2017-07-30 MED ORDER — HYDROCODONE-ACETAMINOPHEN 10-325 MG PO TABS: 1.0000 | ORAL_TABLET | Freq: Four times a day (QID) | ORAL | 0 refills | Status: DC | PRN

## 2017-07-30 MED ORDER — CLONAZEPAM 0.5 MG PO TABS: 0.5000 mg | ORAL_TABLET | Freq: Two times a day (BID) | ORAL | 5 refills | Status: DC | PRN

## 2017-07-30 NOTE — Progress Notes (Signed)
   Subjective:    Patient ID: [name], male    DOB: 1950/10/08, 67 y.o.   MRN: [id number]  HPI Here for pain management. He has been doing well.  Indication for chronic opioid: osteoarthritis, especially the hips  Medication and dose: Norco 10-325  # pills per month: 120 Last UDS date: 07-30-17 Pain contract signed (Y/N): 07-30-17 Date narcotic database last reviewed (include red flags): 07-30-17    Review of Systems  Constitutional: Negative.   Respiratory: Negative.   Cardiovascular: Negative.   Musculoskeletal: Positive for arthralgias.       Objective:   Physical Exam  Constitutional: He appears well-developed and well-nourished.  Cardiovascular: Normal rate, regular rhythm, normal heart sounds and intact distal pulses.  Pulmonary/Chest: Effort normal and breath sounds normal. No respiratory distress. He has no wheezes. He has no rales.          Assessment & Plan:  Pain management. Meds were refilled.  Gershon CraneStephen Brees Hounshell, MD

## 2017-08-02 ENCOUNTER — Telehealth: Payer: Self-pay | Admitting: Family Medicine

## 2017-08-02 NOTE — Telephone Encounter (Signed)
Copied from CRM [id number]. Topic: Quick Communication - Rx Refill/Question >> Aug 02, 2017 10:32 AM [name], [name], NT wrote: Medication: metformin , Has the patient contacted their pharmacy? {yes  (Agent: If no, request that the patient contact the pharmacy for the refill.) [facility]erred [facility] (with phone number [facility]  [address] Agent: Please be advised that RX refills may take up to 3 business days. We ask that you follow-up with your pharmacy.

## 2017-08-02 NOTE — Telephone Encounter (Signed)
Patient should have RF- sent in 06/07/17 with 2 RF

## 2017-08-02 NOTE — Telephone Encounter (Signed)
Prior auth sent to [id number].

## 2017-08-02 NOTE — Telephone Encounter (Signed)
Call to pharmacy- Rx verified- it is there and they will fill for patient.

## 2017-08-02 NOTE — Telephone Encounter (Signed)
Copied from CRM [id number]. Topic: General - Other >> Aug 02, 2017 10:35 AM [name], Talajah Slimp L, NT wrote: Reason for CRM: Patients wife called and said the prescription for hydrocodone  needs a form filled out for insurance purpose to be filled please advise

## 2017-08-03 NOTE — Telephone Encounter (Signed)
Note in [email or web address] state the Rx was approved.  PA Case ID: [id number].  [facility] and left a detailed message with this information and informed the pts wife also.

## 2017-08-04 ENCOUNTER — Ambulatory Visit (INDEPENDENT_AMBULATORY_CARE_PROVIDER_SITE_OTHER): Payer: Medicare PPO | Admitting: Family Medicine

## 2017-08-04 ENCOUNTER — Encounter: Payer: Self-pay | Admitting: Family Medicine

## 2017-08-04 ENCOUNTER — Encounter: Payer: Self-pay | Admitting: Internal Medicine

## 2017-08-04 VITALS — BP 130/70 | HR 62 | Temp 98.1°F | Ht 69.0 in | Wt 209.2 lb

## 2017-08-04 DIAGNOSIS — E119 Type 2 diabetes mellitus without complications: Secondary | ICD-10-CM | POA: Insufficient documentation

## 2017-08-04 DIAGNOSIS — Z0000 Encounter for general adult medical examination without abnormal findings: Secondary | ICD-10-CM | POA: Diagnosis not present

## 2017-08-04 DIAGNOSIS — Z125 Encounter for screening for malignant neoplasm of prostate: Secondary | ICD-10-CM

## 2017-08-04 LAB — POC URINALSYSI DIPSTICK (AUTOMATED)
Leukocytes, UA: NEGATIVE
NITRITE UA: NEGATIVE
RBC UA: NEGATIVE
Spec Grav, UA: >=1.03 — AB (ref 1.010–1.025)
UROBILINOGEN UA: 0.2 U/dL
pH, UA: 6 (ref 5.0–8.0)

## 2017-08-04 LAB — PAIN MGMT, PROFILE 8 W/CONF, U
6 Acetylmorphine: NEGATIVE ng/mL (ref <10)
ALPHAHYDROXYALPRAZOLAM: NEGATIVE ng/mL (ref <25)
ALPHAHYDROXYTRIAZOLAM: NEGATIVE ng/mL (ref <50)
AMINOCLONAZEPAM: 349 ng/mL — AB (ref <25)
Alcohol Metabolites: NEGATIVE ng/mL (ref <500)
Alphahydroxymidazolam: NEGATIVE ng/mL (ref <50)
Amphetamines: NEGATIVE ng/mL (ref <500)
BENZODIAZEPINES: POSITIVE ng/mL — AB (ref <100)
BUPRENORPHINE, URINE: NEGATIVE ng/mL (ref <5)
CODEINE: NEGATIVE ng/mL (ref <50)
Cocaine Metabolite: NEGATIVE ng/mL (ref <150)
Creatinine: 226.4 mg/dL
HYDROCODONE: 2042 ng/mL — AB (ref <50)
Hydromorphone: 169 ng/mL — ABNORMAL HIGH (ref <50)
Hydroxyethylflurazepam: NEGATIVE ng/mL (ref <50)
Lorazepam: NEGATIVE ng/mL (ref <50)
MARIJUANA METABOLITE: NEGATIVE ng/mL (ref <20)
MDMA: NEGATIVE ng/mL (ref <500)
Morphine: NEGATIVE ng/mL (ref <50)
Nordiazepam: NEGATIVE ng/mL (ref <50)
Norhydrocodone: 5370 ng/mL — ABNORMAL HIGH (ref <50)
OXAZEPAM: NEGATIVE ng/mL (ref <50)
OXYCODONE: NEGATIVE ng/mL (ref <100)
Opiates: POSITIVE ng/mL — AB (ref <100)
Oxidant: NEGATIVE ug/mL (ref <200)
TEMAZEPAM: NEGATIVE ng/mL (ref <50)
pH: 5.86 (ref 4.5–9.0)

## 2017-08-04 LAB — HEPATIC FUNCTION PANEL
ALT: 30 U/L (ref 0–53)
AST: 24 U/L (ref 0–37)
Albumin: 4.3 g/dL (ref 3.5–5.2)
Alkaline Phosphatase: 78 U/L (ref 39–117)
Bilirubin, Direct: 0.1 mg/dL (ref 0.0–0.3)
Total Bilirubin: 0.4 mg/dL (ref 0.2–1.2)
Total Protein: 7.2 g/dL (ref 6.0–8.3)

## 2017-08-04 LAB — CBC WITH DIFFERENTIAL/PLATELET
Basophils Absolute: 0.1 10*3/uL (ref 0.0–0.1)
Basophils Relative: 0.7 % (ref 0.0–3.0)
EOS PCT: 1.6 % (ref 0.0–5.0)
Eosinophils Absolute: 0.1 10*3/uL (ref 0.0–0.7)
HCT: 42.9 % (ref 39.0–52.0)
Hemoglobin: 14.5 g/dL (ref 13.0–17.0)
Lymphocytes Relative: 26.2 % (ref 12.0–46.0)
Lymphs Abs: 1.9 10*3/uL (ref 0.7–4.0)
MCHC: 33.9 g/dL (ref 30.0–36.0)
MCV: 88.6 fl (ref 78.0–100.0)
MONOS PCT: 9 % (ref 3.0–12.0)
Monocytes Absolute: 0.7 10*3/uL (ref 0.1–1.0)
NEUTROS ABS: 4.6 10*3/uL (ref 1.4–7.7)
NEUTROS PCT: 62.5 % (ref 43.0–77.0)
PLATELETS: 191 10*3/uL (ref 150.0–400.0)
RBC: 4.85 Mil/uL (ref 4.22–5.81)
RDW: 13.8 % (ref 11.5–15.5)
WBC: 7.4 10*3/uL (ref 4.0–10.5)

## 2017-08-04 LAB — BASIC METABOLIC PANEL WITH GFR
BUN: 16 mg/dL (ref 6–23)
CO2: 29 meq/L (ref 19–32)
Calcium: 9.2 mg/dL (ref 8.4–10.5)
Chloride: 102 meq/L (ref 96–112)
Creatinine, Ser: 0.9 mg/dL (ref 0.40–1.50)
GFR: 89.23 mL/min
Glucose, Bld: 167 mg/dL — ABNORMAL HIGH (ref 70–99)
Potassium: 4 meq/L (ref 3.5–5.1)
Sodium: 140 meq/L (ref 135–145)

## 2017-08-04 LAB — LIPID PANEL
Cholesterol: 149 mg/dL (ref 0–200)
HDL: 37.2 mg/dL — ABNORMAL LOW
LDL Cholesterol: 82 mg/dL (ref 0–99)
NonHDL: 112.05
Total CHOL/HDL Ratio: 4
Triglycerides: 148 mg/dL (ref 0.0–149.0)
VLDL: 29.6 mg/dL (ref 0.0–40.0)

## 2017-08-04 LAB — PSA: PSA: 1.16 ng/mL (ref 0.10–4.00)

## 2017-08-04 LAB — TSH: TSH: 2.78 u[IU]/mL (ref 0.35–4.50)

## 2017-08-04 LAB — HEMOGLOBIN A1C: Hgb A1c MFr Bld: 8.5 % — ABNORMAL HIGH (ref 4.6–6.5)

## 2017-08-04 MED ORDER — LANCETS MISC: 3 refills | Status: DC

## 2017-08-04 NOTE — Progress Notes (Signed)
   Subjective:    Patient ID: [name], male    DOB: December 19, 1950, 67 y.o.   MRN: [id number]  HPI Here for a well exam. He feels well in general. He will see Dr. Tresa EndoKelly on 09-03-17. He is behind on colonoscopies so he will contact the GI office to set this up.    Review of Systems  Constitutional: Negative.   HENT: Negative.   Eyes: Negative.   Respiratory: Negative.   Cardiovascular: Negative.   Gastrointestinal: Negative.   Genitourinary: Negative.   Musculoskeletal: Negative.   Skin: Negative.   Neurological: Negative.   Psychiatric/Behavioral: Negative.        Objective:   Physical Exam  Constitutional: He is oriented to person, place, and time. He appears well-developed and well-nourished. No distress.  HENT:  Head: Normocephalic and atraumatic.  Right Ear: External ear normal.  Left Ear: External ear normal.  Nose: Nose normal.  Mouth/Throat: Oropharynx is clear and moist. No oropharyngeal exudate.  Eyes: Conjunctivae and EOM are normal. Pupils are equal, round, and reactive to light. Right eye exhibits no discharge. Left eye exhibits no discharge. No scleral icterus.  Neck: Neck supple. No JVD present. No tracheal deviation present. No thyromegaly present.  Cardiovascular: Normal rate, regular rhythm, normal heart sounds and intact distal pulses. Exam reveals no gallop and no friction rub.  No murmur heard. Pulmonary/Chest: Effort normal and breath sounds normal. No respiratory distress. He has no wheezes. He has no rales. He exhibits no tenderness.  Abdominal: Soft. Bowel sounds are normal. He exhibits no distension and no mass. There is no tenderness. There is no rebound and no guarding.  Genitourinary: Rectum normal, prostate normal and penis normal. Rectal exam shows guaiac negative stool. No penile tenderness.  Musculoskeletal: Normal range of motion. He exhibits no edema or tenderness.  Lymphadenopathy:    He has no cervical adenopathy.  Neurological: He is  alert and oriented to person, place, and time. He has normal reflexes. No cranial nerve deficit. He exhibits normal muscle tone. Coordination normal.  Skin: Skin is warm and dry. No rash noted. He is not diaphoretic. No erythema. No pallor.  Psychiatric: He has a normal mood and affect. His behavior is normal. Judgment and thought content normal.          Assessment & Plan:  Well exam. We discussed diet and exercise. Get fasting labs.  Gershon CraneStephen Fry, MD

## 2017-08-05 ENCOUNTER — Other Ambulatory Visit: Payer: Self-pay

## 2017-08-05 MED ORDER — METFORMIN HCL 1000 MG PO TABS: 1000.0000 mg | ORAL_TABLET | Freq: Two times a day (BID) | ORAL | 0 refills | Status: DC

## 2017-08-10 ENCOUNTER — Encounter: Payer: Self-pay | Admitting: *Deleted

## 2017-08-10 ENCOUNTER — Telehealth: Payer: Self-pay | Admitting: Family Medicine

## 2017-08-10 NOTE — Telephone Encounter (Signed)
Pt returned call and results given to him with recommendations. Pt voiced understanding and that he was working on his diet and starting an exercise program to help with his diabetes.

## 2017-08-10 NOTE — Telephone Encounter (Signed)
This encounter was created in error - please disregard.

## 2017-08-13 ENCOUNTER — Telehealth: Payer: Self-pay | Admitting: *Deleted

## 2017-08-13 NOTE — Telephone Encounter (Signed)
PA started in covermymeds for livalo

## 2017-08-27 ENCOUNTER — Other Ambulatory Visit: Payer: Self-pay | Admitting: Family Medicine

## 2017-08-27 NOTE — Telephone Encounter (Signed)
Last OV 08/04/2017   Last refilled 05/31/2017 disp 30 with no refills   Sent to PCP for approval

## 2017-08-27 NOTE — Telephone Encounter (Signed)
Copied from CRM [id number]. Topic: Quick Communication - Rx Refill/Question >> Aug 27, 2017 10:54 AM Rudi CocoLathan, Markeese Boyajian M, NT wrote: Medication: Januvia   Has the patient contacted their pharmacy? yes  (Agent: If no, request that the patient contact the pharmacy for the refill.)   Preferred Pharmacy (with phone number or street name): [id number], [place] - [address] [facility] [address]  [place] [address] Phone: [phone] Fax: [phone]     Agent: Please be advised that RX refills may take up to 3 business days. We ask that you follow-up with your pharmacy.

## 2017-09-03 ENCOUNTER — Ambulatory Visit (INDEPENDENT_AMBULATORY_CARE_PROVIDER_SITE_OTHER): Payer: Medicare PPO | Admitting: Cardiovascular Disease

## 2017-09-03 VITALS — BP 144/70 | HR 59 | Ht 69.0 in | Wt 206.6 lb

## 2017-09-03 DIAGNOSIS — I10 Essential (primary) hypertension: Secondary | ICD-10-CM

## 2017-09-03 DIAGNOSIS — I2510 Atherosclerotic heart disease of native coronary artery without angina pectoris: Secondary | ICD-10-CM

## 2017-09-03 DIAGNOSIS — E785 Hyperlipidemia, unspecified: Secondary | ICD-10-CM | POA: Diagnosis not present

## 2017-09-03 DIAGNOSIS — Z79899 Other long term (current) drug therapy: Secondary | ICD-10-CM | POA: Diagnosis not present

## 2017-09-03 DIAGNOSIS — E118 Type 2 diabetes mellitus with unspecified complications: Secondary | ICD-10-CM | POA: Diagnosis not present

## 2017-09-03 DIAGNOSIS — Z951 Presence of aortocoronary bypass graft: Secondary | ICD-10-CM | POA: Diagnosis not present

## 2017-09-03 MED ORDER — PITAVASTATIN CALCIUM 4 MG PO TABS: 4.0000 mg | ORAL_TABLET | Freq: Every day | ORAL | 3 refills | Status: DC

## 2017-09-03 NOTE — Patient Instructions (Signed)
Medication Instructions:  INCREASE Livalo to 4 mg daily  Labwork: Please return for FASTING labs in 3-4 months (CMET,Lipid, HmgA1C)-lab orders provided.  Follow-Up: 4-5 months with Dr. Tresa EndoKelly  Any Other Special Instructions Will Be Listed Below (If Applicable).     If you need a refill on your cardiac medications before your next appointment, please call your pharmacy.

## 2017-09-03 NOTE — Progress Notes (Signed)
Patient ID: [name], male   DOB: 1950-07-10, 67 y.o.   MRN: [id number]      Primary M.D.: Dr. Alysia Penna  HPI: [name] is a 67 y.o. male presents to the office today for a 4 month  follow up cardiology evaluation.   [name] has CAD and in June 2002 underwent CABG surgery by Dr. Roxan Hockey with a sequential LIMA graft to the second diagonal and LAD, and sequential radial graft to the first and third diagonal branches. An echo Doppler study in 2010 showed normal systolic function with mild MR and trace TR.  A  nuclear perfusion study in March 2013 showed normal perfusion and function without scar or ischemia.  Additional problems include hypertension, type 2 diabetes mellitus, and hyperlipidemia. Remotely, he had worked as a Garment/textile technologist minor and in the past was some question of possible black lung. 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  In November 2015 he complained of a slight change in symptomatology with exertional shortness of breath and some arm weakness.  Prior to bypass surgery, he had never experienced chest pain but demonstrated exertional shortness of breath and arm discomfort.  He admitted to fatigue.  He denied any major episodes of chest pressure.  A 2-D echo Doppler study on 06/14/2014 showed an ejection fraction of 55-60% with mild focal basal hypertrophy of the septum; grade 1 diastolic dysfunction.  There was evidence for mild mitral regurgitation and moderate left atrial dilatation.  A nuclear perfusion study on December 11,2015 was interpreted as low risk.  He developed 1-2 mm of horizontal ST segment depression and had normal scintigraphic myocardial perfusion images.  Ejection fraction was 57%.  There was normal LV function without wall motion abnormality.  He has a history of hyperlipidemia and remotely self increased his simvastatin from 40 mg to 80 mg and developed occasional myalgias.   He has been on Bystolic 10 mg and lisinopril 30 mg for his hypertension.  He is on metformin 1000 mg twice a day as well as DiaBeta 5 mg for his type 2 diabetes mellitus.  When I last saw him in October 2018, he had stopped taking Livalo for approximately one month.  He had been busy doing home projects and denied any recurrent chest pain.  During his last evaluation.  I had a lengthy discussion with him and recommended another attempt at a trial of Livalo.  I also added supplemental coenzyme Q 10.  Over this time period, he was able to reinitiate 2 mg daily and has been able to tolerate this without myalgias.  Subsequent blood work did show improvement in his lipid status on 08/04/2017 with a total cholesterol 149, LDL 82.  Triglycerides 148, and HDL 37.  He has continued to be on Januvia and metformin for his diabetes mellitus.  Hypertension.  He has remained on amlodipine 5 mg, lisinopril 30 mg, and Bystolic 10 mg.  He presents for reevaluation.   Past Medical History:  Diagnosis Date  . CAD (coronary artery disease)    nuclear stress test-09/09/11 low risk scan EF61%, sees Dr. Claiborne Billings   . Chronic headache   . Chronic neck pain   . Diabetes mellitus   . Hyperlipidemia   . Hypertension 12/26/08   ECHO- EF>55%  . Insomnia     Past Surgical History:  Procedure Laterality Date  . APPENDECTOMY    . cervical spine injection    . COLONOSCOPY  04-02-06    per  GI, clear, repeat in 10 yrs  . CORONARY ARTERY BYPASS GRAFT  2002   4 vessels    Allergies  Allergen Reactions  . Codeine Anaphylaxis    Pt. verbalized that he has taken and can take Hydrocodone, Oxycodone w/o issue - codeine allergy only  . Ciprofloxacin Hives    REACTION: rash  . Crestor [Rosuvastatin]   . Isosorbide Nitrate Other (See Comments)    Headaches  . Lipitor [Atorvastatin]   . Zolpidem Tartrate     REACTION: HA's    Current Outpatient Medications  Medication Sig Dispense Refill  . ACCU-CHEK AVIVA PLUS  test strip TEST SUGAR ONCE A DAY AND DIAGNOSIS CODE IS E 11.9 100 each 1  . amLODipine (NORVASC) 5 MG tablet TAKE 1 TABLET BY MOUTH EVERY DAY 90 tablet 3  . Ascorbic Acid (VITAMIN C) 1000 MG tablet Take 1,000 mg by mouth daily.    Marland Kitchen aspirin 81 MG tablet Take 81 mg by mouth daily.     . cholecalciferol (VITAMIN D) 1000 UNITS tablet Take 1,000 Units by mouth 2 (two) times daily.      . clonazePAM (KLONOPIN) 0.5 MG tablet Take 1 tablet (0.5 mg total) by mouth 2 (two) times daily as needed for anxiety. 60 tablet 5  . Coenzyme Q10 (COQ10) 100 MG CAPS Take 1 tablet by mouth daily.    . fish oil-omega-3 fatty acids 1000 MG capsule Take 2 g by mouth daily.      Marland Kitchen HYDROcodone-acetaminophen (NORCO) 10-325 MG tablet Take 1 tablet by mouth every 6 (six) hours as needed for severe pain. 120 tablet 0  . JANUVIA 100 MG tablet TAKE 1 TABLET BY MOUTH EVERY DAY 90 tablet 3  . Lancets MISC Fast click lancet accu check may substitute for whever pt has 100 each 3  . lisinopril (PRINIVIL,ZESTRIL) 20 MG tablet TAKE 1 AND 1/2 TABLET BY MOUTH EVERY DAY 135 tablet 2  . meloxicam (MOBIC) 15 MG tablet Take 1 tablet (15 mg total) by mouth daily. 90 tablet 3  . metFORMIN (GLUCOPHAGE) 1000 MG tablet Take 1 tablet (1,000 mg total) by mouth 2 (two) times daily. 180 tablet 0  . nebivolol (BYSTOLIC) 10 MG tablet Take 1 tablet (10 mg total) by mouth daily. 30 tablet 11  . Pitavastatin Calcium (LIVALO) 4 MG TABS Take 2 mg, half tablet, by mouth ONCE daily 90 tablet 3  . sulfamethoxazole-trimethoprim (BACTRIM DS,SEPTRA DS) 800-160 MG tablet Take 1 tablet by mouth 2 (two) times daily. 60 tablet 2   No current facility-administered medications for this visit.     Social History   Socioeconomic History  . Marital status: Married    Spouse name: Not on file  . Number of children: Not on file  . Years of education: Not on file  . Highest education level: Not on file  Social Needs  . Financial resource strain: Not on file  .  Food insecurity - worry: Not on file  . Food insecurity - inability: Not on file  . Transportation needs - medical: Not on file  . Transportation needs - non-medical: Not on file  Occupational History  . Not on file  Tobacco Use  . Smoking status: Former Smoker    Last attempt to quit: 11/26/1970    Years since quitting: 46.8  . Smokeless tobacco: Never Used  Substance and Sexual Activity  . Alcohol use: No    Alcohol/week: 0.0 oz  . Drug use: No  .  Sexual activity: Not on file  Other Topics Concern  . Not on file  Social History Narrative  . Not on file    Family History  Problem Relation Age of Onset  . Liver cancer Father   . Cancer Brother   . Cancer Brother   . Cancer Brother   . Coronary artery disease Unknown   . Hypertension Unknown   . Heart disease Mother   . Colon cancer Neg Hx    Socially he is married. He has 3 children 8 grandchildren and 4 great-grandchildren. There is no tobacco or alcohol use. He is now retired.   ROS General: Negative; No fevers, chills, or night sweats;  HEENT: Decrease in hearing ,No changes in vision  sinus congestion, difficulty swallowing Pulmonary: Negative; No cough, wheezing, shortness of breath, hemoptysis Cardiovascular: See history of present illness GI: Negative; No nausea, vomiting, diarrhea, or abdominal pain GU: Negative; No dysuria, hematuria, or difficulty voiding Musculoskeletal: Negative; no myalgias, joint pain, or weakness Hematologic/Oncology: Negative; no easy bruising, bleeding Endocrine: Positive for diabetes mellitus Neuro: Positive for paresthesias in his legs. Skin: Negative; No rashes or skin lesions Psychiatric: Negative; No behavioral problems, depression Sleep: Positive for restless legs; No snoring, daytime sleepiness, hypersomnolence, bruxism, hypnogognic hallucinations, no cataplexy Other comprehensive 14 point system review is negative.   PE BP (!) 144/70   Pulse (!) 59   Ht 5' 9"  (1.753 m)    Wt 206 lb 9.6 oz (93.7 kg)   BMI 30.51 kg/m    Repeat blood pressure by me was 128/72  Wt Readings from Last 3 Encounters:  09/03/17 206 lb 9.6 oz (93.7 kg)  08/04/17 209 lb 3.2 oz (94.9 kg)  07/30/17 209 lb 12.8 oz (95.2 kg)   General: Alert, oriented, no distress.  Skin: normal turgor, no rashes, warm and dry HEENT: Normocephalic, atraumatic. Pupils equal round and reactive to light; sclera anicteric; extraocular muscles intact;  Nose without nasal septal hypertrophy Mouth/Parynx benign; Mallinpatti scale 3 Neck: No JVD, no carotid bruits; normal carotid upstroke Lungs: clear to ausculatation and percussion; no wheezing or rales Chest wall: without tenderness to palpitation Heart: PMI not displaced, RRR, s1 s2 normal, 2-8/4 systolic murmur, no diastolic murmur, no rubs, gallops, thrills, or heaves Abdomen: soft, nontender; no hepatosplenomehaly, BS+; abdominal aorta nontender and not dilated by palpation. Back: no CVA tenderness Pulses 2+ Musculoskeletal: full range of motion, normal strength, no joint deformities Extremities: no clubbing cyanosis or edema, Homan's sign negative  Neurologic: grossly nonfocal; Cranial nerves grossly wnl Psychologic: Normal mood and affect   ECG (independently read by me): Sinus bradycardia 59 bpm.  Early transition.  Normal intervals.  October 2018 ECG (independently read by me): Normal sinus rhythm at 60 bpm.  Nonspecific T wave abnormality.  Normal intervals.  No ectopy.  May 2018 ECG (independently read by me): Sinus bradycardia 59 bpm.  No significant ST-T changes.  Early transition.  April 2017 ECG (independently read by me): Sinus bradycardia at 53 bpm.  Q waves in III and F.  March 2016 ECG (independently read by me): Normal sinus rhythm at 67.  No ectopy.  November 2015 ECG (independently read by me): Normal sinus rhythm at 65 bpm.  Early transition.  No significant ST segment changes.  Normal intervals.  Prior ECG: Normal sinus  rhythm at 58 beats per minute. QTc interval 428 ms. PR interval 170 ms. Nonspecific ST changes   LABS:  BMP Latest Ref Rng & Units 08/04/2017 02/19/2017 11/05/2016  Glucose  70 - 99 mg/dL 167(H) 146(H) 143(H)  BUN 6 - 23 mg/dL 16 14 11   Creatinine 0.40 - 1.50 mg/dL 0.90 0.87 0.85  Sodium 135 - 145 mEq/L 140 139 139  Potassium 3.5 - 5.1 mEq/L 4.0 4.3 4.3  Chloride 96 - 112 mEq/L 102 104 102  CO2 19 - 32 mEq/L 29 24 28   Calcium 8.4 - 10.5 mg/dL 9.2 9.3 9.1    Hepatic Function Latest Ref Rng & Units 08/04/2017 07/27/2017 02/19/2017  Total Protein 6.0 - 8.3 g/dL 7.2 6.9 6.7  Albumin 3.5 - 5.2 g/dL 4.3 - 4.1  AST 0 - 37 U/L 24 27 22   ALT 0 - 53 U/L 30 33 26  Alk Phosphatase 39 - 117 U/L 78 - 82  Total Bilirubin 0.2 - 1.2 mg/dL 0.4 0.5 0.4  Bilirubin, Direct 0.0 - 0.3 mg/dL 0.1 0.1 -    CBC Latest Ref Rng & Units 08/04/2017 11/05/2016 04/22/2016  WBC 4.0 - 10.5 K/uL 7.4 7.9 7.0  Hemoglobin 13.0 - 17.0 g/dL 14.5 14.5 14.4  Hematocrit 39.0 - 52.0 % 42.9 43.1 42.8  Platelets 150.0 - 400.0 K/uL 191.0 202 185.0   Lab Results  Component Value Date   MCV 88.6 08/04/2017   MCV 90.9 11/05/2016   MCV 91.6 04/22/2016    Lab Results  Component Value Date   TSH 2.78 08/04/2017   Lab Results  Component Value Date   HGBA1C 8.5 (H) 08/04/2017    BNP No results found for: PROBNP  Lipid Panel     Component Value Date/Time   CHOL 149 08/04/2017 1101   CHOL 173 05/24/2013 1049   TRIG 148.0 08/04/2017 1101   TRIG 185 (H) 05/24/2013 1049   HDL 37.20 (L) 08/04/2017 1101   HDL 39 (L) 05/24/2013 1049   CHOLHDL 4 08/04/2017 1101   VLDL 29.6 08/04/2017 1101   LDLCALC 82 08/04/2017 1101   LDLCALC 97 05/24/2013 1049     RADIOLOGY: No results found.  IMPRESSION:  No diagnosis found.  ASSESSMENT AND PLAN: [name] is a 67 year old male who underwent CABG revascularization surgery by Dr. Roxan Hockey in November 2002.  Remotely he had noticed mild shortness of breath and fatigue  and never experienced classic substernal chest pressure.  At that time, I attempted to add low-dose isosorbide mononitrate, but unfortunately he developed a significant headache and stopped taking this.  A nuclear perfusion study continued to show normal perfusion but he did develop ST segment changes of 1-2 mm.  Amlodipine was added to his medical regimen and since institution he has felt improved.  His blood pressure today is controlled on his multi drug regimen consisting of amlodipine 5 mg, lisinopril 30 mg, and Bystolic 10 mg.  He no longer has fatigue.  He continues to be active.  He now has reinstituted Livalo and is tolerating the 2 mg dose.  Lipid studies have improved.  I will now try to increase his dose to 4 mg.  Hopefully, he will achieve an LDL less than 70 and if not, Zetia will be added to his regimen.  He is diabetic, on Januvia and metformin.  He also is on 2000 g of omega-3 fatty acid.  He will be undergoing a future colonoscopy.  This year he will be celebrating his 49th anniversary and I congratulated him on this.  In 3 months.  We will repeat laboratory and at that time, we'll also recheck a hemoglobin A1c in addition to his chemistry and lipid studies.  I will see him in 4-5 months for reevaluation.  Time spent: 25 minutes   Troy Sine, MD, [facility]  09/03/2017 11:46 AM

## 2017-09-04 ENCOUNTER — Encounter: Payer: Self-pay | Admitting: Cardiovascular Disease

## 2017-09-13 ENCOUNTER — Telehealth: Payer: Self-pay | Admitting: Family Medicine

## 2017-09-13 NOTE — Telephone Encounter (Signed)
Copied from CRM [id number]. Topic: Quick Communication - Rx Refill/Question >> Sep 13, 2017 12:29 PM [name], [name] wrote: Medication: clonazePAM (KLONOPIN) 0.5 MG tablet   Has the patient contacted their pharmacy? Yes   (Agent: If no, request that the patient contact the pharmacy for the refill.)   Preferred Pharmacy (with phone number or street name):[id number], [place] - [address] [facility] [address]   Agent: Please be advised that RX refills may take up to 3 business days. We ask that you follow-up with your pharmacy.

## 2017-09-13 NOTE — Telephone Encounter (Signed)
Attempting to call pt regarding prescription refill for Clonazepam. Current prescription was written on 07/30/17 with 5 refills. Male answered the phone and call was ended before nurse could explain what the call was regarding.

## 2017-09-14 NOTE — Telephone Encounter (Signed)
Called and spoke with the pharmacist pt does have refills on file. They are getting Rx ready today for the pt.

## 2017-09-17 ENCOUNTER — Telehealth: Payer: Self-pay

## 2017-09-17 MED ORDER — OSELTAMIVIR PHOSPHATE 75 MG PO CAPS: 75.0000 mg | ORAL_CAPSULE | Freq: Two times a day (BID) | ORAL | 0 refills | Status: DC

## 2017-09-17 NOTE — Telephone Encounter (Signed)
Spoke with patient and he complained of nausea, fever, and drainage.  rx sent.

## 2017-09-17 NOTE — Telephone Encounter (Signed)
Dr. Clent RidgesFry is not in the office, [facility].    Copied from CRM [id number]. Topic: Quick Communication - See Telephone Encounter >> Sep 17, 2017  2:53 PM Herby AbrahamJohnson, Shiquita C wrote: CRM for notification. See Telephone encounter for: pt's spouse called in. 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 Spouse would like to know if PCP could sent in Tamiflu for pt. She said that he is not feeling good and is unable to come in to be seen.    09/17/17.

## 2017-09-17 NOTE — Telephone Encounter (Signed)
Would not treat empirically with Tamiflu unless he has any fever or other suggestive symptoms. If he has fever, headache, body aches, cough, etc. okay to start Tamiflu 75 mg twice daily for 5 days

## 2017-10-05 ENCOUNTER — Ambulatory Visit (AMBULATORY_SURGERY_CENTER): Payer: Self-pay | Admitting: *Deleted

## 2017-10-05 ENCOUNTER — Other Ambulatory Visit: Payer: Self-pay

## 2017-10-05 VITALS — Ht 69.0 in | Wt 208.0 lb

## 2017-10-05 DIAGNOSIS — Z1211 Encounter for screening for malignant neoplasm of colon: Secondary | ICD-10-CM

## 2017-10-05 NOTE — Progress Notes (Signed)
Patient denies any allergies to eggs or soy. Patient denies any problems with anesthesia/sedation. Patient denies any oxygen use at home. Patient denies taking any diet/weight loss medications or blood thinners. EMMI education declined by pt. Wife at side during PV today.

## 2017-10-19 ENCOUNTER — Encounter: Payer: BC Managed Care – PPO | Admitting: Internal Medicine

## 2017-10-21 ENCOUNTER — Telehealth: Payer: Self-pay | Admitting: Cardiovascular Disease

## 2017-10-21 NOTE — Telephone Encounter (Signed)
Patient calling the office for samples of medication:   1.  What medication and dosage are you requesting samples for? Bystolic  2.  Are you currently out of this medication?  Just a couple

## 2017-10-21 NOTE — Telephone Encounter (Signed)
Patient aware samples are at the front desk for pick up  

## 2017-10-25 ENCOUNTER — Ambulatory Visit (INDEPENDENT_AMBULATORY_CARE_PROVIDER_SITE_OTHER): Payer: Medicare PPO | Admitting: Family Medicine

## 2017-10-25 ENCOUNTER — Encounter: Payer: Self-pay | Admitting: Family Medicine

## 2017-10-25 VITALS — BP 108/54 | HR 56 | Temp 97.8°F | Ht 69.0 in | Wt 208.8 lb

## 2017-10-25 DIAGNOSIS — M25511 Pain in right shoulder: Secondary | ICD-10-CM

## 2017-10-25 MED ORDER — HYDROCODONE-ACETAMINOPHEN 10-325 MG PO TABS: 1.0000 | ORAL_TABLET | Freq: Four times a day (QID) | ORAL | 0 refills | Status: DC | PRN

## 2017-10-25 NOTE — Progress Notes (Signed)
   Subjective:    Patient ID: [name], male    DOB: [birth year], 67 y.o.   MRN: [id number]  HPI Here to check on an injury to the right shoulder which happened at home 4 weeks ago. While working in his garage he tripped over something on the ground and he fell directly on the shoulder on the concrete floor. He had a lot of pain and very restricted ROM at first. These have improved a bit since then. He is already on Norco for back pain so this helps.    Review of Systems  Constitutional: Negative.   Respiratory: Negative.   Cardiovascular: Negative.   Musculoskeletal: Positive for arthralgias.       Objective:   Physical Exam  Constitutional: He appears well-developed and well-nourished.  Cardiovascular: Normal rate, regular rhythm, normal heart sounds and intact distal pulses.  Pulmonary/Chest: Effort normal and breath sounds normal. No respiratory distress. He has no wheezes. He has no rales.  Musculoskeletal:  He is tender in the anterior right shoulder. ROM is limited by pain, and he cannot adduct past horizontal. Pulling down on the arm is very painful          Assessment & Plan:  Right shoulder injury, possible AC separation, possible rotator cuff tear. Refer to Orthopedics.  Gershon CraneStephen Fry, MD

## 2017-10-26 ENCOUNTER — Ambulatory Visit (INDEPENDENT_AMBULATORY_CARE_PROVIDER_SITE_OTHER): Payer: Medicare PPO | Admitting: Orthopedic Surgery

## 2017-10-26 ENCOUNTER — Ambulatory Visit (INDEPENDENT_AMBULATORY_CARE_PROVIDER_SITE_OTHER): Payer: Medicare PPO

## 2017-10-26 ENCOUNTER — Other Ambulatory Visit (INDEPENDENT_AMBULATORY_CARE_PROVIDER_SITE_OTHER): Payer: Self-pay

## 2017-10-26 ENCOUNTER — Encounter (INDEPENDENT_AMBULATORY_CARE_PROVIDER_SITE_OTHER): Payer: Self-pay | Admitting: Orthopedic Surgery

## 2017-10-26 DIAGNOSIS — S46011A Strain of muscle(s) and tendon(s) of the rotator cuff of right shoulder, initial encounter: Secondary | ICD-10-CM | POA: Diagnosis not present

## 2017-10-26 DIAGNOSIS — M25511 Pain in right shoulder: Secondary | ICD-10-CM | POA: Diagnosis not present

## 2017-10-26 DIAGNOSIS — G8929 Other chronic pain: Secondary | ICD-10-CM

## 2017-10-26 NOTE — Progress Notes (Signed)
Office Visit Note   Patient: [name]           Date of Birth: [birth year]           MRN: [id number] Visit Date: 10/26/2017 Requested by: Nelwyn SalisburyFry, Stephen A, MD [address] [place], [place] [address] PCP: Nelwyn SalisburyFry, Stephen A, MD  Subjective: Chief Complaint  Patient presents with  . Right Shoulder - Pain    HPI: Just is a patient with right shoulder pain.  Had a fall 4 weeks ago.  He has had a pretty constant ache and he cannot lift it much.  When he tries to do too much he states that the shoulder will "pop".  He is not sleeping well at night.  He denied any bruising at the time of his injury.  He is not working anymore but does enjoy doing things around the yard gardening.  He takes Norco for right hip arthritis.  He has had open heart surgery and cannot have an MRI scan because of sternal wire heating.              ROS: All systems reviewed are negative as they relate to the chief complaint within the history of present illness.  Patient denies  fevers or chills.   Assessment & Plan: Visit Diagnoses:  1. Acute pain of right shoulder     Plan: Impression is right shoulder pain possible subluxation of the biceps tendon in association with subscapularis and/or supraspinatus tearing.  He is got diminished strength on his dominant right hand side with subscap testing and supraspinatus testing compared to the left.  No real restriction of passive range of motion yet.  Did ultrasound today and it looks like there is at least partial thickness tearing of the supraspinatus if not full-thickness.  Subscap also looks different compared to the left-hand side.  Plan CT arthrogram left shoulder evaluate full-thickness rotator cuff tear follow-up after that study.  Biceps tendon subluxation may also be causing the popping in his shoulder.  Follow-Up Instructions: No follow-ups on file.   Orders:  Orders Placed This Encounter  Procedures  . XR Shoulder Right   No orders of the defined  types were placed in this encounter.     Procedures: No procedures performed   Clinical Data: No additional findings.  Objective: Vital Signs: There were no vitals taken for this visit.  Physical Exam:   Constitutional: Patient appears well-developed HEENT:  Head: Normocephalic Eyes:EOM are normal Neck: Normal range of motion Cardiovascular: Normal rate Pulmonary/chest: Effort normal Neurologic: Patient is alert Skin: Skin is warm Psychiatric: Patient has normal mood and affect    Ortho Exam: Orthopedic exam demonstrates good cervical spine range of motion.  5 out of 5 grip EPL FPL interosseous wrist flexion wrist extension bicep triceps and deltoid strength.  Does have weakness to supraspinatus and subscap testing on the right compared to the left.  No definite restriction of passive range of motion yet.  He describes having to use his left arm to help the right arm up when he is trying to do anything.  No definite paresthesias C5 T1 in the right arm or left arm.  Specialty Comments:  No specialty comments available.  Imaging: Xr Shoulder Right  Result Date: 10/26/2017 AP axillary and outlet view right shoulder reviewed.  Acromiohumeral distance normal.  Mild AC joint arthritis is present.  Enthesopathic changes noted at the greater tuberosity.  The sternal wires observed.  No fracture or dislocation seen.  PMFS History: Patient Active Problem List   Diagnosis Date Noted  . Type 2 diabetes mellitus without complication, without long-term current use of insulin (HCC) 08/04/2017  . Osteoarthritis 04/28/2017  . Insomnia 03/03/2017  . Diabetes mellitus with neuropathy (HCC) 11/01/2015  . Fatigue 05/28/2014  . ANXIETY STATE, UNSPECIFIED 06/09/2010  . NECK PAIN 09/03/2009  . LEG PAIN 07/16/2008  . HEADACHE 07/16/2008  . LOSS, HEARING NOS 05/03/2007  . Hyperlipidemia 04/04/2007  . Essential hypertension 04/04/2007  . Coronary atherosclerosis 04/04/2007   Past  Medical History:  Diagnosis Date  . CAD (coronary artery disease)    nuclear stress test-09/09/11 low risk scan EF61%, sees Dr. Tresa Endo   . Chronic headache   . Chronic neck pain   . Diabetes mellitus   . Hyperlipidemia   . Hypertension 12/26/08   ECHO- EF>55%  . Insomnia     Family History  Problem Relation Age of Onset  . Liver cancer Father   . Cancer Brother   . Cancer Brother   . Cancer Brother   . Coronary artery disease Unknown   . Hypertension Unknown   . Heart disease Mother   . Colon cancer Neg Hx   . Stomach cancer Neg Hx   . Esophageal cancer Neg Hx     Past Surgical History:  Procedure Laterality Date  . APPENDECTOMY    . cervical spine injection    . COLONOSCOPY  04-02-06    per [facility], clear, repeat in 10 yrs  . CORONARY ARTERY BYPASS GRAFT  2002   4 vessels   Social History   Occupational History  . Not on file  Tobacco Use  . Smoking status: Former Smoker    Last attempt to quit: 11/26/1970    Years since quitting: 46.9  . Smokeless tobacco: Never Used  Substance and Sexual Activity  . Alcohol use: No    Alcohol/week: 0.0 oz  . Drug use: No  . Sexual activity: Not on file

## 2017-10-29 ENCOUNTER — Other Ambulatory Visit: Payer: Self-pay | Admitting: Family Medicine

## 2017-11-01 ENCOUNTER — Encounter: Payer: Self-pay | Admitting: Family Medicine

## 2017-11-01 ENCOUNTER — Ambulatory Visit (INDEPENDENT_AMBULATORY_CARE_PROVIDER_SITE_OTHER): Payer: Medicare PPO | Admitting: Family Medicine

## 2017-11-01 VITALS — BP 140/62 | HR 69 | Temp 98.0°F | Ht 69.0 in | Wt 207.4 lb

## 2017-11-01 DIAGNOSIS — F1190 Opioid use, unspecified, uncomplicated: Secondary | ICD-10-CM

## 2017-11-01 DIAGNOSIS — M150 Primary generalized (osteo)arthritis: Secondary | ICD-10-CM | POA: Diagnosis not present

## 2017-11-01 DIAGNOSIS — M159 Polyosteoarthritis, unspecified: Secondary | ICD-10-CM

## 2017-11-01 DIAGNOSIS — M8949 Other hypertrophic osteoarthropathy, multiple sites: Secondary | ICD-10-CM

## 2017-11-01 MED ORDER — HYDROCODONE-ACETAMINOPHEN 10-325 MG PO TABS: 1.0000 | ORAL_TABLET | Freq: Four times a day (QID) | ORAL | 0 refills | Status: DC | PRN

## 2017-11-01 MED ORDER — GABAPENTIN 300 MG PO CAPS: 900.0000 mg | ORAL_CAPSULE | Freq: Every day | ORAL | 5 refills | Status: DC

## 2017-11-01 MED ORDER — HYDROCODONE-ACETAMINOPHEN 10-325 MG PO TABS: 1.0000 | ORAL_TABLET | Freq: Four times a day (QID) | ORAL | 0 refills | Status: AC | PRN

## 2017-11-01 NOTE — Telephone Encounter (Signed)
FILLED ON 11/01/17 FOR 6 MONTHS.  DUPLICATE MESSAGE.  MESSAGE SENT TO THE PHARMACY.

## 2017-11-01 NOTE — Progress Notes (Signed)
   Subjective:    Patient ID: [name], male    DOB: 13-Aug-1950, 67 y.o.   MRN: [id number]  HPI Here for pain management. He is doing well as far as that goes. However his neuropathy has been bothering him more at night. The burning in his feet often makes it hard to sleep.  Indication for chronic opioid: osteoarthritis Medication and dose: Norco 10-325 # pills per month: 120 Last UDS date: 07-30-17 Opioid Treatment Agreement signed (Y/N): 11-01-17 Opioid Treatment Agreement last reviewed with patient:  11-01-17 NCCSRS reviewed this encounter (include red flags):  11-01-17     Review of Systems  Constitutional: Negative.   Respiratory: Negative.   Cardiovascular: Negative.   Musculoskeletal: Positive for arthralgias.  Neurological: Negative.        Objective:   Physical Exam  Constitutional: He is oriented to person, place, and time. He appears well-developed and well-nourished.  Cardiovascular: Normal rate, regular rhythm, normal heart sounds and intact distal pulses.  Pulmonary/Chest: Effort normal and breath sounds normal.  Neurological: He is alert and oriented to person, place, and time.          Assessment & Plan:  Pain management. The Norco was refilled. For the neuropathy we will increase Gabapentin to 3 capsules at bedtime.  Gershon Crane, MD

## 2017-11-15 ENCOUNTER — Inpatient Hospital Stay: Admission: RE | Admit: 2017-11-15 | Payer: Medicare PPO | Source: Ambulatory Visit

## 2017-11-15 ENCOUNTER — Telehealth (INDEPENDENT_AMBULATORY_CARE_PROVIDER_SITE_OTHER): Payer: Self-pay | Admitting: Orthopedic Surgery

## 2017-11-15 NOTE — Telephone Encounter (Signed)
Patient's wife called this morning stating that the CT for today was cancelled on Friday and that GI stated that would call her back to r/s.  She would like to know what is going on with the order and why do they keep cancelling the appointment.  The authorization expires on 11/26/17.  CB#[id number].  Thank you.

## 2017-11-15 NOTE — Telephone Encounter (Signed)
IC pt wife back and phone states number out to order. I went into pt insurance co and changed his Procedure code for authorization which was approve and then I contacted Victorino Dike with GI and advised them of the change in authoriztion and order.

## 2017-11-17 ENCOUNTER — Ambulatory Visit (INDEPENDENT_AMBULATORY_CARE_PROVIDER_SITE_OTHER): Payer: BC Managed Care – PPO | Admitting: Orthopedic Surgery

## 2017-11-22 ENCOUNTER — Other Ambulatory Visit: Payer: Self-pay | Admitting: Cardiovascular Disease

## 2017-11-22 DIAGNOSIS — I2510 Atherosclerotic heart disease of native coronary artery without angina pectoris: Secondary | ICD-10-CM | POA: Diagnosis not present

## 2017-11-22 DIAGNOSIS — Z79899 Other long term (current) drug therapy: Secondary | ICD-10-CM | POA: Diagnosis not present

## 2017-11-22 DIAGNOSIS — E118 Type 2 diabetes mellitus with unspecified complications: Secondary | ICD-10-CM | POA: Diagnosis not present

## 2017-11-22 DIAGNOSIS — Z951 Presence of aortocoronary bypass graft: Secondary | ICD-10-CM | POA: Diagnosis not present

## 2017-11-22 DIAGNOSIS — E785 Hyperlipidemia, unspecified: Secondary | ICD-10-CM | POA: Diagnosis not present

## 2017-11-23 LAB — COMPLETE METABOLIC PANEL WITH GFR
AG Ratio: 1.4 (calc) (ref 1.0–2.5)
ALBUMIN MSPROF: 4.1 g/dL (ref 3.6–5.1)
ALT: 27 U/L (ref 9–46)
AST: 22 U/L (ref 10–35)
Alkaline phosphatase (APISO): 75 U/L (ref 40–115)
BUN: 15 mg/dL (ref 7–25)
CALCIUM: 9.3 mg/dL (ref 8.6–10.3)
CO2: 30 mmol/L (ref 20–32)
Chloride: 105 mmol/L (ref 98–110)
Creat: 0.84 mg/dL (ref 0.70–1.25)
GFR, Est African American: 104 mL/min/{1.73_m2} (ref >=60)
GFR, Est Non African American: 90 mL/min/{1.73_m2} (ref >=60)
GLOBULIN: 2.9 g/dL (ref 1.9–3.7)
Glucose, Bld: 180 mg/dL — ABNORMAL HIGH (ref 65–99)
Potassium: 4.4 mmol/L (ref 3.5–5.3)
SODIUM: 141 mmol/L (ref 135–146)
TOTAL PROTEIN: 7 g/dL (ref 6.1–8.1)
Total Bilirubin: 0.4 mg/dL (ref 0.2–1.2)

## 2017-11-23 LAB — LIPID PANEL
CHOLESTEROL: 147 mg/dL (ref <200)
HDL: 43 mg/dL (ref >40)
LDL Cholesterol (Calc): 84 mg/dL (calc)
NON-HDL CHOLESTEROL (CALC): 104 mg/dL (ref <130)
Total CHOL/HDL Ratio: 3.4 (calc) (ref <5.0)
Triglycerides: 106 mg/dL (ref <150)

## 2017-11-23 LAB — HEMOGLOBIN A1C W/OUT EAG: Hgb A1c MFr Bld: 7.4 % of total Hgb — ABNORMAL HIGH (ref <5.7)

## 2017-11-25 ENCOUNTER — Ambulatory Visit
Admission: RE | Admit: 2017-11-25 | Discharge: 2017-11-25 | Disposition: A | Discharge status: Home / Self Care | Payer: Medicare PPO | Source: Ambulatory Visit | Attending: Orthopedic Surgery | Admitting: Orthopedic Surgery

## 2017-11-25 DIAGNOSIS — G8929 Other chronic pain: Secondary | ICD-10-CM

## 2017-11-25 DIAGNOSIS — S46011A Strain of muscle(s) and tendon(s) of the rotator cuff of right shoulder, initial encounter: Secondary | ICD-10-CM | POA: Diagnosis not present

## 2017-11-25 DIAGNOSIS — M25511 Pain in right shoulder: Principal | ICD-10-CM

## 2017-11-25 MED ORDER — IOPAMIDOL (ISOVUE-M 200) INJECTION 41%
15.0000 mL | Freq: Once | INTRAMUSCULAR | Status: AC
  Administered 2017-11-25: 15 mL via INTRA_ARTICULAR

## 2017-11-28 ENCOUNTER — Other Ambulatory Visit: Payer: Self-pay | Admitting: Family Medicine

## 2017-11-29 ENCOUNTER — Other Ambulatory Visit: Payer: Self-pay | Admitting: Cardiovascular Disease

## 2017-11-30 NOTE — Telephone Encounter (Signed)
Rx sent to pharmacy   

## 2017-12-02 ENCOUNTER — Ambulatory Visit (INDEPENDENT_AMBULATORY_CARE_PROVIDER_SITE_OTHER): Payer: Medicare PPO | Admitting: Orthopedic Surgery

## 2017-12-02 ENCOUNTER — Encounter (INDEPENDENT_AMBULATORY_CARE_PROVIDER_SITE_OTHER): Payer: Self-pay | Admitting: Orthopedic Surgery

## 2017-12-02 DIAGNOSIS — M75121 Complete rotator cuff tear or rupture of right shoulder, not specified as traumatic: Secondary | ICD-10-CM

## 2017-12-04 ENCOUNTER — Encounter (INDEPENDENT_AMBULATORY_CARE_PROVIDER_SITE_OTHER): Payer: Self-pay | Admitting: Orthopedic Surgery

## 2017-12-04 NOTE — Progress Notes (Signed)
Office Visit Note   Patient: [name]           Date of Birth: 1950/12/05           MRN: [id number] Visit Date: 12/02/2017 Requested by: Nelwyn SalisburyFry, Stephen A, MD [address] [place], [place] [address] PCP: Nelwyn SalisburyFry, Stephen A, MD  Subjective: Chief Complaint  Patient presents with  . Right Shoulder - Follow-up    HPI: Thomas NixonJanice is a patient with right shoulder pain.  Since I seen him he has had a CT arthrogram of the right shoulder.  This shows a rotator cuff tear of the supraspinatus.  Retracted several centimeters.  He describes some popping in the shoulder at times but overall he is very functional with the right shoulder.  He is retired but he likes to work in the yard and on cars.  He did fall in the garage last year and had some pain at that time.  He does play the guitar at church.              ROS: All systems reviewed are negative as they relate to the chief complaint within the history of present illness.  Patient denies  fevers or chills.   Assessment & Plan: Visit Diagnoses:  1. Nontraumatic complete tear of right rotator cuff     Plan: Impression is right shoulder rotator cuff tear following Arash fall last year.  He is pretty functional with his shoulder.  I think the tear is still repairable at this time but may not remain repairable if his retraction increases.  For now I think he needs to be careful with lifting away from his body.  Check him again in 6 months and will likely do ultrasound examination at that time.  Because of his functionality and general lack of pain we will hold off on surgical intervention for now.  Did use diagrams to show how much that rotator cuff was torn and how we will likely not repair itself.  Follow-Up Instructions: Return in about 6 months (around 06/04/2018).   Orders:  No orders of the defined types were placed in this encounter.  No orders of the defined types were placed in this encounter.     Procedures: No procedures  performed   Clinical Data: No additional findings.  Objective: Vital Signs: There were no vitals taken for this visit.  Physical Exam:   Constitutional: Patient appears well-developed HEENT:  Head: Normocephalic Eyes:EOM are normal Neck: Normal range of motion Cardiovascular: Normal rate Pulmonary/chest: Effort normal Neurologic: Patient is alert Skin: Skin is warm Psychiatric: Patient has normal mood and affect    Ortho Exam: Orthopedic exam demonstrates full active and passive range of motion of the left shoulder.  On the right the range of motion is also good with no restriction of external rotation of 15 degrees of abduction.  No other masses lymphadenopathy or skin changes noted in that shoulder girdle region.  A little bit of crepitus with internal and external rotation at 90 degrees of abduction.  Specialty Comments:  No specialty comments available.  Imaging: No results found.   PMFS History: Patient Active Problem List   Diagnosis Date Noted  . Type 2 diabetes mellitus without complication, without long-term current use of insulin (HCC) 08/04/2017  . Osteoarthritis 04/28/2017  . Insomnia 03/03/2017  . Diabetes mellitus with neuropathy (HCC) 11/01/2015  . Fatigue 05/28/2014  . ANXIETY STATE, UNSPECIFIED 06/09/2010  . NECK PAIN 09/03/2009  . LEG PAIN 07/16/2008  .  HEADACHE 07/16/2008  . LOSS, HEARING NOS 05/03/2007  . Hyperlipidemia 04/04/2007  . Essential hypertension 04/04/2007  . Coronary atherosclerosis 04/04/2007   Past Medical History:  Diagnosis Date  . CAD (coronary artery disease)    nuclear stress test-09/09/11 low risk scan EF61%, sees Dr. Tresa Endo   . Chronic headache   . Chronic neck pain   . Diabetes mellitus   . Hyperlipidemia   . Hypertension 12/26/08   ECHO- EF>55%  . Insomnia     Family History  Problem Relation Age of Onset  . Liver cancer Father   . Cancer Brother   . Cancer Brother   . Cancer Brother   . Coronary artery  disease Unknown   . Hypertension Unknown   . Heart disease Mother   . Colon cancer Neg Hx   . Stomach cancer Neg Hx   . Esophageal cancer Neg Hx     Past Surgical History:  Procedure Laterality Date  . APPENDECTOMY    . cervical spine injection    . COLONOSCOPY  04-02-06    per [facility], clear, repeat in 10 yrs  . CORONARY ARTERY BYPASS GRAFT  2002   4 vessels   Social History   Occupational History  . Not on file  Tobacco Use  . Smoking status: Former Smoker    Last attempt to quit: 11/26/1970    Years since quitting: 47.0  . Smokeless tobacco: Never Used  Substance and Sexual Activity  . Alcohol use: No    Alcohol/week: 0.0 oz  . Drug use: No  . Sexual activity: Not on file

## 2017-12-21 ENCOUNTER — Other Ambulatory Visit: Payer: Self-pay | Admitting: Family Medicine

## 2018-01-10 ENCOUNTER — Telehealth: Payer: Self-pay | Admitting: Family Medicine

## 2018-01-10 NOTE — Telephone Encounter (Signed)
Please advise 

## 2018-01-10 NOTE — Telephone Encounter (Signed)
Copied from CRM [phone]. Topic: Inquiry >> Jan 10, 2018  8:37 AM [name], [name] wrote: Reason for CRM: Pt wife called stating she was in last week with an URI. Now [name] has head and chest congestion, cough, sore throat, no fever. She is asking if medication can be sent in for him without coming in.   [id number] [place], [place] [facility] [address] [facility] [address] [phone] (Phone) [phone] (Fax)

## 2018-01-11 MED ORDER — AZITHROMYCIN 250 MG PO TABS: ORAL_TABLET | ORAL | 0 refills | Status: DC

## 2018-01-11 NOTE — Telephone Encounter (Signed)
Rx has been sent. Called and spoke with pt. Pt advised and voiced understanding.  

## 2018-01-11 NOTE — Telephone Encounter (Signed)
Cal him in a Zpack

## 2018-01-12 ENCOUNTER — Ambulatory Visit: Payer: Medicare PPO | Admitting: Physician Assistant

## 2018-01-12 ENCOUNTER — Encounter: Payer: Self-pay | Admitting: Family Medicine

## 2018-01-12 DIAGNOSIS — E119 Type 2 diabetes mellitus without complications: Secondary | ICD-10-CM | POA: Diagnosis not present

## 2018-01-12 LAB — HM DIABETES EYE EXAM

## 2018-01-19 ENCOUNTER — Encounter (INDEPENDENT_AMBULATORY_CARE_PROVIDER_SITE_OTHER): Payer: Self-pay | Admitting: Orthopedic Surgery

## 2018-01-19 ENCOUNTER — Ambulatory Visit (INDEPENDENT_AMBULATORY_CARE_PROVIDER_SITE_OTHER): Payer: Medicare PPO | Admitting: Orthopedic Surgery

## 2018-01-19 DIAGNOSIS — S46011D Strain of muscle(s) and tendon(s) of the rotator cuff of right shoulder, subsequent encounter: Secondary | ICD-10-CM

## 2018-01-20 ENCOUNTER — Telehealth: Payer: Self-pay | Admitting: Cardiovascular Disease

## 2018-01-20 NOTE — Telephone Encounter (Signed)
    [facility] Pre-operative Risk Assessment    Request for surgical clearance:  1. What type of surgery is being performed? Right shoulder arthroscopy, biceps tenodesis, mini open rotator cuff repair   2. When is this surgery scheduled? TBD   3. What type of clearance is required (medical clearance vs. Pharmacy clearance to hold med vs. Both)? Both   4. Are there any medications that need to be held prior to surgery and how long? ASA   5. Practice name and name of physician performing surgery?  Dr. Alphonzo Severance @ [facility]   6. What is your office phone number [phone]     7.   What is your office fax number [phone]  8.   Anesthesia type (None, local, MAC, general) ?  Not specified    Fidel Levy 01/20/2018, 2:47 PM  _________________________________________________________________   (provider comments below)

## 2018-01-23 ENCOUNTER — Encounter (INDEPENDENT_AMBULATORY_CARE_PROVIDER_SITE_OTHER): Payer: Self-pay | Admitting: Orthopedic Surgery

## 2018-01-23 NOTE — Progress Notes (Addendum)
Office Visit Note   Patient: [name]           Date of Birth: 1950-09-10           MRN: [id number] Visit Date: 01/19/2018 Requested by: Nelwyn SalisburyFry, Stephen A, MD [address] [place], [place] [address] PCP: Nelwyn SalisburyFry, Stephen A, MD  Subjective: Chief Complaint  Patient presents with  . Right Shoulder - Follow-up    HPI: Patient presents for evaluation of right shoulder pain.  Since I have seen him he has had an ct scan which shows a 1.4 cm retracted rotator cuff tear.  He has had an injection.  The pain radiates down his arm.  It is hard for him to sleep with his hand over his head.  He does have a history of coronary bypass grafting and diabetes.  He saw his cardiologist in March with good results.  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  Patient denies  fevers or chills. Impression is right shoulder rotator cuff tear.  Plan is arthroscopy with likely biceps tendon release and  Assessment & Plan: Visit Diagnoses:  1. Traumatic complete tear of right rotator cuff, subsequent encounter     Plan: Rotator cuff repair.  Biceps tenodesis also indicated.  Risk and benefits as well as recovery discussed.  All questions answered.  Plan to use CPM machine or passive motion brace as part of the rehab.  Follow-Up Instructions: No follow-ups on file.   Orders:  No orders of the defined types were placed in this encounter.  No orders of the defined types were placed in this encounter.     Procedures: No procedures performed   Clinical Data: No additional findings.  Objective: Vital Signs: There were no vitals taken for this visit.  Physical Exam:   Constitutional: Patient appears well-developed HEENT:  Head: Normocephalic Eyes:EOM are normal Neck: Normal range of motion Cardiovascular: Normal  rate Pulmonary/chest: Effort normal Neurologic: Patient is alert Skin: Skin is warm Psychiatric: Patient has normal mood and affect    Ortho Exam: On the right-hand side is got good strength but it is a little bit less with supraspinatus and infraspinatus testing compared to the left-hand side.  No AC joint tenderness to direct palpation.  No other masses lymphadenopathy or skin changes noted in the right shoulder girdle region.  Ho exam demonstrates full active and passive range of motion of the left shoulder.  Specialty Comments:  No specialty comments available.  Imaging: No results found.   PMFS History: Patient Active Problem List   Diagnosis Date Noted  . Type 2 diabetes mellitus without complication, without long-term current use of insulin (HCC) 08/04/2017  . Osteoarthritis 04/28/2017  . Insomnia 03/03/2017  . Diabetes mellitus with neuropathy (HCC) 11/01/2015  . Fatigue 05/28/2014  . ANXIETY STATE, UNSPECIFIED 06/09/2010  . NECK PAIN 09/03/2009  . LEG PAIN 07/16/2008  . HEADACHE 07/16/2008  . LOSS, HEARING NOS 05/03/2007  . Hyperlipidemia 04/04/2007  . Essential hypertension 04/04/2007  . Coronary atherosclerosis 04/04/2007   Past Medical History:  Diagnosis Date  . CAD (coronary artery disease)    nuclear stress test-09/09/11 low risk scan EF61%, sees Dr. Tresa EndoKelly   . Chronic headache   . Chronic neck pain   .  Diabetes mellitus   . Hyperlipidemia   . Hypertension 12/26/08   ECHO- EF>55%  . Insomnia     Family History  Problem Relation Age of Onset  . Liver cancer Father   . Cancer Brother   . Cancer Brother   . Cancer Brother   . Coronary artery disease Unknown   . Hypertension Unknown   . Heart disease Mother   . Colon cancer Neg Hx   . Stomach cancer Neg Hx   . Esophageal cancer Neg Hx     Past Surgical History:  Procedure Laterality Date  . APPENDECTOMY    . cervical spine injection    . COLONOSCOPY  04-02-06    per [facility], clear, repeat in  10 yrs  . CORONARY ARTERY BYPASS GRAFT  2002   4 vessels   Social History   Occupational History  . Not on file  Tobacco Use  . Smoking status: Former Smoker    Last attempt to quit: 11/26/1970    Years since quitting: 47.1  . Smokeless tobacco: Never Used  Substance and Sexual Activity  . Alcohol use: No    Alcohol/week: 0.0 oz  . Drug use: No  . Sexual activity: Not on file

## 2018-01-25 NOTE — Telephone Encounter (Signed)
   Primary Cardiologist: Nicki Guadalajarahomas Kelly, MD   Call back staff: I will fax a letter to the requesting surgeon.  Please call to make sure that this was received. This phone will be removed from the preop pool.  Chart reviewed as part of pre-operative protocol coverage.  He has a history of CAD status post CABG in 2002, hypertension, hyperlipidemia, diabetes.  Nuclear stress test in 2015 was negative for ischemia.  Echocardiogram 2015 demonstrated normal LV function.   Patient was contacted 01/25/2018 in reference to pre-operative risk assessment for pending surgery as outlined below.  [name] was last seen on 09/03/2017 by Dr. Tresa EndoKelly.  Since that day, [name] has done well without chest discomfort or shortness of breath.  He has no limitations to activity, aside from his right shoulder.  RCRI: 0.9  Therefore, based on ACC/AHA guidelines, the patient would be at acceptable risk for the planned procedure without further cardiovascular testing.  Ideally, he should remain on aspirin without interruption.  However, if his risk for bleeding is too great, this can be held for 5 to 7 days prior to procedure and resumed afterwards and felt to be safe.  Tereso NewcomerScott Nyriah Coote, PA-C 01/25/2018, 3:53 PM

## 2018-01-26 NOTE — Telephone Encounter (Signed)
I called Dr. Lorin PicketScott Dean's office to confirm if clearance letter was recv'd yesterday. If not please call [phone] so that we may re send if needed. I will removed this from the call back pool.

## 2018-01-31 ENCOUNTER — Telehealth: Payer: Self-pay | Admitting: Family Medicine

## 2018-01-31 NOTE — Telephone Encounter (Signed)
Pt is due for a pain management office visit for more refills   Call pt has an appt scheduled for Friday

## 2018-01-31 NOTE — Telephone Encounter (Signed)
Copied from CRM [id number]. Topic: Quick Communication - Rx Refill/Question >> Jan 31, 2018 10:45 AM [name], Rosey Batheresa D wrote: Medication: HYDROcodone-acetaminophen (NORCO) 10-325 MG tablet  Has the patient contacted their pharmacy?Yes (Agent: If no, request that the patient contact the pharmacy for the refill.) (Agent: If yes, when and what did the pharmacy advise?)  Preferred Pharmacy (with phone number or street name): [id number] [place], [place] - [address]  Agent: Please be advised that RX refills may take up to 3 business days. We ask that you follow-up with your pharmacy.

## 2018-02-04 ENCOUNTER — Ambulatory Visit (INDEPENDENT_AMBULATORY_CARE_PROVIDER_SITE_OTHER): Payer: Medicare PPO | Admitting: Family Medicine

## 2018-02-04 ENCOUNTER — Encounter: Payer: Self-pay | Admitting: Family Medicine

## 2018-02-04 VITALS — BP 140/78 | HR 65 | Temp 98.3°F | Ht 69.0 in | Wt 201.0 lb

## 2018-02-04 DIAGNOSIS — F1190 Opioid use, unspecified, uncomplicated: Secondary | ICD-10-CM

## 2018-02-04 DIAGNOSIS — M8949 Other hypertrophic osteoarthropathy, multiple sites: Secondary | ICD-10-CM

## 2018-02-04 DIAGNOSIS — M150 Primary generalized (osteo)arthritis: Secondary | ICD-10-CM | POA: Diagnosis not present

## 2018-02-04 DIAGNOSIS — M159 Polyosteoarthritis, unspecified: Secondary | ICD-10-CM

## 2018-02-04 MED ORDER — HYDROCODONE-ACETAMINOPHEN 10-325 MG PO TABS: 1.0000 | ORAL_TABLET | Freq: Four times a day (QID) | ORAL | 0 refills | Status: AC | PRN

## 2018-02-04 MED ORDER — HYDROCODONE-ACETAMINOPHEN 10-325 MG PO TABS: 1.0000 | ORAL_TABLET | Freq: Four times a day (QID) | ORAL | 0 refills | Status: DC | PRN

## 2018-02-04 NOTE — Progress Notes (Signed)
   Subjective:    Patient ID: Ala Bentenford H Kobrin, male    DOB: 03/09/1951, 67 y.o.   MRN: [id number]  HPI Here for pain management. He is doing well except for right shoulder pain. He has a torn rotator cuff, and he is scheduled for a surgical repair per Dr. Dorene GrebeScott Dean on 02-28-18.  Indication for chronic opioid: osteoarthritis Medication and dose: Norco 10-325 # pills per month: 120   Last UDS date: 07-30-17 Opioid Treatment Agreement signed (Y/N): 11-01-17 Opioid Treatment Agreement last reviewed with patient:  02-04-18 NCCSRS reviewed this encounter (include red flags):  02-04-18     Review of Systems  Constitutional: Negative.   Respiratory: Negative.   Cardiovascular: Negative.   Musculoskeletal: Positive for arthralgias.  Neurological: Negative.        Objective:   Physical Exam  Constitutional: He is oriented to person, place, and time. He appears well-developed and well-nourished.  Cardiovascular: Normal rate, regular rhythm, normal heart sounds and intact distal pulses.  Pulmonary/Chest: Effort normal and breath sounds normal.  Neurological: He is alert and oriented to person, place, and time.          Assessment & Plan:  Pain management, meds were refilled.  Gershon CraneStephen Maxum Cassarino, MD

## 2018-02-17 ENCOUNTER — Encounter (HOSPITAL_COMMUNITY): Payer: Self-pay

## 2018-02-17 NOTE — Pre-Procedure Instructions (Signed)
[name]  02/17/2018      [id number] [place], [place] [facility] [address] [place] [address] Phone: [phone] Fax: [phone]    Your procedure is scheduled on Monday August 26.  [facility]30 [facility].M.  Call this number if you have problems the morning of surgery:  [phone]   Remember:  Do not eat or drink after midnight.    Take these medicines the morning of surgery with A SIP OF WATER:   Nebivolol (Bystolic) Amlodipine (norvasc) Clonazepam (Klonopin) if needed Fexofenadine (allegra) if needed Hydrocodone-acetaminophen (Norco) if needed Bactrim if needed  DO NOT TAKE Januvia or Metformin (Glucophage) the day of surgery  7 days prior to surgery STOP taking any Aleve, Naproxen, Ibuprofen, Motrin, Advil, Goody's, BC's, all herbal medications, fish oil, and all vitamins  Follow your surgeon's instructions on stopping Aspirin.      How to Manage Your Diabetes Before and After Surgery  Why is it important to control my blood sugar before and after surgery? . Improving blood sugar levels before and after surgery helps healing and can limit problems. . A way of improving blood sugar control is eating a healthy diet by: o  Eating less sugar and carbohydrates o  Increasing activity/exercise o  Talking with your doctor about reaching your blood sugar goals . High blood sugars (greater than 180 mg/dL) can raise your risk of infections and slow your recovery, so you will need to focus on controlling your diabetes during the weeks before surgery. . Make sure that the doctor who takes care of your diabetes knows about your planned surgery including the date and location.  How do I manage my blood sugar before surgery? . Check your blood sugar at least 4 times a day, starting 2 days before surgery, to make sure that the level is not too high or low. o Check your blood  sugar the morning of your surgery when you wake up and every 2 hours until you get to the Short Stay unit. . If your blood sugar is less than 70 mg/dL, you will need to treat for low blood sugar: o Do not take insulin. o Treat a low blood sugar (less than 70 mg/dL) with  cup of clear juice (cranberry or apple), 4 glucose tablets, OR glucose gel. Recheck blood sugar in 15 minutes after treatment (to make sure it is greater than 70 mg/dL). If your blood sugar is not greater than 70 mg/dL on recheck, call [phone] o  for further instructions. . Report your blood sugar to the short stay nurse when you get to Short Stay.  . If you are admitted to the hospital after surgery: o Your blood sugar will be checked by the staff and you will probably be given insulin after surgery (instead of oral diabetes medicines) to make sure you have good blood sugar levels. o The goal for blood sugar control after surgery is 80-180 mg/dL.                Do not wear jewelry  Do not wear lotions, powders, or colognes, or deodorant.  Do not shave 48 hours prior to surgery.  Men may shave face and neck.  Do not bring valuables to the hospital.  [facility] is not responsible for any belongings or valuables.  Contacts, dentures or bridgework may not be worn into surgery.  Leave your  suitcase in the car.  After surgery it may be brought to your room.  For patients admitted to the hospital, discharge time will be determined by your treatment team.  Patients discharged the day of surgery will not be allowed to drive home.   Special instructions:    [facility]- Preparing For Surgery  Before surgery, you can play an important role. Because skin is not sterile, your skin needs to be as free of germs as possible. You can reduce the number of germs on your skin by washing with CHG (chlorahexidine gluconate) Soap before surgery.  CHG is an antiseptic cleaner which kills germs and bonds with the skin to  continue killing germs even after washing.    Oral Hygiene is also important to reduce your risk of infection.  Remember - BRUSH YOUR TEETH THE MORNING OF SURGERY WITH YOUR REGULAR TOOTHPASTE  Please do not use if you have an allergy to CHG or antibacterial soaps. If your skin becomes reddened/irritated stop using the CHG.  Do not shave (including legs and underarms) for at least 48 hours prior to first CHG shower. It is OK to shave your face.  Please follow these instructions carefully.   1. Shower the NIGHT BEFORE SURGERY and the MORNING OF SURGERY with CHG.   2. If you chose to wash your hair, wash your hair first as usual with your normal shampoo.  3. After you shampoo, rinse your hair and body thoroughly to remove the shampoo.  4. Use CHG as you would any other liquid soap. You can apply CHG directly to the skin and wash gently with a scrungie or a clean washcloth.   5. Apply the CHG Soap to your body ONLY FROM THE NECK DOWN.  Do not use on open wounds or open sores. Avoid contact with your eyes, ears, mouth and genitals (private parts). Wash Face and genitals (private parts)  with your normal soap.  6. Wash thoroughly, paying special attention to the area where your surgery will be performed.  7. Thoroughly rinse your body with warm water from the neck down.  8. DO NOT shower/wash with your normal soap after using and rinsing off the CHG Soap.  9. Pat yourself dry with a CLEAN TOWEL.  10. Wear CLEAN PAJAMAS to bed the night before surgery, wear comfortable clothes the morning of surgery  11. Place CLEAN SHEETS on your bed the night of your first shower and DO NOT SLEEP WITH PETS.    Day of Surgery:  Do not apply any deodorants/lotions.  Please wear clean clothes to the hospital/surgery center.   Remember to brush your teeth WITH YOUR REGULAR TOOTHPASTE.    Please read over the following fact sheets that you were given. Coughing and Deep Breathing and Surgical Site  Infection Prevention

## 2018-02-18 ENCOUNTER — Other Ambulatory Visit: Payer: Self-pay

## 2018-02-18 ENCOUNTER — Other Ambulatory Visit (INDEPENDENT_AMBULATORY_CARE_PROVIDER_SITE_OTHER): Payer: Self-pay

## 2018-02-18 ENCOUNTER — Encounter (HOSPITAL_COMMUNITY)
Admission: RE | Admit: 2018-02-18 | Discharge: 2018-02-18 | Disposition: A | Discharge status: Home / Self Care | Payer: Medicare PPO | Source: Ambulatory Visit | Attending: Orthopedic Surgery | Admitting: Orthopedic Surgery

## 2018-02-18 ENCOUNTER — Encounter (HOSPITAL_COMMUNITY): Payer: Self-pay

## 2018-02-18 DIAGNOSIS — E785 Hyperlipidemia, unspecified: Secondary | ICD-10-CM | POA: Insufficient documentation

## 2018-02-18 DIAGNOSIS — I2510 Atherosclerotic heart disease of native coronary artery without angina pectoris: Secondary | ICD-10-CM | POA: Insufficient documentation

## 2018-02-18 DIAGNOSIS — G4700 Insomnia, unspecified: Secondary | ICD-10-CM | POA: Insufficient documentation

## 2018-02-18 DIAGNOSIS — Z01812 Encounter for preprocedural laboratory examination: Secondary | ICD-10-CM | POA: Insufficient documentation

## 2018-02-18 DIAGNOSIS — E119 Type 2 diabetes mellitus without complications: Secondary | ICD-10-CM | POA: Diagnosis not present

## 2018-02-18 DIAGNOSIS — I10 Essential (primary) hypertension: Secondary | ICD-10-CM | POA: Insufficient documentation

## 2018-02-18 LAB — BASIC METABOLIC PANEL
ANION GAP: 10 (ref 5–15)
BUN: 11 mg/dL (ref 8–23)
CALCIUM: 9.2 mg/dL (ref 8.9–10.3)
CO2: 25 mmol/L (ref 22–32)
CREATININE: 0.88 mg/dL (ref 0.61–1.24)
Chloride: 102 mmol/L (ref 98–111)
GFR calc non Af Amer: >60 mL/min (ref >60)
GLUCOSE: 267 mg/dL — AB (ref 70–99)
POTASSIUM: 4 mmol/L (ref 3.5–5.1)
SODIUM: 137 mmol/L (ref 135–145)

## 2018-02-18 LAB — CBC
HCT: 41.5 % (ref 39.0–52.0)
Hemoglobin: 13.9 g/dL (ref 13.0–17.0)
MCH: 29.8 pg (ref 26.0–34.0)
MCHC: 33.5 g/dL (ref 30.0–36.0)
MCV: 89.1 fL (ref 78.0–100.0)
Platelets: 166 10*3/uL (ref 150–400)
RBC: 4.66 MIL/uL (ref 4.22–5.81)
RDW: 12.7 % (ref 11.5–15.5)
WBC: 6.8 10*3/uL (ref 4.0–10.5)

## 2018-02-18 LAB — GLUCOSE, CAPILLARY: GLUCOSE-CAPILLARY: 242 mg/dL — AB (ref 70–99)

## 2018-02-18 LAB — HEMOGLOBIN A1C
HEMOGLOBIN A1C: 10.4 % — AB (ref 4.8–5.6)
MEAN PLASMA GLUCOSE: 251.78 mg/dL

## 2018-02-18 NOTE — Progress Notes (Signed)
PCP - Gershon CraneStephen Fry Cardiologist - Dr. Tresa EndoKelly  EKG -  09/03/17 Stress Test - 06/14/14 ECHO - 06/14/14  Fasting Blood Sugar - anywhere from 100-300 per patient. He does not check his CBG daily, but he has been checking it recently. Pt encouraged to notify PCP if CBG over 200 frequently. Will check A1c today.  Aspirin Instructions:Note from cardiology Tereso Newcomer(Scott Weaver) stating  "Ideally, he should remain on aspirin without interruption.  However, if his risk for bleeding is too great, this can be held for 5 to 7 days prior to procedure and resumed afterwards and felt to be safe"- left message for Dr. Diamantina Providenceean's office to clarify if pt needs to stop taking this.   Anesthesia review:  Heart hx  Patient denies shortness of breath, fever, cough and chest pain at PAT appointment   Patient verbalized understanding of instructions that were given to them at the PAT appointment. Patient was also instructed that they will need to review over the PAT instructions again at home before surgery.

## 2018-02-21 ENCOUNTER — Ambulatory Visit (INDEPENDENT_AMBULATORY_CARE_PROVIDER_SITE_OTHER): Payer: Medicare PPO | Admitting: Family Medicine

## 2018-02-21 ENCOUNTER — Encounter: Payer: Self-pay | Admitting: Family Medicine

## 2018-02-21 ENCOUNTER — Encounter (HOSPITAL_COMMUNITY): Payer: Self-pay | Admitting: Physician Assistant

## 2018-02-21 VITALS — BP 120/64 | HR 61 | Temp 98.1°F | Ht 69.5 in | Wt 201.6 lb

## 2018-02-21 DIAGNOSIS — I10 Essential (primary) hypertension: Secondary | ICD-10-CM | POA: Diagnosis not present

## 2018-02-21 DIAGNOSIS — E119 Type 2 diabetes mellitus without complications: Secondary | ICD-10-CM

## 2018-02-21 MED ORDER — DAPAGLIFLOZIN PROPANEDIOL 5 MG PO TABS: 5.0000 mg | ORAL_TABLET | Freq: Every day | ORAL | 3 refills | Status: DC

## 2018-02-21 NOTE — Progress Notes (Signed)
Anesthesia Chart Review:  Case:  [id number] Date/Time:  02/28/18 0715   Procedure:  RIGHT SHOULDER ARTHROSCOPY WITH SUBACROMIAL DECOMPRESSION, BICEPS TENODESIS, MINI OPEN ROTATOR CUFF TEAR REPAIR (Right )   Anesthesia type:  General   Pre-op diagnosis:  right shoulder rotator cuff tear   Location:  [facility] OR ROOM 08 / [place] [facility]   Surgeon:  Meredith Pel, MD      DISCUSSION: 67 yo male former smoker for above procedure. Pertinent hx includes DMII, HA, HTN, HLD, CAD (s/p CABG 2002, EF 55-60% by Echo 2015).  Cardiac clearance per telephone encounter by Richardson Dopp, PA-C 01/25/2018: "He has a history of CAD status post CABG in 2002, hypertension, hyperlipidemia, diabetes.  Nuclear stress test in 2015 was negative for ischemia.  Echocardiogram 2015 demonstrated normal LV function. Patient was contacted 01/25/2018 in reference to pre-operative risk assessment for pending surgery as outlined below.  [name] was last seen on 09/03/2017 by Dr. Claiborne Billings.  Since that day, [name] has done well without chest discomfort or shortness of breath.  He has no limitations to activity, aside from his right shoulder. RCRI: 0.9.Therefore, based on ACC/AHA guidelines, the patient would be at acceptable risk for the planned procedure without further cardiovascular testing.  Ideally, he should remain on aspirin without interruption.  However, if his risk for bleeding is too great, this can be held for 5 to 7 days prior to procedure and resumed afterwards and felt to be safe."  Significantly elevated A1c of 10.4 at PAT testing. Pt saw his PCP 02/21/2018 to discuss. Per Dr. Barbie Banner notes will add Wilder Glade to antiDM meds, but he states "there is a good chance he may need to postpone the surgery if we cannot at least get all his readings below 300."  Dr. Randel Pigg office notified of poor BG control.   Ability to proceed with surgery will depend on BG control and is currently being managed by Dr. Alysia Penna.   VS: BP  131/72   Pulse (!) 56   Temp 36.8 C   Resp 20   Ht 5' 9.5" (1.765 m)   Wt 92 kg   SpO2 97%   BMI 29.53 kg/m   PROVIDERS: Laurey Morale, MD is PCP  Shelva Majestic, MD is Cardiologist last seen 09/03/2017  LABS: Significantly elevated A1c, pt has already seen PCP Dr. Sarajane Jews. Results called to Dr. Randel Pigg office. (all labs ordered are listed, but only abnormal results are displayed)  Labs Reviewed  GLUCOSE, CAPILLARY - Abnormal; Notable for the following components:      Result Value   Glucose-Capillary 242 (*)    All other components within normal limits  BASIC METABOLIC PANEL - Abnormal; Notable for the following components:   Glucose, Bld 267 (*)    All other components within normal limits  HEMOGLOBIN A1C - Abnormal; Notable for the following components:   Hgb A1c MFr Bld 10.4 (*)    All other components within normal limits  CBC     IMAGES: N/A  EKG: 09/03/2017: Sinus brady (59bpm).  CV: Echo 06/14/2014: Study Conclusions  - Left ventricle: The cavity size was normal. There was mild focal basal hypertrophy of the septum. Systolic function was normal. The estimated ejection fraction was in the range of 55% to 60%. Doppler parameters are consistent with abnormal left ventricular relaxation (grade 1 diastolic dysfunction). - Mitral valve: There was mild regurgitation. - Left atrium: The atrium was moderately dilated.  Impressions:  - Normal LV  function; grade 1 diastolic dysfunction; moderate LAE; mild MR; trace TR.  Myocardial perfusion imaging 06/14/2014: Impression Exercise Capacity:  Good exercise capacity with patient exercising to a 10.1 met workload. BP Response:  Hypertensive blood pressure response to a peak of 205/109 in early recovery. Clinical Symptoms:  No chest pain; mild shortness of breath ECG Impression:  Significant ST abnormalities consistent with ischemia. Comparison with Prior Nuclear Study: No images to compare  Overall  Impression:  Low risk stress nuclear study demonstrating exercise induced 1-2 mm ST depression with normal scintigraphic images.   LV Wall Motion:  NL LV Function, EF 57%; NL Wall Motion  Past Medical History:  Diagnosis Date  . CAD (coronary artery disease)    nuclear stress test-09/09/11 low risk scan EF61%, sees Dr. Claiborne Billings   . Chronic headache   . Chronic neck pain   . Diabetes mellitus   . Hyperlipidemia   . Hypertension 12/26/08   ECHO- EF>55%  . Insomnia     Past Surgical History:  Procedure Laterality Date  . APPENDECTOMY    . cervical spine injection    . COLONOSCOPY  04-02-06    per [facility], clear, repeat in 10 yrs  . CORONARY ARTERY BYPASS GRAFT  2002   4 vessels    MEDICATIONS: . ACCU-CHEK AVIVA PLUS test strip  . amLODipine (NORVASC) 5 MG tablet  . aspirin 81 MG tablet  . Calcium Carbonate-Vitamin D (CALCIUM 600+D PO)  . clonazePAM (KLONOPIN) 0.5 MG tablet  . Coenzyme Q10 (COQ10) 100 MG CAPS  . dapagliflozin propanediol (FARXIGA) 5 MG TABS tablet  . fexofenadine (ALLEGRA) 180 MG tablet  . gabapentin (NEURONTIN) 300 MG capsule  . [START ON 04/06/2018] HYDROcodone-acetaminophen (NORCO) 10-325 MG tablet  . JANUVIA 100 MG tablet  . Lancets MISC  . Liniments (SALONPAS PAIN RELIEF PATCH EX)  . lisinopril (PRINIVIL,ZESTRIL) 20 MG tablet  . meloxicam (MOBIC) 15 MG tablet  . Menthol, Topical Analgesic, (BENGAY EX)  . metFORMIN (GLUCOPHAGE) 1000 MG tablet  . nebivolol (BYSTOLIC) 10 MG tablet  . Omega-3 350 MG CPDR  . Pitavastatin Calcium (LIVALO) 4 MG TABS  . sulfamethoxazole-trimethoprim (BACTRIM DS,SEPTRA DS) 800-160 MG tablet   No current facility-administered medications for this encounter.     Wynonia Musty [facility] Short Stay Center/Anesthesiology Phone [phone] 02/23/2018 12:19 PM

## 2018-02-21 NOTE — Progress Notes (Signed)
   Subjective:    Patient ID: [name], male    DOB: [birth year], 67 y.o.   MRN: [id number]  HPI Here to discuss elevated glucose readings. His last A1c in May was 7.4, and he has been watching a fairly strict diet. He is scheduled for right shoulder arthroscopy per Dr. August Saucerean on 02-28-18 and he was recently seen for a preop visit. His A1c then had jumped up to 10.4, and in fact his glucoses at home have been high in the high 200s and low 300s lately. He is taking Metformin 1000 mg bid and Januvia 100 mg daily.    Review of Systems  Constitutional: Negative.   Respiratory: Negative.   Cardiovascular: Negative.   Musculoskeletal: Positive for arthralgias.  Neurological: Negative.        Objective:   Physical Exam  Constitutional: He is oriented to person, place, and time. He appears well-developed and well-nourished.  Cardiovascular: Normal rate, regular rhythm, normal heart sounds and intact distal pulses.  Pulmonary/Chest: Effort normal and breath sounds normal.  Neurological: He is alert and oriented to person, place, and time.          Assessment & Plan:  Poorly controlled DM. We will add Farxiga 5 mg daily. We will see how he does this week, but there is a good chance he may need to postpone the surgery if we cannot at least ger all his readings below 300.  Gershon CraneStephen Halaina Vanduzer, MD

## 2018-02-23 NOTE — Progress Notes (Signed)
Please call patient with results. Thanks hemoglobin A1c too high for elective surgery.  We need to cancel his surgery and rescheduled for couple months from now..  I think that he is going to call him and inform him of same.

## 2018-02-24 ENCOUNTER — Other Ambulatory Visit (INDEPENDENT_AMBULATORY_CARE_PROVIDER_SITE_OTHER): Payer: Self-pay | Admitting: Orthopedic Surgery

## 2018-02-24 ENCOUNTER — Telehealth (INDEPENDENT_AMBULATORY_CARE_PROVIDER_SITE_OTHER): Payer: Self-pay | Admitting: Orthopedic Surgery

## 2018-02-24 ENCOUNTER — Other Ambulatory Visit: Payer: Self-pay | Admitting: Cardiovascular Disease

## 2018-02-24 DIAGNOSIS — S46011D Strain of muscle(s) and tendon(s) of the rotator cuff of right shoulder, subsequent encounter: Secondary | ICD-10-CM

## 2018-02-24 NOTE — Telephone Encounter (Signed)
Called patient and spoke with wife [name](Donna) to let patient know that our office  had received a call from the Anesthesiologist at Short Stay letting Dr. August Saucerean know that patient's A1C level is at 10, where as 3 months ago it was at 7.  Dr. Gershon CraneStephen Fry had mentioned in his notes that surgery may be postponed if they could not get all of his readings below 300. Because of the A1c level,  Dr. August Saucerean feels his surgery shoulder be cancelled. Patient will need to follow up his PCP Dr. Clent RidgesFry and have it checked and once under control surgery can be rescheduled, which may take a couple of months. Patient is agreeable and will follow up with Dr. Clent RidgesFry.

## 2018-02-28 ENCOUNTER — Ambulatory Visit (HOSPITAL_COMMUNITY): Admission: RE | Admit: 2018-02-28 | Payer: Medicare PPO | Source: Ambulatory Visit | Admitting: Orthopedic Surgery

## 2018-02-28 ENCOUNTER — Encounter (HOSPITAL_COMMUNITY): Admission: RE | Payer: Self-pay | Source: Ambulatory Visit

## 2018-02-28 SURGERY — SHOULDER ARTHROSCOPY WITH SUBACROMIAL DECOMPRESSION, ROTATOR CUFF REPAIR AND BICEP TENDON REPAIR
Anesthesia: General | Laterality: Right

## 2018-03-09 ENCOUNTER — Inpatient Hospital Stay (INDEPENDENT_AMBULATORY_CARE_PROVIDER_SITE_OTHER): Payer: Medicare PPO | Admitting: Orthopedic Surgery

## 2018-03-11 ENCOUNTER — Ambulatory Visit: Payer: Self-pay | Admitting: Family Medicine

## 2018-03-11 ENCOUNTER — Ambulatory Visit (INDEPENDENT_AMBULATORY_CARE_PROVIDER_SITE_OTHER): Payer: Medicare PPO | Admitting: Family Medicine

## 2018-03-11 ENCOUNTER — Encounter: Payer: Self-pay | Admitting: Family Medicine

## 2018-03-11 VITALS — BP 120/64 | HR 71 | Temp 97.7°F | Ht 69.5 in | Wt 203.4 lb

## 2018-03-11 DIAGNOSIS — I10 Essential (primary) hypertension: Secondary | ICD-10-CM | POA: Diagnosis not present

## 2018-03-11 DIAGNOSIS — E119 Type 2 diabetes mellitus without complications: Secondary | ICD-10-CM

## 2018-03-11 MED ORDER — PIOGLITAZONE HCL 30 MG PO TABS: 30.0000 mg | ORAL_TABLET | Freq: Every day | ORAL | 5 refills | Status: DC

## 2018-03-11 NOTE — Telephone Encounter (Signed)
Phone call returned to pt. Reported he has had intermittent severe dizziness since starting the Farixga.  Stated he has felt like he could pass out at times.  Denied any unilateral weakness of extremities, speech difficulty or confusion.  Denied chest pain or shortness of breath.  Reported he drinks water regularly to stay hydrated. Pt. and wife are in car at this time; unable to check BP or pulse.  Reported blood sugars yesterday: 112 in AM, and 145 in PM.  Has not checked blood sugar today.  Reported he held his Farixga yesterday and today, and could tell that the dizziness was not as severe. Pt. C/o feeling "exhausted" all the time.  Contacted FC in office, and rec'd approval to sched. Office appt. Today for evaluation.  Appt. Given at 1:00 PM.  Pt. and wife agree with plan.  Care advice given per protocol.  Verb. Understanding.           Reason for Disposition . SEVERE dizziness (e.g., unable to stand, requires support to walk, feels like passing out now)  Answer Assessment - Initial Assessment Questions 1. DESCRIPTION: "Describe your dizziness."     Just the least little bit of bending over, I feel like I could pass out.  I feel very tired; I am just exhaused  2. LIGHTHEADED: "Do you feel lightheaded?" (e.g., somewhat faint, woozy, weak upon standing)     Feels "real light headed"  3. VERTIGO: "Do you feel like either you or the room is spinning or tilting?" (i.e. vertigo)     Denied  4. SEVERITY: "How bad is it?"  "Do you feel like you are going to faint?" "Can you stand and walk?"   - MILD - walking normally   - MODERATE - interferes with normal activities (e.g., work, school)    - SEVERE - unable to stand, requires support to walk, feels like passing out now.      Severe 5. ONSET:  "When did the dizziness begin?"     Since starting the new diabetic medication 6. AGGRAVATING FACTORS: "Does anything make it worse?" (e.g., standing, change in head position)     Moving around or bending   7. HEART RATE: "Can you tell me your heart rate?" "How many beats in 15 seconds?"  (Note: not all patients can do this)       *No Answer* 8. CAUSE: "What do you think is causing the dizziness?"     Is certain it is the new medication   9. RECURRENT SYMPTOM: "Have you had dizziness before?" If so, ask: "When was the last time?" "What happened that time?"     Denied 10. OTHER SYMPTOMS: "Do you have any other symptoms?" (e.g., fever, chest pain, vomiting, diarrhea, bleeding)       Feeling very tired/ exhausted; denied chest pain, shortness of breath, weakness of one side of body, confusion, or speech problems  Protocols used: DIZZINESS [facility] Message from Tamela Oddi, NT sent at 03/11/2018 10:36 AM EDT   Patient wife called and states that Dr. Clent Ridges has changed his diabetes medicine and it has caused him to be dizzy. It was bringing his sugars down but the patient is not taken it as directed due to being dizzy. So his blood sugar are elevated. She states his blood sugars has been as high as 200 or more. He has not checked his blood sugar today. Please call CB# [phone]

## 2018-03-11 NOTE — Progress Notes (Signed)
   Subjective:    Patient ID: [name], male    DOB: 04-Nov-1950, 68 y.o.   MRN: [id number]  HPI Here to follow up on diabetes. For the past few weeks he has been taking [place] along with his Metformin and Januvia, and this has been very effective at bringing his glucose readings down. They have ranged from 110 to 140 for the most part. However he has had a lot of apparent side effects including dizziness, nausea, fatigue, and muscle aches. He had been scheduled for an arthroscopy of the shoulder with Dr. August Saucer, but this was postponed when they saw his high A1c.    Review of Systems  Constitutional: Positive for fatigue.  Respiratory: Negative.   Cardiovascular: Negative.   Gastrointestinal: Positive for nausea.  Genitourinary: Negative.   Musculoskeletal: Positive for myalgias.  Neurological: Positive for dizziness.       Objective:   Physical Exam  Constitutional: He is oriented to person, place, and time. He appears well-developed and well-nourished.  Cardiovascular: Normal rate, regular rhythm, normal heart sounds and intact distal pulses.  Pulmonary/Chest: Effort normal and breath sounds normal.  Neurological: He is alert and oriented to person, place, and time.          Assessment & Plan:  The Marcelline Deist has been effective but he has side effects so we will stop this. Instead he will try Actos 30 mg daily. Continue the Metformin and Januvia. Recheck in 30 days  Gershon Crane, MD

## 2018-03-14 ENCOUNTER — Other Ambulatory Visit: Payer: Self-pay | Admitting: Family Medicine

## 2018-03-14 NOTE — Telephone Encounter (Signed)
Last OV 03/11/2018   Last refilled 07/30/2017 disp 60 with 5 refills  Sent to PCP for approval

## 2018-03-15 NOTE — Telephone Encounter (Signed)
Call in #60 with 5 rf 

## 2018-03-19 ENCOUNTER — Other Ambulatory Visit: Payer: Self-pay | Admitting: Family Medicine

## 2018-04-05 ENCOUNTER — Encounter: Payer: Self-pay | Admitting: Cardiovascular Disease

## 2018-04-05 ENCOUNTER — Ambulatory Visit (INDEPENDENT_AMBULATORY_CARE_PROVIDER_SITE_OTHER): Payer: Medicare PPO | Admitting: Cardiovascular Disease

## 2018-04-05 VITALS — BP 164/82 | HR 53 | Ht 69.5 in | Wt 205.2 lb

## 2018-04-05 DIAGNOSIS — I2510 Atherosclerotic heart disease of native coronary artery without angina pectoris: Secondary | ICD-10-CM

## 2018-04-05 DIAGNOSIS — I10 Essential (primary) hypertension: Secondary | ICD-10-CM

## 2018-04-05 DIAGNOSIS — E118 Type 2 diabetes mellitus with unspecified complications: Secondary | ICD-10-CM | POA: Diagnosis not present

## 2018-04-05 DIAGNOSIS — Z79899 Other long term (current) drug therapy: Secondary | ICD-10-CM | POA: Diagnosis not present

## 2018-04-05 DIAGNOSIS — Z951 Presence of aortocoronary bypass graft: Secondary | ICD-10-CM

## 2018-04-05 DIAGNOSIS — E785 Hyperlipidemia, unspecified: Secondary | ICD-10-CM | POA: Diagnosis not present

## 2018-04-05 LAB — COMPREHENSIVE METABOLIC PANEL
A/G RATIO: 1.9 (ref 1.2–2.2)
ALK PHOS: 73 IU/L (ref 39–117)
ALT: 24 IU/L (ref 0–44)
AST: 26 IU/L (ref 0–40)
Albumin: 4.5 g/dL (ref 3.6–4.8)
BILIRUBIN TOTAL: 0.3 mg/dL (ref 0.0–1.2)
BUN/Creatinine Ratio: 12 (ref 10–24)
BUN: 11 mg/dL (ref 8–27)
CHLORIDE: 100 mmol/L (ref 96–106)
CO2: 26 mmol/L (ref 20–29)
Calcium: 9.3 mg/dL (ref 8.6–10.2)
Creatinine, Ser: 0.93 mg/dL (ref 0.76–1.27)
GFR calc non Af Amer: 84 mL/min/{1.73_m2} (ref >59)
GFR, EST AFRICAN AMERICAN: 97 mL/min/{1.73_m2} (ref >59)
GLUCOSE: 130 mg/dL — AB (ref 65–99)
Globulin, Total: 2.4 g/dL (ref 1.5–4.5)
POTASSIUM: 3.8 mmol/L (ref 3.5–5.2)
Sodium: 143 mmol/L (ref 134–144)
Total Protein: 6.9 g/dL (ref 6.0–8.5)

## 2018-04-05 LAB — LIPID PANEL
CHOLESTEROL TOTAL: 153 mg/dL (ref 100–199)
Chol/HDL Ratio: 3.1 ratio (ref 0.0–5.0)
HDL: 49 mg/dL (ref >39)
LDL Calculated: 88 mg/dL (ref 0–99)
Triglycerides: 81 mg/dL (ref 0–149)
VLDL CHOLESTEROL CAL: 16 mg/dL (ref 5–40)

## 2018-04-05 MED ORDER — LISINOPRIL 40 MG PO TABS: 40.0000 mg | ORAL_TABLET | Freq: Every day | ORAL | 3 refills | Status: DC

## 2018-04-05 NOTE — Progress Notes (Signed)
Patient ID: [name], male   DOB: 1950/09/27, 67 y.o.   MRN: [id number]      Primary M.D.: Dr. Alysia Penna  HPI: [name] is a 67 y.o. male presents to the office today for a 6 day month  follow up cardiology evaluation.   [name] has CAD and in June 2002 underwent CABG surgery by Dr. Roxan Hockey with a sequential LIMA graft to the second diagonal and LAD, and sequential radial graft to the first and third diagonal branches. An echo Doppler study in 2010 showed normal systolic function with mild MR and trace TR.  A  nuclear perfusion study in March 2013 showed normal perfusion and function without scar or ischemia.  Additional problems include hypertension, type 2 diabetes mellitus, and hyperlipidemia. Remotely, he had worked as a Garment/textile technologist minor and in the past was some question of possible black lung. 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  In November 2015 he complained of a slight change in symptomatology with exertional shortness of breath and some arm weakness.  Prior to bypass surgery, he had never experienced chest pain but demonstrated exertional shortness of breath and arm discomfort.  He admitted to fatigue.  He denied any major episodes of chest pressure.  A 2-D echo Doppler study on 06/14/2014 showed an ejection fraction of 55-60% with mild focal basal hypertrophy of the septum; grade 1 diastolic dysfunction.  There was evidence for mild mitral regurgitation and moderate left atrial dilatation.  A nuclear perfusion study on December 11,2015 was interpreted as low risk.  He developed 1-2 mm of horizontal ST segment depression and had normal scintigraphic myocardial perfusion images.  Ejection fraction was 57%.  There was normal LV function without wall motion abnormality.  He has a history of hyperlipidemia and remotely self increased his simvastatin from 40 mg to 80 mg and developed occasional  myalgias.  He has been on Bystolic 10 mg and lisinopril 30 mg for his hypertension.  He is on metformin 1000 mg twice a day as well as DiaBeta 5 mg for his type 2 diabetes mellitus.  When I last saw him in October 2018, he had stopped taking Livalo for approximately one month.  He had been busy doing home projects and denied any recurrent chest pain.  During his last evaluation.  I had a lengthy discussion with him and recommended another attempt at a trial of Livalo.  I also added supplemental coenzyme Q 10.  Over this time period, he was able to reinitiate 2 mg daily and has been able to tolerate this without myalgias.  Subsequent blood work did show improvement in his lipid status on 08/04/2017 with a total cholesterol 149, LDL 82.  Triglycerides 148, and HDL 37.  He has continued to be on Januvia and metformin for his diabetes mellitus.  Hypertension.  He has remained on amlodipine 5 mg, lisinopril 30 mg, and Bystolic 10 mg.   I last saw him in March 2019.  He was tolerating Livalo and I recommended further titration to 4 mg daily and attempt to obtain an LDL less than 70.  Since I saw him, he has had issues with poorly controlled diabetes mellitus.  Apparently he was started on for [employer] but developed some dizziness secondary to this.  He is now on metformin, Januvia, and was started on Actos.  Earlier this year he had a fall off a ladder.  He ultimately will require  rotator cuff surgery on his right shoulder.  His surgery was delayed due to his glucose issues.  He denies any episodes of recurrent angina and is now 17 years status post CABG revascularization surgery.  However, he never had classic angina prior to his bypass.  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  He presents for evaluation.   Past Medical History:  Diagnosis Date  . CAD (coronary artery disease)    nuclear stress test-09/09/11 low risk scan EF61%, sees  Dr. Claiborne Billings   . Chronic headache   . Chronic neck pain   . Diabetes mellitus   . Hyperlipidemia   . Hypertension 12/26/08   ECHO- EF>55%  . Insomnia     Past Surgical History:  Procedure Laterality Date  . APPENDECTOMY    . cervical spine injection    . COLONOSCOPY  04-02-06    per [facility], clear, repeat in 10 yrs  . CORONARY ARTERY BYPASS GRAFT  2002   4 vessels    Allergies  Allergen Reactions  . Codeine Anaphylaxis    Pt. verbalized that he has taken and can take Hydrocodone, Oxycodone w/o issue - codeine allergy only  . Crestor [Rosuvastatin]     Myalgia   . Isosorbide Nitrate Other (See Comments)    Headaches  . Lipitor [Atorvastatin]     myalgia  . Zolpidem Tartrate     REACTION: HA's  . Ciprofloxacin Hives and Rash    Current Outpatient Medications  Medication Sig Dispense Refill  . ACCU-CHEK AVIVA PLUS test strip TEST SUGAR ONCE A DAY AND DIAGNOSIS CODE IS E 11.9 100 each 1  . amLODipine (NORVASC) 5 MG tablet TAKE 1 TABLET BY MOUTH EVERY DAY 90 tablet 0  . aspirin 81 MG tablet Take 81 mg by mouth daily.     . Calcium Carbonate-Vitamin D (CALCIUM 600+D PO) Take 1 tablet by mouth daily.    . clonazePAM (KLONOPIN) 0.5 MG tablet TAKE 1 TABLET (0.5 MG TOTAL) BY MOUTH 2 (TWO) TIMES DAILY AS NEEDED FOR ANXIETY. 60 tablet 5  . Coenzyme Q10 (COQ10) 100 MG CAPS Take 100 mg by mouth daily.     . fexofenadine (ALLEGRA) 180 MG tablet Take 180 mg by mouth daily as needed for allergies or rhinitis.    Marland Kitchen gabapentin (NEURONTIN) 300 MG capsule TAKE 3 CAPSULES BY MOUTH AT BEDTIME 270 capsule 0  . [START ON 04/06/2018] HYDROcodone-acetaminophen (NORCO) 10-325 MG tablet Take 1 tablet by mouth every 6 (six) hours as needed for moderate pain. 120 tablet 0  . JANUVIA 100 MG tablet TAKE 1 TABLET BY MOUTH EVERY DAY 90 tablet 3  . Lancets MISC Fast click lancet accu check may substitute for whever pt has 100 each 3  . lisinopril (PRINIVIL,ZESTRIL) 20 MG tablet TAKE 1 AND 1/2 TABLETS DAILY  BY MOUTH 135 tablet 0  . meloxicam (MOBIC) 15 MG tablet Take 1 tablet (15 mg total) by mouth daily. 90 tablet 3  . Menthol, Topical Analgesic, (BENGAY EX) Apply 1 application topically daily as needed (pain).    . metFORMIN (GLUCOPHAGE) 1000 MG tablet TAKE 1 TABLET BY MOUTH TWICE A DAY 180 tablet 3  . nebivolol (BYSTOLIC) 10 MG tablet Take 1 tablet (10 mg total) by mouth daily. 30 tablet 11  . Omega-3 350 MG CPDR Take 350 mg by mouth daily.    . pioglitazone (ACTOS) 30 MG tablet Take  1 tablet (30 mg total) by mouth daily. 30 tablet 5  . Pitavastatin Calcium (LIVALO) 4 MG TABS Take 1 tablet (4 mg total) by mouth daily. 90 tablet 3  . sulfamethoxazole-trimethoprim (BACTRIM DS,SEPTRA DS) 800-160 MG tablet Take 1 tablet by mouth 2 (two) times daily as needed (bladder infections).     No current facility-administered medications for this visit.     Social History   Socioeconomic History  . Marital status: Married    Spouse name: Not on file  . Number of children: Not on file  . Years of education: Not on file  . Highest education level: Not on file  Occupational History  . Not on file  Social Needs  . Financial resource strain: Not on file  . Food insecurity:    Worry: Not on file    Inability: Not on file  . Transportation needs:    Medical: Not on file    Non-medical: Not on file  Tobacco Use  . Smoking status: Former Smoker    Last attempt to quit: 11/26/1970    Years since quitting: 47.3  . Smokeless tobacco: Never Used  Substance and Sexual Activity  . Alcohol use: No    Alcohol/week: 0.0 standard drinks  . Drug use: No  . Sexual activity: Not on file  Lifestyle  . Physical activity:    Days per week: Not on file    Minutes per session: Not on file  . Stress: Not on file  Relationships  . Social connections:    Talks on phone: Not on file    Gets together: Not on file    Attends religious service: Not on file    Active member of club or organization: Not on file     Attends meetings of clubs or organizations: Not on file    Relationship status: Not on file  . Intimate partner violence:    Fear of current or ex partner: Not on file    Emotionally abused: Not on file    Physically abused: Not on file    Forced sexual activity: Not on file  Other Topics Concern  . Not on file  Social History Narrative  . Not on file    Family History  Problem Relation Age of Onset  . Liver cancer Father   . Cancer Brother   . Cancer Brother   . Cancer Brother   . Coronary artery disease Unknown   . Hypertension Unknown   . Heart disease Mother   . Colon cancer Neg Hx   . Stomach cancer Neg Hx   . Esophageal cancer Neg Hx    Socially he is married. He has 3 children 8 grandchildren and 4 great-grandchildren. There is no tobacco or alcohol use. He is now retired.   ROS General: Negative; No fevers, chills, or night sweats;  HEENT: Decrease in hearing ,No changes in vision  sinus congestion, difficulty swallowing Pulmonary: History of "black lung" Cardiovascular: See history of present illness GI: Negative; No nausea, vomiting, diarrhea, or abdominal pain GU: Negative; No dysuria, hematuria, or difficulty voiding Musculoskeletal: Negative; no myalgias, joint pain, or weakness Hematologic/Oncology: Negative; no easy bruising, bleeding Endocrine: Positive for diabetes mellitus Neuro: Positive for paresthesias in his legs. Skin: Negative; No rashes or skin lesions Psychiatric: Negative; No behavioral problems, depression Sleep: Positive for restless legs; No snoring, daytime sleepiness, hypersomnolence, bruxism, hypnogognic hallucinations, no cataplexy Other comprehensive 14 point system review is negative.   PE BP (!) 164/82  Pulse (!) 53   Ht 5' 9.5" (1.765 m)   Wt 205 lb 3.2 oz (93.1 kg)   BMI 29.87 kg/m   Repeat blood pressure by me was elevated at 154/84  Wt Readings from Last 3 Encounters:  04/05/18 205 lb 3.2 oz (93.1 kg)  03/11/18 203 lb  6.4 oz (92.3 kg)  02/21/18 201 lb 9.6 oz (91.4 kg)   General: Alert, oriented, no distress.  Skin: normal turgor, no rashes, warm and dry HEENT: Normocephalic, atraumatic. Pupils equal round and reactive to light; sclera anicteric; extraocular muscles intact;  Nose without nasal septal hypertrophy Mouth/Parynx benign; Mallinpatti scale 3 Neck: No JVD, no carotid bruits; normal carotid upstroke Lungs: clear to ausculatation and percussion; no wheezing or rales Chest wall: without tenderness to palpitation Heart: PMI not displaced, RRR, s1 s2 normal, 1/6 systolic murmur, no diastolic murmur, no rubs, gallops, thrills, or heaves Abdomen: soft, nontender; no hepatosplenomehaly, BS+; abdominal aorta nontender and not dilated by palpation. Back: no CVA tenderness Pulses 2+ Musculoskeletal: full range of motion, normal strength, no joint deformities Extremities: no clubbing cyanosis or edema, Homan's sign negative  Neurologic: grossly nonfocal; Cranial nerves grossly wnl Psychologic: Normal mood and affect  ECG (independently read by me): Sinus bradycardia 53 bpm.  Early transition.  No ectopy.  March 2019 ECG (independently read by me): Sinus bradycardia 59 bpm.  Early transition.  Normal intervals.  October 2018 ECG (independently read by me): Normal sinus rhythm at 60 bpm.  Nonspecific T wave abnormality.  Normal intervals.  No ectopy.  May 2018 ECG (independently read by me): Sinus bradycardia 59 bpm.  No significant ST-T changes.  Early transition.  April 2017 ECG (independently read by me): Sinus bradycardia at 53 bpm.  Q waves in III and F.  March 2016 ECG (independently read by me): Normal sinus rhythm at 67.  No ectopy.  November 2015 ECG (independently read by me): Normal sinus rhythm at 65 bpm.  Early transition.  No significant ST segment changes.  Normal intervals.  Prior ECG: Normal sinus rhythm at 58 beats per minute. QTc interval 428 ms. PR interval 170 ms. Nonspecific ST  changes   LABS:  BMP Latest Ref Rng & Units 02/18/2018 11/22/2017 08/04/2017  Glucose 70 - 99 mg/dL 267(H) 180(H) 167(H)  BUN 8 - 23 mg/dL 11 15 16   Creatinine 0.61 - 1.24 mg/dL 0.88 0.84 0.90  BUN/Creat Ratio 6 - 22 (calc) - NOT APPLICABLE -  Sodium 119 - 145 mmol/L 137 141 140  Potassium 3.5 - 5.1 mmol/L 4.0 4.4 4.0  Chloride 98 - 111 mmol/L 102 105 102  CO2 22 - 32 mmol/L 25 30 29   Calcium 8.9 - 10.3 mg/dL 9.2 9.3 9.2    Hepatic Function Latest Ref Rng & Units 11/22/2017 08/04/2017 07/27/2017  Total Protein 6.1 - 8.1 g/dL 7.0 7.2 6.9  Albumin 3.5 - 5.2 g/dL - 4.3 -  AST 10 - 35 U/L 22 24 27   ALT 9 - 46 U/L 27 30 33  Alk Phosphatase 39 - 117 U/L - 78 -  Total Bilirubin 0.2 - 1.2 mg/dL 0.4 0.4 0.5  Bilirubin, Direct 0.0 - 0.3 mg/dL - 0.1 0.1    CBC Latest Ref Rng & Units 02/18/2018 08/04/2017 11/05/2016  WBC 4.0 - 10.5 K/uL 6.8 7.4 7.9  Hemoglobin 13.0 - 17.0 g/dL 13.9 14.5 14.5  Hematocrit 39.0 - 52.0 % 41.5 42.9 43.1  Platelets 150 - 400 K/uL 166 191.0 202   Lab Results  Component  Value Date   MCV 89.1 02/18/2018   MCV 88.6 08/04/2017   MCV 90.9 11/05/2016    Lab Results  Component Value Date   TSH 2.78 08/04/2017   Lab Results  Component Value Date   HGBA1C 10.4 (H) 02/18/2018    BNP No results found for: PROBNP  Lipid Panel     Component Value Date/Time   CHOL 147 11/22/2017 0846   CHOL 173 05/24/2013 1049   TRIG 106 11/22/2017 0846   TRIG 185 (H) 05/24/2013 1049   HDL 43 11/22/2017 0846   HDL 39 (L) 05/24/2013 1049   CHOLHDL 3.4 11/22/2017 0846   VLDL 29.6 08/04/2017 1101   LDLCALC 84 11/22/2017 0846   LDLCALC 97 05/24/2013 1049     RADIOLOGY: No results found.  IMPRESSION:  1. CAD in native artery   2. Hx of CABG   3. Hyperlipidemia LDL goal <70   4. Type 2 diabetes mellitus with complication, without long-term current use of insulin ([place])   5. Essential hypertension   6. Medication management     ASSESSMENT AND PLAN: Mr. Mora Appl  is a 59 he does-year-old male who underwent CABG revascularization surgery by Dr. Roxan Hockey in November 2002.  Remotely he had noticed mild shortness of voices, breath and fatigue and never experienced classic substernal chest pressure.  At that time, I attempted to add low-dose isosorbide mononitrate, but unfortunately he developed a significant headache discontinued nitrates. His last nuclear perfusion study was in 2015 which continued to show normal perfusion but he did develop ST segment changes of 1-2 mm.  Amlodipine was added to his medical regimen and since institution he has felt improved.  Most recently he has been on a multidrug regimen for hypertension including amlodipine 5 mg, lisinopril 30 mg, Bystolic 10 mg.  His blood pressure today is mildly elevated but he did not take his morning medication.  However, I have recommended further titration of lisinopril to 40 mg for optimal blood pressure control with target blood pressure less than 130/80.  He is fasting today.  I will repeat chemistry profile as well as lipid studies.  If LDL continues to be above 70 I will add Zetia 10 mg to his medical regimen.  He was recently started on Actos for poorly controlled diabetes.  His hemoglobin A1c had risen to greater than 10.  He is being followed by his primary physician who is managing his diabetes and ultimately his shoulder surgery will most likely be deferred until early 2020.  Since he is 17 years status post CABG revascularization and his last stress test was over 4 years ago, I have recommended he undergo a preoperative exercise Myoview study for preoperative assessment and preoperative clearance.  This will be scheduled for November.  I will see him in December 2019 for follow-up evaluation.  Time spent: 25 minutes.   Troy Sine, MD, [facility]  04/05/2018 8:32 AM

## 2018-04-05 NOTE — Patient Instructions (Signed)
Medication Instructions:  Your physician recommends that you continue on your current medications as directed. Please refer to the Current Medication list given to you today.  Labwork: Today (CMET, Lipid)  Testing/Procedures: Your physician has requested that you have an exercise stress myoview in [place]. For further information please visit [email or web address] Please follow instruction sheet, as given.  Follow-Up: December with Dr. Tresa Endo  Any Other Special Instructions Will Be Listed Below (If Applicable).     If you need a refill on your cardiac medications before your next appointment, please call your pharmacy.

## 2018-04-11 ENCOUNTER — Encounter: Payer: Self-pay | Admitting: Family Medicine

## 2018-04-11 ENCOUNTER — Ambulatory Visit (INDEPENDENT_AMBULATORY_CARE_PROVIDER_SITE_OTHER): Payer: Medicare PPO | Admitting: Family Medicine

## 2018-04-11 VITALS — BP 140/72 | HR 56 | Temp 98.2°F | Wt 208.2 lb

## 2018-04-11 DIAGNOSIS — Z23 Encounter for immunization: Secondary | ICD-10-CM

## 2018-04-11 DIAGNOSIS — E119 Type 2 diabetes mellitus without complications: Secondary | ICD-10-CM | POA: Diagnosis not present

## 2018-04-11 MED ORDER — PIOGLITAZONE HCL 45 MG PO TABS: 45.0000 mg | ORAL_TABLET | Freq: Every day | ORAL | 3 refills | Status: DC

## 2018-04-11 NOTE — Progress Notes (Signed)
   Subjective:    Patient ID: [name], male    DOB: 08/12/1950, 67 y.o.   MRN: [id number]  HPI Here to follow up on diabetes. One month ago we switched from [place] to Actos 30 mg a day in addition to his Metformin and Glipizide. All the side effects from [place] have resolved and he tolerates Actos well. His glucoses have come down a little. His random glucoses the last 2 weeks range from 160 to 278. He saw Dr. Tresa Endo recently and he is planning on setting up a Myoview stress test prior to his anticipated shoulder surgery.     Review of Systems  Constitutional: Negative.   Respiratory: Negative.   Cardiovascular: Negative.   Musculoskeletal: Positive for arthralgias.  Neurological: Negative.        Objective:   Physical Exam  Constitutional: He is oriented to person, place, and time. He appears well-developed and well-nourished.  Cardiovascular: Normal rate, regular rhythm, normal heart sounds and intact distal pulses.  Pulmonary/Chest: Effort normal and breath sounds normal.  Neurological: He is alert and oriented to person, place, and time.          Assessment & Plan:  For the diabetes we will increase the Actos to 45 mg daily. I urged him to tighten up on his diet. Recheck with an A1c in 6 weeks.  Gershon Crane, MD

## 2018-04-17 ENCOUNTER — Other Ambulatory Visit: Payer: Self-pay | Admitting: Family Medicine

## 2018-05-04 ENCOUNTER — Telehealth: Payer: Self-pay | Admitting: Cardiovascular Disease

## 2018-05-04 ENCOUNTER — Telehealth (HOSPITAL_COMMUNITY): Payer: Self-pay

## 2018-05-04 MED ORDER — NITROGLYCERIN 0.4 MG SL SUBL: 0.4000 mg | SUBLINGUAL_TABLET | SUBLINGUAL | 6 refills | Status: DC | PRN

## 2018-05-04 NOTE — Telephone Encounter (Signed)
Returned call to patient's wife.She stated she is concerned about husband.Stated he has been having chest tightness,sob off and on for the past 1 week.Stated he had several episodes of chest tightness and sob yesterday.He rested well last night.No chest tightness at present. Stated he is scheduled for a stress myoview 05/11/18.Spoke to DOD Dr.Harding he advised to move up myoview.Advised ok to call in NTG prescription.Myoview rescheduled to tomorrow 05/05/18 at 7:15 am.NTG prescription sent to pharmacy.Advised to go to ED if he has anymore chest tightness.

## 2018-05-04 NOTE — Telephone Encounter (Signed)
New Message   Pt c/o of Chest Pain: STAT if CP now or developed within 24 hours  1. Are you having CP right now? No   2. Are you experiencing any other symptoms (ex. SOB, nausea, vomiting, sweating)? Shortness of breath  3. How long have you been experiencing CP? Since Monday   4. Is your CP continuous or coming and going? On and off   5. Have you taken Nitroglycerin? No  ?

## 2018-05-04 NOTE — Telephone Encounter (Signed)
Encounter complete. 

## 2018-05-05 ENCOUNTER — Ambulatory Visit (HOSPITAL_COMMUNITY)
Admission: RE | Admit: 2018-05-05 | Discharge: 2018-05-05 | Disposition: A | Discharge status: Home / Self Care | Payer: Medicare PPO | Source: Ambulatory Visit | Attending: Cardiology | Admitting: Cardiology

## 2018-05-05 DIAGNOSIS — Z951 Presence of aortocoronary bypass graft: Secondary | ICD-10-CM | POA: Diagnosis not present

## 2018-05-05 DIAGNOSIS — I2510 Atherosclerotic heart disease of native coronary artery without angina pectoris: Secondary | ICD-10-CM | POA: Insufficient documentation

## 2018-05-05 LAB — MYOCARDIAL PERFUSION IMAGING
CHL CUP MPHR: 152 {beats}/min
CHL CUP RESTING HR STRESS: 57 {beats}/min
CHL RATE OF PERCEIVED EXERTION: 18
CSEPEDS: 23 s
CSEPEW: 10.1 METS
CSEPHR: 86 %
Exercise duration (min): 9 min
LV dias vol: 159 mL (ref 62–150)
LVSYSVOL: 83 mL
NUC STRESS TID: 1.43
Peak HR: 131 {beats}/min
SDS: 4
SRS: 1
SSS: 5

## 2018-05-05 MED ORDER — TECHNETIUM TC 99M TETROFOSMIN IV KIT
30.2000 | PACK | Freq: Once | INTRAVENOUS | Status: DC | PRN
  Filled 2018-05-05: qty 31

## 2018-05-05 MED ORDER — TECHNETIUM TC 99M TETROFOSMIN IV KIT
10.6000 | PACK | Freq: Once | INTRAVENOUS | Status: DC | PRN
  Filled 2018-05-05: qty 11

## 2018-05-05 NOTE — Telephone Encounter (Signed)
acknowledged

## 2018-05-10 ENCOUNTER — Encounter: Payer: Self-pay | Admitting: Family Medicine

## 2018-05-10 ENCOUNTER — Ambulatory Visit (INDEPENDENT_AMBULATORY_CARE_PROVIDER_SITE_OTHER): Payer: Medicare PPO | Admitting: Family Medicine

## 2018-05-10 VITALS — BP 140/82 | HR 56 | Temp 97.8°F | Wt 211.2 lb

## 2018-05-10 DIAGNOSIS — M159 Polyosteoarthritis, unspecified: Secondary | ICD-10-CM

## 2018-05-10 DIAGNOSIS — M150 Primary generalized (osteo)arthritis: Secondary | ICD-10-CM | POA: Diagnosis not present

## 2018-05-10 DIAGNOSIS — E119 Type 2 diabetes mellitus without complications: Secondary | ICD-10-CM | POA: Diagnosis not present

## 2018-05-10 DIAGNOSIS — F1190 Opioid use, unspecified, uncomplicated: Secondary | ICD-10-CM

## 2018-05-10 DIAGNOSIS — M8949 Other hypertrophic osteoarthropathy, multiple sites: Secondary | ICD-10-CM

## 2018-05-10 LAB — HEMOGLOBIN A1C: Hgb A1c MFr Bld: 8.4 % — ABNORMAL HIGH (ref 4.6–6.5)

## 2018-05-10 MED ORDER — HYDROCODONE-ACETAMINOPHEN 10-325 MG PO TABS: 1.0000 | ORAL_TABLET | Freq: Four times a day (QID) | ORAL | 0 refills | Status: DC | PRN

## 2018-05-10 NOTE — Progress Notes (Signed)
   Subjective:    Patient ID: [name], male    DOB: 04-30-1951, 67 y.o.   MRN: [id number]  HPI Here for pain management. He is doing well.  Indication for chronic opioid: osteoarthritis Medication and dose: Norco 10-325 # pills per month: 120 Last UDS date: 07-30-17 Opioid Treatment Agreement signed (Y/N): 11-01-17 Opioid Treatment Agreement last reviewed with patient:  05-10-18 N[facility]SRS reviewed this encounter (include red flags):  05-10-18    Review of Systems  Constitutional: Negative.   Respiratory: Negative.   Cardiovascular: Negative.   Musculoskeletal: Positive for arthralgias.  Neurological: Negative.        Objective:   Physical Exam  Constitutional: He is oriented to person, place, and time. He appears well-developed and well-nourished.  Cardiovascular: Normal rate, regular rhythm, normal heart sounds and intact distal pulses.  Pulmonary/Chest: Effort normal and breath sounds normal.  Neurological: He is alert and oriented to person, place, and time.          Assessment & Plan:  Pain management, meds were refilled.  Gershon Crane, MD

## 2018-05-11 ENCOUNTER — Inpatient Hospital Stay (HOSPITAL_COMMUNITY): Admission: RE | Admit: 2018-05-11 | Payer: Medicare PPO | Source: Ambulatory Visit

## 2018-05-16 ENCOUNTER — Encounter: Payer: Self-pay | Admitting: *Deleted

## 2018-05-18 ENCOUNTER — Telehealth: Payer: Self-pay | Admitting: Cardiovascular Disease

## 2018-05-18 NOTE — Telephone Encounter (Signed)
Received call from patient's wife.She stated husband has been having intermittent chest pain for the past 2 weeks.Stated he has not told her.She found out yesterday and she is concerned.Stated he has been taking NTG with relief.She would like him to be seen.Appointment scheduled with Azalee CourseHao Meng PA 05/19/18 at 10:30 am.Advised to go to ED if needed.

## 2018-05-19 ENCOUNTER — Ambulatory Visit (INDEPENDENT_AMBULATORY_CARE_PROVIDER_SITE_OTHER): Payer: Medicare PPO | Admitting: Physician Assistant

## 2018-05-19 ENCOUNTER — Encounter: Payer: Self-pay | Admitting: Physician Assistant

## 2018-05-19 ENCOUNTER — Other Ambulatory Visit: Payer: Self-pay | Admitting: Cardiovascular Disease

## 2018-05-19 VITALS — BP 152/80 | HR 64 | Ht 69.5 in | Wt 214.0 lb

## 2018-05-19 DIAGNOSIS — E119 Type 2 diabetes mellitus without complications: Secondary | ICD-10-CM

## 2018-05-19 DIAGNOSIS — E785 Hyperlipidemia, unspecified: Secondary | ICD-10-CM

## 2018-05-19 DIAGNOSIS — I25709 Atherosclerosis of coronary artery bypass graft(s), unspecified, with unspecified angina pectoris: Secondary | ICD-10-CM

## 2018-05-19 DIAGNOSIS — I10 Essential (primary) hypertension: Secondary | ICD-10-CM | POA: Diagnosis not present

## 2018-05-19 DIAGNOSIS — R072 Precordial pain: Secondary | ICD-10-CM

## 2018-05-19 MED ORDER — ISOSORBIDE MONONITRATE ER 30 MG PO TB24: 15.0000 mg | ORAL_TABLET | Freq: Every day | ORAL | 0 refills | Status: DC

## 2018-05-19 NOTE — Patient Instructions (Signed)
Medication Instructions:  START Imdur 15mg  once a day (imdur comes in 30mg  tablet break tablet in half and take half tablet once a day) If you need a refill on your cardiac medications before your next appointment, please call your pharmacy.   Lab work: None  If you have labs (blood work) drawn today and your tests are completely normal, you will receive your results only by: Marland Kitchen. MyChart Message (if you have MyChart) OR . A paper copy in the mail If you have any lab test that is abnormal or we need to change your treatment, we will call you to review the results.  Testing/Procedures: Your physician has requested that you have an echocardiogram. Echocardiography is a painless test that uses sound waves to create images of your heart. It provides your doctor with information about the size and shape of your heart and how well your heart's chambers and valves are working. This procedure takes approximately one hour. There are no restrictions for this procedure. [address]  Follow-Up: At [facility], you and your health needs are our priority.  As part of our continuing mission to provide you with exceptional heart care, we have created designated Provider Care Teams.  These Care Teams include your primary Cardiologist (physician) and Advanced Practice Providers (APPs -  Physician Assistants and Nurse Practitioners) who all work together to provide you with the care you need, when you need it. . Your physician recommends that you schedule a follow-up appointment in: 2 WEEKS WITH Wynema BirchHao and Keep December appt with Dr Tresa EndoKelly  Any Other Special Instructions Will Be Listed Below (If Applicable).

## 2018-05-19 NOTE — Progress Notes (Signed)
Cardiology Office Note    Date:  05/21/2018   ID:  Thomas, Bennett 20-Jun-1951, MRN [id number]  PCP:  Nelwyn Salisbury, MD  Cardiologist:  Dr. Tresa Endo   Chief Complaint  Patient presents with  . Follow-up    seen for Dr. Tresa Endo.     History of Present Illness:  [name] is a 67 y.o. male with PMH of HTN, HLD, DM II and CAD s/p CABG with sequential LIMA-D2-LAD, sequential radial graft-D1-D3. Prior to the bypass surgery he has never experienced chest pain but demonstrated exertional shortness of breath and arm discomfort.  Echocardiogram obtained on 06/14/2014 showed EF 55 to 60%, grade 2 DD, mild MR, moderate LAE.  Patient was recently seen by Dr. Tresa Endo on 04/05/2018, at which time a stress test was ordered in anticipation of possible shoulder surgery in early 2020.  Myoview obtained on 05/05/2018 showed EF 48%, overall low risk study, horizontal ST depression of 1.5 mm and was noted diffusely, balanced ischemia cannot be entirely excluded.  Patient presents today for evaluation of chest discomfort.  For the past several weeks, he started having chest discomfort.  This is new for him he never had chest pain prior to the bypass surgery.  He also did notice some shortness of breath and fatigue.  At the same time he complained of a productive cough.  Patient admits to have prior history of black lung related to his previous work with [employer].  Given the recent Myoview, I would recommend an echocardiogram to reassess the ejection fraction and wall motion.  Since there was no obvious focal ischemia on the Myoview, I plan to proceed with medical therapy on 15 mg daily of Imdur.  I am aware that he has a history of headache associated with the Imdur, however he is willing to try a low dose.  I will bring him back in 2 weeks for reassessment, if his symptoms worsens I plan to proceed with cardiac catheterization.  Past Medical History:  Diagnosis Date  . CAD (coronary artery  disease)    nuclear stress test-09/09/11 low risk scan EF61%, sees Dr. Tresa Endo   . Chronic headache   . Chronic neck pain   . Diabetes mellitus   . Hyperlipidemia   . Hypertension 12/26/08   ECHO- EF>55%  . Insomnia     Past Surgical History:  Procedure Laterality Date  . APPENDECTOMY    . cervical spine injection    . COLONOSCOPY  04-02-06    per [facility], clear, repeat in 10 yrs  . CORONARY ARTERY BYPASS GRAFT  2002   4 vessels    Current Medications: Outpatient Medications Prior to Visit  Medication Sig Dispense Refill  . ACCU-CHEK AVIVA PLUS test strip TEST SUGAR ONCE A DAY AND DIAGNOSIS CODE IS E 11.9 100 each 1  . aspirin 81 MG tablet Take 81 mg by mouth daily.     . Calcium Carbonate-Vitamin D (CALCIUM 600+D PO) Take 1 tablet by mouth daily.    . clonazePAM (KLONOPIN) 0.5 MG tablet TAKE 1 TABLET (0.5 MG TOTAL) BY MOUTH 2 (TWO) TIMES DAILY AS NEEDED FOR ANXIETY. 60 tablet 5  . Coenzyme Q10 (COQ10) 100 MG CAPS Take 100 mg by mouth daily.     . fexofenadine (ALLEGRA) 180 MG tablet Take 180 mg by mouth daily as needed for allergies or rhinitis.    Marland Kitchen gabapentin (NEURONTIN) 300 MG capsule TAKE 3 CAPSULES BY MOUTH AT BEDTIME 270 capsule 0  . [  START ON 07/10/2018] HYDROcodone-acetaminophen (NORCO) 10-325 MG tablet Take 1 tablet by mouth every 6 (six) hours as needed for moderate pain. 120 tablet 0  . JANUVIA 100 MG tablet TAKE 1 TABLET BY MOUTH EVERY DAY 90 tablet 3  . Lancets MISC Fast click lancet accu check may substitute for whever pt has 100 each 3  . lisinopril (PRINIVIL,ZESTRIL) 40 MG tablet Take 1 tablet (40 mg total) by mouth daily. 90 tablet 3  . meloxicam (MOBIC) 15 MG tablet TAKE 1 TABLET BY MOUTH EVERY DAY 90 tablet 3  . Menthol, Topical Analgesic, (BENGAY EX) Apply 1 application topically daily as needed (pain).    . metFORMIN (GLUCOPHAGE) 1000 MG tablet TAKE 1 TABLET BY MOUTH TWICE A DAY 180 tablet 3  . nebivolol (BYSTOLIC) 10 MG tablet Take 1 tablet (10 mg total) by  mouth daily. 30 tablet 11  . nitroGLYCERIN (NITROSTAT) 0.4 MG SL tablet Place 1 tablet (0.4 mg total) under the tongue every 5 (five) minutes as needed for chest pain. 25 tablet 6  . Omega-3 350 MG CPDR Take 350 mg by mouth daily.    . pioglitazone (ACTOS) 45 MG tablet Take 1 tablet (45 mg total) by mouth daily. 90 tablet 3  . Pitavastatin Calcium (LIVALO) 4 MG TABS Take 1 tablet (4 mg total) by mouth daily. 90 tablet 3  . sulfamethoxazole-trimethoprim (BACTRIM DS,SEPTRA DS) 800-160 MG tablet Take 1 tablet by mouth 2 (two) times daily as needed (bladder infections).    Marland Kitchen. amLODipine (NORVASC) 5 MG tablet TAKE 1 TABLET BY MOUTH EVERY DAY 90 tablet 0   No facility-administered medications prior to visit.      Allergies:   Codeine; Crestor [rosuvastatin]; Isosorbide nitrate; Lipitor [atorvastatin]; Zolpidem tartrate; and Ciprofloxacin   Social History   Socioeconomic History  . Marital status: Married    Spouse name: Not on file  . Number of children: Not on file  . Years of education: Not on file  . Highest education level: Not on file  Occupational History  . Not on file  Social Needs  . Financial resource strain: Not on file  . Food insecurity:    Worry: Not on file    Inability: Not on file  . Transportation needs:    Medical: Not on file    Non-medical: Not on file  Tobacco Use  . Smoking status: Former Smoker    Last attempt to quit: 11/26/1970    Years since quitting: 47.5  . Smokeless tobacco: Never Used  Substance and Sexual Activity  . Alcohol use: No    Alcohol/week: 0.0 standard drinks  . Drug use: No  . Sexual activity: Not on file  Lifestyle  . Physical activity:    Days per week: Not on file    Minutes per session: Not on file  . Stress: Not on file  Relationships  . Social connections:    Talks on phone: Not on file    Gets together: Not on file    Attends religious service: Not on file    Active member of club or organization: Not on file    Attends  meetings of clubs or organizations: Not on file    Relationship status: Not on file  Other Topics Concern  . Not on file  Social History Narrative  . Not on file     Family History:  The patient's family history includes Cancer in his brother, brother, and brother; Coronary artery disease in his unknown relative; Heart disease  in his mother; Hypertension in his unknown relative; Liver cancer in his father.   ROS:   Please see the history of present illness.    ROS All other systems reviewed and are negative.   PHYSICAL EXAM:   VS:  BP (!) 152/80   Pulse 64   Ht 5' 9.5" (1.765 m)   Wt 214 lb (97.1 kg)   BMI 31.15 kg/m    GEN: Well nourished, well developed, in no acute distress  HEENT: normal  Neck: no JVD, carotid bruits, or masses Cardiac: RRR; no murmurs, rubs, or gallops,no edema  Respiratory:  clear to auscultation bilaterally, normal work of breathing GI: soft, nontender, nondistended, + BS MS: no deformity or atrophy  Skin: warm and dry, no rash Neuro:  Alert and Oriented x 3, Strength and sensation are intact Psych: euthymic mood, full affect  Wt Readings from Last 3 Encounters:  05/19/18 214 lb (97.1 kg)  05/10/18 211 lb 4 oz (95.8 kg)  05/05/18 205 lb (93 kg)      Studies/Labs Reviewed:   EKG:  EKG is ordered today.  The ekg ordered today demonstrates NSR without significant ST-T wave changes  Recent Labs: 08/04/2017: TSH 2.78 02/18/2018: Hemoglobin 13.9; Platelets 166 04/05/2018: ALT 24; BUN 11; Creatinine, Ser 0.93; Potassium 3.8; Sodium 143   Lipid Panel    Component Value Date/Time   CHOL 153 04/05/2018 0832   CHOL 173 05/24/2013 1049   TRIG 81 04/05/2018 0832   TRIG 185 (H) 05/24/2013 1049   HDL 49 04/05/2018 0832   HDL 39 (L) 05/24/2013 1049   CHOLHDL 3.1 04/05/2018 0832   CHOLHDL 3.4 11/22/2017 0846   VLDL 29.6 08/04/2017 1101   LDLCALC 88 04/05/2018 0832   LDLCALC 84 11/22/2017 0846   LDLCALC 97 05/24/2013 1049   LDLDIRECT 128.0 10/29/2011  1028    Additional studies/ records that were reviewed today include:   Echo 06/14/2014 LV EF: 55% -  60% Study Conclusions  - Left ventricle: The cavity size was normal. There was mild focal basal hypertrophy of the septum. Systolic function was normal. The estimated ejection fraction was in the range of 55% to 60%. Doppler parameters are consistent with abnormal left ventricular relaxation (grade 1 diastolic dysfunction). - Mitral valve: There was mild regurgitation. - Left atrium: The atrium was moderately dilated.  Impressions:  - Normal LV function; grade 1 diastolic dysfunction; moderate LAE; mild MR; trace TR.   Myoview 05/05/2018  The left ventricular ejection fraction is mildly decreased (45-54%).  Nuclear stress EF: 48%.  The study is normal.  This is a low risk study.  Horizontal ST segment depression ST segment depression of 1.5 mm was noted during stress in the V2, V4, V5, V6, aVF, II, V3 and III leads, and returning to baseline after 5-9 minutes of recovery.   Low risk stress nuclear study with normal perfusion and mildly reduced left ventricular global systolic function. Marked ECG changes are seen during exercise and "balanced ischemia" cannot be entirely excluded. Suggest comparison of LV EF with echocardiogram.  ASSESSMENT:    1. Precordial pain   2. Coronary artery disease involving coronary bypass graft of native heart with angina pectoris (HCC)   3. Essential hypertension   4. Hyperlipidemia, unspecified hyperlipidemia type   5. Controlled type 2 diabetes mellitus without complication, without long-term current use of insulin (HCC)      PLAN:  In order of problems listed above:  1. Precordial pain: He has been noticing some intermittent  chest discomfort and shortness of breath.  Recently, he also had a productive cough as well.  I am not entirely sure if his chest pain is cardiac or pulmonary.  He just had a Myoview on 05/05/2018,  perfusion portion was normal, however could not exclude balanced ischemia especially with marked EKG changes of 1.5 mm ST depression.  We discussed various options, I plan to proceed with a trial of medical therapy on the Imdur.  I am aware that he has a history of headache with Imdur, I will start very low-dose 15 mg daily.  We will bring the patient back in a few weeks, if his symptoms still persist, I would recommend a cardiac catheterization  2. CAD s/p CABG: See #1.  On aspirin and pitavastatin  3. Hypertension: Blood pressure mildly elevated today, however normally it is quite well controlled at home.  4. Hyperlipidemia: On pitavastatin  5. DM2: Managed by primary care provider.    Medication Adjustments/Labs and Tests Ordered: Current medicines are reviewed at length with the patient today.  Concerns regarding medicines are outlined above.  Medication changes, Labs and Tests ordered today are listed in the Patient Instructions below. Patient Instructions  Medication Instructions:  START Imdur 15mg  once a day (imdur comes in 30mg  tablet break tablet in half and take half tablet once a day) If you need a refill on your cardiac medications before your next appointment, please call your pharmacy.   Lab work: None  If you have labs (blood work) drawn today and your tests are completely normal, you will receive your results only by: Marland Kitchen MyChart Message (if you have MyChart) OR . A paper copy in the mail If you have any lab test that is abnormal or we need to change your treatment, we will call you to review the results.  Testing/Procedures: Your physician has requested that you have an echocardiogram. Echocardiography is a painless test that uses sound waves to create images of your heart. It provides your doctor with information about the size and shape of your heart and how well your heart's chambers and valves are working. This procedure takes approximately one hour. There are no  restrictions for this procedure. [address]  Follow-Up: At [facility], you and your health needs are our priority.  As part of our continuing mission to provide you with exceptional heart care, we have created designated Provider Care Teams.  These Care Teams include your primary Cardiologist (physician) and Advanced Practice Providers (APPs -  Physician Assistants and Nurse Practitioners) who all work together to provide you with the care you need, when you need it. . Your physician recommends that you schedule a follow-up appointment in: 2 WEEKS WITH Wynema Birch and Keep December appt with Dr Tresa Endo  Any Other Special Instructions Will Be Listed Below (If Applicable).      Ramond Dial, [place]  05/21/2018 11:21 PM    [facility] [address] [place], [place], [place]  [address] Phone: [phone]; Fax: [phone]

## 2018-05-21 ENCOUNTER — Encounter: Payer: Self-pay | Admitting: Physician Assistant

## 2018-05-23 ENCOUNTER — Telehealth: Payer: Self-pay

## 2018-05-23 NOTE — Telephone Encounter (Signed)
Spoke with patients wife informed her that I canceled appt scheduled on 05/31/2018 because I had already scheduled patient an appt on 05/30/2018 with Wynema BirchHao and Dr Tresa EndoKelly [facility] the [facility]. Wife voiced understanding.

## 2018-05-30 ENCOUNTER — Ambulatory Visit (INDEPENDENT_AMBULATORY_CARE_PROVIDER_SITE_OTHER): Payer: Medicare PPO | Admitting: Physician Assistant

## 2018-05-30 ENCOUNTER — Encounter: Payer: Self-pay | Admitting: Physician Assistant

## 2018-05-30 ENCOUNTER — Ambulatory Visit (HOSPITAL_COMMUNITY): Payer: Medicare PPO | Attending: Internal Medicine

## 2018-05-30 VITALS — BP 138/70 | HR 64 | Ht 69.5 in | Wt 213.4 lb

## 2018-05-30 DIAGNOSIS — R072 Precordial pain: Secondary | ICD-10-CM | POA: Diagnosis not present

## 2018-05-30 DIAGNOSIS — I25810 Atherosclerosis of coronary artery bypass graft(s) without angina pectoris: Secondary | ICD-10-CM | POA: Diagnosis not present

## 2018-05-30 DIAGNOSIS — E119 Type 2 diabetes mellitus without complications: Secondary | ICD-10-CM | POA: Diagnosis not present

## 2018-05-30 DIAGNOSIS — E785 Hyperlipidemia, unspecified: Secondary | ICD-10-CM | POA: Diagnosis not present

## 2018-05-30 DIAGNOSIS — I10 Essential (primary) hypertension: Secondary | ICD-10-CM

## 2018-05-30 LAB — ECHOCARDIOGRAM COMPLETE
HEIGHTINCHES: 69.5 in
WEIGHTICAEL: 3414.4 [oz_av]

## 2018-05-30 NOTE — Progress Notes (Signed)
Cardiology Office Note    Date:  05/30/2018   ID:  [name], [name] 1950-12-07, MRN [id number]  PCP:  Nelwyn Salisbury, MD  Cardiologist:  Dr. Tresa Endo   Chief Complaint  Patient presents with  . Follow-up    seen for Dr. Tresa Endo.     History of Present Illness:  [name] is a 67 y.o. male  with PMH of HTN, HLD, DM II and CAD s/p CABG with sequential LIMA-D2-LAD, sequential radial graft-D1-D3. Prior to the bypass surgery he has never experienced chest pain but demonstrated exertional shortness of breath and arm discomfort.  Echocardiogram obtained on 06/14/2014 showed EF 55 to 60%, grade 2 DD, mild MR, moderate LAE.  Patient was recently seen by Dr. Tresa Endo on 04/05/2018, at which time a stress test was ordered in anticipation of possible shoulder surgery in early 2020.  Myoview obtained on 05/05/2018 showed EF 48%, overall low risk study, horizontal ST depression of 1.5 mm and was noted diffusely, balanced ischemia cannot be entirely excluded.  I last saw the patient on 05/19/2018 for evaluation of chest pain.  He has never experienced this type of chest pain even prior to the bypass surgery.  At the same time, he complained of a productive cough.  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  Given the normal Myoview, I recommended an echocardiogram to assess ejection fraction and wall motion.  I also added 15 mg daily of Imdur for antianginal purposes.  Patient presents today for cardiology office visit.  His chest pain has completely resolved.  For the past week, he is able to walk up to a mile while walking with his dog without any issue.  He does not have any lower extremity edema, orthopnea or PND.  At this time I will wait for the echo to resolved.  He does had echocardiogram this morning.  If echocardiogram is normal, I would not recommend any further work-up.   Past Medical History:  Diagnosis Date  . CAD (coronary artery  disease)    nuclear stress test-09/09/11 low risk scan EF61%, sees Dr. Tresa Endo   . Chronic headache   . Chronic neck pain   . Diabetes mellitus   . Hyperlipidemia   . Hypertension 12/26/08   ECHO- EF>55%  . Insomnia     Past Surgical History:  Procedure Laterality Date  . APPENDECTOMY    . cervical spine injection    . COLONOSCOPY  04-02-06    per [facility], clear, repeat in 10 yrs  . CORONARY ARTERY BYPASS GRAFT  2002   4 vessels    Current Medications: Outpatient Medications Prior to Visit  Medication Sig Dispense Refill  . ACCU-CHEK AVIVA PLUS test strip TEST SUGAR ONCE A DAY AND DIAGNOSIS CODE IS E 11.9 100 each 1  . amLODipine (NORVASC) 5 MG tablet TAKE 1 TABLET BY MOUTH EVERY DAY 90 tablet 0  . aspirin 81 MG tablet Take 81 mg by mouth daily.     . Calcium Carbonate-Vitamin D (CALCIUM 600+D PO) Take 1 tablet by mouth daily.    . clonazePAM (KLONOPIN) 0.5 MG tablet TAKE 1 TABLET (0.5 MG TOTAL) BY MOUTH 2 (TWO) TIMES DAILY AS NEEDED FOR ANXIETY. 60 tablet 5  . Coenzyme Q10 (COQ10) 100 MG CAPS Take 100 mg by mouth daily.     . fexofenadine (ALLEGRA) 180 MG tablet Take 180 mg by mouth daily as needed for allergies or  rhinitis.    Marland Kitchen gabapentin (NEURONTIN) 300 MG capsule TAKE 3 CAPSULES BY MOUTH AT BEDTIME 270 capsule 0  . [START ON 07/10/2018] HYDROcodone-acetaminophen (NORCO) 10-325 MG tablet Take 1 tablet by mouth every 6 (six) hours as needed for moderate pain. 120 tablet 0  . JANUVIA 100 MG tablet TAKE 1 TABLET BY MOUTH EVERY DAY 90 tablet 3  . Lancets MISC Fast click lancet accu check may substitute for whever pt has 100 each 3  . lisinopril (PRINIVIL,ZESTRIL) 40 MG tablet Take 1 tablet (40 mg total) by mouth daily. 90 tablet 3  . meloxicam (MOBIC) 15 MG tablet TAKE 1 TABLET BY MOUTH EVERY DAY 90 tablet 3  . metFORMIN (GLUCOPHAGE) 1000 MG tablet TAKE 1 TABLET BY MOUTH TWICE A DAY 180 tablet 3  . nebivolol (BYSTOLIC) 10 MG tablet Take 1 tablet (10 mg total) by mouth daily. 30  tablet 11  . nitroGLYCERIN (NITROSTAT) 0.4 MG SL tablet Place 1 tablet (0.4 mg total) under the tongue every 5 (five) minutes as needed for chest pain. 25 tablet 6  . Omega-3 350 MG CPDR Take 350 mg by mouth daily.    . pioglitazone (ACTOS) 45 MG tablet Take 1 tablet (45 mg total) by mouth daily. 90 tablet 3  . Pitavastatin Calcium (LIVALO) 4 MG TABS Take 1 tablet (4 mg total) by mouth daily. 90 tablet 3  . sulfamethoxazole-trimethoprim (BACTRIM DS,SEPTRA DS) 800-160 MG tablet Take 1 tablet by mouth 2 (two) times daily as needed (bladder infections).    Marland Kitchen lisinopril (PRINIVIL,ZESTRIL) 20 MG tablet TAKE 1 AND 1/2 TABLETS BY MOUTH DAILY 135 tablet 0  . isosorbide mononitrate (IMDUR) 30 MG 24 hr tablet Take 0.5 tablets (15 mg total) by mouth daily. (Patient not taking: Reported on 05/30/2018) 15 tablet 0  . Menthol, Topical Analgesic, (BENGAY EX) Apply 1 application topically daily as needed (pain).     No facility-administered medications prior to visit.      Allergies:   Codeine; Crestor [rosuvastatin]; Isosorbide nitrate; Lipitor [atorvastatin]; Zolpidem tartrate; and Ciprofloxacin   Social History   Socioeconomic History  . Marital status: Married    Spouse name: Not on file  . Number of children: Not on file  . Years of education: Not on file  . Highest education level: Not on file  Occupational History  . Not on file  Social Needs  . Financial resource strain: Not on file  . Food insecurity:    Worry: Not on file    Inability: Not on file  . Transportation needs:    Medical: Not on file    Non-medical: Not on file  Tobacco Use  . Smoking status: Former Smoker    Last attempt to quit: 11/26/1970    Years since quitting: 47.5  . Smokeless tobacco: Never Used  Substance and Sexual Activity  . Alcohol use: No    Alcohol/week: 0.0 standard drinks  . Drug use: No  . Sexual activity: Not on file  Lifestyle  . Physical activity:    Days per week: Not on file    Minutes per  session: Not on file  . Stress: Not on file  Relationships  . Social connections:    Talks on phone: Not on file    Gets together: Not on file    Attends religious service: Not on file    Active member of club or organization: Not on file    Attends meetings of clubs or organizations: Not on file  Relationship status: Not on file  Other Topics Concern  . Not on file  Social History Narrative  . Not on file     Family History:  The patient's family history includes Cancer in his brother, brother, and brother; Coronary artery disease in his unknown relative; Heart disease in his mother; Hypertension in his unknown relative; Liver cancer in his father.   ROS:   Please see the history of present illness.    ROS All other systems reviewed and are negative.   PHYSICAL EXAM:   VS:  BP 138/70   Pulse 64   Ht 5' 9.5" (1.765 m)   Wt 213 lb 6.4 oz (96.8 kg)   SpO2 98%   BMI 31.06 kg/m    GEN: Well nourished, well developed, in no acute distress  HEENT: normal  Neck: no JVD, carotid bruits, or masses Cardiac: RRR; no murmurs, rubs, or gallops,no edema  Respiratory:  clear to auscultation bilaterally, normal work of breathing GI: soft, nontender, nondistended, + BS MS: no deformity or atrophy  Skin: warm and dry, no rash Neuro:  Alert and Oriented x 3, Strength and sensation are intact Psych: euthymic mood, full affect  Wt Readings from Last 3 Encounters:  05/30/18 213 lb 6.4 oz (96.8 kg)  05/19/18 214 lb (97.1 kg)  05/10/18 211 lb 4 oz (95.8 kg)      Studies/Labs Reviewed:   EKG:  EKG is not ordered today.    Recent Labs: 08/04/2017: TSH 2.78 02/18/2018: Hemoglobin 13.9; Platelets 166 04/05/2018: ALT 24; BUN 11; Creatinine, Ser 0.93; Potassium 3.8; Sodium 143   Lipid Panel    Component Value Date/Time   CHOL 153 04/05/2018 0832   CHOL 173 05/24/2013 1049   TRIG 81 04/05/2018 0832   TRIG 185 (H) 05/24/2013 1049   HDL 49 04/05/2018 0832   HDL 39 (L) 05/24/2013 1049     CHOLHDL 3.1 04/05/2018 0832   CHOLHDL 3.4 11/22/2017 0846   VLDL 29.6 08/04/2017 1101   LDLCALC 88 04/05/2018 0832   LDLCALC 84 11/22/2017 0846   LDLCALC 97 05/24/2013 1049   LDLDIRECT 128.0 10/29/2011 1028    Additional studies/ records that were reviewed today include:   Myoview 05/05/2018  The left ventricular ejection fraction is mildly decreased (45-54%).  Nuclear stress EF: 48%.  The study is normal.  This is a low risk study.  Horizontal ST segment depression ST segment depression of 1.5 mm was noted during stress in the V2, V4, V5, V6, aVF, II, V3 and III leads, and returning to baseline after 5-9 minutes of recovery.   Low risk stress nuclear study with normal perfusion and mildly reduced left ventricular global systolic function. Marked ECG changes are seen during exercise and "balanced ischemia" cannot be entirely excluded. Suggest comparison of LV EF with echocardiogram.    ASSESSMENT:    1. Coronary artery disease involving coronary bypass graft of native heart without angina pectoris   2. Essential hypertension   3. Hyperlipidemia LDL goal <70   4. Controlled type 2 diabetes mellitus without complication, without long-term current use of insulin (HCC)      PLAN:  In order of problems listed above:  1. CAD s/p CABG: Patient has a history of bypass surgery, recently, he presented with chest pain.  This is a very unusual symptom for him as he never had chest discomfort even prior to the bypass surgery.  His anginal symptom has always been dyspnea with exertion and arm pain.  I added  Imdur 15 mg daily to his medical regimen, however he has not been taking this medication.  Given the recent low risk Myoview on 05/05/2018, I recommend to continue observation.  He returns today for follow-up, his chest pain has resolved.  He is able to walk up to a mile while walking his dog without any issue.  2. Hypertension: His lisinopril was recently increased from 30 mg  daily to 40 mg daily, blood pressure is quite well controlled on current therapy.  Note, the Imdur 15 mg daily that I gave him during the last office visit has not been started as his chest discomfort has improved.  3. Hyperlipidemia: On pitavastatin.  Last lipid panel obtained on 04/05/2018 showed a total cholesterol 153, HDL 49, LDL 88, triglyceride 81.  His LDL was borderline high, would recommend an exercise for now, and potentially consider Zetia if repeat blood work continue to show LDL greater than 70.  4. DM2: Managed by primary care provider.    Medication Adjustments/Labs and Tests Ordered: Current medicines are reviewed at length with the patient today.  Concerns regarding medicines are outlined above.  Medication changes, Labs and Tests ordered today are listed in the Patient Instructions below. Patient Instructions  Medication Instructions:  Your physician recommends that you continue on your current medications as directed. Please refer to the Current Medication list given to you today.  If you need a refill on your cardiac medications before your next appointment, please call your pharmacy.   Lab work: None ordered If you have labs (blood work) drawn today and your tests are completely normal, you will receive your results only by: Marland Kitchen MyChart Message (if you have MyChart) OR . A paper copy in the mail If you have any lab test that is abnormal or we need to change your treatment, we will call you to review the results.  Testing/Procedures: None ordered  Follow-Up: Your physician recommends that you schedule a follow-up appointment in: KEEP YOUR SCHEDULED APPT WITH DR. Tresa Endo  Any Other Special Instructions Will Be Listed Below (If Applicable).       Ramond Dial, [place]  05/30/2018 1:52 PM    [facility] [address] [place], [place], [place]  [address] Phone: [phone]; Fax: [phone]

## 2018-05-30 NOTE — Patient Instructions (Signed)
Medication Instructions:  Your physician recommends that you continue on your current medications as directed. Please refer to the Current Medication list given to you today.  If you need a refill on your cardiac medications before your next appointment, please call your pharmacy.   Lab work: None ordered If you have labs (blood work) drawn today and your tests are completely normal, you will receive your results only by: Marland Kitchen. MyChart Message (if you have MyChart) OR . A paper copy in the mail If you have any lab test that is abnormal or we need to change your treatment, we will call you to review the results.  Testing/Procedures: None ordered  Follow-Up: Your physician recommends that you schedule a follow-up appointment in: KEEP YOUR SCHEDULED APPT WITH DR. Tresa EndoKELLY  Any Other Special Instructions Will Be Listed Below (If Applicable).

## 2018-05-31 ENCOUNTER — Ambulatory Visit: Payer: Medicare PPO | Admitting: Physician Assistant

## 2018-06-08 ENCOUNTER — Ambulatory Visit (INDEPENDENT_AMBULATORY_CARE_PROVIDER_SITE_OTHER): Payer: Medicare PPO | Admitting: Orthopedic Surgery

## 2018-06-10 ENCOUNTER — Other Ambulatory Visit: Payer: Self-pay | Admitting: Physician Assistant

## 2018-06-14 ENCOUNTER — Encounter: Payer: Self-pay | Admitting: Cardiovascular Disease

## 2018-06-14 ENCOUNTER — Ambulatory Visit (INDEPENDENT_AMBULATORY_CARE_PROVIDER_SITE_OTHER): Payer: Medicare PPO | Admitting: Cardiovascular Disease

## 2018-06-14 VITALS — BP 120/68 | HR 61 | Ht 69.5 in | Wt 214.2 lb

## 2018-06-14 DIAGNOSIS — Z951 Presence of aortocoronary bypass graft: Secondary | ICD-10-CM | POA: Diagnosis not present

## 2018-06-14 DIAGNOSIS — I10 Essential (primary) hypertension: Secondary | ICD-10-CM

## 2018-06-14 DIAGNOSIS — E785 Hyperlipidemia, unspecified: Secondary | ICD-10-CM | POA: Diagnosis not present

## 2018-06-14 DIAGNOSIS — J60 Coalworker's pneumoconiosis: Secondary | ICD-10-CM | POA: Diagnosis not present

## 2018-06-14 DIAGNOSIS — I25810 Atherosclerosis of coronary artery bypass graft(s) without angina pectoris: Secondary | ICD-10-CM

## 2018-06-14 DIAGNOSIS — E118 Type 2 diabetes mellitus with unspecified complications: Secondary | ICD-10-CM

## 2018-06-14 MED ORDER — EZETIMIBE 10 MG PO TABS: 10.0000 mg | ORAL_TABLET | Freq: Every day | ORAL | 3 refills | Status: DC

## 2018-06-14 NOTE — Patient Instructions (Signed)
Medication Instructions:  START Zetia 10 mg daily  If you need a refill on your cardiac medications before your next appointment, please call your pharmacy.   Follow-Up: At [facility], you and your health needs are our priority.  As part of our continuing mission to provide you with exceptional heart care, we have created designated Provider Care Teams.  These Care Teams include your primary Cardiologist (physician) and Advanced Practice Providers (APPs -  Physician Assistants and Nurse Practitioners) who all work together to provide you with the care you need, when you need it. You will need a follow up appointment in 6 months.  Please call our office 2 months in advance to schedule this appointment.  You may see Nicki Guadalajarahomas Kelly, MD or one of the following Advanced Practice Providers on your designated Care Team: [place]ao Meng, [place]PA-C . Micah FlesherAngela Duke, PA-C  Any Other Special Instructions Will Be Listed Below (If Applicable). You have been referred to Dr. Caryl AdaMcQuaid-pulmologist

## 2018-06-14 NOTE — Progress Notes (Signed)
Patient ID: [name], male   DOB: 10-04-1950, 67 y.o.   MRN: [id number]      Primary M.D.: Dr. Alysia Penna  HPI: [name] is a 67 y.o. male presents to the office today for a 2 month  follow up cardiology evaluation.   [name] has CAD and in June 2002 underwent CABG surgery by Dr. Roxan Hockey with a sequential LIMA graft to the second diagonal and LAD, and sequential radial graft to the first and third diagonal branches. An echo Doppler study in 2010 showed normal systolic function with mild MR and trace TR.  A  nuclear perfusion study in March 2013 showed normal perfusion and function without scar or ischemia.  Additional problems include hypertension, type 2 diabetes mellitus, and hyperlipidemia. Remotely, he had worked as a Garment/textile technologist minor and in the past was some question of possible black lung. 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  In November 2015 he complained of a slight change in symptomatology with exertional shortness of breath and some arm weakness.  Prior to bypass surgery, he had never experienced chest pain but demonstrated exertional shortness of breath and arm discomfort.  He admitted to fatigue.  He denied any major episodes of chest pressure.  A 2-D echo Doppler study on 06/14/2014 showed an ejection fraction of 55-60% with mild focal basal hypertrophy of the septum; grade 1 diastolic dysfunction.  There was evidence for mild mitral regurgitation and moderate left atrial dilatation.  A nuclear perfusion study on December 11,2015 was interpreted as low risk.  He developed 1-2 mm of horizontal ST segment depression and had normal scintigraphic myocardial perfusion images.  Ejection fraction was 57%.  There was normal LV function without wall motion abnormality.  He has a history of hyperlipidemia and remotely self increased his simvastatin from 40 mg to 80 mg and developed occasional myalgias.   He has been on Bystolic 10 mg and lisinopril 30 mg for his hypertension.  He is on metformin 1000 mg twice a day as well as DiaBeta 5 mg for his type 2 diabetes mellitus.  When I last saw him in October 2018, he had stopped taking Livalo for approximately one month.  He had been busy doing home projects and denied any recurrent chest pain.  During his last evaluation.  I had a lengthy discussion with him and recommended another attempt at a trial of Livalo.  I also added supplemental coenzyme Q 10.  Over this time period, he was able to reinitiate 2 mg daily and has been able to tolerate this without myalgias.  Subsequent blood work did show improvement in his lipid status on 08/04/2017 with a total cholesterol 149, LDL 82.  Triglycerides 148, and HDL 37.  He has continued to be on Januvia and metformin for his diabetes mellitus.  Hypertension.  He has remained on amlodipine 5 mg, lisinopril 30 mg, and Bystolic 10 mg.   When I saw him in March 2019 he was tolerating Livalo and I recommended further titration to 4 mg daily and attempt to obtain an LDL less than 70.  Since I saw him, he has had issues with poorly controlled diabetes mellitus.  Apparently he was started on farziga but developed some dizziness secondary to this.  He is now on metformin, Januvia, and was started on Actos.  Earlier this year he had a fall off a ladder.   I last saw him in October  2019.  He ultimately will require rotator cuff surgery on his right shoulder.  His surgery was delayed due to initial glucose issues.  Since it is been over 17 years since his CABG revascularization, I recommended that he undergo a preoperative exercise Myoview study for preoperative assessment and clearance.  He underwent a nuclear perfusion study on May 05, 2018.  This was interpreted as low risk with normal perfusion and mildly reduced left ventricular global systolic function with an EF of 48%.  He developed horizontal ST segment depression of 1.5  mm during stress which returned to baseline at 5 to 9 minutes into the recovery period.  When compared to his prior nuclear perfusion study of 2015 this was not significantly changed and on that study he also had normal perfusion with positive ECG changes.  A subsequent echo Doppler study on May 30, 2018 showed an EF of 55 to 60%.  There was severe LVH.  There was grade 1 diastolic dysfunction with mild MR and mild LA dilation.  He denies any chest pain.  He has had periods of increased cough following prolonged coughs he may feel somewhat atypical chest pain discomfort.  He had previously worked in [employer] in [place] for approximately 13-1/2 years from 1974 until 1988 and has documented to have "black lungs."  He has  not had a recent evaluation for his lung disease.  He presents for evaluation.   Past Medical History:  Diagnosis Date  . CAD (coronary artery disease)    nuclear stress test-09/09/11 low risk scan EF61%, sees Dr. Claiborne Billings   . Chronic headache   . Chronic neck pain   . Diabetes mellitus   . Hyperlipidemia   . Hypertension 12/26/08   ECHO- EF>55%  . Insomnia     Past Surgical History:  Procedure Laterality Date  . APPENDECTOMY    . cervical spine injection    . COLONOSCOPY  04-02-06    per [facility], clear, repeat in 10 yrs  . CORONARY ARTERY BYPASS GRAFT  2002   4 vessels    Allergies  Allergen Reactions  . Codeine Anaphylaxis    Pt. verbalized that he has taken and can take Hydrocodone, Oxycodone w/o issue - codeine allergy only  . Crestor [Rosuvastatin]     Myalgia   . Isosorbide Nitrate Other (See Comments)    Headaches  . Lipitor [Atorvastatin]     myalgia  . Zolpidem Tartrate     REACTION: HA's  . Ciprofloxacin Hives and Rash    Current Outpatient Medications  Medication Sig Dispense Refill  . ACCU-CHEK AVIVA PLUS test strip TEST SUGAR ONCE A DAY AND DIAGNOSIS CODE IS E 11.9 100 each 1  . amLODipine (NORVASC) 5 MG tablet TAKE 1 TABLET BY MOUTH  EVERY DAY 90 tablet 0  . aspirin 81 MG tablet Take 81 mg by mouth daily.     . Calcium Carbonate-Vitamin D (CALCIUM 600+D PO) Take 1 tablet by mouth daily.    . clonazePAM (KLONOPIN) 0.5 MG tablet TAKE 1 TABLET (0.5 MG TOTAL) BY MOUTH 2 (TWO) TIMES DAILY AS NEEDED FOR ANXIETY. 60 tablet 5  . Coenzyme Q10 (COQ10) 100 MG CAPS Take 100 mg by mouth daily.     . fexofenadine (ALLEGRA) 180 MG tablet Take 180 mg by mouth daily as needed for allergies or rhinitis.    Marland Kitchen gabapentin (NEURONTIN) 300 MG capsule TAKE 3 CAPSULES BY MOUTH AT BEDTIME 270 capsule 0  . [START ON 07/10/2018] HYDROcodone-acetaminophen ([place])  10-325 MG tablet Take 1 tablet by mouth every 6 (six) hours as needed for moderate pain. 120 tablet 0  . JANUVIA 100 MG tablet TAKE 1 TABLET BY MOUTH EVERY DAY 90 tablet 3  . Lancets MISC Fast click lancet accu check may substitute for whever pt has 100 each 3  . lisinopril (PRINIVIL,ZESTRIL) 40 MG tablet Take 1 tablet (40 mg total) by mouth daily. 90 tablet 3  . meloxicam (MOBIC) 15 MG tablet TAKE 1 TABLET BY MOUTH EVERY DAY 90 tablet 3  . metFORMIN (GLUCOPHAGE) 1000 MG tablet TAKE 1 TABLET BY MOUTH TWICE A DAY 180 tablet 3  . nebivolol (BYSTOLIC) 10 MG tablet Take 1 tablet (10 mg total) by mouth daily. 30 tablet 11  . nitroGLYCERIN (NITROSTAT) 0.4 MG SL tablet Place 1 tablet (0.4 mg total) under the tongue every 5 (five) minutes as needed for chest pain. 25 tablet 6  . Omega-3 350 MG CPDR Take 350 mg by mouth daily.    . pioglitazone (ACTOS) 45 MG tablet Take 1 tablet (45 mg total) by mouth daily. 90 tablet 3  . Pitavastatin Calcium (LIVALO) 4 MG TABS Take 1 tablet (4 mg total) by mouth daily. 90 tablet 3  . sulfamethoxazole-trimethoprim (BACTRIM DS,SEPTRA DS) 800-160 MG tablet Take 1 tablet by mouth 2 (two) times daily as needed (bladder infections).    . ezetimibe (ZETIA) 10 MG tablet Take 1 tablet (10 mg total) by mouth daily. 90 tablet 3   No current facility-administered medications for  this visit.     Social History   Socioeconomic History  . Marital status: Married    Spouse name: Not on file  . Number of children: Not on file  . Years of education: Not on file  . Highest education level: Not on file  Occupational History  . Not on file  Social Needs  . Financial resource strain: Not on file  . Food insecurity:    Worry: Not on file    Inability: Not on file  . Transportation needs:    Medical: Not on file    Non-medical: Not on file  Tobacco Use  . Smoking status: Former Smoker    Last attempt to quit: 11/26/1970    Years since quitting: 47.5  . Smokeless tobacco: Never Used  Substance and Sexual Activity  . Alcohol use: No    Alcohol/week: 0.0 standard drinks  . Drug use: No  . Sexual activity: Not on file  Lifestyle  . Physical activity:    Days per week: Not on file    Minutes per session: Not on file  . Stress: Not on file  Relationships  . Social connections:    Talks on phone: Not on file    Gets together: Not on file    Attends religious service: Not on file    Active member of club or organization: Not on file    Attends meetings of clubs or organizations: Not on file    Relationship status: Not on file  . Intimate partner violence:    Fear of current or ex partner: Not on file    Emotionally abused: Not on file    Physically abused: Not on file    Forced sexual activity: Not on file  Other Topics Concern  . Not on file  Social History Narrative  . Not on file    Family History  Problem Relation Age of Onset  . Liver cancer Father   . Cancer Brother   .  Cancer Brother   . Cancer Brother   . Coronary artery disease Unknown   . Hypertension Unknown   . Heart disease Mother   . Colon cancer Neg Hx   . Stomach cancer Neg Hx   . Esophageal cancer Neg Hx    Socially he is married. He has 3 children 8 grandchildren and 4 great-grandchildren. There is no tobacco or alcohol use. He is now retired.   ROS General: Negative; No  fevers, chills, or night sweats;  HEENT: Decrease in hearing ,No changes in vision  sinus congestion, difficulty swallowing Pulmonary: History of "black lung" Cardiovascular: See history of present illness GI: Negative; No nausea, vomiting, diarrhea, or abdominal pain GU: Negative; No dysuria, hematuria, or difficulty voiding Musculoskeletal: Negative; no myalgias, joint pain, or weakness Hematologic/Oncology: Negative; no easy bruising, bleeding Endocrine: Positive for diabetes mellitus Neuro: Positive for paresthesias in his legs. Skin: Negative; No rashes or skin lesions Psychiatric: Negative; No behavioral problems, depression Sleep: Positive for restless legs; No snoring, daytime sleepiness, hypersomnolence, bruxism, hypnogognic hallucinations, no cataplexy Other comprehensive 14 point system review is negative.   PE BP 120/68   Pulse 61   Ht 5' 9.5" (1.765 m)   Wt 214 lb 3.2 oz (97.2 kg)   BMI 31.18 kg/m   Repeat blood pressure by me was 128/70  Wt Readings from Last 3 Encounters:  06/14/18 214 lb 3.2 oz (97.2 kg)  05/30/18 213 lb 6.4 oz (96.8 kg)  05/19/18 214 lb (97.1 kg)   General: Alert, oriented, no distress.  Skin: normal turgor, no rashes, warm and dry HEENT: Normocephalic, atraumatic. Pupils equal round and reactive to light; sclera anicteric; extraocular muscles intact;  Nose without nasal septal hypertrophy Mouth/Parynx benign; Mallinpatti scale 3 Neck: No JVD, no carotid bruits; normal carotid upstroke Lungs:  no wheezing or rales Chest wall: without tenderness to palpitation Heart: PMI not displaced, RRR, s1 s2 normal, 1/6 systolic murmur, no diastolic murmur, no rubs, gallops, thrills, or heaves Abdomen: soft, nontender; no hepatosplenomehaly, BS+; abdominal aorta nontender and not dilated by palpation. Back: no CVA tenderness Pulses 2+ Musculoskeletal: full range of motion, normal strength, no joint deformities Extremities: no clubbing cyanosis or  edema, Homan's sign negative  Neurologic: grossly nonfocal; Cranial nerves grossly wnl Psychologic: Normal mood and affect   ECG (independently read by me): Normal sinus rhythm at 61 bpm.  Early transition.  No ectopy.  No ST segment changes.  October 2019 ECG (independently read by me): Sinus bradycardia 53 bpm.  Early transition.  No ectopy.  March 2019 ECG (independently read by me): Sinus bradycardia 59 bpm.  Early transition.  Normal intervals.  October 2018 ECG (independently read by me): Normal sinus rhythm at 60 bpm.  Nonspecific T wave abnormality.  Normal intervals.  No ectopy.  May 2018 ECG (independently read by me): Sinus bradycardia 59 bpm.  No significant ST-T changes.  Early transition.  April 2017 ECG (independently read by me): Sinus bradycardia at 53 bpm.  Q waves in III and F.  March 2016 ECG (independently read by me): Normal sinus rhythm at 67.  No ectopy.  November 2015 ECG (independently read by me): Normal sinus rhythm at 65 bpm.  Early transition.  No significant ST segment changes.  Normal intervals.  Prior ECG: Normal sinus rhythm at 58 beats per minute. QTc interval 428 ms. PR interval 170 ms. Nonspecific ST changes   LABS:  BMP Latest Ref Rng & Units 04/05/2018 02/18/2018 11/22/2017  Glucose 65 - 99  mg/dL 130(H) 267(H) 180(H)  BUN 8 - 27 mg/dL _0 Creatinine 0.76 - 1.27 mg/dL 0.93 0.88 0.84  BUN/Creat Ratio 10 - 24 12 - NOT APPLICABLE  Sodium 638 - 144 mmol/L 143 137 141  Potassium 3.5 - 5.2 mmol/L 3.8 4.0 4.4  Chloride 96 - 106 mmol/L 100 102 105  CO2 20 - 29 mmol/L _1 Calcium 8.6 - 10.2 mg/dL 9.3 9.2 9.3    Hepatic Function Latest Ref Rng & Units 04/05/2018 11/22/2017 08/04/2017  Total Protein 6.0 - 8.5 g/dL 6.9 7.0 7.2  Albumin 3.6 - 4.8 g/dL 4.5 - 4.3  AST 0 - 40 IU/L _2 ALT 0 - 44 IU/L _3 Alk Phosphatase 39 - 117 IU/L 73 - 78  Total Bilirubin 0.0 - 1.2 mg/dL 0.3 0.4 0.4  Bilirubin, Direct 0.0 - 0.3 mg/dL - - 0.1     CBC Latest Ref Rng & Units 02/18/2018 08/04/2017 11/05/2016  WBC 4.0 - 10.5 K/uL 6.8 7.4 7.9  Hemoglobin 13.0 - 17.0 g/dL 13.9 14.5 14.5  Hematocrit 39.0 - 52.0 % 41.5 42.9 43.1  Platelets 150 - 400 K/uL 166 191.0 202   Lab Results  Component Value Date   MCV 89.1 02/18/2018   MCV 88.6 08/04/2017   MCV 90.9 11/05/2016    Lab Results  Component Value Date   TSH 2.78 08/04/2017   Lab Results  Component Value Date   HGBA1C 8.4 (H) 05/10/2018    BNP No results found for: PROBNP  Lipid Panel     Component Value Date/Time   CHOL 153 04/05/2018 0832   CHOL 173 05/24/2013 1049   TRIG 81 04/05/2018 0832   TRIG 185 (H) 05/24/2013 1049   HDL 49 04/05/2018 0832   HDL 39 (L) 05/24/2013 1049   CHOLHDL 3.1 04/05/2018 0832   CHOLHDL 3.4 11/22/2017 0846   VLDL 29.6 08/04/2017 1101   LDLCALC 88 04/05/2018 0832   LDLCALC 84 11/22/2017 0846   LDLCALC 97 05/24/2013 1049     RADIOLOGY: No results found.  IMPRESSION:  1. Coronary artery disease involving coronary bypass graft of native heart without angina pectoris   2. Hx of CABG   3. Essential hypertension   4. Black lung ([place])   5. Hyperlipidemia LDL goal <70   6. Type 2 diabetes mellitus with complication, without long-term current use of insulin [facility])     ASSESSMENT AND PLAN: [name] is a 67 year old Caucasian male who  underwent CABG revascularization surgery by Dr. Roxan Hockey in November 2002.  Remotely he had noticed mild shortness of breath and fatigue and never experienced classic substernal chest pressure.  A nuclear perfusion study in 2015  continued to show normal perfusion but he did develop ST segment changes of 1-2 mm.  Amlodipine was added to his medical regimen and since institution he has felt improved.  He underwent a 4-year follow-up nuclear stress test in anticipation of potential future shoulder surgery.  This essentially was unchanged from previously and continued to show normal perfusion but  again he developed asymptomatic ST depression which lasted 5 to 9 minutes into the recovery period for resolution.  He has been on a multidrug regimen for hypertension and blood pressure today is controlled at 128/70 on his current regimen of amlodipine 5 mg, lisinopril 40 mg, nebivolol 10 mg daily.  On his recent echo Doppler study he had normal systolic function but severe LVH undoubtedly contributed by his hypertension.  He has had issues with statin intolerance but has been able to tolerate Livalo and was titrated up to 4 mg daily.  LDL cholesterol on April 05, 2018 was 88.  I have recommended the addition of Zetia 10 mg with aim for target LDL less than 70.  If this cannot be achieved, he would be a good candidate  for PCSK9 inhibition for [place].  With his then [place] history with shortness of breath and reported previous diagnosis of black lung I have recommended comprehensive pulmonary evaluation and will schedule him an appointment to see [name].  He is diabetic on metformin in addition to Actos and Januvia.  He has neuropathy on gabapentin.     Thomas Sine, MD, [facility]  06/15/2018 9:50 PM

## 2018-06-15 ENCOUNTER — Encounter: Payer: Self-pay | Admitting: Cardiovascular Disease

## 2018-06-27 ENCOUNTER — Other Ambulatory Visit: Payer: Self-pay | Admitting: Family Medicine

## 2018-07-06 ENCOUNTER — Other Ambulatory Visit: Payer: Self-pay | Admitting: Cardiovascular Disease

## 2018-07-11 ENCOUNTER — Telehealth: Payer: Self-pay | Admitting: Cardiovascular Disease

## 2018-07-11 NOTE — Telephone Encounter (Signed)
New message:   Patient wife calling concerning some medication called livalo 4 mg patient they went to get the medication and it was 600 dollars. Patient states that he something else this medication is to much. Please call patient back.

## 2018-07-11 NOTE — Telephone Encounter (Signed)
PA submitted VIA covermymeds  Awaiting response.

## 2018-07-11 NOTE — Telephone Encounter (Signed)
Returned call to patient's wife. She reports she went to pick up her and her  husband's Livalo and it was $600 for 90 day supply and $200 for 30 day supply. She takes this as well. Livalo is not on their covered on their drug plan with [employer]. She reports she was told that they would not pay until they met their $1500 deductible. She reports they have both tried different statins and this works best for both of them. She was inquiring about prescriptions from [place] as well  Advised would route to MD/CV[facility] for assistance

## 2018-07-11 NOTE — Telephone Encounter (Signed)
Patient's wife notified of plan/recommendations per [facility]. She voiced understanding. She will pick up samples for her and her husband. She states MD spoke with patient about PCSK9i. She states they would qualify for patient assistance if medical necessity letter and/or PA does not pan out and patient needs to be changed.  livalo 4mg  samples x2 boxes Lot: [id number] Exp: 01/2020

## 2018-07-11 NOTE — Telephone Encounter (Signed)
Should try letter of medical necessity or PA before considering other alternatives, due to history of statin intolerance.    Another option is PCSK9i if patient willing to try injectable medication.  Please provide 1-2 weeks of samples , if available, while paperwork completed. [facility] nurse should be able to complete letter of medial necessity and/or PA process.

## 2018-07-18 ENCOUNTER — Telehealth: Payer: Self-pay | Admitting: Family Medicine

## 2018-07-18 ENCOUNTER — Other Ambulatory Visit: Payer: Self-pay | Admitting: Cardiovascular Disease

## 2018-07-18 NOTE — Telephone Encounter (Signed)
Copied from CRM [phone]. Topic: Quick Communication - Rx Refill/Question >> Jul 18, 2018  3:33 PM Jilda Rocheemaray, Melissa wrote: Medication: HYDROcodone-acetaminophen (NORCO) 10-325 MG tablet     Has the patient contacted their pharmacy? Yes.   (Agent: If no, request that the patient contact the pharmacy for the refill.) (Agent: If yes, when and what did the pharmacy advise?) [facility] [address] [facility] and won't get any in until February 10th. [facility] [address]  Preferred Pharmacy (with phone number or street name): [id number] [place] - [address] [phone] (Phone) [phone] (Fax)    Agent: Please be advised that RX refills may take up to 3 business days. We ask that you follow-up with your pharmacy.

## 2018-07-18 NOTE — Telephone Encounter (Signed)
He has refills to last until 2-10-05-18. He will need a PMV in the next week or two

## 2018-07-18 NOTE — Telephone Encounter (Signed)
Dr. Fry please advise on refill. Thanks  

## 2018-07-18 NOTE — Telephone Encounter (Signed)
Called the pt and he stated that the pharmacy that he normally uses is out of the medication and will not get any in the pharmacy until Feb 12.  They need this rx [facility] [address].

## 2018-07-19 MED ORDER — HYDROCODONE-ACETAMINOPHEN 10-325 MG PO TABS: 1.0000 | ORAL_TABLET | Freq: Four times a day (QID) | ORAL | 0 refills | Status: AC | PRN

## 2018-07-19 NOTE — Telephone Encounter (Signed)
Called and spoke with pts wife and she is aware of rx that has been sent to the requested pharmacy.

## 2018-07-19 NOTE — Telephone Encounter (Signed)
Done

## 2018-07-22 NOTE — Telephone Encounter (Signed)
Appeal letter submitted for patient after receiving denial

## 2018-07-26 ENCOUNTER — Other Ambulatory Visit: Payer: Self-pay | Admitting: Family Medicine

## 2018-07-29 ENCOUNTER — Ambulatory Visit (INDEPENDENT_AMBULATORY_CARE_PROVIDER_SITE_OTHER)
Admission: RE | Admit: 2018-07-29 | Discharge: 2018-07-29 | Disposition: A | Discharge status: Home / Self Care | Payer: Medicare PPO | Source: Ambulatory Visit | Attending: Pulmonary Disease | Admitting: Pulmonary Disease

## 2018-07-29 ENCOUNTER — Telehealth: Payer: Self-pay | Admitting: Cardiovascular Disease

## 2018-07-29 ENCOUNTER — Encounter: Payer: Self-pay | Admitting: Pulmonary Disease

## 2018-07-29 ENCOUNTER — Ambulatory Visit (INDEPENDENT_AMBULATORY_CARE_PROVIDER_SITE_OTHER): Payer: Medicare PPO | Admitting: Pulmonary Disease

## 2018-07-29 VITALS — BP 132/68 | HR 65 | Ht 69.5 in | Wt 222.6 lb

## 2018-07-29 DIAGNOSIS — R0600 Dyspnea, unspecified: Secondary | ICD-10-CM

## 2018-07-29 DIAGNOSIS — R0602 Shortness of breath: Secondary | ICD-10-CM | POA: Diagnosis not present

## 2018-07-29 DIAGNOSIS — J60 Coalworker's pneumoconiosis: Secondary | ICD-10-CM

## 2018-07-29 MED ORDER — AZELASTINE HCL 0.1 % NA SOLN: 2.0000 | Freq: Two times a day (BID) | NASAL | 11 refills | Status: DC

## 2018-07-29 NOTE — Progress Notes (Signed)
Synopsis: Referred in January 2020 for shortness of breath.  Has a history of CAD s/p CABG 2002.    Subjective:   PATIENT ID: [name] GENDER: male DOB: [birth year], MRN: [id number]   HPI  Chief Complaint  Patient presents with  . Consult    referred by Dr. Tresa Endo for worsening SOB Xfew months.      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  Smoked 1/3 ppd.   Cough: > has really bad allergies and sinus drainage that affects him all the time > if he remembers to take his allergy medicine then he doesn't cough much > he does well with benadryl and claritin > Flonase made his nose bleed  History of CAD: > had a CABG in 2002 > apparently didn't have any preceding symptoms or signs  Past Medical History:  Diagnosis Date  . CAD (coronary artery disease)    nuclear stress test-09/09/11 low risk scan EF61%, sees Dr. Tresa Endo   . Chronic headache   . Chronic neck pain   . Diabetes mellitus   . Hyperlipidemia   . Hypertension 12/26/08   ECHO- EF>55%  . Insomnia      Family History  Problem Relation Age of Onset  . Liver cancer Father   . Cancer Brother   . Cancer  Brother   . Cancer Brother   . Coronary artery disease Other   . Hypertension Other   . Heart disease Mother   . Colon cancer Neg Hx   . Stomach cancer Neg Hx   . Esophageal cancer Neg Hx      Social History   Socioeconomic History  . Marital status: Married    Spouse name: Not on file  . Number of children: Not on file  . Years of education: Not on file  . Highest education level: Not on file  Occupational History  . Not on file  Social Needs  . Financial resource strain: Not on file  . Food insecurity:    Worry: Not on file    Inability: Not on file  . Transportation needs:    Medical: Not on file    Non-medical: Not on file  Tobacco Use  . Smoking status: Former Smoker    Packs/day: 0.25    Years: 10.00    Pack years: 2.50    Types: Cigarettes    Last attempt to quit: 11/26/1970    Years since quitting: 47.7  . Smokeless tobacco: Never Used  Substance and Sexual Activity  . Alcohol use: No    Alcohol/week: 0.0  standard drinks  . Drug use: No  . Sexual activity: Not on file  Lifestyle  . Physical activity:    Days per week: Not on file    Minutes per session: Not on file  . Stress: Not on file  Relationships  . Social connections:    Talks on phone: Not on file    Gets together: Not on file    Attends religious service: Not on file    Active member of club or organization: Not on file    Attends meetings of clubs or organizations: Not on file    Relationship status: Not on file  . Intimate partner violence:    Fear of current or ex partner: Not on file    Emotionally abused: Not on file    Physically abused: Not on file    Forced sexual activity: Not on file  Other Topics Concern  . Not on file  Social History Narrative  . Not on file     Allergies  Allergen Reactions  . Codeine Anaphylaxis    Pt. verbalized that he has taken and can take Hydrocodone, Oxycodone w/o issue - codeine allergy only  . Crestor [Rosuvastatin]     Myalgia   .  Isosorbide Nitrate Other (See Comments)    Headaches  . Lipitor [Atorvastatin]     myalgia  . Zolpidem Tartrate     REACTION: HA's  . Ciprofloxacin Hives and Rash     Outpatient Medications Prior to Visit  Medication Sig Dispense Refill  . ACCU-CHEK AVIVA PLUS test strip TEST SUGAR ONCE A DAY AND DIAGNOSIS CODE IS E 11.9 100 each 1  . amLODipine (NORVASC) 5 MG tablet TAKE 1 TABLET BY MOUTH EVERY DAY 90 tablet 0  . aspirin 81 MG tablet Take 81 mg by mouth daily.     Marland Kitchen. BYSTOLIC 10 MG tablet TAKE 1 TABLET BY MOUTH EVERY DAY 90 tablet 3  . Calcium Carbonate-Vitamin D (CALCIUM 600+D PO) Take 1 tablet by mouth daily.    . clonazePAM (KLONOPIN) 0.5 MG tablet TAKE 1 TABLET (0.5 MG TOTAL) BY MOUTH 2 (TWO) TIMES DAILY AS NEEDED FOR ANXIETY. 60 tablet 5  . Coenzyme Q10 (COQ10) 100 MG CAPS Take 100 mg by mouth daily.     Marland Kitchen. ezetimibe (ZETIA) 10 MG tablet Take 1 tablet (10 mg total) by mouth daily. 90 tablet 3  . fexofenadine (ALLEGRA) 180 MG tablet Take 180 mg by mouth daily as needed for allergies or rhinitis.    Marland Kitchen. gabapentin (NEURONTIN) 300 MG capsule TAKE 3 CAPSULES BY MOUTH AT BEDTIME 270 capsule 0  . HYDROcodone-acetaminophen (NORCO) 10-325 MG tablet Take 1 tablet by mouth every 6 (six) hours as needed for up to 30 days for moderate pain. 120 tablet 0  . JANUVIA 100 MG tablet TAKE 1 TABLET BY MOUTH EVERY DAY 30 tablet 11  . Lancets MISC Fast click lancet accu check may substitute for whever pt has 100 each 3  . lisinopril (PRINIVIL,ZESTRIL) 40 MG tablet Take 1 tablet (40 mg total) by mouth daily. 90 tablet 3  . LIVALO 4 MG TABS TAKE 1 TABLET BY MOUTH EVERY DAY 90 tablet 3  . meloxicam (MOBIC) 15 MG tablet TAKE 1 TABLET BY MOUTH EVERY DAY 90 tablet 3  . metFORMIN (GLUCOPHAGE) 1000 MG tablet TAKE 1 TABLET BY MOUTH TWICE A DAY 180 tablet 3  . nitroGLYCERIN (NITROSTAT) 0.4 MG SL tablet Place 1 tablet (0.4 mg total) under the tongue every 5 (five) minutes  as needed for chest pain. 25 tablet 6  .  Omega-3 350 MG CPDR Take 350 mg by mouth daily.    . pioglitazone (ACTOS) 45 MG tablet Take 1 tablet (45 mg total) by mouth daily. 90 tablet 3  . sulfamethoxazole-trimethoprim (BACTRIM DS,SEPTRA DS) 800-160 MG tablet Take 1 tablet by mouth 2 (two) times daily as needed (bladder infections).     No facility-administered medications prior to visit.     Review of Systems  Constitutional: Negative for fever and weight loss.  HENT: Positive for congestion. Negative for ear pain, nosebleeds and sore throat.   Eyes: Negative for redness.  Respiratory: Positive for cough and shortness of breath. Negative for wheezing.   Cardiovascular: Negative for palpitations, leg swelling and PND.  Gastrointestinal: Negative for nausea and vomiting.  Genitourinary: Negative for dysuria.  Skin: Negative for rash.  Neurological: Negative for headaches.  Endo/Heme/Allergies: Does not bruise/bleed easily.  Psychiatric/Behavioral: Negative for depression. The patient is not nervous/anxious.       Objective:  Physical Exam   Vitals:   07/29/18 1534  BP: 132/68  Pulse: 65  SpO2: 98%  Weight: 222 lb 9.6 oz (101 kg)  Height: 5' 9.5" (1.765 m)   Ambulated 500 feet on room air and his O2 saturation remained from 96 to 98%  Gen: well appearing, no acute distress HENT: NCAT, OP clear, neck supple without masses Eyes: PERRL, EOMi Lymph: no cervical lymphadenopathy PULM: CTA B CV: RRR, no mgr, no JVD GI: BS+, soft, nontender, no hsm Derm: no rash or skin breakdown MSK: normal bulk and tone Neuro: A&Ox4, CN II-XII intact, strength 5/5 in all 4 extremities Psyche: normal mood and affect   CBC    Component Value Date/Time   WBC 6.8 02/18/2018 1016   RBC 4.66 02/18/2018 1016   HGB 13.9 02/18/2018 1016   HCT 41.5 02/18/2018 1016   PLT 166 02/18/2018 1016   MCV 89.1 02/18/2018 1016   MCH 29.8 02/18/2018 1016   MCHC 33.5 02/18/2018 1016   RDW 12.7 02/18/2018 1016   LYMPHSABS 1.9 08/04/2017 1101    MONOABS 0.7 08/04/2017 1101   EOSABS 0.1 08/04/2017 1101   BASOSABS 0.1 08/04/2017 1101     Chest imaging: January 2020 chest x-ray images independently reviewed showing normal pulmonary parenchyma.  PFT:  Labs:  Path:  Echo: November 2019 transthoracic echocardiogram showed severe left ventricular hypertrophy, LVEF 55 to 60%, grade 1 diastolic dysfunction, mild mitral regurgitation, left atrium mildly dilated.  Heart Catheterization:  Records from his visit with Dr. Tresa Endo reviewed where he was seen for intermittent shortness of breath, deep breaths.  He was referred to [place] for evaluation of possible lung disease but there was some consideration to a left heart catheterization for his known coronary artery disease.     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  Specifically he tells me that he has no shortness of breath when he goes to the grocery store, carries in groceries, works in the yard, or walks a mile every day with his dog.  Objectively, his lungs are clear, his chest x-ray is normal, he has no airflow obstruction, and his oxygenation is normal.  So at  this time is difficult to say whether or not there is an underlying lung disease.  I suppose that there could be some fine amounts of silicosis which is not detectable on his chest x-ray, though after having a median sternotomy it is difficult for me to interpret the mediastinal portion of his chest x-ray as to whether or not there could be some calcified lymph nodes there.  He may benefit from a high-resolution CT scan of the chest to definitively say whether or not there is underlying lung disease.  I offered this today but he was not interested  because he does not want to have to pay the bill for it.  So at this time we can plan on getting another high-resolution CT scan of the chest but all other testing is within normal limits so I have no evidence of an underlying lung disease.  Plan: Shortness of breath in the last year: Today's chest x-ray, spirometry testing, and oxygen tests were all within normal limits. 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  You can call us and let us know if you decide that she would like to have this test done and I am happy to order it for you.  History of coronary artery disease: Follow-up with Dr. Tresa Endo  Allergic rhinitis: Continue taking Claritin as you are doing Take Astelin 2 sprays each nostril twice a day Try using NeilMed saline rinses, you can buy these over-the-counter  Follow-up if symptoms recur  > 50% of this 65 min visit spent face to face   Current Outpatient Medications:  .  ACCU-CHEK AVIVA PLUS test strip, TEST SUGAR ONCE A DAY AND DIAGNOSIS CODE IS E 11.9, Disp: 100 each, Rfl: 1 .  amLODipine (NORVASC) 5 MG tablet, TAKE 1 TABLET BY MOUTH EVERY DAY, Disp: 90 tablet, Rfl: 0 .  aspirin 81 MG tablet, Take 81 mg by mouth daily. , Disp: , Rfl:  .  BYSTOLIC 10 MG tablet, TAKE 1 TABLET BY MOUTH EVERY DAY, Disp: 90 tablet, Rfl: 3 .  Calcium Carbonate-Vitamin D (CALCIUM 600+D PO), Take 1 tablet by mouth daily., Disp: , Rfl:  .  clonazePAM (KLONOPIN) 0.5 MG tablet, TAKE 1 TABLET (0.5 MG TOTAL) BY MOUTH 2 (TWO) TIMES DAILY AS NEEDED FOR ANXIETY., Disp: 60 tablet, Rfl: 5 .  Coenzyme Q10 (COQ10) 100 MG CAPS, Take 100 mg by mouth daily. , Disp: , Rfl:  .  ezetimibe (ZETIA) 10 MG tablet, Take 1 tablet (10 mg total) by mouth daily., Disp: 90 tablet, Rfl: 3 .  fexofenadine (ALLEGRA) 180 MG tablet, Take 180 mg by mouth daily as  needed for allergies or rhinitis., Disp: , Rfl:  .  gabapentin (NEURONTIN) 300 MG capsule, TAKE 3 CAPSULES BY MOUTH AT BEDTIME, Disp: 270 capsule, Rfl: 0 .  HYDROcodone-acetaminophen (NORCO) 10-325 MG tablet, Take 1 tablet by mouth every 6 (six) hours as needed for up to 30 days for moderate pain., Disp: 120 tablet, Rfl: 0 .  JANUVIA 100 MG tablet, TAKE 1 TABLET BY MOUTH EVERY DAY, Disp: 30 tablet, Rfl: 11 .  Lancets MISC, Fast click lancet accu check may substitute for whever pt has, Disp: 100 each, Rfl: 3 .  lisinopril (PRINIVIL,ZESTRIL) 40 MG tablet, Take 1 tablet (40 mg total) by mouth daily., Disp: 90 tablet, Rfl: 3 .  LIVALO  4 MG TABS, TAKE 1 TABLET BY MOUTH EVERY DAY, Disp: 90 tablet, Rfl: 3 .  meloxicam (MOBIC) 15 MG tablet, TAKE 1 TABLET BY MOUTH EVERY DAY, Disp: 90 tablet, Rfl: 3 .  metFORMIN (GLUCOPHAGE) 1000 MG tablet, TAKE 1 TABLET BY MOUTH TWICE A DAY, Disp: 180 tablet, Rfl: 3 .  nitroGLYCERIN (NITROSTAT) 0.4 MG SL tablet, Place 1 tablet (0.4 mg total) under the tongue every 5 (five) minutes as needed for chest pain., Disp: 25 tablet, Rfl: 6 .  Omega-3 350 MG CPDR, Take 350 mg by mouth daily., Disp: , Rfl:  .  pioglitazone (ACTOS) 45 MG tablet, Take 1 tablet (45 mg total) by mouth daily., Disp: 90 tablet, Rfl: 3 .  sulfamethoxazole-trimethoprim (BACTRIM DS,SEPTRA DS) 800-160 MG tablet, Take 1 tablet by mouth 2 (two) times daily as needed (bladder infections)., Disp: , Rfl:

## 2018-07-29 NOTE — Telephone Encounter (Signed)
Called patient wife and advised that medications were available for pick up.

## 2018-07-29 NOTE — Telephone Encounter (Signed)
New Message   Patient calling the office for samples of medication:   1.  What medication and dosage are you requesting samples for? Livalo 10mg    2.  Are you currently out of this medication? yes

## 2018-07-29 NOTE — Patient Instructions (Addendum)
Shortness of breath in the last year: Today's chest x-ray, spirometry testing, and oxygen tests were all within normal limits. 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  You can call [name] and let [name] know if you decide that she would like to have this test done and I am happy to order it for you.  History of coronary artery disease: Follow-up with Dr. Tresa Endo  Allergic rhinitis: Continue taking Claritin as you are doing Take Astelin 2 sprays each nostril twice a day Try using NeilMed saline rinses, you can buy these over-the-counter  Follow-up if symptoms recur

## 2018-08-03 NOTE — Telephone Encounter (Signed)
Appeal was denied, will speak with Dr.Kelly on how to proceed.

## 2018-08-05 NOTE — Telephone Encounter (Signed)
Will speak with PharmD on how to proceed. Sending a message to her.

## 2018-08-09 ENCOUNTER — Encounter: Payer: Self-pay | Admitting: Family Medicine

## 2018-08-09 ENCOUNTER — Ambulatory Visit (INDEPENDENT_AMBULATORY_CARE_PROVIDER_SITE_OTHER): Payer: Medicare PPO | Admitting: Family Medicine

## 2018-08-09 VITALS — BP 122/70 | HR 61 | Temp 97.8°F | Wt 220.4 lb

## 2018-08-09 DIAGNOSIS — E119 Type 2 diabetes mellitus without complications: Secondary | ICD-10-CM

## 2018-08-09 LAB — POCT GLYCOSYLATED HEMOGLOBIN (HGB A1C): Hemoglobin A1C: 8.5 % — AB (ref 4.0–5.6)

## 2018-08-09 MED ORDER — LIRAGLUTIDE 18 MG/3ML ~~LOC~~ SOPN: 0.6000 mg | PEN_INJECTOR | Freq: Every day | SUBCUTANEOUS | 5 refills | Status: DC

## 2018-08-09 NOTE — Progress Notes (Signed)
   Subjective:    Patient ID: [name] H Pulver, male    DOB: 1951/01/03, 68 y.o.   MRN: [id number]  HPI Here to follow up on diabetes. At our last visit we increased Actos to 45 mg daily, but his A1c today is still at 8.5. He feels well.    Review of Systems  Constitutional: Negative.   Respiratory: Negative.   Cardiovascular: Negative.        Objective:   Physical Exam Constitutional:      Appearance: Normal appearance.  Cardiovascular:     Rate and Rhythm: Normal rate and regular rhythm.     Pulses: Normal pulses.     Heart sounds: Normal heart sounds.  Pulmonary:     Effort: Pulmonary effort is normal.     Breath sounds: Normal breath sounds.  Neurological:     General: No focal deficit present.     Mental Status: He is alert and oriented to person, place, and time.           Assessment & Plan:  Type diabetes, poorly controlled. We will stop the Januvia and start on Victoza 0.6 mg daily. He will return on one week for dosage adjustment. He will check his glucose twice daily and report back to us. Gershon CraneStephen Fry, MD

## 2018-08-11 NOTE — Telephone Encounter (Signed)
I received notification via mail that an appeal by the member and right department was received on January 28th 2020, and it could take up to 30 days for a response by the appeals department. I contacted Elissa Burgess at [phone] and left a message to make sure they did not need anything else from us on our end regarding the appeal letter.   

## 2018-08-15 ENCOUNTER — Telehealth: Payer: Self-pay | Admitting: Family Medicine

## 2018-08-15 MED ORDER — PEN NEEDLES 31G X 8 MM MISC: 6 refills | Status: DC

## 2018-08-15 NOTE — Telephone Encounter (Signed)
Copied from CRM [phone]. Topic: Quick Communication - Rx Refill/Question >> Aug 15, 2018 11:01 AM Lynne LoganHudson, Caryn D wrote: Medication: Pt's wife called and stated Dr. Clent RidgesFry refilled pt's Victoza but did not send in any insulin pens. Requesting rx for pens. Pharmacy supplied enough to last pt to today. Please advise.  Has the patient contacted their pharmacy? Yes.   (Agent: If no, request that the patient contact the pharmacy for the refill.) (Agent: If yes, when and what did the pharmacy advise?)  Preferred Pharmacy (with phone number or street name): [id number] [facility] [address] [phone] (Phone) [phone])  Agent: Please be advised that RX refills may take up to 3 business days. We ask that you follow-up with your pharmacy.

## 2018-08-15 NOTE — Telephone Encounter (Signed)
Refill of the pen needles has been sent to the pharmacy.

## 2018-08-16 ENCOUNTER — Ambulatory Visit (INDEPENDENT_AMBULATORY_CARE_PROVIDER_SITE_OTHER): Payer: Medicare PPO | Admitting: Family Medicine

## 2018-08-16 ENCOUNTER — Encounter: Payer: Self-pay | Admitting: Family Medicine

## 2018-08-16 VITALS — BP 130/60 | HR 72 | Temp 97.7°F | Wt 217.8 lb

## 2018-08-16 DIAGNOSIS — E119 Type 2 diabetes mellitus without complications: Secondary | ICD-10-CM

## 2018-08-16 MED ORDER — LIRAGLUTIDE 18 MG/3ML ~~LOC~~ SOPN: 1.2000 mg | PEN_INJECTOR | Freq: Every day | SUBCUTANEOUS | 5 refills | Status: DC

## 2018-08-16 NOTE — Progress Notes (Signed)
   Subjective:    Patient ID: [name], male    DOB: 11/23/1950, 68 y.o.   MRN: [id number]  HPI Here to follow up on diabetes. One week ago he stopped Januvia and started on Victoza 0.6 mg daily. He had one day of some nausea but this went away. He now feels fine. His glucoses have been coming down at home, and he has ranged from 120 to 180 this past week. His fasting glucose this morning was 118.    Review of Systems  Constitutional: Negative.   Respiratory: Negative.   Cardiovascular: Negative.   Gastrointestinal: Negative.        Objective:   Physical Exam Constitutional:      Appearance: Normal appearance.  Cardiovascular:     Rate and Rhythm: Normal rate and regular rhythm.     Pulses: Normal pulses.     Heart sounds: Normal heart sounds.  Pulmonary:     Effort: Pulmonary effort is normal.     Breath sounds: Normal breath sounds.  Neurological:     Mental Status: He is alert.           Assessment & Plan:  He is improving, so now we will increase the Victoza to 1.2 mg daily. Stop the Actos. Recheck in one week.  Gershon Crane, MD

## 2018-08-17 ENCOUNTER — Other Ambulatory Visit: Payer: Self-pay | Admitting: Cardiovascular Disease

## 2018-08-19 ENCOUNTER — Telehealth: Payer: Self-pay | Admitting: Family Medicine

## 2018-08-19 DIAGNOSIS — E119 Type 2 diabetes mellitus without complications: Secondary | ICD-10-CM

## 2018-08-19 NOTE — Telephone Encounter (Signed)
Dr. Fry please advise 

## 2018-08-19 NOTE — Telephone Encounter (Signed)
Copied from CRM [id number]. Topic: Quick Communication - Rx Refill/Question >> Aug 19, 2018  9:04 AM Jens Som A wrote: Medication: HYDROcodone-acetaminophen (NORCO) 10-325 MG tablet [[id number]]  ENDED   Has the patient contacted their pharmacy? Yes  (Agent: If no, request that the patient contact the pharmacy for the refill.) (Agent: If yes, when and what did the pharmacy advise?)  Preferred Pharmacy (with phone number or street name): [id number] - [place], [place] - [address] AT [facility] [address] [facility] [address] [phone] (Phone) [phone] (Fax)    Agent: Please be advised that RX refills may take up to 3 business days. We ask that you follow-up with your pharmacy.

## 2018-08-22 ENCOUNTER — Telehealth: Payer: Self-pay

## 2018-08-22 MED ORDER — HYDROCODONE-ACETAMINOPHEN 10-325 MG PO TABS: 1.0000 | ORAL_TABLET | Freq: Four times a day (QID) | ORAL | 0 refills | Status: AC | PRN

## 2018-08-22 NOTE — Telephone Encounter (Signed)
Dr. Fry please advise. Thanks  

## 2018-08-22 NOTE — Telephone Encounter (Signed)
Since he is having an allergic reaction to the Victoza, he needs to stop this. Since we are having a tough time getting his diabetes under control, I will refer him to Endocrine .  As for refills on hydrocodone, I can send in one more month but he will have to have a PMV after that per state law.

## 2018-08-22 NOTE — Telephone Encounter (Signed)
Called and spoke with pt and he is aware of meds to stop the victoza and to start back on the actos 45 mg.  Pt is aware that he will need to have a PMV for another refill of the pain meds.

## 2018-08-22 NOTE — Telephone Encounter (Signed)
Pt states that he just had a PMV on 08/09/2018. Is this correct? I do not see this documented. It looks as thought that visit on 2/4 and 2/11 was to manage his BS. Pt refused to make an appt because he was just here two weeks in a row.   Pt also states that he is having a reaction to th Victoza -- pt is broke out in a rash, head to toe. Large Splotchy red patches. Good news though is that his BS is down! Pt wife is wanting to know, "what next?" since his BS is low but he is reacting to the medication.   Please advise. Thanks.

## 2018-08-22 NOTE — Telephone Encounter (Signed)
Pt's spouse called in to follow up on refill request.   Please advise.

## 2018-08-22 NOTE — Telephone Encounter (Signed)
Duplicate message.  See other note from today sent to Dr. Clent Ridges to address this and another issue.

## 2018-08-22 NOTE — Telephone Encounter (Signed)
He needs a PMV 

## 2018-08-22 NOTE — Telephone Encounter (Signed)
Copied from CRM [id number]. Topic: General - Other >> Aug 22, 2018  8:31 AM Herby Abraham C wrote: Reason for CRM: pt's spouse called in to be advised by PCP. Pt just started taking liraglutide (VICTOZA) 18 MG/3ML SOPN, spouse says that pt skin has broken out into a rash like he's allergic to medication. Spouse would like to know if provider should change medication?   Please advise. '  CB: [phone]

## 2018-08-31 ENCOUNTER — Other Ambulatory Visit: Payer: Self-pay

## 2018-08-31 ENCOUNTER — Encounter: Payer: Self-pay | Admitting: Internal Medicine

## 2018-08-31 ENCOUNTER — Ambulatory Visit (INDEPENDENT_AMBULATORY_CARE_PROVIDER_SITE_OTHER): Payer: Medicare PPO | Admitting: Internal Medicine

## 2018-08-31 VITALS — BP 132/80 | HR 60 | Ht 70.0 in | Wt 219.0 lb

## 2018-08-31 DIAGNOSIS — E1142 Type 2 diabetes mellitus with diabetic polyneuropathy: Secondary | ICD-10-CM

## 2018-08-31 DIAGNOSIS — E1165 Type 2 diabetes mellitus with hyperglycemia: Secondary | ICD-10-CM | POA: Diagnosis not present

## 2018-08-31 DIAGNOSIS — E1159 Type 2 diabetes mellitus with other circulatory complications: Secondary | ICD-10-CM | POA: Diagnosis not present

## 2018-08-31 MED ORDER — PIOGLITAZONE HCL 45 MG PO TABS: 45.0000 mg | ORAL_TABLET | Freq: Every day | ORAL | 3 refills | Status: DC

## 2018-08-31 MED ORDER — METFORMIN HCL 1000 MG PO TABS: 1000.0000 mg | ORAL_TABLET | Freq: Two times a day (BID) | ORAL | 3 refills | Status: DC

## 2018-08-31 NOTE — Patient Instructions (Addendum)
-    You have done a great job with working on [employer], keep up the good work. - Continue to check your sugar 2 times a day (fasting and bedtime)  - Continue Metformin 1000 mg Twice a day  - Continue Actos 45 mg daily    - Exercise  (brisk walking ) 150 minutes a week   Choose healthy, lower carb lower calorie snacks: toss salad, cooked vegetables, cottage cheese, peanut butter, low fat cheese / string cheese, lower sodium deli meat, tuna salad or chicken salad

## 2018-08-31 NOTE — Progress Notes (Signed)
Name: [name]  MRN/ DOB: [id number], [birth year]   Age/ Sex: 68 y.o., male    PCP: Nelwyn Salisbury, MD   Reason for Endocrinology Evaluation: Type 2 Diabetes Mellitus     Date of Initial Endocrinology Visit: 08/31/2018     PATIENT IDENTIFIER: [name] is a 68 y.o. male with a past medical history of T2DM,CAD (S/P CABG). The patient presented for initial endocrinology clinic visit on 08/31/2018 for consultative assistance with his diabetes management.    HPI: [name] is accompanied by his wife today    Diagnosed with T2DM 2010 Prior Medications tried/Intolerance: Victoza- rash, Januvia -ineffective  stopped last week (08/2018) Currently checking blood sugars 2 x / day,  before breakfast and bedtime  Hypoglycemia episodes : no               Hemoglobin A1c has ranged from 6.1% in 2017, peaking at 10.4% in 2019. Patient required assistance for hypoglycemia: no Patient has required hospitalization within the last 1 year from hyper or hypoglycemia: no  In terms of diet, the patient has changed his diet dramatically since January 2020 when he realizes his A1c above goal.   HOME DIABETES REGIMEN: Metformin 1000 mg BID Actos 45 mg daily    Statin: No- Intolerant . He is on Zetia and Livalo  ACE-I/ARB: Yes Prior Diabetic Education: yes   GLUCOSE LOG: Date Fasting Evening   08/31/18 140   2/25 160 141  2/24 143 207  2/23 127 138  2/22 120   2/21 126 122  2/20 127 125    DIABETIC COMPLICATIONS: Microvascular complications:   Neuropathy   Denies: ckd , retinopathy   Last eye exam: Completed 2018  Macrovascular complications:   CAD  Denies: PVD, CVA   PAST HISTORY: Past Medical History:  Past Medical History:  Diagnosis Date  . CAD (coronary artery disease)    nuclear stress test-09/09/11 low risk scan EF61%, sees Dr. Tresa Endo   . Chronic headache   . Chronic neck pain   . Diabetes mellitus   . Hyperlipidemia   . Hypertension 12/26/08     ECHO- EF>55%  . Insomnia     Past Surgical History:  Past Surgical History:  Procedure Laterality Date  . APPENDECTOMY    . cervical spine injection    . COLONOSCOPY  04-02-06    per [facility], clear, repeat in 10 yrs  . CORONARY ARTERY BYPASS GRAFT  2002   4 vessels      Social History:  reports that he quit smoking about 47 years ago. His smoking use included cigarettes. He has a 2.50 pack-year smoking history. He has never used smokeless tobacco. He reports that he does not drink alcohol or use drugs. Family History:  Family History  Problem Relation Age of Onset  . Liver cancer Father   . Cancer Brother   . Cancer Brother   . Cancer Brother   . Coronary artery disease Other   . Hypertension Other   . Heart disease Mother   . Colon cancer Neg Hx   . Stomach cancer Neg Hx   . Esophageal cancer Neg Hx       HOME MEDICATIONS: Allergies as of 08/31/2018      Reactions   Codeine Anaphylaxis   Pt. verbalized that he has taken and can take Hydrocodone, Oxycodone w/o issue - codeine allergy only   Crestor [rosuvastatin]    Myalgia    Isosorbide Nitrate Other (See  Comments)   Headaches   Lipitor [atorvastatin]    myalgia   Zolpidem Tartrate    REACTION: HA's   Ciprofloxacin Hives, Rash   Victoza [liraglutide] Rash      Medication List       Accurate as of August 31, 2018  8:40 AM. Always use your most recent med list.        ACCU-CHEK AVIVA PLUS test strip Generic drug:  glucose blood TEST SUGAR ONCE A DAY AND DIAGNOSIS CODE IS E 11.9   amLODipine 5 MG tablet Commonly known as:  NORVASC TAKE 1 TABLET BY MOUTH EVERY DAY   aspirin 81 MG tablet Take 81 mg by mouth daily.   azelastine 0.1 % nasal spray Commonly known as:  ASTELIN Place 2 sprays into both nostrils 2 (two) times daily. Use in each nostril as directed   BYSTOLIC 10 MG tablet Generic drug:  nebivolol TAKE 1 TABLET BY MOUTH EVERY DAY   CALCIUM 600+D PO Take 1 tablet by mouth daily.    clonazePAM 0.5 MG tablet Commonly known as:  KLONOPIN TAKE 1 TABLET (0.5 MG TOTAL) BY MOUTH 2 (TWO) TIMES DAILY AS NEEDED FOR ANXIETY.   CoQ10 100 MG Caps Take 100 mg by mouth daily.   ezetimibe 10 MG tablet Commonly known as:  ZETIA Take 1 tablet (10 mg total) by mouth daily.   fexofenadine 180 MG tablet Commonly known as:  ALLEGRA Take 180 mg by mouth daily as needed for allergies or rhinitis.   gabapentin 300 MG capsule Commonly known as:  NEURONTIN TAKE 3 CAPSULES BY MOUTH AT BEDTIME   HYDROcodone-acetaminophen 10-325 MG tablet Commonly known as:  NORCO Take 1 tablet by mouth every 6 (six) hours as needed for up to 30 days for moderate pain.   Lancets Misc Fast click lancet accu check may substitute for whever pt has   lisinopril 40 MG tablet Commonly known as:  PRINIVIL,ZESTRIL Take 1 tablet (40 mg total) by mouth daily.   LIVALO 4 MG Tabs Generic drug:  Pitavastatin Calcium TAKE 1 TABLET BY MOUTH EVERY DAY   meloxicam 15 MG tablet Commonly known as:  MOBIC TAKE 1 TABLET BY MOUTH EVERY DAY   metFORMIN 1000 MG tablet Commonly known as:  GLUCOPHAGE TAKE 1 TABLET BY MOUTH TWICE A DAY   nitroGLYCERIN 0.4 MG SL tablet Commonly known as:  NITROSTAT Place 1 tablet (0.4 mg total) under the tongue every 5 (five) minutes as needed for chest pain.   Omega-3 350 MG Cpdr Take 350 mg by mouth daily.   Pen Needles 31G X 8 MM Misc Use daily with victoza insulin   pioglitazone 45 MG tablet Commonly known as:  ACTOS Take 45 mg by mouth daily.   sulfamethoxazole-trimethoprim 800-160 MG tablet Commonly known as:  BACTRIM DS,SEPTRA DS Take 1 tablet by mouth 2 (two) times daily as needed (bladder infections).        ALLERGIES: Allergies  Allergen Reactions  . Codeine Anaphylaxis    Pt. verbalized that he has taken and can take Hydrocodone, Oxycodone w/o issue - codeine allergy only  . Crestor [Rosuvastatin]     Myalgia   . Isosorbide Nitrate Other (See  Comments)    Headaches  . Lipitor [Atorvastatin]     myalgia  . Zolpidem Tartrate     REACTION: HA's  . Ciprofloxacin Hives and Rash  . Victoza [Liraglutide] Rash     REVIEW OF SYSTEMS: A comprehensive ROS was conducted with the patient and is negative  except as per HPI and below:  Review of Systems  Constitutional: Negative for weight loss.  HENT: Negative for congestion and sore throat.   Eyes: Negative for blurred vision and photophobia.  Respiratory: Negative for cough and shortness of breath.   Cardiovascular: Negative for chest pain and palpitations.  Gastrointestinal: Negative for diarrhea and nausea.  Genitourinary: Negative for frequency.  Skin: Negative.   Neurological: Positive for tingling.  Endo/Heme/Allergies: Negative for polydipsia.  Psychiatric/Behavioral: Negative for depression. The patient is not nervous/anxious.       OBJECTIVE:   VITAL SIGNS: BP 132/80 (BP Location: Right Arm, Patient Position: Sitting, Cuff Size: Normal)   Pulse 60   Ht  (1.778 m)   Wt 219 lb (99.3 kg)   SpO2 96%   BMI 31.42 kg/m    PHYSICAL EXAM:  General: Pt appears well and is in NAD  Hydration: Well-hydrated with moist mucous membranes and good skin turgor  HEENT: Head: Unremarkable with good dentition. Oropharynx clear without exudate.  Eyes: External eye exam normal without stare, lid lag or exophthalmos.  EOM intact.  PERRL.  Neck: General: Supple without adenopathy or carotid bruits. Thyroid: Thyroid size normal.  No goiter or nodules appreciated. No thyroid bruit.  Lungs: Clear with good BS bilat with no rales, rhonchi, or wheezes  Heart: RRR with normal S1 and S2 and no gallops; no murmurs; no rub  Abdomen: Normoactive bowel sounds, soft, nontender, without masses or organomegaly palpable  Extremities:  Lower extremities - No pretibial edema. No lesions.  Skin: Normal texture and temperature to palpation. No rash noted. No Acanthosis nigricans/skin tags. No  lipohypertrophy.  Neuro: MS is good with appropriate affect, pt is alert and Ox3    DM foot exam: 08/31/2018 The skin of the feet is intact without sores or ulcerations. The pedal pulses are 2+ on right and 2+ on left. The sensation is intact to a screening 5.07, 10 gram monofilament bilaterally   DATA REVIEWED:  Lab Results  Component Value Date   HGBA1C 8.5 (A) 08/09/2018   HGBA1C 8.4 (H) 05/10/2018   HGBA1C 10.4 (H) 02/18/2018   Lab Results  Component Value Date   MICROALBUR 0.5 08/21/2008   LDLCALC 88 04/05/2018   CREATININE 0.93 04/05/2018   Lab Results  Component Value Date   MICRALBCREAT 3.1 08/21/2008    Lab Results  Component Value Date   CHOL 153 04/05/2018   HDL 49 04/05/2018   LDLCALC 88 04/05/2018   LDLDIRECT 128.0 10/29/2011   TRIG 81 04/05/2018   CHOLHDL 3.1 04/05/2018        ASSESSMENT / PLAN / RECOMMENDATIONS:   1) Type 2 Diabetes Mellitus, Poorly controlled, With neuropathic and macrovascular complications - Most recent A1c of  %. Goal A1c < 7.5 %.    Plan: GENERAL: I have discussed with the patient the pathophysiology of diabetes. We went over the natural progression of the disease. We talked about both insulin resistance and insulin deficiency. We stressed the importance of lifestyle changes including diet and exercise. I explained the complications associated with diabetes including retinopathy, nephropathy, neuropathy as well as increased risk of cardiovascular disease. We went over the benefit seen with glycemic control.   I explained to the patient that diabetic patients are at higher than normal risk for amputations. The patient was informed that diabetes is the number one cause of non-traumatic amputations in [place].   Praised the patient on recent lifestyle changes, in review of his glucose data today they  have been optimal.  I would not be making any changes, will continue with lifestyle changes and his med list as below.  He understand  he is already maxed out on metformin and pioglitazone, if the patient needs an additional medication, a GLP-1 agonist or an SGLT-2 inhibitor will be preferable due to cardioprotection.   MEDICATIONS:  Continue metformin 1000 mg twice daily  Continue Actos 45 mg daily  EDUCATION / INSTRUCTIONS:  BG monitoring instructions: Patient is instructed to check his blood sugars 2 times a day, fasting and bedtime.  Call [facility] if: BG persistently < 70 or > 300. . I reviewed the Rule of 15 for the treatment of hypoglycemia in detail with the patient. Literature supplied.   2) Diabetic complications:   Eye: Does not have known diabetic retinopathy.  He was strongly urged to see an ophthalmologist ASAP  Neuro/ Feet: Does have known diabetic peripheral neuropathy.  Renal: Patient does not have known baseline CKD. He is on an ACEI/ARB at present.   3) Lipids: Patient is intolerant to statin. Pt on zetia on Livalo which are inferior to statins in cardioprotection. Thus, if he will need an add on glycemic agents , I would prefer ones with cardioprotective advantage.      4) Hypertension: He is  at goal of < 140/90 mmHg.     F/u in 3 months   Signed electronically by: Lyndle Herrlich, MD  [facility] Endocrinology  [facility] [address] [place] [place], [place] [address] Phone: [phone] FAX: [phone]   CC: Nelwyn Salisbury, MD [address] [place] [address] Phone: [phone]  Fax: [phone]    Return to Endocrinology clinic as below: Future Appointments  Date Time Provider Department Center  08/31/2018  9:10 AM [name], Thomas Dolores, MD [facility] None  12/20/2018  8:20 AM Thomas Bihari, MD [facility]

## 2018-09-02 NOTE — Telephone Encounter (Signed)
received notification from appeal that it was denied, please advise on another medication. Thanks!

## 2018-09-05 NOTE — Telephone Encounter (Signed)
Please provide denial letter for further assessment.

## 2018-09-24 ENCOUNTER — Other Ambulatory Visit: Payer: Self-pay | Admitting: Family Medicine

## 2018-10-10 ENCOUNTER — Other Ambulatory Visit: Payer: Self-pay | Admitting: Family Medicine

## 2018-10-14 ENCOUNTER — Other Ambulatory Visit: Payer: Self-pay | Admitting: Family Medicine

## 2018-10-17 NOTE — Telephone Encounter (Signed)
Dr. Fry please advise on refill. Thanks  

## 2018-10-17 NOTE — Telephone Encounter (Signed)
Call in #60 with 5 rf 

## 2018-10-18 MED ORDER — CLONAZEPAM 0.5 MG PO TABS: 0.5000 mg | ORAL_TABLET | Freq: Two times a day (BID) | ORAL | 5 refills | Status: DC | PRN

## 2018-10-18 NOTE — Telephone Encounter (Signed)
Refill called to the pharmacy and left on the VM 

## 2018-10-25 ENCOUNTER — Other Ambulatory Visit: Payer: Self-pay

## 2018-10-25 ENCOUNTER — Encounter: Payer: Self-pay | Admitting: Internal Medicine

## 2018-10-25 ENCOUNTER — Telehealth: Payer: Self-pay | Admitting: Internal Medicine

## 2018-10-25 ENCOUNTER — Encounter: Payer: Medicare PPO | Admitting: Internal Medicine

## 2018-10-25 NOTE — Telephone Encounter (Signed)
Scheduled for this afternoon Doxy visit

## 2018-10-25 NOTE — Telephone Encounter (Signed)
Patients wife has called in advising that her husbands blood sugars have been running above 200 & 300, she states at the last visit Dr. Lonzo Cloud told to inform this if it keep acquiring.   Please Advise, Thanks

## 2018-10-25 NOTE — Progress Notes (Deleted)
Virtual Visit via Video Note  I connected with [name] on 10/25/18 at  1:40 PM EDT by a video enabled telemedicine application and verified that I am speaking with the correct person using two identifiers.   I discussed the limitations of evaluation and management by telemedicine and the availability of in person appointments. The patient expressed understanding and agreed to proceed.   -Location of the patient : -Location of the provider : Office  -The names of all persons participating in the telemedicine service : CMA Lelon Frohlich        Name: Maxemiliano MIKA RAJARAM   Age/ Sex: 68 y.o. male   MRN/ DOB: [id number]/ [birth year]     PCP: Nelwyn Salisbury, MD   Reason for Endocrinology Evaluation: Type 2 Diabetes Mellitus  Initial Endocrine Consultative Visit: 08/31/2018    PATIENT IDENTIFIER: [name] is a 68 y.o. male with a past medical history of T2DM,CAD (S/P CABG). The patient has followed with Endocrinology clinic since 08/31/2018 for consultative assistance with management of his diabetes.  DIABETIC HISTORY:  Mr. Tienken was diagnosed with T2DM in 2010, he has been on Metformin and Actos for many years, he is intolerant to Victoza due to rash. Januvia has been ineffective , which he stopped 08/2018. He has never been on insulin . His hemoglobin A1c has ranged from 6.1% in 2017, peaking at 10.4% in 2019  On his initial presentation his A1c was 8.5% . He was on Metformin and Actos  SUBJECTIVE:   During the last visit (08/31/2018): A1c 8.5%. we did not make any changes and continued Metformin and Actos.   Today (10/25/2018): Mr. Madeira is here for a virtual follow up visit on his diabetes.  He checks his blood sugars 2 times daily, preprandial to breakfast and bedtime. The patient has not had hypoglycemic episodes since the last clinic visit. Otherwise, the patient has not required any recent emergency interventions for hypoglycemia and has not had recent  hospitalizations secondary to hyper or hypoglycemic episodes.    ROS: As per HPI and as detailed below: ROS    HOME DIABETES REGIMEN:  Metformin 1000 mg BID  Actos 45 mg daily     GLUCOSE LOG : 10/25/18  138 a.m. 10/24/18  178 a.m.  250 p.m. 10/23/18  191 a.m. 255 p.m. 10/22/18  255 p.m. 10/21/18  228 a.m. 279 p.m.    HISTORY:  Past Medical History:  Past Medical History:  Diagnosis Date  . CAD (coronary artery disease)    nuclear stress test-09/09/11 low risk scan EF61%, sees Dr. Tresa Endo   . Chronic headache   . Chronic neck pain   . Diabetes mellitus   . Hyperlipidemia   . Hypertension 12/26/08   ECHO- EF>55%  . Insomnia     Past Surgical History:  Past Surgical History:  Procedure Laterality Date  . APPENDECTOMY    . cervical spine injection    . COLONOSCOPY  04-02-06    per [facility], clear, repeat in 10 yrs  . CORONARY ARTERY BYPASS GRAFT  2002   4 vessels     Social History:  reports that he quit smoking about 47 years ago. His smoking use included cigarettes. He has a 2.50 pack-year smoking history. He has never used smokeless tobacco. He reports that he does not drink alcohol or use drugs. Family History:  Family History  Problem Relation Age of Onset  . Liver cancer Father   . Cancer Brother   .  Cancer Brother   . Cancer Brother   . Coronary artery disease Other   . Hypertension Other   . Heart disease Mother   . Colon cancer Neg Hx   . Stomach cancer Neg Hx   . Esophageal cancer Neg Hx       HOME MEDICATIONS: Allergies as of 10/25/2018      Reactions   Codeine Anaphylaxis   Pt. verbalized that he has taken and can take Hydrocodone, Oxycodone w/o issue - codeine allergy only   Crestor [rosuvastatin]    Myalgia    Isosorbide Nitrate Other (See Comments)   Headaches   Lipitor [atorvastatin]    myalgia   Zolpidem Tartrate    REACTION: HA's   Ciprofloxacin Hives, Rash   Victoza [liraglutide] Rash      Medication List       Accurate  as of October 25, 2018 10:17 AM. Always use your most recent med list.        Accu-Chek Aviva Plus test strip Generic drug:  glucose blood TEST SUGAR ONCE A DAY AND DIAGNOSIS CODE IS E 11.9   amLODipine 5 MG tablet Commonly known as:  NORVASC TAKE 1 TABLET BY MOUTH EVERY DAY   aspirin 81 MG tablet Take 81 mg by mouth daily.   azelastine 0.1 % nasal spray Commonly known as:  ASTELIN Place 2 sprays into both nostrils 2 (two) times daily. Use in each nostril as directed   Bystolic 10 MG tablet Generic drug:  nebivolol TAKE 1 TABLET BY MOUTH EVERY DAY   CALCIUM 600+D PO Take 1 tablet by mouth daily.   clonazePAM 0.5 MG tablet Commonly known as:  KLONOPIN Take 1 tablet (0.5 mg total) by mouth 2 (two) times daily as needed for anxiety.   CoQ10 100 MG Caps Take 100 mg by mouth daily.   ezetimibe 10 MG tablet Commonly known as:  ZETIA Take 1 tablet (10 mg total) by mouth daily.   fexofenadine 180 MG tablet Commonly known as:  ALLEGRA Take 180 mg by mouth daily as needed for allergies or rhinitis.   gabapentin 300 MG capsule Commonly known as:  NEURONTIN TAKE 3 CAPSULES BY MOUTH AT BEDTIME   Lancets Misc Fast click lancet accu check may substitute for whever pt has   lisinopril 40 MG tablet Commonly known as:  ZESTRIL Take 1 tablet (40 mg total) by mouth daily.   Livalo 4 MG Tabs Generic drug:  Pitavastatin Calcium TAKE 1 TABLET BY MOUTH EVERY DAY   meloxicam 15 MG tablet Commonly known as:  MOBIC TAKE 1 TABLET BY MOUTH EVERY DAY   metFORMIN 1000 MG tablet Commonly known as:  GLUCOPHAGE Take 1 tablet (1,000 mg total) by mouth 2 (two) times daily.   nitroGLYCERIN 0.4 MG SL tablet Commonly known as:  NITROSTAT Place 1 tablet (0.4 mg total) under the tongue every 5 (five) minutes as needed for chest pain.   Omega-3 350 MG Cpdr Take 350 mg by mouth daily.   Pen Needles 31G X 8 MM Misc Use daily with victoza insulin   pioglitazone 45 MG tablet Commonly known  as:  ACTOS Take 1 tablet (45 mg total) by mouth daily.   sulfamethoxazole-trimethoprim 800-160 MG tablet Commonly known as:  BACTRIM DS Take 1 tablet by mouth 2 (two) times daily as needed (bladder infections).         DATA REVIEWED:  Lab Results  Component Value Date   HGBA1C 8.5 (A) 08/09/2018   HGBA1C 8.4 (H)  05/10/2018   HGBA1C 10.4 (H) 02/18/2018   Lab Results  Component Value Date   MICROALBUR 0.5 08/21/2008   LDLCALC 88 04/05/2018   CREATININE 0.93 04/05/2018   Lab Results  Component Value Date   MICRALBCREAT 3.1 08/21/2008     Lab Results  Component Value Date   CHOL 153 04/05/2018   HDL 49 04/05/2018   LDLCALC 88 04/05/2018   LDLDIRECT 128.0 10/29/2011   TRIG 81 04/05/2018   CHOLHDL 3.1 04/05/2018         ASSESSMENT / PLAN / RECOMMENDATIONS:   1) Type 2 Diabetes Mellitus, Poorly controlled, With neuropathic and macrovascular complications - Most recent A1c of 8.5 %. Goal A1c < 7.5 %.    Plan: MEDICATIONS:  ***  EDUCATION / INSTRUCTIONS:  BG monitoring instructions: Patient is instructed to check his blood sugars *** times a day, ***.  Call [facility] if: BG persistently < 70 or > 300. . I reviewed the Rule of 15 for the treatment of hypoglycemia in detail with the patient. Literature supplied.       I discussed the assessment and treatment plan with the patient. The patient was provided an opportunity to ask questions and all were answered. The patient agreed with the plan and demonstrated an understanding of the instructions.   The patient was advised to call back or seek an in-person evaluation if the symptoms worsen or if the condition fails to improve as anticipated.  I provided *** minutes of non-face-to-face time during this encounter.   F/U in ***    Signed electronically by: Lyndle Herrlich, MD  [facility] Endocrinology  [facility] [address] [place] [place], [place] [address]  Phone: [phone] FAX: [phone]   CC: Nelwyn Salisbury, MD [address] [place] [address] Phone: [phone]  Fax: [phone]  Return to Endocrinology clinic as below: Future Appointments  Date Time Provider Department Center  10/25/2018  1:40 PM Shamleffer, Konrad Dolores, MD [facility] None  11/30/2018  9:10 AM Shamleffer, Konrad Dolores, MD [facility] None  12/20/2018  8:20 AM Lennette Bihari, MD [facility]

## 2018-10-25 NOTE — Telephone Encounter (Signed)
Spoke to pt wife and she stated that his readings have been as follows: 10/25/18  138 a.m. 10/24/18  178 a.m.  250 p.m. 10/23/18  191 a.m. 255 p.m. 10/22/18  255 p.m. 10/21/18  228 a.m. 279 p.m. She stated that pt is not on any new medications or steroids and has not been sick and that he has been taking his medications metformin 1000mg  2 times daily and actos 45mg  once daily Please advise

## 2018-10-26 ENCOUNTER — Ambulatory Visit (INDEPENDENT_AMBULATORY_CARE_PROVIDER_SITE_OTHER): Payer: Medicare PPO | Admitting: Internal Medicine

## 2018-10-26 ENCOUNTER — Encounter: Payer: Self-pay | Admitting: Internal Medicine

## 2018-10-26 VITALS — BP 142/72 | HR 72 | Temp 97.4°F | Ht 70.0 in | Wt 219.2 lb

## 2018-10-26 DIAGNOSIS — E1165 Type 2 diabetes mellitus with hyperglycemia: Secondary | ICD-10-CM

## 2018-10-26 DIAGNOSIS — E1159 Type 2 diabetes mellitus with other circulatory complications: Secondary | ICD-10-CM | POA: Diagnosis not present

## 2018-10-26 DIAGNOSIS — E1142 Type 2 diabetes mellitus with diabetic polyneuropathy: Secondary | ICD-10-CM

## 2018-10-26 DIAGNOSIS — E119 Type 2 diabetes mellitus without complications: Secondary | ICD-10-CM | POA: Diagnosis not present

## 2018-10-26 MED ORDER — DAPAGLIFLOZIN PROPANEDIOL 10 MG PO TABS: 10.0000 mg | ORAL_TABLET | Freq: Every day | ORAL | 11 refills | Status: DC

## 2018-10-26 MED ORDER — DAPAGLIFLOZIN PROPANEDIOL 5 MG PO TABS: 5.0000 mg | ORAL_TABLET | Freq: Every day | ORAL | 0 refills | Status: DC

## 2018-10-26 NOTE — Progress Notes (Signed)
Name: [name]   Age/ Sex: 68 y.o. male   MRN/ DOB: [id number]/ 05-22-1951     PCP: Nelwyn SalisburyFry, Stephen A, MD   Reason for Endocrinology Evaluation: Type 2 Diabetes Mellitus  Initial Endocrine Consultative Visit: 08/31/2018    PATIENT IDENTIFIER: [name] is a 68 y.o. male with a past medical history of T2DM,CAD (S/P CABG). The patient has followed with Endocrinology clinic since 08/31/2018 for consultative assistance with management of his diabetes.  DIABETIC HISTORY:  [name] was diagnosed with T2DM in 2010, he has been on Metformin and Actos for many years, he is intolerant to Victoza due to rash. Januvia has been ineffective , which he stopped 08/2018. He has never been on insulin . His hemoglobin A1c has ranged from 6.1% in 2017, peaking at 10.4% in 2019  On his initial presentation his A1c was 8.5% . He was on Metformin and Actos   SUBJECTIVE:   During the last visit (08/31/2018): A1c 8.5%. HIs BG's were in the mid 100's.We did not make any changes and continued Metformin and Actos.   Today (10/26/2018): [name] is here with his wife for a  follow up visit on his diabetes.  He checks his blood sugars 2 times daily, preprandial to breakfast and bedtime. The patient has not had hypoglycemic episodes since the last clinic visit. Otherwise, the patient has not required any recent emergency interventions for hypoglycemia and has not had recent hospitalizations secondary to hyper or hypoglycemic episodes.    ROS: As per HPI and as detailed below: Review of Systems  Constitutional: Negative for fever.  HENT: Negative for congestion and sore throat.   Respiratory: Negative for cough and shortness of breath.   Cardiovascular: Negative for chest pain and palpitations.  Gastrointestinal: Negative for diarrhea and nausea.      HOME DIABETES REGIMEN:  Metformin 1000 mg BID  Actos 45 mg daily     GLUCOSE LOG : 10/25/18  138 a.m. 10/24/18  178 a.m.  250  p.m. 10/23/18  191 a.m. 255 p.m. 10/22/18  255 p.m. 10/21/18  228 a.m. 279 p.m.    HISTORY:  Past Medical History:  Past Medical History:  Diagnosis Date  . CAD (coronary artery disease)    nuclear stress test-09/09/11 low risk scan EF61%, sees Dr. Tresa EndoKelly   . Chronic headache   . Chronic neck pain   . Diabetes mellitus   . Hyperlipidemia   . Hypertension 12/26/08   ECHO- EF>55%  . Insomnia    Past Surgical History:  Past Surgical History:  Procedure Laterality Date  . APPENDECTOMY    . cervical spine injection    . COLONOSCOPY  04-02-06    per [facility], clear, repeat in 10 yrs  . CORONARY ARTERY BYPASS GRAFT  2002   4 vessels    Social History:  reports that he quit smoking about 47 years ago. His smoking use included cigarettes. He has a 2.50 pack-year smoking history. He has never used smokeless tobacco. He reports that he does not drink alcohol or use drugs. Family History:  Family History  Problem Relation Age of Onset  . Liver cancer Father   . Cancer Brother   . Cancer Brother   . Cancer Brother   . Coronary artery disease Other   . Hypertension Other   . Heart disease Mother   . Colon cancer Neg Hx   . Stomach cancer Neg Hx   . Esophageal cancer Neg Hx  HOME MEDICATIONS: Allergies as of 10/26/2018      Reactions   Codeine Anaphylaxis   Pt. verbalized that he has taken and can take Hydrocodone, Oxycodone w/o issue - codeine allergy only   Crestor [rosuvastatin]    Myalgia    Isosorbide Nitrate Other (See Comments)   Headaches   Lipitor [atorvastatin]    myalgia   Zolpidem Tartrate    REACTION: HA's   Ciprofloxacin Hives, Rash   Victoza [liraglutide] Rash      Medication List       Accurate as of October 26, 2018  7:49 AM. Always use your most recent med list.        Accu-Chek Aviva Plus test strip Generic drug:  glucose blood TEST SUGAR ONCE A DAY AND DIAGNOSIS CODE IS E 11.9   amLODipine 5 MG tablet Commonly known as:  NORVASC TAKE 1  TABLET BY MOUTH EVERY DAY   aspirin 81 MG tablet Take 81 mg by mouth daily.   azelastine 0.1 % nasal spray Commonly known as:  ASTELIN Place 2 sprays into both nostrils 2 (two) times daily. Use in each nostril as directed   Bystolic 10 MG tablet Generic drug:  nebivolol TAKE 1 TABLET BY MOUTH EVERY DAY   CALCIUM 600+D PO Take 1 tablet by mouth daily.   clonazePAM 0.5 MG tablet Commonly known as:  KLONOPIN Take 1 tablet (0.5 mg total) by mouth 2 (two) times daily as needed for anxiety.   CoQ10 100 MG Caps Take 100 mg by mouth daily.   ezetimibe 10 MG tablet Commonly known as:  ZETIA Take 1 tablet (10 mg total) by mouth daily.   fexofenadine 180 MG tablet Commonly known as:  ALLEGRA Take 180 mg by mouth daily as needed for allergies or rhinitis.   gabapentin 300 MG capsule Commonly known as:  NEURONTIN TAKE 3 CAPSULES BY MOUTH AT BEDTIME   Lancets Misc Fast click lancet accu check may substitute for whever pt has   lisinopril 40 MG tablet Commonly known as:  ZESTRIL Take 1 tablet (40 mg total) by mouth daily.   Livalo 4 MG Tabs Generic drug:  Pitavastatin Calcium TAKE 1 TABLET BY MOUTH EVERY DAY   meloxicam 15 MG tablet Commonly known as:  MOBIC TAKE 1 TABLET BY MOUTH EVERY DAY   metFORMIN 1000 MG tablet Commonly known as:  GLUCOPHAGE Take 1 tablet (1,000 mg total) by mouth 2 (two) times daily.   nitroGLYCERIN 0.4 MG SL tablet Commonly known as:  NITROSTAT Place 1 tablet (0.4 mg total) under the tongue every 5 (five) minutes as needed for chest pain.   Omega-3 350 MG Cpdr Take 350 mg by mouth daily.   Pen Needles 31G X 8 MM Misc Use daily with victoza insulin   pioglitazone 45 MG tablet Commonly known as:  ACTOS Take 1 tablet (45 mg total) by mouth daily.   sulfamethoxazole-trimethoprim 800-160 MG tablet Commonly known as:  BACTRIM DS Take 1 tablet by mouth 2 (two) times daily as needed (bladder infections).      PHYSICAL EXAM: VS: There were  no vitals taken for this visit.   EXAM: General: Pt appears well and is in NAD  Lungs: Clear with good BS bilat with no rales, rhonchi, or wheezes  Heart: Auscultation: RRR with normal S1 and S2, no gallops or murmurs Carotid arteries: no bruits Periph. circulation: no peripheral edema  Abdomen: Normoactive bowel sounds, soft, nontender, without masses or organomegaly palpable  Extremities: BL LE: no  pretibial edema normal ROM and strength, no joint enlargement or tenderness  Skin: Hair: texture and amount normal with gender appropriate distribution Skin Inspection: no rashes. Skin Palpation: skin temperature, texture, and thickness normal to palpation  Neuro: Cranial nerves: II - XII grossly intact ;  Motor: normal strength throughout DTRs: 2+ and symmetric in UE without delay in relaxation phase  Mental Status: Judgment, insight: intact Orientation: oriented to time, place, and person Mood and affect: no depression, anxiety, or agitation      DATA REVIEWED:  Lab Results  Component Value Date   HGBA1C 8.5 (A) 08/09/2018   HGBA1C 8.4 (H) 05/10/2018   HGBA1C 10.4 (H) 02/18/2018   Lab Results  Component Value Date   MICROALBUR 0.5 08/21/2008   LDLCALC 88 04/05/2018   CREATININE 0.93 04/05/2018   Lab Results  Component Value Date   MICRALBCREAT 3.1 08/21/2008     Lab Results  Component Value Date   CHOL 153 04/05/2018   HDL 49 04/05/2018   LDLCALC 88 04/05/2018   LDLDIRECT 128.0 10/29/2011   TRIG 81 04/05/2018   CHOLHDL 3.1 04/05/2018         ASSESSMENT / PLAN / RECOMMENDATIONS:   1) Type 2 Diabetes Mellitus, Poorly controlled, With neuropathic and macrovascular complications - Most recent A1c of 8.5 %. Goal A1c < 7.5 %.     Plan: - His BG's have been trending up to the 200's.  He has gained a few lbs since the last visit . Since he is already maxed out on metformin and actos , we have discussed add on therapy. - We discussed SU such as glipizide, we  discussed the risk of hypoglycemia and weight gain and the benefit of glucose control and affordability. We also discussed SGLT-2 inhibitors and their benefit in reducing glucose, BP, and weight . We discussed the cardiovascular benefit of this class especially given his intolerability to statins and his inability to get their benefit. We also discussed the risk of genital infections, genital ulceration and even gangrene, he was advised to perform a visual check with any genital discomfort.  - We also discussed trying another GLP-1 agonist such as ozempic or trulicity despite his allergic reaction to victoza.  - Pt agreed to trying SGLT-2 inhibitors at this time   MEDICATIONS:  Farxiga 5 mg daily, if no side effects in 4 weeks, increase to 10 mg daily  Continue Metformin 1000 mg BID  Continue Actos 45 mg daily   EDUCATION / INSTRUCTIONS:  BG monitoring instructions: Patient is instructed to check his blood sugars 2 times a day, fasting and bedtime.  Call [facility] clinic if: BG persistently < 70 or > 300. . I reviewed the Rule of 15 for the treatment of hypoglycemia in detail with the patient. Literature supplied.      F/U in 4 weeks    Signed electronically by: Lyndle Herrlich, MD  [facility]  [facility] [address] [place] [place], [place] [address] Phone: [phone] FAX: [phone]   CC: Nelwyn Salisbury, MD [address] [place] [address] Phone: [phone]  Fax: [phone]  Return to Endocrinology clinic as below: Future Appointments  Date Time Provider Department Center  10/26/2018  9:50 AM Shamleffer, Konrad Dolores, MD [facility] None  11/30/2018  9:10 AM Shamleffer, Konrad Dolores, MD [facility] None  12/20/2018  8:20 AM Lennette Bihari, MD [facility]

## 2018-10-26 NOTE — Progress Notes (Deleted)
Virtual Visit via Video Note  I connected with [name] on 10/26/18 at  1:40 PM EDT by a video enabled telemedicine application and verified that I am speaking with the correct person using two identifiers.   I discussed the limitations of evaluation and management by telemedicine and the availability of in person appointments. The patient expressed understanding and agreed to proceed.   -Location of the patient : -Location of the provider : Office  -The names of all persons participating in the telemedicine service : CMA Lelon Frohlich        Name: [name]   Age/ Sex: 68 y.o. male   MRN/ DOB: [id number]/ 02-23-1951     PCP: Nelwyn Salisbury, MD   Reason for Endocrinology Evaluation: Type 2 Diabetes Mellitus  Initial Endocrine Consultative Visit: 08/31/2018    PATIENT IDENTIFIER: [name] is a 68 y.o. male with a past medical history of T2DM,CAD (S/P CABG). The patient has followed with Endocrinology clinic since 08/31/2018 for consultative assistance with management of his diabetes.  DIABETIC HISTORY:  [name] was diagnosed with T2DM in 2010, he has been on Metformin and Actos for many years, he is intolerant to Victoza due to rash. Januvia has been ineffective , which he stopped 08/2018. He has never been on insulin . His hemoglobin A1c has ranged from 6.1% in 2017, peaking at 10.4% in 2019  On his initial presentation his A1c was 8.5% . He was on Metformin and Actos  SUBJECTIVE:   During the last visit (08/31/2018): A1c 8.5%. we did not make any changes and continued Metformin and Actos.   Today (10/26/2018): [name] is here for a virtual follow up visit on his diabetes.  He checks his blood sugars 2 times daily, preprandial to breakfast and bedtime. The patient has not had hypoglycemic episodes since the last clinic visit. Otherwise, the patient has not required any recent emergency interventions for hypoglycemia and has not had recent  hospitalizations secondary to hyper or hypoglycemic episodes.    ROS: As per HPI and as detailed below: ROS    HOME DIABETES REGIMEN:  Metformin 1000 mg BID  Actos 45 mg daily     GLUCOSE LOG : 10/25/18  138 a.m. 10/24/18  178 a.m.  250 p.m. 10/23/18  191 a.m. 255 p.m. 10/22/18  255 p.m. 10/21/18  228 a.m. 279 p.m.    HISTORY:  Past Medical History:  Past Medical History:  Diagnosis Date  . CAD (coronary artery disease)    nuclear stress test-09/09/11 low risk scan EF61%, sees Dr. Tresa Endo   . Chronic headache   . Chronic neck pain   . Diabetes mellitus   . Hyperlipidemia   . Hypertension 12/26/08   ECHO- EF>55%  . Insomnia    Past Surgical History:  Past Surgical History:  Procedure Laterality Date  . APPENDECTOMY    . cervical spine injection    . COLONOSCOPY  04-02-06    per [facility], clear, repeat in 10 yrs  . CORONARY ARTERY BYPASS GRAFT  2002   4 vessels    Social History:  reports that he quit smoking about 47 years ago. His smoking use included cigarettes. He has a 2.50 pack-year smoking history. He has never used smokeless tobacco. He reports that he does not drink alcohol or use drugs. Family History:  Family History  Problem Relation Age of Onset  . Liver cancer Father   . Cancer Brother   . Cancer Brother   .  Cancer Brother   . Coronary artery disease Other   . Hypertension Other   . Heart disease Mother   . Colon cancer Neg Hx   . Stomach cancer Neg Hx   . Esophageal cancer Neg Hx      HOME MEDICATIONS: Allergies as of 10/25/2018      Reactions   Codeine Anaphylaxis   Pt. verbalized that he has taken and can take Hydrocodone, Oxycodone w/o issue - codeine allergy only   Crestor [rosuvastatin]    Myalgia    Isosorbide Nitrate Other (See Comments)   Headaches   Lipitor [atorvastatin]    myalgia   Zolpidem Tartrate    REACTION: HA's   Ciprofloxacin Hives, Rash   Victoza [liraglutide] Rash      Medication List       Accurate as of  October 25, 2018 11:59 PM. Always use your most recent med list.        Accu-Chek Aviva Plus test strip Generic drug:  glucose blood TEST SUGAR ONCE A DAY AND DIAGNOSIS CODE IS E 11.9   amLODipine 5 MG tablet Commonly known as:  NORVASC TAKE 1 TABLET BY MOUTH EVERY DAY   aspirin 81 MG tablet Take 81 mg by mouth daily.   azelastine 0.1 % nasal spray Commonly known as:  ASTELIN Place 2 sprays into both nostrils 2 (two) times daily. Use in each nostril as directed   Bystolic 10 MG tablet Generic drug:  nebivolol TAKE 1 TABLET BY MOUTH EVERY DAY   CALCIUM 600+D PO Take 1 tablet by mouth daily.   clonazePAM 0.5 MG tablet Commonly known as:  KLONOPIN Take 1 tablet (0.5 mg total) by mouth 2 (two) times daily as needed for anxiety.   CoQ10 100 MG Caps Take 100 mg by mouth daily.   ezetimibe 10 MG tablet Commonly known as:  ZETIA Take 1 tablet (10 mg total) by mouth daily.   fexofenadine 180 MG tablet Commonly known as:  ALLEGRA Take 180 mg by mouth daily as needed for allergies or rhinitis.   gabapentin 300 MG capsule Commonly known as:  NEURONTIN TAKE 3 CAPSULES BY MOUTH AT BEDTIME   Lancets Misc Fast click lancet accu check may substitute for whever pt has   lisinopril 40 MG tablet Commonly known as:  ZESTRIL Take 1 tablet (40 mg total) by mouth daily.   Livalo 4 MG Tabs Generic drug:  Pitavastatin Calcium TAKE 1 TABLET BY MOUTH EVERY DAY   meloxicam 15 MG tablet Commonly known as:  MOBIC TAKE 1 TABLET BY MOUTH EVERY DAY   metFORMIN 1000 MG tablet Commonly known as:  GLUCOPHAGE Take 1 tablet (1,000 mg total) by mouth 2 (two) times daily.   nitroGLYCERIN 0.4 MG SL tablet Commonly known as:  NITROSTAT Place 1 tablet (0.4 mg total) under the tongue every 5 (five) minutes as needed for chest pain.   Omega-3 350 MG Cpdr Take 350 mg by mouth daily.   Pen Needles 31G X 8 MM Misc Use daily with victoza insulin   pioglitazone 45 MG tablet Commonly known as:   ACTOS Take 1 tablet (45 mg total) by mouth daily.   sulfamethoxazole-trimethoprim 800-160 MG tablet Commonly known as:  BACTRIM DS Take 1 tablet by mouth 2 (two) times daily as needed (bladder infections).         DATA REVIEWED:  Lab Results  Component Value Date   HGBA1C 8.5 (A) 08/09/2018   HGBA1C 8.4 (H) 05/10/2018   HGBA1C 10.4 (H)  02/18/2018   Lab Results  Component Value Date   MICROALBUR 0.5 08/21/2008   LDLCALC 88 04/05/2018   CREATININE 0.93 04/05/2018   Lab Results  Component Value Date   MICRALBCREAT 3.1 08/21/2008     Lab Results  Component Value Date   CHOL 153 04/05/2018   HDL 49 04/05/2018   LDLCALC 88 04/05/2018   LDLDIRECT 128.0 10/29/2011   TRIG 81 04/05/2018   CHOLHDL 3.1 04/05/2018         ASSESSMENT / PLAN / RECOMMENDATIONS:   1) Type 2 Diabetes Mellitus, Poorly controlled, With neuropathic and macrovascular complications - Most recent A1c of 8.5 %. Goal A1c < 7.5 %.    Plan: MEDICATIONS:  ***  EDUCATION / INSTRUCTIONS:  BG monitoring instructions: Patient is instructed to check his blood sugars *** times a day, ***.  Call [place] [facility] if: BG persistently < 70 or > 300. . I reviewed the Rule of 15 for the treatment of hypoglycemia in detail with the patient. Literature supplied.       I discussed the assessment and treatment plan with the patient. The patient was provided an opportunity to ask questions and all were answered. The patient agreed with the plan and demonstrated an understanding of the instructions.   The patient was advised to call back or seek an in-person evaluation if the symptoms worsen or if the condition fails to improve as anticipated.  I provided *** minutes of non-face-to-face time during this encounter.   F/U in ***    Signed electronically by: Lyndle HerrlichAbby Jaralla Quinne Pires, MD  [facility]  [facility] [address] [place], [place] [address] Phone:  [phone] FAX: [phone]   CC: Nelwyn SalisburyFry, Stephen A, MD [address] [place] [address] Phone: [phone]  Fax: [phone]  Return to Endocrinology clinic as below: Future Appointments  Date Time Provider Department Center  11/30/2018  9:10 AM Hadeel Hillebrand, Konrad DoloresIbtehal Jaralla, MD [facility] None  12/20/2018  8:20 AM Lennette BihariKelly, Thomas A, MD [facility]

## 2018-10-26 NOTE — Patient Instructions (Signed)
-   Continue Metformin 1000 mg Twice a day  - Continue Actos 45 mg daily  - Start Farxiga 5 mg once a day for a month, if no side effects, please increase to 10 mg daily    - Exercise  (brisk walking ) 150 minutes a week   Choose healthy, lower carb lower calorie snacks: toss salad, cooked vegetables, cottage cheese, peanut butter, low fat cheese / string cheese, lower sodium deli meat, tuna salad or chicken salad

## 2018-10-31 ENCOUNTER — Other Ambulatory Visit: Payer: Self-pay | Admitting: Family Medicine

## 2018-10-31 NOTE — Telephone Encounter (Signed)
Requested medication (s) are due for refill today: Yes  Requested medication (s) are on the active medication list: No  Last refill:  08/22/18  Future visit scheduled: No  Notes to clinic:  Unable to refill, cannot delegate     Requested Prescriptions  Pending Prescriptions Disp Refills   HYDROcodone-acetaminophen (NORCO) 10-325 MG tablet 120 tablet 0    Sig: Take 1 tablet by mouth every 6 (six) hours as needed for up to 30 days for moderate pain.     Not Delegated - Analgesics:  Opioid Agonist Combinations Failed - 10/31/2018 11:56 AM      Failed - This refill cannot be delegated      Failed - Urine Drug Screen completed in last 360 days.      Passed - Valid encounter within last 6 months    Recent Outpatient Visits          2 months ago Type 2 diabetes mellitus without complication, without long-term current use of insulin (HCC)   [facility], Tera Mater, MD   2 months ago Type 2 diabetes mellitus without complication, without long-term current use of insulin (HCC)   [facility]ult [facility], Tera Mater, MD   5 months ago Chronic narcotic use   [facility], Tera Mater, MD   6 months ago Type 2 diabetes mellitus without complication, without long-term current use of insulin (HCC)   Adult nurse [facility], Tera Mater, MD   7 months ago Type 2 diabetes mellitus without complication, without long-term current use of insulin (HCC)   Adult [facility], Tera Mater, MD

## 2018-11-01 NOTE — Telephone Encounter (Signed)
Copied from CRM [id number]. Topic: Quick Communication - Rx Refill/Question >> Nov 01, 2018 10:07 AM Wyonia Hough E wrote: Medication: HYDROcodone-acetaminophen (NORCO) 10-325 MG tablet   Has the patient contacted their pharmacy? No   Preferred Pharmacy (with phone number or street name): [id number] [place], [place] - [address] [phone] (Phone) [phone] (Fax)    Agent: Please be advised that RX refills may take up to 3 business days. We ask that you follow-up with your pharmacy.

## 2018-11-02 ENCOUNTER — Other Ambulatory Visit: Payer: Self-pay

## 2018-11-02 ENCOUNTER — Ambulatory Visit (INDEPENDENT_AMBULATORY_CARE_PROVIDER_SITE_OTHER): Payer: Medicare PPO | Admitting: Family Medicine

## 2018-11-02 ENCOUNTER — Encounter: Payer: Self-pay | Admitting: Family Medicine

## 2018-11-02 DIAGNOSIS — F1190 Opioid use, unspecified, uncomplicated: Secondary | ICD-10-CM

## 2018-11-02 DIAGNOSIS — M8949 Other hypertrophic osteoarthropathy, multiple sites: Secondary | ICD-10-CM

## 2018-11-02 DIAGNOSIS — M159 Polyosteoarthritis, unspecified: Secondary | ICD-10-CM

## 2018-11-02 DIAGNOSIS — M150 Primary generalized (osteo)arthritis: Secondary | ICD-10-CM | POA: Diagnosis not present

## 2018-11-02 MED ORDER — HYDROCODONE-ACETAMINOPHEN 10-325 MG PO TABS: 1.0000 | ORAL_TABLET | Freq: Four times a day (QID) | ORAL | 0 refills | Status: DC | PRN

## 2018-11-02 MED ORDER — HYDROCODONE-ACETAMINOPHEN 10-325 MG PO TABS: 1.0000 | ORAL_TABLET | Freq: Four times a day (QID) | ORAL | 0 refills | Status: AC | PRN

## 2018-11-02 NOTE — Progress Notes (Signed)
Subjective:    Patient ID: [name], male    DOB: 1950/07/24, 68 y.o.   MRN: [id number]  HPI Virtual Visit via Video Note  I connected with the patient on 11/02/18 at  9:00 AM EDT by a video enabled telemedicine application and verified that I am speaking with the correct person using two identifiers.  Location patient: home Location provider:work or home office Persons participating in the virtual visit: patient, provider  I discussed the limitations of evaluation and management by telemedicine and the availability of in person appointments. The patient expressed understanding and agreed to proceed.   HPI: Here for pain management. He is doing well.  Indication for chronic opioid: osteoarthritis Medication and dose: Norco 10-325 # pills per month: 120 Last UDS date: 07-30-17 Opioid Treatment Agreement signed (Y/N): 11-01-17 Opioid Treatment Agreement last reviewed with patient:  11-02-18 [facility] reviewed this encounter (include red flags):  11-02-18    ROS: See pertinent positives and negatives per HPI.  Past Medical History:  Diagnosis Date   CAD (coronary artery disease)    nuclear stress test-09/09/11 low risk scan EF61%, sees Dr. Tresa EndoKelly    Chronic headache    Chronic neck pain    Diabetes mellitus    Hyperlipidemia    Hypertension 12/26/08   ECHO- EF>55%   Insomnia     Past Surgical History:  Procedure Laterality Date   APPENDECTOMY     cervical spine injection     COLONOSCOPY  04-02-06    per [facility], clear, repeat in 10 yrs   CORONARY ARTERY BYPASS GRAFT  2002   4 vessels    Family History  Problem Relation Age of Onset   Liver cancer Father    Cancer Brother    Cancer Brother    Cancer Brother    Coronary artery disease Other    Hypertension Other    Heart disease Mother    Colon cancer Neg Hx    Stomach cancer Neg Hx    Esophageal cancer Neg Hx      Current Outpatient Medications:    ACCU-CHEK AVIVA PLUS test  strip, TEST SUGAR ONCE A DAY AND DIAGNOSIS CODE IS E 11.9, Disp: 100 each, Rfl: 1   amLODipine (NORVASC) 5 MG tablet, TAKE 1 TABLET BY MOUTH EVERY DAY, Disp: 90 tablet, Rfl: 0   aspirin 81 MG tablet, Take 81 mg by mouth daily. , Disp: , Rfl:    azelastine (ASTELIN) 0.1 % nasal spray, Place 2 sprays into both nostrils 2 (two) times daily. Use in each nostril as directed, Disp: 30 mL, Rfl: 11   BYSTOLIC 10 MG tablet, TAKE 1 TABLET BY MOUTH EVERY DAY, Disp: 90 tablet, Rfl: 3   Calcium Carbonate-Vitamin D (CALCIUM 600+D PO), Take 1 tablet by mouth daily., Disp: , Rfl:    clonazePAM (KLONOPIN) 0.5 MG tablet, Take 1 tablet (0.5 mg total) by mouth 2 (two) times daily as needed for anxiety., Disp: 60 tablet, Rfl: 5   Coenzyme Q10 (COQ10) 100 MG CAPS, Take 100 mg by mouth daily. , Disp: , Rfl:    dapagliflozin propanediol (FARXIGA) 10 MG TABS tablet, Take 10 mg by mouth daily., Disp: 30 tablet, Rfl: 11   dapagliflozin propanediol (FARXIGA) 5 MG TABS tablet, Take 5 mg by mouth daily., Disp: 30 tablet, Rfl: 0   fexofenadine (ALLEGRA) 180 MG tablet, Take 180 mg by mouth daily as needed for allergies or rhinitis., Disp: , Rfl:    gabapentin (NEURONTIN) 300 MG capsule,  TAKE 3 CAPSULES BY MOUTH AT BEDTIME, Disp: 270 capsule, Rfl: 0   Insulin Pen Needle (PEN NEEDLES) 31G X 8 MM MISC, Use daily with victoza insulin, Disp: 50 each, Rfl: 6   Lancets MISC, Fast click lancet accu check may substitute for whever pt has, Disp: 100 each, Rfl: 3   lisinopril (PRINIVIL,ZESTRIL) 40 MG tablet, Take 1 tablet (40 mg total) by mouth daily., Disp: 90 tablet, Rfl: 3   meloxicam (MOBIC) 15 MG tablet, TAKE 1 TABLET BY MOUTH EVERY DAY, Disp: 90 tablet, Rfl: 3   metFORMIN (GLUCOPHAGE) 1000 MG tablet, Take 1 tablet (1,000 mg total) by mouth 2 (two) times daily., Disp: 180 tablet, Rfl: 3   Omega-3 350 MG CPDR, Take 350 mg by mouth daily., Disp: , Rfl:    pioglitazone (ACTOS) 45 MG tablet, Take 1 tablet (45 mg total) by  mouth daily., Disp: 90 tablet, Rfl: 3   sulfamethoxazole-trimethoprim (BACTRIM DS,SEPTRA DS) 800-160 MG tablet, Take 1 tablet by mouth 2 (two) times daily as needed (bladder infections)., Disp: , Rfl:    ezetimibe (ZETIA) 10 MG tablet, Take 1 tablet (10 mg total) by mouth daily., Disp: 90 tablet, Rfl: 3   [START ON 01/02/2019] HYDROcodone-acetaminophen (NORCO) 10-325 MG tablet, Take 1 tablet by mouth every 6 (six) hours as needed for up to 30 days for moderate pain., Disp: 120 tablet, Rfl: 0   nitroGLYCERIN (NITROSTAT) 0.4 MG SL tablet, Place 1 tablet (0.4 mg total) under the tongue every 5 (five) minutes as needed for chest pain., Disp: 25 tablet, Rfl: 6  EXAM:  VITALS per patient if applicable:  GENERAL: alert, oriented, appears well and in no acute distress  HEENT: atraumatic, conjunttiva clear, no obvious abnormalities on inspection of external nose and ears  NECK: normal movements of the head and neck  LUNGS: on inspection no signs of respiratory distress, breathing rate appears normal, no obvious gross SOB, gasping or wheezing  CV: no obvious cyanosis  MS: moves all visible extremities without noticeable abnormality  PSYCH/NEURO: pleasant and cooperative, no obvious depression or anxiety, speech and thought processing grossly intact  ASSESSMENT AND PLAN: Pain management, meds were refilled.  Gershon Crane, MD  Discussed the following assessment and plan:  No diagnosis found.     I discussed the assessment and treatment plan with the patient. The patient was provided an opportunity to ask questions and all were answered. The patient agreed with the plan and demonstrated an understanding of the instructions.   The patient was advised to call back or seek an in-person evaluation if the symptoms worsen or if the condition fails to improve as anticipated.     Review of Systems     Objective:   Physical Exam        Assessment & Plan:

## 2018-11-13 ENCOUNTER — Other Ambulatory Visit: Payer: Self-pay | Admitting: Cardiovascular Disease

## 2018-11-14 NOTE — Telephone Encounter (Signed)
Amlodipine 5 mg refilled. 

## 2018-11-15 ENCOUNTER — Other Ambulatory Visit: Payer: Self-pay | Admitting: Internal Medicine

## 2018-11-24 ENCOUNTER — Other Ambulatory Visit: Payer: Self-pay

## 2018-11-25 ENCOUNTER — Other Ambulatory Visit: Payer: Self-pay | Admitting: Internal Medicine

## 2018-11-30 ENCOUNTER — Other Ambulatory Visit: Payer: Self-pay

## 2018-11-30 ENCOUNTER — Ambulatory Visit (INDEPENDENT_AMBULATORY_CARE_PROVIDER_SITE_OTHER): Payer: Medicare PPO | Admitting: Internal Medicine

## 2018-11-30 ENCOUNTER — Encounter: Payer: Self-pay | Admitting: Internal Medicine

## 2018-11-30 DIAGNOSIS — E1165 Type 2 diabetes mellitus with hyperglycemia: Secondary | ICD-10-CM

## 2018-11-30 DIAGNOSIS — E1142 Type 2 diabetes mellitus with diabetic polyneuropathy: Secondary | ICD-10-CM

## 2018-11-30 DIAGNOSIS — E1159 Type 2 diabetes mellitus with other circulatory complications: Secondary | ICD-10-CM

## 2018-11-30 DIAGNOSIS — E119 Type 2 diabetes mellitus without complications: Secondary | ICD-10-CM

## 2018-11-30 NOTE — Progress Notes (Signed)
Virtual Visit via Video Note  I connected with Cayleb Joyice Faster on 11/30/18 at 9:10 AM by a video enabled telemedicine application and verified that I am speaking with the correct person using two identifiers.   I discussed the limitations of evaluation and management by telemedicine and the availability of in person appointments. The patient expressed understanding and agreed to proceed.  -Location of the patient : Home -Location of the provider : office -The names of all persons participating in the telemedicine service : Pt , his wife and myself       Name: [name]   Age/ Sex: 68 y.o. male   MRN/ DOB: [id number]/ Dec 12, 1950     PCP: Nelwyn Salisbury, MD   Reason for Endocrinology Evaluation: Type 2 Diabetes Mellitus  Initial Endocrine Consultative Visit: 08/31/2018    PATIENT IDENTIFIER: [name] is a 68 y.o. male with a past medical history of T2DM,CAD (S/P CABG). The patient has followed with Endocrinology clinic since 08/31/2018 for consultative assistance with management of his diabetes.  DIABETIC HISTORY:  Mr. Haberland was diagnosed with T2DM in 2010, he has been on Metformin and Actos for many years, he is intolerant to Victoza due to rash. Januvia has been ineffective , which he stopped 08/2018. He has never been on insulin . His hemoglobin A1c has ranged from 6.1% in 2017, peaking at 10.4% in 2019  On his initial presentation his A1c was 8.5% . He was on Metformin and Actos   SUBJECTIVE:   During the last visit (10/26/2018): He was started on Farxiga, he was continued on Metformin and Actos.     Today (11/30/2018): Mr. Yeates is here for a 4 week virtual  follow up visit on his diabetes.  He checks his blood sugars 2 times daily, preprandial to breakfast and bedtime. The patient has not had hypoglycemic episodes since the last clinic visit. Otherwise, the patient has not required any recent emergency interventions for hypoglycemia and has not had  recent hospitalizations secondary to hyper or hypoglycemic episodes.   He did go fishing one day and didn;t eat all day, by the time he got home, he ate donuts and his BG went up to 277 mg/dL.   He denies any side effects to [place]    ROS: As per HPI and as detailed below: Review of Systems  Respiratory: Negative for cough and shortness of breath.   Cardiovascular: Negative for chest pain and palpitations.  Gastrointestinal: Negative for diarrhea and nausea.  Genitourinary: Negative for frequency.  Neurological: Negative for dizziness.  Endo/Heme/Allergies: Negative for polydipsia.      HOME DIABETES REGIMEN:  Metformin 1000 mg BID  Actos 45 mg daily  Farxiga 10 mg daily    GLUCOSE LOG :                     AM          Dinner 11/30/18       122 11/28/18       121           277 (after eating donuts) 11/27/18       164 11/26/18       123 11/25/18       129          135    HISTORY:  Past Medical History:  Past Medical History:  Diagnosis Date   CAD (coronary artery disease)    nuclear stress test-09/09/11 low risk scan EF61%,  sees Dr. Tresa EndoKelly    Chronic headache    Chronic neck pain    Diabetes mellitus    Hyperlipidemia    Hypertension 12/26/08   ECHO- EF>55%   Insomnia    Past Surgical History:  Past Surgical History:  Procedure Laterality Date   APPENDECTOMY     cervical spine injection     COLONOSCOPY  04-02-06    per [facility], clear, repeat in 10 yrs   CORONARY ARTERY BYPASS GRAFT  2002   4 vessels    Social History:  reports that he quit smoking about 48 years ago. His smoking use included cigarettes. He has a 2.50 pack-year smoking history. He has never used smokeless tobacco. He reports that he does not drink alcohol or use drugs. Family History:  Family History  Problem Relation Age of Onset   Liver cancer Father    Cancer Brother    Cancer Brother    Cancer Brother    Coronary artery disease Other    Hypertension Other    Heart  disease Mother    Colon cancer Neg Hx    Stomach cancer Neg Hx    Esophageal cancer Neg Hx      HOME MEDICATIONS: Allergies as of 11/30/2018      Reactions   Codeine Anaphylaxis   Pt. verbalized that he has taken and can take Hydrocodone, Oxycodone w/o issue - codeine allergy only   Crestor [rosuvastatin]    Myalgia    Isosorbide Nitrate Other (See Comments)   Headaches   Lipitor [atorvastatin]    myalgia   Zolpidem Tartrate    REACTION: HA's   Ciprofloxacin Hives, Rash   Victoza [liraglutide] Rash      Medication List       Accurate as of Nov 30, 2018  8:19 AM. If you have any questions, ask your nurse or doctor.        Accu-Chek Aviva Plus test strip Generic drug:  glucose blood TEST SUGAR ONCE A DAY AND DIAGNOSIS CODE IS E 11.9   amLODipine 5 MG tablet Commonly known as:  NORVASC TAKE 1 TABLET BY MOUTH EVERY DAY   aspirin 81 MG tablet Take 81 mg by mouth daily.   azelastine 0.1 % nasal spray Commonly known as:  ASTELIN Place 2 sprays into both nostrils 2 (two) times daily. Use in each nostril as directed   Bystolic 10 MG tablet Generic drug:  nebivolol TAKE 1 TABLET BY MOUTH EVERY DAY   CALCIUM 600+D PO Take 1 tablet by mouth daily.   clonazePAM 0.5 MG tablet Commonly known as:  KLONOPIN Take 1 tablet (0.5 mg total) by mouth 2 (two) times daily as needed for anxiety.   CoQ10 100 MG Caps Take 100 mg by mouth daily.   dapagliflozin propanediol 10 MG Tabs tablet Commonly known as:  Farxiga Take 10 mg by mouth daily.   ezetimibe 10 MG tablet Commonly known as:  ZETIA Take 1 tablet (10 mg total) by mouth daily.   fexofenadine 180 MG tablet Commonly known as:  ALLEGRA Take 180 mg by mouth daily as needed for allergies or rhinitis.   gabapentin 300 MG capsule Commonly known as:  NEURONTIN TAKE 3 CAPSULES BY MOUTH AT BEDTIME   HYDROcodone-acetaminophen 10-325 MG tablet Commonly known as:  NORCO Take 1 tablet by mouth every 6 (six) hours as  needed for up to 30 days for moderate pain. Start taking on:  January 02, 2019   Lancets Misc Fast click lancet  accu check may substitute for whever pt has   lisinopril 40 MG tablet Commonly known as:  ZESTRIL Take 1 tablet (40 mg total) by mouth daily.   meloxicam 15 MG tablet Commonly known as:  MOBIC TAKE 1 TABLET BY MOUTH EVERY DAY   metFORMIN 1000 MG tablet Commonly known as:  GLUCOPHAGE Take 1 tablet (1,000 mg total) by mouth 2 (two) times daily.   nitroGLYCERIN 0.4 MG SL tablet Commonly known as:  NITROSTAT Place 1 tablet (0.4 mg total) under the tongue every 5 (five) minutes as needed for chest pain.   Omega-3 350 MG Cpdr Take 350 mg by mouth daily.   Pen Needles 31G X 8 MM Misc Use daily with victoza insulin   pioglitazone 45 MG tablet Commonly known as:  ACTOS Take 1 tablet (45 mg total) by mouth daily.   sulfamethoxazole-trimethoprim 800-160 MG tablet Commonly known as:  BACTRIM DS Take 1 tablet by mouth 2 (two) times daily as needed (bladder infections).           DATA REVIEWED:  Lab Results  Component Value Date   HGBA1C 8.5 (A) 08/09/2018   HGBA1C 8.4 (H) 05/10/2018   HGBA1C 10.4 (H) 02/18/2018    Lab Results  Component Value Date   CHOL 153 04/05/2018   HDL 49 04/05/2018   LDLCALC 88 04/05/2018   LDLDIRECT 128.0 10/29/2011   TRIG 81 04/05/2018   CHOLHDL 3.1 04/05/2018         ASSESSMENT / PLAN / RECOMMENDATIONS:   1) Type 2 Diabetes Mellitus, Poorly controlled, With neuropathic and macrovascular complications - Most recent A1c of 8.5 %. Goal A1c < 7.5 %.     Plan: - His BG's have been trending down and for the most part in the optimal range. Encouraged pt to continue with lifestyle changes.  - He is tolerating [place] well without side effects   MEDICATIONS:  Continue Farxiga 10 mg daily  Continue Metformin 1000 mg BID  Continue Actos 45 mg daily   EDUCATION / INSTRUCTIONS:  BG monitoring instructions: Patient is instructed  to check his blood sugars 2 times a day, fasting and bedtime.  Call [facility] if: BG persistently < 70 or > 300.  I reviewed the Rule of 15 for the treatment of hypoglycemia in detail with the patient. Literature supplied.   2) Diabetic complications:   Eye: Does not have known diabetic retinopathy.  He was strongly urged to see an ophthalmologist ASAP  Neuro/ Feet: Does have known diabetic peripheral neuropathy.  Renal: Patient does not have known baseline CKD. He is on an ACEI/ARB at present.   3) Lipids: Patient is intolerant to statin. Pt on zetia and Livalo which are inferior to statins in cardioprotection. Marcelline Deist is a great addition given cardioprotective benefits.   I discussed the assessment and treatment plan with the patient. The patient was provided an opportunity to ask questions and all were answered. The patient agreed with the plan and demonstrated an understanding of the instructions.   The patient was advised to call back or seek an in-person evaluation if the symptoms worsen or if the condition fails to improve as anticipated.  F/U in 3 months    Signed electronically by: Lyndle Herrlich, MD  [facility]  [facility] [address] [place] [place], [place] [address] Phone: [phone] FAX: [phone]   CC: Nelwyn Salisbury, MD [address] [place] [address] Phone: [phone]  Fax: [phone]  Return to Endocrinology clinic as below: Future Appointments  Date Time Provider Department Center  11/30/2018  9:10 AM Jkayla Spiewak, Konrad Dolores, MD [facility] None  12/20/2018  8:20 AM Lennette Bihari, MD [facility]

## 2018-12-14 ENCOUNTER — Telehealth: Payer: Self-pay | Admitting: Physician Assistant

## 2018-12-14 NOTE — Telephone Encounter (Signed)
smartphone/ consent/ my chart/ pre reg completed °

## 2018-12-18 ENCOUNTER — Other Ambulatory Visit: Payer: Self-pay | Admitting: Family Medicine

## 2018-12-19 NOTE — Progress Notes (Addendum)
Virtual Visit via Video Note   This visit type was conducted due to national recommendations for restrictions regarding the COVID-19 Pandemic (e.g. social distancing) in an effort to limit this patient's exposure and mitigate transmission in our community.  Due to his co-morbid illnesses, this patient is at least at moderate risk for complications without adequate follow up.  This format is felt to be most appropriate for this patient at this time.  All issues noted in this document were discussed and addressed.  Bennett limited physical exam was performed with this format.  Please refer to the patient's chart for his consent to telehealth for [name].   Date:  12/20/2018   ID:  [name], DOB September 15, 1950, MRN [id number]  Patient Location: Home Provider Location: Office  PCP:  Thomas SalisburyFry, Stephen A, MD  Cardiologist:  Nicki Guadalajarahomas Kelly, MD  Electrophysiologist:  None   Evaluation Performed:  Follow-Up Visit  Chief Complaint:  CAD, HLD  History of Present Illness:    [name] is Bennett 68 y.o. male with HTN, DM2, CAD, and HLD. 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 Echo 2010 with preserved EF, mild MR, and trace TR. Nuclear stress test 2013 negative for scar or ischemia. In 2015, he reported DOE, which is thought to be his anginal equivalent, and underwent echo and moview. Echo with preserved EF, grade 1 DD, mild MR and moderate left atrial dilatation. Nuclear stress test was read as low risk. He had Bennett repeat exercise myoview 05/05/18 for preoperative clearance - this was considered similar to prior stress test. Echocardiogram 05/2018 with preserved EF, grade 1 DD, and severe LVH. He was last seen by Dr. Tresa EndoKelly in clinic on 06/14/18 and was doing well at that time.   HTN controlled with amlodipine 5 mg, lisinopril 40 mg, nebivolol 10 mg. Unfortunately, he was unable to  pick up livalo due to cost ($600/month). He is taking zetia. He reports intolerance to lipitor and crestor.   He presents to virtual appt today for follow up. He is doing well. Blood pressure is well-controlled, HR controlled. He was supposed to have surgery on his shoulder, but was delayed for increased blood sugar. Last A1c was 8.5%. He is working with his PCP on this. He is taking ASA, no bleeding. He walks daily with his dogs. He lost his son in Bennett motorcycle accident in May.   The patient does not have symptoms concerning for COVID-19 infection (fever, chills, cough, or new shortness of breath).    Past Medical History:  Diagnosis Date  . CAD (coronary artery disease)    nuclear stress test-09/09/11 low risk scan EF61%, sees Dr. Tresa EndoKelly   . Chronic headache   . Chronic neck pain   . Diabetes mellitus   . Hyperlipidemia   . Hypertension 12/26/08   ECHO- EF>55%  . Insomnia    Past Surgical History:  Procedure Laterality Date  . APPENDECTOMY    . cervical spine injection    . COLONOSCOPY  04-02-06    per [facility], clear, repeat in 10 yrs  . CORONARY ARTERY BYPASS GRAFT  2002   4 vessels     Current Meds  Medication Sig  . ACCU-CHEK AVIVA PLUS test strip TEST SUGAR ONCE Bennett DAY AND DIAGNOSIS CODE IS E 11.9  . amLODipine (NORVASC) 5 MG tablet TAKE 1 TABLET BY  MOUTH EVERY DAY  . aspirin 81 MG tablet Take 81 mg by mouth daily.   Marland Kitchen BYSTOLIC 10 MG tablet TAKE 1 TABLET BY MOUTH EVERY DAY  . Calcium Carbonate-Vitamin D (CALCIUM 600+D PO) Take 1 tablet by mouth daily.  . clonazePAM (KLONOPIN) 0.5 MG tablet Take 1 tablet (0.5 mg total) by mouth 2 (two) times daily as needed for anxiety.  . Coenzyme Q10 (COQ10) 100 MG CAPS Take 100 mg by mouth daily.   . dapagliflozin propanediol (FARXIGA) 10 MG TABS tablet Take 10 mg by mouth daily.  Marland Kitchen ezetimibe (ZETIA) 10 MG tablet Take 1 tablet (10 mg total) by mouth daily.  . fexofenadine (ALLEGRA) 180 MG tablet Take 180 mg by mouth daily as needed  for allergies or rhinitis.  Marland Kitchen gabapentin (NEURONTIN) 300 MG capsule TAKE 3 CAPSULES BY MOUTH AT BEDTIME  . [START ON 01/02/2019] HYDROcodone-acetaminophen (NORCO) 10-325 MG tablet Take 1 tablet by mouth every 6 (six) hours as needed for up to 30 days for moderate pain.  . Insulin Pen Needle (PEN NEEDLES) 31G X 8 MM MISC Use daily with victoza insulin  . Lancets MISC Fast click lancet accu check may substitute for whever pt has  . lisinopril (PRINIVIL,ZESTRIL) 40 MG tablet Take 1 tablet (40 mg total) by mouth daily.  . meloxicam (MOBIC) 15 MG tablet TAKE 1 TABLET BY MOUTH EVERY DAY  . metFORMIN (GLUCOPHAGE) 1000 MG tablet Take 1 tablet (1,000 mg total) by mouth 2 (two) times daily.  . nitroGLYCERIN (NITROSTAT) 0.4 MG SL tablet Place 1 tablet (0.4 mg total) under the tongue every 5 (five) minutes as needed for chest pain.  . Omega-3 350 MG CPDR Take 350 mg by mouth daily.  . pioglitazone (ACTOS) 45 MG tablet Take 1 tablet (45 mg total) by mouth daily.  Marland Kitchen sulfamethoxazole-trimethoprim (BACTRIM DS,SEPTRA DS) 800-160 MG tablet Take 1 tablet by mouth 2 (two) times daily as needed (bladder infections).     Allergies:   Codeine, Crestor [rosuvastatin], Isosorbide nitrate, Lipitor [atorvastatin], Zolpidem tartrate, Ciprofloxacin, and Victoza [liraglutide]   Social History   Tobacco Use  . Smoking status: Former Smoker    Packs/day: 0.25    Years: 10.00    Pack years: 2.50    Types: Cigarettes    Quit date: 11/26/1970    Years since quitting: 48.0  . Smokeless tobacco: Never Used  Substance Use Topics  . Alcohol use: No    Alcohol/week: 0.0 standard drinks  . Drug use: No     Family Hx: The patient's family history includes Cancer in his brother, brother, and brother; Coronary artery disease in an other family member; Heart disease in his mother; Hypertension in an other family member; Liver cancer in his father. There is no history of Colon cancer, Stomach cancer, or Esophageal cancer.  ROS:    Please see the history of present illness.     All other systems reviewed and are negative.   Prior CV studies:   The following studies were reviewed today:  Echo 05/30/18: Study Conclusions - Left ventricle: The cavity size was normal. Wall thickness was   increased in Bennett pattern of severe LVH. Systolic function was   normal. The estimated ejection fraction was in the range of 55%   to 60%. Doppler parameters are consistent with abnormal left   ventricular relaxation (grade 1 diastolic dysfunction). - Mitral valve: There was mild regurgitation. - Left atrium: The atrium was mildly dilated. - Pericardium, extracardiac: Bennett trivial pericardial effusion was  identified.   Exercise myoview 05/05/18:  The left ventricular ejection fraction is mildly decreased (45-54%).  Nuclear stress EF: 48%.  The study is normal.  This is Bennett low risk study.  Horizontal ST segment depression ST segment depression of 1.5 mm was noted during stress in the V2, V4, V5, V6, aVF, II, V3 and III leads, and returning to baseline after 5-9 minutes of recovery.   Low risk stress nuclear study with normal perfusion and mildly reduced left ventricular global systolic function. Marked ECG changes are seen during exercise and "balanced ischemia" cannot be entirely excluded. Suggest comparison of LV EF with echocardiogram.   Labs/Other Tests and Data Reviewed:    EKG:  An ECG dated 06/14/18 was personally reviewed today and demonstrated:  sinus rhythm, heart rate 61  Recent Labs: 02/18/2018: Hemoglobin 13.9; Platelets 166 04/05/2018: ALT 24; BUN 11; Creatinine, Ser 0.93; Potassium 3.8; Sodium 143   Recent Lipid Panel Lab Results  Component Value Date/Time   CHOL 153 04/05/2018 08:32 AM   CHOL 173 05/24/2013 10:49 AM   TRIG 81 04/05/2018 08:32 AM   TRIG 185 (H) 05/24/2013 10:49 AM   HDL 49 04/05/2018 08:32 AM   HDL 39 (L) 05/24/2013 10:49 AM   CHOLHDL 3.1 04/05/2018 08:32 AM   CHOLHDL 3.4  11/22/2017 08:46 AM   LDLCALC 88 04/05/2018 08:32 AM   LDLCALC 84 11/22/2017 08:46 AM   LDLCALC 97 05/24/2013 10:49 AM   LDLDIRECT 128.0 10/29/2011 10:28 AM    Wt Readings from Last 3 Encounters:  12/20/18 205 lb (93 kg)  10/26/18 219 lb 3.2 oz (99.4 kg)  08/31/18 219 lb (99.3 kg)     Objective:    Vital Signs:  BP 133/72   Pulse 60   Ht 5' 9.5" (1.765 m)   Wt 205 lb (93 kg)   BMI 29.84 kg/m    VITAL SIGNS:  reviewed GEN:  no acute distress EYES:  sclerae anicteric, EOMI - Extraocular Movements Intact RESPIRATORY:  normal respiratory effort, symmetric expansion CARDIOVASCULAR:  no peripheral edema SKIN:  no rash, lesions or ulcers. MUSCULOSKELETAL:  no obvious deformities. NEURO:  alert and oriented x 3, no obvious focal deficit PSYCH:  normal affect  ASSESSMENT & PLAN:    Hyperlidemia with LDL goal of < 70  04/05/2018: Cholesterol, Total 153; HDL 49; LDL Calculated 88; Triglycerides 81 Livalo + zetia (zetia started 10/19). Unfortunately, livalo was $600/month. He states he was working with our office on assistance, but this was never obtained? Will check Bennett fasting lipid profile. We discussed his LDL goal. If not at goal, will proceed with lipid clinic referral. He has been intolerant to lipitor and crestor in the past.  CAD No anginal symptoms. He is active and walks daily with his dogs. Continue ASA  Chronic diastolic heart failure He is euvolemic, no changes.  Hypertension Pressures are well-controlled.  Taking amlodipine 5 mg, lisinopril 40 mg, nebivolol 10 mg No changes.   COVID-19 Education: The signs and symptoms of COVID-19 were discussed with the patient and how to seek care for testing (follow up with PCP or arrange E-visit).  The importance of social distancing was discussed today.   Time:   Today, I have spent 15 minutes with the patient with telehealth technology discussing the above problems.     Medication Adjustments/Labs and Tests Ordered:  Current medicines are reviewed at length with the patient today.  Concerns regarding medicines are outlined above.   Tests Ordered: No orders of the  defined types were placed in this encounter.   Medication Changes: No orders of the defined types were placed in this encounter.   Follow Up:  Virtual Visit or In Person in 6 month(s)  Signed, Marcelino Dusterngela Nicole Arlin Sass, [place]  12/20/2018 9:43 AM    [facility]

## 2018-12-20 ENCOUNTER — Other Ambulatory Visit: Payer: Self-pay | Admitting: Physician Assistant

## 2018-12-20 ENCOUNTER — Telehealth (INDEPENDENT_AMBULATORY_CARE_PROVIDER_SITE_OTHER): Payer: Medicare PPO | Admitting: Physician Assistant

## 2018-12-20 ENCOUNTER — Encounter: Payer: Self-pay | Admitting: Physician Assistant

## 2018-12-20 ENCOUNTER — Telehealth: Payer: Medicare PPO | Admitting: Cardiovascular Disease

## 2018-12-20 VITALS — BP 133/72 | HR 60 | Ht 69.5 in | Wt 205.0 lb

## 2018-12-20 DIAGNOSIS — I25810 Atherosclerosis of coronary artery bypass graft(s) without angina pectoris: Secondary | ICD-10-CM

## 2018-12-20 DIAGNOSIS — I5032 Chronic diastolic (congestive) heart failure: Secondary | ICD-10-CM | POA: Diagnosis not present

## 2018-12-20 DIAGNOSIS — E785 Hyperlipidemia, unspecified: Secondary | ICD-10-CM | POA: Diagnosis not present

## 2018-12-20 DIAGNOSIS — I110 Hypertensive heart disease with heart failure: Secondary | ICD-10-CM | POA: Diagnosis not present

## 2018-12-20 DIAGNOSIS — I10 Essential (primary) hypertension: Secondary | ICD-10-CM

## 2018-12-20 NOTE — Patient Instructions (Signed)
Medication Instructions:  Your physician recommends that you continue on your current medications as directed. Please refer to the Current Medication list given to you today.  If you need a refill on your cardiac medications before your next appointment, please call your pharmacy.   Lab work: Your physician recommends that you return for a FASTING lipid profile and hepatic function test: this week--No appointment is needed. Your lab slips will be here in the office with our lab technician.  If you have labs (blood work) drawn today and your tests are completely normal, you will receive your results only by: Marland Kitchen MyChart Message (if you have MyChart) OR . A paper copy in the mail If you have any lab test that is abnormal or we need to change your treatment, we will call you to review the results.   Follow-Up: At [facility], you and your health needs are our priority.  As part of our continuing mission to provide you with exceptional heart care, we have created designated Provider Care Teams.  These Care Teams include your primary Cardiologist (physician) and Advanced Practice Providers (APPs -  Physician Assistants and Nurse Practitioners) who all work together to provide you with the care you need, when you need it. You will need a follow up appointment in 6 months.  Please call our office 2 months in advance to schedule this appointment.  You may see Shelva Majestic, MD or one of the following Advanced Practice Providers on your designated Care Team: [place], [place] . Fabian Sharp, PA-C  Any Other Special Instructions Will Be Listed Below (If Applicable). None

## 2018-12-22 DIAGNOSIS — I10 Essential (primary) hypertension: Secondary | ICD-10-CM | POA: Diagnosis not present

## 2018-12-22 DIAGNOSIS — E785 Hyperlipidemia, unspecified: Secondary | ICD-10-CM | POA: Diagnosis not present

## 2018-12-22 LAB — LIPID PANEL
Chol/HDL Ratio: 4.1 ratio (ref 0.0–5.0)
Cholesterol, Total: 199 mg/dL (ref 100–199)
HDL: 49 mg/dL (ref >39)
LDL Calculated: 110 mg/dL — ABNORMAL HIGH (ref 0–99)
Triglycerides: 198 mg/dL — ABNORMAL HIGH (ref 0–149)
VLDL Cholesterol Cal: 40 mg/dL (ref 5–40)

## 2018-12-22 LAB — COMPREHENSIVE METABOLIC PANEL
ALT: 24 IU/L (ref 0–44)
AST: 21 IU/L (ref 0–40)
Albumin/Globulin Ratio: 1.9 (ref 1.2–2.2)
Albumin: 4.6 g/dL (ref 3.8–4.8)
Alkaline Phosphatase: 84 IU/L (ref 39–117)
BUN/Creatinine Ratio: 16 (ref 10–24)
BUN: 14 mg/dL (ref 8–27)
Bilirubin Total: 0.3 mg/dL (ref 0.0–1.2)
CO2: 25 mmol/L (ref 20–29)
Calcium: 10.1 mg/dL (ref 8.6–10.2)
Chloride: 101 mmol/L (ref 96–106)
Creatinine, Ser: 0.9 mg/dL (ref 0.76–1.27)
GFR calc Af Amer: 100 mL/min/{1.73_m2} (ref >59)
GFR calc non Af Amer: 87 mL/min/{1.73_m2} (ref >59)
Globulin, Total: 2.4 g/dL (ref 1.5–4.5)
Glucose: 139 mg/dL — ABNORMAL HIGH (ref 65–99)
Potassium: 4.8 mmol/L (ref 3.5–5.2)
Sodium: 139 mmol/L (ref 134–144)
Total Protein: 7 g/dL (ref 6.0–8.5)

## 2018-12-26 ENCOUNTER — Telehealth: Payer: Self-pay | Admitting: Cardiovascular Disease

## 2018-12-26 NOTE — Telephone Encounter (Signed)
Called and spoke to wife- she was advised no more lab work needed for now, and be on the lookout for the appointment for [facility].  She verified understanding.

## 2018-12-26 NOTE — Telephone Encounter (Signed)
Spoke to patient's wife she stated husband received a letter in mail about his blood work they did not understand if he needs to have more blood work.After reviewing chart Jackquline Bosch PA recommended he needs lipid clinic appointment soon.Advised our scheduler will call back with appointment.

## 2018-12-26 NOTE — Telephone Encounter (Signed)
New Message     pts wife is calling and says he had lab work done last week and he got a letter in the mail today that says he needs to be seen again     Please call back

## 2018-12-26 NOTE — Telephone Encounter (Signed)
Follow up    Please call wife, Butch Penny

## 2018-12-29 NOTE — Telephone Encounter (Signed)
Telephone follow up scheduled for 6/30 at 11am

## 2019-01-03 ENCOUNTER — Telehealth: Payer: Self-pay | Admitting: Pharmacist

## 2019-01-03 MED ORDER — ROSUVASTATIN CALCIUM 10 MG PO TABS: 10.0000 mg | ORAL_TABLET | Freq: Every day | ORAL | 1 refills | Status: DC

## 2019-01-03 NOTE — Telephone Encounter (Signed)
This visit type was conducted due to national recommendations for restrictions regarding the COVID-19 Pandemic (e.g. social distancing) in an effort to limit this patient's exposure and mitigate transmission in our community.  Patient was referred to [facility] clinic to discuss lipid therapy.  Total time on phone with patient: 18 minutes  [name] 68 y.o. has significant for hypertension, DM2, CAD s/p CABG, and HDL. Intolerance to few statings and unableto afford Livalo.   Current medications for hyperlipidemia: Ezetimibe 10mg  daily  Medications tried/failed: Rosuvastatin 10mg  daily Simvastatin 20mg  daily simavastatin 40mg  daily    Labs:   Date Total Cholesterol Triglycerides HDL LDL  12-22-2018 199 198 49 110  04-06-2018 153 81 49 88     Assessment/Plan:  Patient unable to afford Livalo and not willing to use injectable mediation. He is willing to start trial with rosuvastatin 10mg  and continue ezetimibe 10mg  daily. Will start rosuvastatin 10mg  3x/week, then increase 1 table every week to a goal of rosuvastatin 10mg  daily. [name] and wife agree with plan and encouraged to call back if unable tot tolerate rosuvastatin.   [name] PharmD, BCPS, [place] [address] 01/03/2019 11:08 AM

## 2019-01-12 DIAGNOSIS — E119 Type 2 diabetes mellitus without complications: Secondary | ICD-10-CM | POA: Diagnosis not present

## 2019-01-28 ENCOUNTER — Other Ambulatory Visit: Payer: Self-pay | Admitting: Family Medicine

## 2019-01-28 ENCOUNTER — Other Ambulatory Visit: Payer: Self-pay | Admitting: Internal Medicine

## 2019-01-28 ENCOUNTER — Other Ambulatory Visit: Payer: Self-pay | Admitting: Cardiovascular Disease

## 2019-02-08 ENCOUNTER — Encounter: Payer: Self-pay | Admitting: Family Medicine

## 2019-02-08 ENCOUNTER — Other Ambulatory Visit: Payer: Self-pay

## 2019-02-08 ENCOUNTER — Ambulatory Visit (INDEPENDENT_AMBULATORY_CARE_PROVIDER_SITE_OTHER): Payer: Medicare PPO | Admitting: Family Medicine

## 2019-02-08 DIAGNOSIS — M150 Primary generalized (osteo)arthritis: Secondary | ICD-10-CM

## 2019-02-08 DIAGNOSIS — M159 Polyosteoarthritis, unspecified: Secondary | ICD-10-CM

## 2019-02-08 DIAGNOSIS — F1190 Opioid use, unspecified, uncomplicated: Secondary | ICD-10-CM | POA: Diagnosis not present

## 2019-02-08 DIAGNOSIS — M8949 Other hypertrophic osteoarthropathy, multiple sites: Secondary | ICD-10-CM

## 2019-02-08 MED ORDER — HYDROCODONE-ACETAMINOPHEN 10-325 MG PO TABS: 1.0000 | ORAL_TABLET | Freq: Four times a day (QID) | ORAL | 0 refills | Status: DC | PRN

## 2019-02-08 NOTE — Progress Notes (Signed)
   Subjective:    Patient ID: [name], male    DOB: 04/04/1951, 68 y.o.   MRN: [id number]  HPI Virtual Visit via Telephone Note  I connected with the patient on 02/08/19 at 11:30 AM EDT by telephone and verified that I am speaking with the correct person using two identifiers. We attempted to connect virtually but we had technical difficulties with the audio and video.     I discussed the limitations, risks, security and privacy concerns of performing an evaluation and management service by telephone and the availability of in person appointments. I also discussed with the patient that there may be a patient responsible charge related to this service. The patient expressed understanding and agreed to proceed.  Location patient: home Location provider: work or home office Participants present for the call: patient, provider Patient did not have a visit in the prior 7 days to address this/these issue(s).   History of Present Illness: Here for pain management. He is doing well.  Indication for chronic opioid: osteoarthritis Medication and dose: Norco 10-325 # pills per month: 125 Last UDS date: 07-30-17 Opioid Treatment Agreement signed (Y/N): 11-01-17 Opioid Treatment Agreement last reviewed with patient:  02-08-19 Solana reviewed this encounter (include red flags): Yes    Observations/Objective: Patient sounds cheerful and well on the phone. I do not appreciate any SOB. Speech and thought processing are grossly intact. Patient reported vitals:  Assessment and Plan: Pain management, meds were refilled.  Alysia Penna, MD   Follow Up Instructions:     [phone] 5-10 [phone] 11-20 [id number] 21-30 I did not refer this patient for an OV in the next 24 hours for this/these issue(s).  I discussed the assessment and treatment plan with the patient. The patient was provided an opportunity to ask questions and all were answered. The patient agreed with the plan and demonstrated an  understanding of the instructions.   The patient was advised to call back or seek an in-person evaluation if the symptoms worsen or if the condition fails to improve as anticipated.  I provided 12 minutes of non-face-to-face time during this encounter.   Alysia Penna, MD    Review of Systems     Objective:   Physical Exam        Assessment & Plan:

## 2019-02-14 ENCOUNTER — Encounter: Payer: Self-pay | Admitting: Family Medicine

## 2019-02-14 ENCOUNTER — Ambulatory Visit (INDEPENDENT_AMBULATORY_CARE_PROVIDER_SITE_OTHER): Payer: Medicare PPO | Admitting: Family Medicine

## 2019-02-14 DIAGNOSIS — M791 Myalgia, unspecified site: Secondary | ICD-10-CM

## 2019-02-14 DIAGNOSIS — R05 Cough: Secondary | ICD-10-CM

## 2019-02-14 DIAGNOSIS — R509 Fever, unspecified: Secondary | ICD-10-CM | POA: Diagnosis not present

## 2019-02-14 DIAGNOSIS — R197 Diarrhea, unspecified: Secondary | ICD-10-CM

## 2019-02-14 DIAGNOSIS — R51 Headache: Secondary | ICD-10-CM | POA: Diagnosis not present

## 2019-02-14 DIAGNOSIS — Z20828 Contact with and (suspected) exposure to other viral communicable diseases: Secondary | ICD-10-CM | POA: Diagnosis not present

## 2019-02-14 DIAGNOSIS — R6889 Other general symptoms and signs: Secondary | ICD-10-CM

## 2019-02-14 NOTE — Progress Notes (Signed)
Subjective:    Patient ID: [name], male    DOB: [birth year], 68 y.o.   MRN: [id number]  HPI Virtual Visit via Video Note  I connected with the patient on 02/14/19 at  8:45 AM EDT by a video enabled telemedicine application and verified that I am speaking with the correct person using two identifiers.  Location patient: home Location provider:work or home office Persons participating in the virtual visit: patient, provider  I discussed the limitations of evaluation and management by telemedicine and the availability of in person appointments. The patient expressed understanding and agreed to proceed.   HPI: Here for 5 days of fever to 100.6 degrees, headache, body aches, a dry cough, and diarrhea. No chest pain or SOB. He has been drinking fluids and taking Tylenol. Today he is feeling better and the fever is gone. He and his wife had spent last week in [place], [place] to participate in a church revival. They got back to [place] 2 days ago. His wife says she feels fine.    ROS: See pertinent positives and negatives per HPI.  Past Medical History:  Diagnosis Date  . CAD (coronary artery disease)    nuclear stress test-09/09/11 low risk scan EF61%, sees Dr. Claiborne Billings   . Chronic headache   . Chronic neck pain   . Diabetes mellitus   . Hyperlipidemia   . Hypertension 12/26/08   ECHO- EF>55%  . Insomnia     Past Surgical History:  Procedure Laterality Date  . APPENDECTOMY    . cervical spine injection    . COLONOSCOPY  04-02-06    per [facility], clear, repeat in 10 yrs  . CORONARY ARTERY BYPASS GRAFT  2002   4 vessels    Family History  Problem Relation Age of Onset  . Liver cancer Father   . Cancer Brother   . Cancer Brother   . Cancer Brother   . Coronary artery disease Other   . Hypertension Other   . Heart disease Mother   . Colon cancer Neg Hx   . Stomach cancer Neg Hx   . Esophageal cancer Neg Hx      Current Outpatient Medications:  .  ACCU-CHEK  AVIVA PLUS test strip, TEST SUGAR ONCE A DAY AND DIAGNOSIS CODE IS E 11.9, Disp: 100 each, Rfl: 1 .  amLODipine (NORVASC) 5 MG tablet, TAKE 1 TABLET BY MOUTH EVERY DAY, Disp: 90 tablet, Rfl: 1 .  aspirin 81 MG tablet, Take 81 mg by mouth daily. , Disp: , Rfl:  .  BYSTOLIC 10 MG tablet, TAKE 1 TABLET BY MOUTH EVERY DAY, Disp: 90 tablet, Rfl: 3 .  Calcium Carbonate-Vitamin D (CALCIUM 600+D PO), Take 1 tablet by mouth daily., Disp: , Rfl:  .  clonazePAM (KLONOPIN) 0.5 MG tablet, Take 1 tablet (0.5 mg total) by mouth 2 (two) times daily as needed for anxiety., Disp: 60 tablet, Rfl: 5 .  Coenzyme Q10 (COQ10) 100 MG CAPS, Take 100 mg by mouth daily. , Disp: , Rfl:  .  dapagliflozin propanediol (FARXIGA) 10 MG TABS tablet, Take 10 mg by mouth daily., Disp: 30 tablet, Rfl: 11 .  ezetimibe (ZETIA) 10 MG tablet, Take 1 tablet (10 mg total) by mouth daily., Disp: 90 tablet, Rfl: 3 .  fexofenadine (ALLEGRA) 180 MG tablet, Take 180 mg by mouth daily as needed for allergies or rhinitis., Disp: , Rfl:  .  gabapentin (NEURONTIN) 300 MG capsule, TAKE 3 CAPSULES BY MOUTH AT BEDTIME, Disp: 270  capsule, Rfl: 0 .  [START ON 04/10/2019] HYDROcodone-acetaminophen (NORCO) 10-325 MG tablet, Take 1 tablet by mouth every 6 (six) hours as needed for moderate pain., Disp: 120 tablet, Rfl: 0 .  Insulin Pen Needle (PEN NEEDLES) 31G X 8 MM MISC, Use daily with victoza insulin, Disp: 50 each, Rfl: 6 .  Lancets MISC, Fast click lancet accu check may substitute for whever pt has, Disp: 100 each, Rfl: 3 .  lisinopril (ZESTRIL) 40 MG tablet, TAKE 1 TABLET BY MOUTH EVERY DAY, Disp: 90 tablet, Rfl: 3 .  meloxicam (MOBIC) 15 MG tablet, TAKE 1 TABLET BY MOUTH EVERY DAY, Disp: 90 tablet, Rfl: 3 .  metFORMIN (GLUCOPHAGE) 1000 MG tablet, Take 1 tablet (1,000 mg total) by mouth 2 (two) times daily., Disp: 180 tablet, Rfl: 3 .  nitroGLYCERIN (NITROSTAT) 0.4 MG SL tablet, Place 1 tablet (0.4 mg total) under the tongue every 5 (five) minutes as  needed for chest pain., Disp: 25 tablet, Rfl: 6 .  Omega-3 350 MG CPDR, Take 350 mg by mouth daily., Disp: , Rfl:  .  pioglitazone (ACTOS) 45 MG tablet, Take 1 tablet (45 mg total) by mouth daily., Disp: 90 tablet, Rfl: 3 .  rosuvastatin (CRESTOR) 10 MG tablet, Take 1 tablet (10 mg total) by mouth daily., Disp: 30 tablet, Rfl: 1 .  sulfamethoxazole-trimethoprim (BACTRIM DS,SEPTRA DS) 800-160 MG tablet, Take 1 tablet by mouth 2 (two) times daily as needed (bladder infections)., Disp: , Rfl:   EXAM:  VITALS per patient if applicable:  GENERAL: alert, oriented, appears well and in no acute distress  HEENT: atraumatic, conjunttiva clear, no obvious abnormalities on inspection of external nose and ears  NECK: normal movements of the head and neck  LUNGS: on inspection no signs of respiratory distress, breathing rate appears normal, no obvious gross SOB, gasping or wheezing  CV: no obvious cyanosis  MS: moves all visible extremities without noticeable abnormality  PSYCH/NEURO: pleasant and cooperative, no obvious depression or anxiety, speech and thought processing grossly intact  ASSESSMENT AND PLAN: He has a viral illness and I believe odss are great that this is from the Covid-19 virus. He will continue to drink fluids and take Tylenol prn. I advised him to go to the drive through testing site today to be tested for the virus, and he agreed. He will quarantine at home until we get results back. I also advised his wife to be tested, but she declined.  Gershon CraneStephen Neva Ramaswamy, MD  Discussed the following assessment and plan:  No diagnosis found.     I discussed the assessment and treatment plan with the patient. The patient was provided an opportunity to ask questions and all were answered. The patient agreed with the plan and demonstrated an understanding of the instructions.   The patient was advised to call back or seek an in-person evaluation if the symptoms worsen or if the condition fails to  improve as anticipated.     Review of Systems     Objective:   Physical Exam        Assessment & Plan:

## 2019-02-17 ENCOUNTER — Ambulatory Visit: Payer: Self-pay | Admitting: Family Medicine

## 2019-02-17 NOTE — Telephone Encounter (Signed)
Wife reports pt. Talked with Dr. Malachy Moan. With flu-like symptoms. States he developed a cough last night - no distress or shortness of breath. Also has been running fevers - 102 this morning. Comes down with Tylenol, but goes back up.Spoke with [facility], no openings in the practice today. Wife reports if cough "gets any worse I'll take him to UC." States he is staying hydrated. Instructed wife to go ahead and take pt. Today.  Answer Assessment - Initial Assessment Questions 1. COVID-19 DIAGNOSIS: "Who made your Coronavirus (COVID-19) diagnosis?" "Was it confirmed by a positive lab test?" If not diagnosed by a HCP, ask "Are there lots of cases (community spread) where you live?" (See public health department website, if unsure)     No test 2. ONSET: "When did the COVID-19 symptoms start?"      Monday 3. WORST SYMPTOM: "What is your worst symptom?" (e.g., cough, fever, shortness of breath, muscle aches)     Fever 4. COUGH: "Do you have a cough?" If so, ask: "How bad is the cough?"       Cough started last night 5. FEVER: "Do you have a fever?" If so, ask: "What is your temperature, how was it measured, and when did it start?"     Yes - 102 this morning 6. RESPIRATORY STATUS: "Describe your breathing?" (e.g., shortness of breath, wheezing, unable to speak)      No 7. BETTER-SAME-WORSE: "Are you getting better, staying the same or getting worse compared to yesterday?"  If getting worse, ask, "In what way?"     Same 8. HIGH RISK DISEASE: "Do you have any chronic medical problems?" (e.g., asthma, heart or lung disease, weak immune system, etc.)     Diabetes, heart issues 9. PREGNANCY: "Is there any chance you are pregnant?" "When was your last menstrual period?"     n/a 10. OTHER SYMPTOMS: "Do you have any other symptoms?"  (e.g., chills, fatigue, headache, loss of smell or taste, muscle pain, sore throat)       Chills, fatigue  Protocols used: CORONAVIRUS (COVID-19) DIAGNOSED OR SUSPECTED-A-AH

## 2019-02-20 ENCOUNTER — Ambulatory Visit (HOSPITAL_COMMUNITY)
Admission: EM | Admit: 2019-02-20 | Discharge: 2019-02-20 | Disposition: A | Discharge status: Home / Self Care | Payer: Medicare PPO | Attending: Emergency Medicine | Admitting: Emergency Medicine

## 2019-02-20 ENCOUNTER — Other Ambulatory Visit: Payer: Self-pay

## 2019-02-20 ENCOUNTER — Encounter (HOSPITAL_COMMUNITY): Payer: Self-pay

## 2019-02-20 ENCOUNTER — Ambulatory Visit (INDEPENDENT_AMBULATORY_CARE_PROVIDER_SITE_OTHER): Payer: Medicare PPO

## 2019-02-20 DIAGNOSIS — M79606 Pain in leg, unspecified: Secondary | ICD-10-CM | POA: Insufficient documentation

## 2019-02-20 DIAGNOSIS — M1990 Unspecified osteoarthritis, unspecified site: Secondary | ICD-10-CM | POA: Insufficient documentation

## 2019-02-20 DIAGNOSIS — M542 Cervicalgia: Secondary | ICD-10-CM | POA: Diagnosis not present

## 2019-02-20 DIAGNOSIS — Z951 Presence of aortocoronary bypass graft: Secondary | ICD-10-CM | POA: Insufficient documentation

## 2019-02-20 DIAGNOSIS — E119 Type 2 diabetes mellitus without complications: Secondary | ICD-10-CM | POA: Insufficient documentation

## 2019-02-20 DIAGNOSIS — Z791 Long term (current) use of non-steroidal anti-inflammatories (NSAID): Secondary | ICD-10-CM | POA: Diagnosis not present

## 2019-02-20 DIAGNOSIS — J189 Pneumonia, unspecified organism: Secondary | ICD-10-CM

## 2019-02-20 DIAGNOSIS — Z79899 Other long term (current) drug therapy: Secondary | ICD-10-CM | POA: Insufficient documentation

## 2019-02-20 DIAGNOSIS — E785 Hyperlipidemia, unspecified: Secondary | ICD-10-CM | POA: Insufficient documentation

## 2019-02-20 DIAGNOSIS — R05 Cough: Secondary | ICD-10-CM | POA: Insufficient documentation

## 2019-02-20 DIAGNOSIS — R531 Weakness: Secondary | ICD-10-CM | POA: Diagnosis not present

## 2019-02-20 DIAGNOSIS — Z87891 Personal history of nicotine dependence: Secondary | ICD-10-CM | POA: Insufficient documentation

## 2019-02-20 DIAGNOSIS — R6883 Chills (without fever): Secondary | ICD-10-CM | POA: Diagnosis not present

## 2019-02-20 DIAGNOSIS — Z794 Long term (current) use of insulin: Secondary | ICD-10-CM | POA: Insufficient documentation

## 2019-02-20 DIAGNOSIS — G4700 Insomnia, unspecified: Secondary | ICD-10-CM | POA: Insufficient documentation

## 2019-02-20 DIAGNOSIS — Z7982 Long term (current) use of aspirin: Secondary | ICD-10-CM | POA: Diagnosis not present

## 2019-02-20 DIAGNOSIS — I10 Essential (primary) hypertension: Secondary | ICD-10-CM | POA: Diagnosis not present

## 2019-02-20 DIAGNOSIS — R509 Fever, unspecified: Secondary | ICD-10-CM | POA: Diagnosis not present

## 2019-02-20 DIAGNOSIS — I2510 Atherosclerotic heart disease of native coronary artery without angina pectoris: Secondary | ICD-10-CM | POA: Diagnosis not present

## 2019-02-20 DIAGNOSIS — U071 COVID-19: Secondary | ICD-10-CM | POA: Insufficient documentation

## 2019-02-20 MED ORDER — AMOXICILLIN-POT CLAVULANATE 875-125 MG PO TABS: 1.0000 | ORAL_TABLET | Freq: Two times a day (BID) | ORAL | 0 refills | Status: AC

## 2019-02-20 NOTE — ED Notes (Signed)
Patient able to ambulate independently  

## 2019-02-20 NOTE — Discharge Instructions (Signed)
Return if any problems.  See your Physician for recheck.  Go to the Emergency department if symptom worsen or change.

## 2019-02-20 NOTE — ED Triage Notes (Signed)
Pt presents with complaints of body aches, chills, fever, weakness and diarrhea x 2 days.

## 2019-02-20 NOTE — ED Provider Notes (Signed)
[facility]    CSN: [id number] Arrival date & time: 02/20/19  1345      History   Chief Complaint Chief Complaint  Patient presents with  . Chills  . Generalized Body Aches  . Weakness    HPI [name] is a 68 y.o. male.   The history is provided by the patient. No language interpreter was used.  Weakness Severity:  Moderate Onset quality:  Gradual Duration:  2 weeks Timing:  Constant Progression:  Worsening Chronicity:  New Relieved by:  Nothing Worsened by:  Nothing Ineffective treatments:  None tried Associated symptoms: cough    Pt exposed to covid.  Pt reports he has been sick for 2 weeks.  Pt complains of getting worse.  Pt has a cough Fever on and off  Past Medical History:  Diagnosis Date  . CAD (coronary artery disease)    nuclear stress test-09/09/11 low risk scan EF61%, sees Dr. Tresa EndoKelly   . Chronic headache   . Chronic neck pain   . Diabetes mellitus   . Hyperlipidemia   . Hypertension 12/26/08   ECHO- EF>55%  . Insomnia     Patient Active Problem List   Diagnosis Date Noted  . Type 2 diabetes mellitus without complication, without long-term current use of insulin (HCC) 08/04/2017  . Osteoarthritis 04/28/2017  . Insomnia 03/03/2017  . Diabetes mellitus (HCC) 11/01/2015  . Fatigue 05/28/2014  . ANXIETY STATE, UNSPECIFIED 06/09/2010  . NECK PAIN 09/03/2009  . LEG PAIN 07/16/2008  . HEADACHE 07/16/2008  . LOSS, HEARING NOS 05/03/2007  . Hyperlipidemia 04/04/2007  . Essential hypertension 04/04/2007  . Coronary atherosclerosis 04/04/2007    Past Surgical History:  Procedure Laterality Date  . APPENDECTOMY    . cervical spine injection    . COLONOSCOPY  04-02-06    per [facility], clear, repeat in 10 yrs  . CORONARY ARTERY BYPASS GRAFT  2002   4 vessels       Home Medications    Prior to Admission medications   Medication Sig Start Date End Date Taking? Authorizing Provider  ACCU-CHEK AVIVA PLUS test strip TEST  SUGAR ONCE A DAY AND DIAGNOSIS CODE IS E 11.9 10/12/18   Nelwyn SalisburyFry, Stephen A, MD  amLODipine (NORVASC) 5 MG tablet TAKE 1 TABLET BY MOUTH EVERY DAY 11/14/18   Lennette BihariKelly, Thomas A, MD  aspirin 81 MG tablet Take 81 mg by mouth daily.     [provider]  BYSTOLIC 10 MG tablet TAKE 1 TABLET BY MOUTH EVERY DAY 07/18/18   Lennette BihariKelly, Thomas A, MD  Calcium Carbonate-Vitamin D (CALCIUM 600+D PO) Take 1 tablet by mouth daily.    [provider]  clonazePAM (KLONOPIN) 0.5 MG tablet Take 1 tablet (0.5 mg total) by mouth 2 (two) times daily as needed for anxiety. 10/18/18   Nelwyn SalisburyFry, Stephen A, MD  Coenzyme Q10 (COQ10) 100 MG CAPS Take 100 mg by mouth daily.     [provider]  dapagliflozin propanediol (FARXIGA) 10 MG TABS tablet Take 10 mg by mouth daily. 10/26/18   Shamleffer, Konrad DoloresIbtehal Jaralla, MD  ezetimibe (ZETIA) 10 MG tablet Take 1 tablet (10 mg total) by mouth daily. 06/14/18 12/20/18  Lennette BihariKelly, Thomas A, MD  fexofenadine (ALLEGRA) 180 MG tablet Take 180 mg by mouth daily as needed for allergies or rhinitis.    [provider]  gabapentin (NEURONTIN) 300 MG capsule TAKE 3 CAPSULES BY MOUTH AT BEDTIME 12/22/18   Nelwyn SalisburyFry, Stephen A, MD  HYDROcodone-acetaminophen [facility](NORCO) [phone]  MG tablet Take 1 tablet by mouth every 6 (six) hours as needed for moderate pain. 04/10/19 05/10/19  Nelwyn SalisburyFry, Stephen A, MD  Insulin Pen Needle (PEN NEEDLES) 31G X 8 MM MISC Use daily with victoza insulin 08/15/18   Nelwyn SalisburyFry, Stephen A, MD  Lancets MISC Fast click lancet accu check may substitute for whever pt has 08/04/17   Nelwyn SalisburyFry, Stephen A, MD  lisinopril (ZESTRIL) 40 MG tablet TAKE 1 TABLET BY MOUTH EVERY DAY 01/30/19   Lennette BihariKelly, Thomas A, MD  meloxicam (MOBIC) 15 MG tablet TAKE 1 TABLET BY MOUTH EVERY DAY 04/18/18   Nelwyn SalisburyFry, Stephen A, MD  metFORMIN (GLUCOPHAGE) 1000 MG tablet Take 1 tablet (1,000 mg total) by mouth 2 (two) times daily. 08/31/18   Shamleffer, Konrad DoloresIbtehal Jaralla, MD  nitroGLYCERIN (NITROSTAT) 0.4 MG SL tablet Place 1 tablet (0.4 mg  total) under the tongue every 5 (five) minutes as needed for chest pain. 05/04/18 12/20/18  Marykay LexHarding, David W, MD  Omega-3 350 MG CPDR Take 350 mg by mouth daily.    [provider]  pioglitazone (ACTOS) 45 MG tablet Take 1 tablet (45 mg total) by mouth daily. 08/31/18   Shamleffer, Konrad DoloresIbtehal Jaralla, MD  rosuvastatin (CRESTOR) 10 MG tablet Take 1 tablet (10 mg total) by mouth daily. 01/03/19   Lennette BihariKelly, Thomas A, MD  sulfamethoxazole-trimethoprim (BACTRIM DS,SEPTRA DS) 800-160 MG tablet Take 1 tablet by mouth 2 (two) times daily as needed (bladder infections).    [provider]    Family History Family History  Problem Relation Age of Onset  . Liver cancer Father   . Cancer Brother   . Cancer Brother   . Cancer Brother   . Coronary artery disease Other   . Hypertension Other   . Heart disease Mother   . Colon cancer Neg Hx   . Stomach cancer Neg Hx   . Esophageal cancer Neg Hx     Social History Social History   Tobacco Use  . Smoking status: Former Smoker    Packs/day: 0.25    Years: 10.00    Pack years: 2.50    Types: Cigarettes    Quit date: 11/26/1970    Years since quitting: 48.2  . Smokeless tobacco: Never Used  Substance Use Topics  . Alcohol use: No    Alcohol/week: 0.0 standard drinks  . Drug use: No     Allergies   Codeine, Crestor [rosuvastatin], Isosorbide nitrate, Lipitor [atorvastatin], Zolpidem tartrate, Ciprofloxacin, and Victoza [liraglutide]   Review of Systems Review of Systems  Respiratory: Positive for cough.   Neurological: Positive for weakness.  All other systems reviewed and are negative.    Physical Exam Triage Vital Signs ED Triage Vitals [02/20/19 1441]  Enc Vitals Group     BP (!) 93/54     Pulse Rate 63     Resp 18     Temp 98 F (36.7 C)     Temp src      SpO2 97 %     Weight      Height      Head Circumference      Peak Flow      Pain Score 0     Pain Loc      Pain Edu?      Excl. in GC?    No data  found.  Updated Vital Signs BP (!) 93/54   Pulse 63   Temp 98 F (36.7 C)   Resp 18   SpO2 97%  Visual Acuity Right Eye Distance:   Left Eye Distance:   Bilateral Distance:    Right Eye Near:   Left Eye Near:    Bilateral Near:     Physical Exam Vitals signs and nursing note reviewed.  Constitutional:      Appearance: He is well-developed.  HENT:     Head: Normocephalic and atraumatic.  Eyes:     Conjunctiva/sclera: Conjunctivae normal.  Neck:     Musculoskeletal: Normal range of motion and neck supple.  Cardiovascular:     Rate and Rhythm: Normal rate and regular rhythm.     Heart sounds: No murmur.  Pulmonary:     Effort: Pulmonary effort is normal. No respiratory distress.     Breath sounds: Normal breath sounds.  Abdominal:     Palpations: Abdomen is soft.     Tenderness: There is no abdominal tenderness.  Musculoskeletal: Normal range of motion.  Skin:    General: Skin is warm and dry.  Neurological:     General: No focal deficit present.     Mental Status: He is alert.  Psychiatric:        Mood and Affect: Mood normal.      UC Treatments / Results  Labs (all labs ordered are listed, but only abnormal results are displayed) Labs Reviewed  NOVEL CORONAVIRUS, NAA (HOSPITAL ORDER, SEND-OUT TO REF LAB)    EKG   Radiology No results found.  Procedures Procedures (including critical care time)  Medications Ordered in UC Medications - No data to display   Initial Impression / Assessment and Plan / UC Course  I have reviewed the triage vital signs and the nursing notes.  Pertinent labs & imaging results that were available during my care of the patient were reviewed by me and considered in my medical decision making (see chart for details).     MDM:  Covid test ordered.   Chest xray  Final Clinical Impressions(s) / UC Diagnoses   Final diagnoses:  Pneumonia of both lungs due to infectious organism, unspecified part of lung   Discharge  Instructions   None    ED Prescriptions    Medication Sig Dispense Auth. Provider   amoxicillin-clavulanate (AUGMENTIN) 875-125 MG tablet Take 1 tablet by mouth 2 (two) times daily for 10 days. 20 tablet Fransico Meadow, [place]     Controlled Substance Prescriptions [place] Controlled Substance Registry consulted? Not Applicable  An After Visit Summary was printed and given to the patient.    Fransico Meadow, [place] 02/20/19 1616

## 2019-02-23 ENCOUNTER — Telehealth (HOSPITAL_COMMUNITY): Payer: Self-pay | Admitting: Emergency Medicine

## 2019-02-23 LAB — NOVEL CORONAVIRUS, NAA (HOSP ORDER, SEND-OUT TO REF LAB; TAT 18-24 HRS): SARS-CoV-2, NAA: DETECTED — AB

## 2019-02-23 NOTE — Telephone Encounter (Signed)
Your test for COVID-19 was positive, meaning that you were infected with the novel coronavirus and could give the germ to others.  Please continue isolation at home, for at least 10 days since the start of your fever/cough/breathlessness and until you have had 3 consecutive days without fever (without taking a fever reducer) and with cough/breathlessness improving. Please continue good preventive care measures, including:  frequent hand-washing, avoid touching your face, cover coughs/sneezes, stay out of crowds and keep a 6 foot distance from others.  Recheck or go to the nearest hospital ED tent for re-assessment if fever/cough/breathlessness return.  Patient contacted and made aware of    results, all questions answered   

## 2019-03-08 ENCOUNTER — Other Ambulatory Visit: Payer: Self-pay | Admitting: Cardiovascular Disease

## 2019-03-15 ENCOUNTER — Other Ambulatory Visit: Payer: Self-pay | Admitting: Cardiovascular Disease

## 2019-04-09 ENCOUNTER — Other Ambulatory Visit: Payer: Self-pay | Admitting: Family Medicine

## 2019-04-14 ENCOUNTER — Other Ambulatory Visit: Payer: Self-pay | Admitting: Family Medicine

## 2019-04-14 NOTE — Telephone Encounter (Signed)
Routing to PCP for approval.

## 2019-04-17 ENCOUNTER — Ambulatory Visit: Payer: Medicare PPO | Admitting: Family Medicine

## 2019-04-17 ENCOUNTER — Other Ambulatory Visit: Payer: Self-pay

## 2019-04-20 ENCOUNTER — Other Ambulatory Visit: Payer: Self-pay

## 2019-04-20 ENCOUNTER — Encounter: Payer: Self-pay | Admitting: Family Medicine

## 2019-04-20 ENCOUNTER — Ambulatory Visit (INDEPENDENT_AMBULATORY_CARE_PROVIDER_SITE_OTHER): Payer: Medicare PPO | Admitting: Family Medicine

## 2019-04-20 VITALS — BP 120/60 | HR 60 | Temp 97.7°F | Ht 69.0 in | Wt 207.6 lb

## 2019-04-20 DIAGNOSIS — M8949 Other hypertrophic osteoarthropathy, multiple sites: Secondary | ICD-10-CM | POA: Diagnosis not present

## 2019-04-20 DIAGNOSIS — M159 Polyosteoarthritis, unspecified: Secondary | ICD-10-CM

## 2019-04-20 DIAGNOSIS — F1190 Opioid use, unspecified, uncomplicated: Secondary | ICD-10-CM | POA: Diagnosis not present

## 2019-04-20 MED ORDER — HYDROCODONE-ACETAMINOPHEN 10-325 MG PO TABS: 1.0000 | ORAL_TABLET | Freq: Four times a day (QID) | ORAL | 0 refills | Status: DC | PRN

## 2019-04-20 NOTE — Progress Notes (Signed)
   Subjective:    Patient ID: [name], male    DOB: 1951-03-21, 68 y.o.   MRN: [id number]  HPI Here for pain management. He is doing well.  Indication for chronic opioid: osteoarthritis Medication and dose: Norco 10--325 # pills per month: 120 Last UDS date: 07-30-17 Opioid Treatment Agreement signed (Y/N): 11-01-17 Opioid Treatment Agreement last reviewed with patient:  04-20-19 [place] reviewed this encounter (include red flags): Yes    Review of Systems  Constitutional: Negative.   Respiratory: Negative.   Cardiovascular: Negative.   Musculoskeletal: Positive for arthralgias.       Objective:   Physical Exam Constitutional:      Appearance: Normal appearance.  Cardiovascular:     Rate and Rhythm: Normal rate and regular rhythm.     Pulses: Normal pulses.     Heart sounds: Normal heart sounds.  Pulmonary:     Effort: Pulmonary effort is normal.     Breath sounds: Normal breath sounds.  Neurological:     Mental Status: He is alert.           Assessment & Plan:  Pain management, meds were refilled.  Alysia Penna, MD

## 2019-05-13 ENCOUNTER — Other Ambulatory Visit: Payer: Self-pay | Admitting: Cardiovascular Disease

## 2019-05-23 ENCOUNTER — Other Ambulatory Visit: Payer: Self-pay | Admitting: Internal Medicine

## 2019-05-23 ENCOUNTER — Other Ambulatory Visit: Payer: Self-pay | Admitting: Family Medicine

## 2019-08-01 ENCOUNTER — Other Ambulatory Visit: Payer: Self-pay

## 2019-08-01 ENCOUNTER — Encounter: Payer: Self-pay | Admitting: Family Medicine

## 2019-08-01 ENCOUNTER — Ambulatory Visit (INDEPENDENT_AMBULATORY_CARE_PROVIDER_SITE_OTHER): Payer: Medicare PPO | Admitting: Family Medicine

## 2019-08-01 VITALS — BP 140/60 | HR 64 | Temp 97.9°F | Wt 211.4 lb

## 2019-08-01 DIAGNOSIS — F1190 Opioid use, unspecified, uncomplicated: Secondary | ICD-10-CM

## 2019-08-01 DIAGNOSIS — M8949 Other hypertrophic osteoarthropathy, multiple sites: Secondary | ICD-10-CM

## 2019-08-01 DIAGNOSIS — M159 Polyosteoarthritis, unspecified: Secondary | ICD-10-CM

## 2019-08-01 MED ORDER — HYDROCODONE-ACETAMINOPHEN 10-325 MG PO TABS: 1.0000 | ORAL_TABLET | Freq: Four times a day (QID) | ORAL | 0 refills | Status: DC | PRN

## 2019-08-01 NOTE — Progress Notes (Signed)
   Subjective:    Patient ID: [name], male    DOB: [birth year], 69 y.o.   MRN: [id number]  HPI Here for pain management. He is doing well.  Indication for chronic opioid: osteoarthritis  Medication and dose: Norco 10-325 # pills per month: 120 Last UDS date: 07-30-17 Opioid Treatment Agreement signed (Y/N): 11-01-17 Opioid Treatment Agreement last reviewed with patient:  08-01-19 [facility] reviewed this encounter (include red flags): Yes   Review of Systems     Objective:   Physical Exam        Assessment & Plan:  Pain management, meds were refilled.  Gershon Crane, MD

## 2019-08-30 ENCOUNTER — Other Ambulatory Visit: Payer: Self-pay | Admitting: Cardiovascular Disease

## 2019-09-04 ENCOUNTER — Other Ambulatory Visit: Payer: Self-pay | Admitting: Cardiovascular Disease

## 2019-09-25 ENCOUNTER — Encounter: Payer: Self-pay | Admitting: Cardiovascular Disease

## 2019-09-25 ENCOUNTER — Ambulatory Visit (INDEPENDENT_AMBULATORY_CARE_PROVIDER_SITE_OTHER): Payer: Medicare PPO | Admitting: Cardiovascular Disease

## 2019-09-25 ENCOUNTER — Other Ambulatory Visit: Payer: Self-pay

## 2019-09-25 DIAGNOSIS — I10 Essential (primary) hypertension: Secondary | ICD-10-CM | POA: Diagnosis not present

## 2019-09-25 DIAGNOSIS — J60 Coalworker's pneumoconiosis: Secondary | ICD-10-CM | POA: Diagnosis not present

## 2019-09-25 DIAGNOSIS — I5032 Chronic diastolic (congestive) heart failure: Secondary | ICD-10-CM | POA: Diagnosis not present

## 2019-09-25 DIAGNOSIS — E785 Hyperlipidemia, unspecified: Secondary | ICD-10-CM

## 2019-09-25 DIAGNOSIS — I25810 Atherosclerosis of coronary artery bypass graft(s) without angina pectoris: Secondary | ICD-10-CM | POA: Diagnosis not present

## 2019-09-25 DIAGNOSIS — Z951 Presence of aortocoronary bypass graft: Secondary | ICD-10-CM

## 2019-09-25 DIAGNOSIS — E118 Type 2 diabetes mellitus with unspecified complications: Secondary | ICD-10-CM | POA: Diagnosis not present

## 2019-09-25 NOTE — Patient Instructions (Signed)
Medication Instructions:  CONTINUE WITH CURRENT MEDICATIONS. NO CHANGES.  *If you need a refill on your cardiac medications before your next appointment, please call your pharmacy*   Lab Work: FASTING LABS: CMET CBC TSH LIPID  If you have labs (blood work) drawn today and your tests are completely normal, you will receive your results only by: Marland Kitchen MyChart Message (if you have MyChart) OR . A paper copy in the mail If you have any lab test that is abnormal or we need to change your treatment, we will call you to review the results.   Testing/Procedures: IN 5 MONTHS Your physician has requested that you have an echocardiogram. Echocardiography is a painless test that uses sound waves to create images of your heart. It provides your doctor with information about the size and shape of your heart and how well your heart's chambers and valves are working. This procedure takes approximately one hour. There are no restrictions for this procedure.  [address]   Follow-Up: At [facility], you and your health needs are our priority.  As part of our continuing mission to provide you with exceptional heart care, we have created designated Provider Care Teams.  These Care Teams include your primary Cardiologist (physician) and Advanced Practice Providers (APPs -  Physician Assistants and Nurse Practitioners) who all work together to provide you with the care you need, when you need it.  We recommend signing up for the patient portal called "MyChart".  Sign up information is provided on this After Visit Summary.  MyChart is used to connect with patients for Virtual Visits (Telemedicine).  Patients are able to view lab/test results, encounter notes, upcoming appointments, etc.  Non-urgent messages can be sent to your provider as well.   To learn more about what you can do with MyChart, go to [email or web address].    Your next appointment:   6 month(s)  The format for your next  appointment:   In Person  Provider:   Nicki Guadalajara, MD

## 2019-09-25 NOTE — Progress Notes (Signed)
Patient ID: [name], male   DOB: 1951/05/04, 69 y.o.   MRN: [id number]      Primary M.D.: Dr. Alysia Penna  HPI: [name] is a 69 y.o. male presents to the office today for a 15 month  follow up cardiology evaluation.   [name] has CAD and in June 2002 underwent CABG surgery by Dr. Roxan Hockey with a sequential LIMA graft to the second diagonal and LAD, and sequential radial graft to the first and third diagonal branches. An echo Doppler study in 2010 showed normal systolic function with mild MR and trace TR.  A  nuclear perfusion study in March 2013 showed normal perfusion and function without scar or ischemia.  Additional problems include hypertension, type 2 diabetes mellitus, and hyperlipidemia. Remotely, he had worked as a Garment/textile technologist minor and in the past was some question of possible black lung. 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  In November 2015 he complained of a slight change in symptomatology with exertional shortness of breath and some arm weakness.  Prior to bypass surgery, he had never experienced chest pain but demonstrated exertional shortness of breath and arm discomfort.  He admitted to fatigue.  He denied any major episodes of chest pressure.  A 2-D echo Doppler study on 06/14/2014 showed an ejection fraction of 55-60% with mild focal basal hypertrophy of the septum; grade 1 diastolic dysfunction.  There was evidence for mild mitral regurgitation and moderate left atrial dilatation.  A nuclear perfusion study on December 11,2015 was interpreted as low risk.  He developed 1-2 mm of horizontal ST segment depression and had normal scintigraphic myocardial perfusion images.  Ejection fraction was 57%.  There was normal LV function without wall motion abnormality.  He has a history of hyperlipidemia and remotely self increased his simvastatin from 40 mg to 80 mg and developed occasional myalgias.   He has been on Bystolic 10 mg and lisinopril 30 mg for his hypertension.  He is on metformin 1000 mg twice a day as well as DiaBeta 5 mg for his type 2 diabetes mellitus.  When I  saw him in October 2018, he had stopped taking Livalo for approximately one month.  He had been busy doing home projects and denied any recurrent chest pain.  During his last evaluation.  I had a lengthy discussion with him and recommended another attempt at a trial of Livalo.  I also added supplemental coenzyme Q 10.  Over this time period, he was able to reinitiate 2 mg daily and has been able to tolerate this without myalgias.  Subsequent blood work did show improvement in his lipid status on 08/04/2017 with a total cholesterol 149, LDL 82.  Triglycerides 148, and HDL 37.  He has continued to be on Januvia and metformin for his diabetes mellitus.  Hypertension.  He has remained on amlodipine 5 mg, lisinopril 30 mg, and Bystolic 10 mg.   When evaluated in March 2019 he was tolerating Livalo and I recommended further titration to 4 mg daily and attempt to obtain an LDL less than 70.  Since I saw him, he has had issues with poorly controlled diabetes mellitus.  Apparently he was started on farziga but developed some dizziness secondary to this.  He is now on metformin, Januvia, and was started on Actos.  Earlier this year he had a fall off a ladder.   I  saw him in October 2019.  He ultimately will require rotator cuff surgery on his right shoulder.  His surgery was delayed due to initial glucose issues.  Since it is been over 17 years since his CABG revascularization, I recommended that he undergo a preoperative exercise Myoview study for preoperative assessment and clearance.  He underwent a nuclear perfusion study on May 05, 2018.  This was interpreted as low risk with normal perfusion and mildly reduced left ventricular global systolic function with an EF of 48%.  He developed horizontal ST segment depression of 1.5 mm during  stress which returned to baseline at 5 to 9 minutes into the recovery period.  When compared to his prior nuclear perfusion study of 2015 this was not significantly changed and on that study he also had normal perfusion with positive ECG changes.  A subsequent echo Doppler study on May 30, 2018 showed an EF of 55 to 60%.  There was severe LVH.  There was grade 1 diastolic dysfunction with mild MR and mild LA dilation.  He denies any chest pain.  He has had periods of increased cough following prolonged coughs he may feel somewhat atypical chest pain discomfort.  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 Lake Bells.  He had his pulmonary evaluation with Dr. 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  Over the past year, [name] has been stable from a cardiac standpoint.  He has not had any anginal symptomatology now 18-1/2 years following his CABG revascularization.  He developed Covid infection and was very weak and fatigued for 5 days.  In addition his son died in 12-07-2018 after he developed a seizure while driving a motorcycle and was around 70 years old.  In addition, his sister died at age 33 secondary to cancer.  He continues to be on amlodipine 5 mg, isosorbide 30 mg, lisinopril 40 mg and Bystolic 10 mg for hypertension.  He is on Zetia and rosuvastatin 10 mg for hyperlipidemia in addition to omega-3 fatty acids.  He is diabetic on Metformin, Farxiga, and Actos.  He presents for evaluation.  Past Medical History:  Diagnosis Date  .  CAD (coronary artery disease)    nuclear stress test-09/09/11 low risk scan EF61%, sees Dr. Claiborne Billings   . Chronic headache   . Chronic neck pain   . Diabetes mellitus   . Hyperlipidemia   . Hypertension 12/26/08   ECHO- EF>55%  . Insomnia     Past Surgical History:  Procedure Laterality Date  . APPENDECTOMY    . cervical spine injection    . COLONOSCOPY  04-02-06    per [facility], clear, repeat in 10 yrs  . CORONARY ARTERY BYPASS GRAFT  2002   4 vessels    Allergies  Allergen Reactions  . Codeine Anaphylaxis    Pt. verbalized that he has taken and can take Hydrocodone, Oxycodone w/o issue - codeine allergy only  . Crestor [Rosuvastatin]     Myalgia   . Isosorbide Nitrate Other (See Comments)    Headaches  . Lipitor [Atorvastatin]  myalgia  . Zolpidem Tartrate     REACTION: HA's  . Ciprofloxacin Hives and Rash  . Victoza [Liraglutide] Rash    Current Outpatient Medications  Medication Sig Dispense Refill  . ACCU-CHEK AVIVA PLUS test strip TEST SUGAR ONCE A DAY AND DIAGNOSIS CODE IS E 11.9 100 each 1  . amLODipine (NORVASC) 5 MG tablet TAKE 1 TABLET BY MOUTH EVERY DAY 90 tablet 1  . aspirin 81 MG tablet Take 81 mg by mouth daily.     Marland Kitchen BYSTOLIC 10 MG tablet TAKE 1 TABLET BY MOUTH EVERY DAY 90 tablet 3  . Calcium Carbonate-Vitamin D (CALCIUM 600+D PO) Take 1 tablet by mouth daily.    . clonazePAM (KLONOPIN) 0.5 MG tablet TAKE 1 TABLET BY MOUTH TWICE A DAY 60 tablet 5  . Coenzyme Q10 (COQ10) 100 MG CAPS Take 100 mg by mouth daily.     . dapagliflozin propanediol (FARXIGA) 10 MG TABS tablet Take 10 mg by mouth daily. 30 tablet 11  . ezetimibe (ZETIA) 10 MG tablet TAKE 1 TABLET BY MOUTH EVERY DAY 90 tablet 0  . fexofenadine (ALLEGRA) 180 MG tablet Take 180 mg by mouth daily as needed for allergies or rhinitis.    Derrill Memo ON 10/07/2019] HYDROcodone-acetaminophen (NORCO) 10-325 MG tablet Take 1 tablet by mouth every 6 (six) hours as needed for moderate pain. 120 tablet 0  .  Insulin Pen Needle (PEN NEEDLES) 31G X 8 MM MISC Use daily with victoza insulin 50 each 6  . isosorbide mononitrate (IMDUR) 30 MG 24 hr tablet     . Lancets MISC Fast click lancet accu check may substitute for whever pt has 100 each 3  . lisinopril (ZESTRIL) 40 MG tablet TAKE 1 TABLET BY MOUTH EVERY DAY 90 tablet 3  . meloxicam (MOBIC) 15 MG tablet TAKE 1 TABLET BY MOUTH EVERY DAY 90 tablet 3  . metFORMIN (GLUCOPHAGE) 1000 MG tablet Take 1 tablet (1,000 mg total) by mouth 2 (two) times daily. 180 tablet 3  . Omega-3 350 MG CPDR Take 350 mg by mouth daily.    . pioglitazone (ACTOS) 45 MG tablet Take 1 tablet (45 mg total) by mouth daily. 90 tablet 3  . rosuvastatin (CRESTOR) 10 MG tablet TAKE 1 TABLET BY MOUTH EVERY DAY 90 tablet 0  . sulfamethoxazole-trimethoprim (BACTRIM DS,SEPTRA DS) 800-160 MG tablet Take 1 tablet by mouth 2 (two) times daily as needed (bladder infections).    . gabapentin (NEURONTIN) 300 MG capsule TAKE 3 CAPSULES BY MOUTH AT BEDTIME 270 capsule 0  . nitroGLYCERIN (NITROSTAT) 0.4 MG SL tablet Place 1 tablet (0.4 mg total) under the tongue every 5 (five) minutes as needed for chest pain. 25 tablet 6   No current facility-administered medications for this visit.    Social History   Socioeconomic History  . Marital status: Married    Spouse name: Not on file  . Number of children: Not on file  . Years of education: Not on file  . Highest education level: Not on file  Occupational History  . Not on file  Tobacco Use  . Smoking status: Former Smoker    Packs/day: 0.25    Years: 10.00    Pack years: 2.50    Types: Cigarettes    Quit date: 11/26/1970    Years since quitting: 48.8  . Smokeless tobacco: Never Used  Substance and Sexual Activity  . Alcohol use: No    Alcohol/week: 0.0 standard drinks  . Drug use: No  .  Sexual activity: Not on file  Other Topics Concern  . Not on file  Social History Narrative  . Not on file   Social Determinants of Health    Financial Resource Strain:   . Difficulty of Paying Living Expenses:   Food Insecurity:   . Worried About Charity fundraiser in the Last Year:   . Arboriculturist in the Last Year:   Transportation Needs:   . Film/video editor (Medical):   Marland Kitchen Lack of Transportation (Non-Medical):   Physical Activity:   . Days of Exercise per Week:   . Minutes of Exercise per Session:   Stress:   . Feeling of Stress :   Social Connections:   . Frequency of Communication with Friends and Family:   . Frequency of Social Gatherings with Friends and Family:   . Attends Religious Services:   . Active Member of Clubs or Organizations:   . Attends Archivist Meetings:   Marland Kitchen Marital Status:   Intimate Partner Violence:   . Fear of Current or Ex-Partner:   . Emotionally Abused:   Marland Kitchen Physically Abused:   . Sexually Abused:     Family History  Problem Relation Age of Onset  . Liver cancer Father   . Cancer Brother   . Cancer Brother   . Cancer Brother   . Coronary artery disease Other   . Hypertension Other   . Heart disease Mother   . Colon cancer Neg Hx   . Stomach cancer Neg Hx   . Esophageal cancer Neg Hx    Socially he is married. He has 3 children 8 grandchildren and 4 great-grandchildren. There is no tobacco or alcohol use. He is now retired.   ROS General: Negative; No fevers, chills, or night sweats;  HEENT: Decrease in hearing ,No changes in vision  sinus congestion, difficulty swallowing Pulmonary: History of "black lung" Cardiovascular: See history of present illness GI: Negative; No nausea, vomiting, diarrhea, or abdominal pain GU: Negative; No dysuria, hematuria, or difficulty voiding Musculoskeletal: Negative; no myalgias, joint pain, or weakness Hematologic/Oncology: Negative; no easy bruising, bleeding Endocrine: Positive for diabetes mellitus Neuro: Positive for paresthesias in his legs. Skin: Negative; No rashes or skin lesions Psychiatric: Negative; No  behavioral problems, depression Sleep: Positive for restless legs; No snoring, daytime sleepiness, hypersomnolence, bruxism, hypnogognic hallucinations, no cataplexy Other comprehensive 14 point system review is negative.   PE BP (!) 157/73   Pulse (!) 58   Temp (!) 96.8 F (36 C)   Resp 17   Ht 5' 9" (1.753 m)   Wt 211 lb 3.2 oz (95.8 kg)   SpO2 98%   BMI 31.19 kg/m   Repeat blood pressure by me 132/74  Wt Readings from Last 3 Encounters:  09/25/19 211 lb 3.2 oz (95.8 kg)  08/01/19 211 lb 6.4 oz (95.9 kg)  04/20/19 207 lb 9.6 oz (94.2 kg)    General: Alert, oriented, no distress.  Skin: normal turgor, no rashes, warm and dry HEENT: Normocephalic, atraumatic. Pupils equal round and reactive to light; sclera anicteric; extraocular muscles intact;  Nose without nasal septal hypertrophy Mouth/Parynx benign; Mallinpatti scale 3 Neck: No JVD, no carotid bruits; normal carotid upstroke Lungs: clear to ausculatation and percussion; no wheezing or rales Chest wall: without tenderness to palpitation Heart: PMI not displaced, RRR, s1 s2 normal, 1/6 systolic murmur, no diastolic murmur, no rubs, gallops, thrills, or heaves Abdomen: soft, nontender; no hepatosplenomehaly, BS+; abdominal aorta nontender and not  dilated by palpation. Back: no CVA tenderness Pulses 2+ Musculoskeletal: full range of motion, normal strength, no joint deformities Extremities: no clubbing cyanosis or edema, Homan's sign negative  Neurologic: grossly nonfocal; Cranial nerves grossly wnl Psychologic: Normal mood and affect   ECG (independently read by me): Sinus bradycardia 59 bpm.  Nonspecific ST abnormality.  Normal intervals.  No ectopy.  December 2019 ECG (independently read by me): Normal sinus rhythm at 61 bpm.  Early transition.  No ectopy.  No ST segment changes.  October 2019 ECG (independently read by me): Sinus bradycardia 53 bpm.  Early transition.  No ectopy.  March 2019 ECG (independently  read by me): Sinus bradycardia 59 bpm.  Early transition.  Normal intervals.  October 2018 ECG (independently read by me): Normal sinus rhythm at 60 bpm.  Nonspecific T wave abnormality.  Normal intervals.  No ectopy.  May 2018 ECG (independently read by me): Sinus bradycardia 59 bpm.  No significant ST-T changes.  Early transition.  April 2017 ECG (independently read by me): Sinus bradycardia at 53 bpm.  Q waves in III and F.  March 2016 ECG (independently read by me): Normal sinus rhythm at 67.  No ectopy.  November 2015 ECG (independently read by me): Normal sinus rhythm at 65 bpm.  Early transition.  No significant ST segment changes.  Normal intervals.  Prior ECG: Normal sinus rhythm at 58 beats per minute. QTc interval 428 ms. PR interval 170 ms. Nonspecific ST changes   LABS:  BMP Latest Ref Rng & Units 12/22/2018 04/05/2018 02/18/2018  Glucose 65 - 99 mg/dL 139(H) 130(H) 267(H)  BUN 8 - 27 mg/dL _0 Creatinine 0.76 - 1.27 mg/dL 0.90 0.93 0.88  BUN/Creat Ratio 10 - _1 -  Sodium 134 - 144 mmol/L 139 143 137  Potassium 3.5 - 5.2 mmol/L 4.8 3.8 4.0  Chloride 96 - 106 mmol/L 101 100 102  CO2 20 - 29 mmol/L _2 Calcium 8.6 - 10.2 mg/dL 10.1 9.3 9.2    Hepatic Function Latest Ref Rng & Units 12/22/2018 04/05/2018 11/22/2017  Total Protein 6.0 - 8.5 g/dL 7.0 6.9 7.0  Albumin 3.8 - 4.8 g/dL 4.6 4.5 -  AST 0 - 40 IU/L _3 ALT 0 - 44 IU/L _4 Alk Phosphatase 39 - 117 IU/L 84 73 -  Total Bilirubin 0.0 - 1.2 mg/dL 0.3 0.3 0.4  Bilirubin, Direct 0.0 - 0.3 mg/dL - - -    CBC Latest Ref Rng & Units 02/18/2018 08/04/2017 11/05/2016  WBC 4.0 - 10.5 K/uL 6.8 7.4 7.9  Hemoglobin 13.0 - 17.0 g/dL 13.9 14.5 14.5  Hematocrit 39.0 - 52.0 % 41.5 42.9 43.1  Platelets 150 - 400 K/uL 166 191.0 202   Lab Results  Component Value Date   MCV 89.1 02/18/2018   MCV 88.6 08/04/2017   MCV 90.9 11/05/2016    Lab Results  Component Value Date   TSH 2.78 08/04/2017   Lab  Results  Component Value Date   HGBA1C 8.5 (A) 08/09/2018    BNP No results found for: PROBNP  Lipid Panel     Component Value Date/Time   CHOL 199 12/22/2018 0927   CHOL 173 05/24/2013 1049   TRIG 198 (H) 12/22/2018 0927   TRIG 185 (H) 05/24/2013 1049   HDL 49 12/22/2018 0927   HDL 39 (L) 05/24/2013 1049   CHOLHDL 4.1 12/22/2018 0927   CHOLHDL 3.4 11/22/2017 0846   VLDL  29.6 08/04/2017 1101   LDLCALC 110 (H) 12/22/2018 0927   LDLCALC 84 11/22/2017 0846   LDLCALC 97 05/24/2013 1049     RADIOLOGY: No results found.  IMPRESSION:  1. Essential hypertension   2. Coronary artery disease involving coronary bypass graft of native heart without angina pectoris   3. Hx of CABG   4. Hyperlipidemia LDL goal <70   5. Chronic diastolic heart failure ([place])   6. Type 2 diabetes mellitus with complication, without long-term current use of insulin ([facility])   7. Black lung [facility])    ASSESSMENT AND PLAN: [name] is a 17 -year-old Caucasian male who underwent CABG revascularization surgery by Dr. Roxan Hockey in November 2002.  Remotely he had noticed mild shortness of breath and fatigue and never experienced classic substernal chest pressure.  A nuclear perfusion study in 2015 continued to show normal perfusion but he developed ST segment changes of 1-2 mm.  Amlodipine was added to his medical regimen and since institution he has felt improved.   A 4-year follow-up nuclear stress test in anticipation of potential future shoulder surgery  was unchanged from previously and continued to show normal perfusion but again he developed asymptomatic ST depression which lasted 5 to 9 minutes into the recovery period for resolution.  His blood pressure today is relatively controlled and a repeat by me was 132/74 on his regimen consisting of amlodipine 5 mg, Bystolic 10 mg, lisinopril 40 mg in addition to isosorbide 30 mg.  He is not having any anginal symptomatology.  He has had issues with statin  intolerance and had tolerated Livalo but due to insurance issues ultimately was switched to low-dose rosuvastatin which on rechallenge she has been able to tolerate.  He has not had any recent laboratory I am recommending complete set of fasting lab work including a comprehensive metabolic panel, CBC, TSH and lipid studies.  He is diabetic on Jardiance, Metformin and Actos.  I am recommending a follow-up echo Doppler study in 6 months to reassess systolic and diastolic function as well as valvular architecture.  He continues to be on gabapentin for his peripheral neuropathy I will see him in follow-up of his echo and further recommendations were made at that time.    Troy Sine, MD, [facility]  09/27/2019 1:40 PM

## 2019-09-27 ENCOUNTER — Other Ambulatory Visit: Payer: Self-pay | Admitting: Family Medicine

## 2019-09-27 ENCOUNTER — Encounter: Payer: Self-pay | Admitting: Cardiovascular Disease

## 2019-10-15 ENCOUNTER — Other Ambulatory Visit: Payer: Self-pay | Admitting: Family Medicine

## 2019-10-16 NOTE — Telephone Encounter (Signed)
Last filled 04/14/2019 Last OV 09/25/2019  Ok to fill?

## 2019-10-21 ENCOUNTER — Other Ambulatory Visit: Payer: Self-pay | Admitting: Internal Medicine

## 2019-10-23 ENCOUNTER — Other Ambulatory Visit: Payer: Self-pay

## 2019-10-23 ENCOUNTER — Telehealth: Payer: Self-pay

## 2019-10-23 MED ORDER — PIOGLITAZONE HCL 45 MG PO TABS: 45.0000 mg | ORAL_TABLET | Freq: Every day | ORAL | 0 refills | Status: DC

## 2019-10-23 NOTE — Telephone Encounter (Signed)
TC from patient wife to schedule a follow up visit due to medication refill denial of Actos.  Scheduled appointment for this week and they wondered if they can get a refill now or wait until the appointment? He has been out a few days.

## 2019-10-23 NOTE — Telephone Encounter (Signed)
Dr. Lonzo Cloud ok;d a 30 day supply because f/u has been scheduled.

## 2019-10-25 ENCOUNTER — Encounter: Payer: Self-pay | Admitting: Internal Medicine

## 2019-10-25 ENCOUNTER — Ambulatory Visit (INDEPENDENT_AMBULATORY_CARE_PROVIDER_SITE_OTHER): Payer: Medicare PPO | Admitting: Internal Medicine

## 2019-10-25 ENCOUNTER — Other Ambulatory Visit: Payer: Self-pay

## 2019-10-25 VITALS — BP 148/68 | HR 65 | Temp 98.1°F | Ht 69.0 in | Wt 213.4 lb

## 2019-10-25 DIAGNOSIS — I10 Essential (primary) hypertension: Secondary | ICD-10-CM | POA: Diagnosis not present

## 2019-10-25 DIAGNOSIS — I2510 Atherosclerotic heart disease of native coronary artery without angina pectoris: Secondary | ICD-10-CM | POA: Diagnosis not present

## 2019-10-25 DIAGNOSIS — E1159 Type 2 diabetes mellitus with other circulatory complications: Secondary | ICD-10-CM | POA: Diagnosis not present

## 2019-10-25 DIAGNOSIS — E1165 Type 2 diabetes mellitus with hyperglycemia: Secondary | ICD-10-CM

## 2019-10-25 DIAGNOSIS — Z951 Presence of aortocoronary bypass graft: Secondary | ICD-10-CM | POA: Diagnosis not present

## 2019-10-25 DIAGNOSIS — E785 Hyperlipidemia, unspecified: Secondary | ICD-10-CM | POA: Diagnosis not present

## 2019-10-25 DIAGNOSIS — I5032 Chronic diastolic (congestive) heart failure: Secondary | ICD-10-CM | POA: Diagnosis not present

## 2019-10-25 DIAGNOSIS — E1142 Type 2 diabetes mellitus with diabetic polyneuropathy: Secondary | ICD-10-CM | POA: Diagnosis not present

## 2019-10-25 DIAGNOSIS — I25810 Atherosclerosis of coronary artery bypass graft(s) without angina pectoris: Secondary | ICD-10-CM | POA: Diagnosis not present

## 2019-10-25 LAB — POCT GLYCOSYLATED HEMOGLOBIN (HGB A1C): Hemoglobin A1C: 7.5 % — AB (ref 4.0–5.6)

## 2019-10-25 LAB — GLUCOSE, POCT (MANUAL RESULT ENTRY): POC Glucose: 193 mg/dl — AB (ref 70–99)

## 2019-10-25 MED ORDER — PIOGLITAZONE HCL 45 MG PO TABS: 45.0000 mg | ORAL_TABLET | Freq: Every day | ORAL | 3 refills | Status: DC

## 2019-10-25 MED ORDER — METFORMIN HCL 1000 MG PO TABS: 1000.0000 mg | ORAL_TABLET | Freq: Two times a day (BID) | ORAL | 3 refills | Status: DC

## 2019-10-25 MED ORDER — FARXIGA 10 MG PO TABS: 10.0000 mg | ORAL_TABLET | Freq: Every day | ORAL | 11 refills | Status: DC

## 2019-10-25 NOTE — Patient Instructions (Addendum)
-   Continue Metformin 1000 mg Twice a day  - Continue Actos 45 mg daily  - Continue Farxiga 10 mg once a day      HOW TO TREAT LOW BLOOD SUGARS (Blood sugar LESS THAN 70 MG/DL)  Please follow the RULE OF 15 for the treatment of hypoglycemia treatment (when your (blood sugars are less than 70 mg/dL)    STEP 1: Take 15 grams of carbohydrates when your blood sugar is low, which includes:   3-4 GLUCOSE TABS  OR  3-4 OZ OF JUICE OR REGULAR SODA OR  ONE TUBE OF GLUCOSE GEL     STEP 2: RECHECK blood sugar in 15 MINUTES STEP 3: If your blood sugar is still low at the 15 minute recheck --> then, go back to STEP 1 and treat AGAIN with another 15 grams of carbohydrates.

## 2019-10-25 NOTE — Progress Notes (Signed)
Name: [name]   Age/ Sex: 69 y.o. male   MRN/ DOB: [id number]/ 1950-11-03     PCP: Laurey Morale, MD   Reason for Endocrinology Evaluation: Type 2 Diabetes Mellitus  Initial Endocrine Consultative Visit: 08/31/2018    PATIENT IDENTIFIER: [name] is a 69 y.o. male with a past medical history of T2DM,CAD (S/P CABG). The patient has followed with Endocrinology clinic since 08/31/2018 for consultative assistance with management of his diabetes.  DIABETIC HISTORY:  [name] was diagnosed with T2DM in 2010, he has been on Metformin and Actos for many years, he is intolerant to Victoza due to rash. Januvia has been ineffective , which he stopped 08/2018. He has never been on insulin . His hemoglobin A1c has ranged from 6.1% in 2017, peaking at 10.4% in 2019  On his initial presentation his A1c was 8.5% . He was on Metformin and Actos   SUBJECTIVE:   During the last visit (11/30/2018): A1c 8.5%. Continued on Farxiga, Metformin and Actos  Today (10/26/2019): [name] is here with his wife for a  follow up visit on his diabetes.He has not been here in almost a year.   He checks his blood sugars 0 times daily. The patient has not had hypoglycemic episodes since the last clinic visit. Otherwise, the patient has not required any recent emergency interventions for hypoglycemia and has not had recent hospitalizations secondary to hyper or hypoglycemic episodes.     ROS: As per HPI and as detailed below: Review of Systems  Respiratory: Positive for shortness of breath. Negative for cough.   Cardiovascular: Negative for leg swelling.  Gastrointestinal: Negative for diarrhea and nausea.      HOME DIABETES REGIMEN:  Metformin 1000 mg BID  Actos 45 mg daily  Farxiga 10 mg daily    GLUCOSE LOG : Did not bring    HISTORY:  Past Medical History:  Past Medical History:  Diagnosis Date  . CAD (coronary artery disease)    nuclear stress test-09/09/11 low  risk scan EF61%, sees Dr. Claiborne Billings   . Chronic headache   . Chronic neck pain   . Diabetes mellitus   . Hyperlipidemia   . Hypertension 12/26/08   ECHO- EF>55%  . Insomnia    Past Surgical History:  Past Surgical History:  Procedure Laterality Date  . APPENDECTOMY    . cervical spine injection    . COLONOSCOPY  04-02-06    per [facility], clear, repeat in 10 yrs  . CORONARY ARTERY BYPASS GRAFT  2002   4 vessels    Social History:  reports that he quit smoking about 48 years ago. His smoking use included cigarettes. He has a 2.50 pack-year smoking history. He has never used smokeless tobacco. He reports that he does not drink alcohol or use drugs. Family History:  Family History  Problem Relation Age of Onset  . Liver cancer Father   . Cancer Brother   . Cancer Brother   . Cancer Brother   . Coronary artery disease Other   . Hypertension Other   . Heart disease Mother   . Colon cancer Neg Hx   . Stomach cancer Neg Hx   . Esophageal cancer Neg Hx      HOME MEDICATIONS: Allergies as of 10/25/2019      Reactions   Codeine Anaphylaxis   Pt. verbalized that he has taken and can take Hydrocodone, Oxycodone w/o issue - codeine allergy only  Crestor [rosuvastatin]    Myalgia    Isosorbide Nitrate Other (See Comments)   Headaches   Lipitor [atorvastatin]    myalgia   Zolpidem Tartrate    REACTION: HA's   Ciprofloxacin Hives, Rash   Victoza [liraglutide] Rash      Medication List       Accurate as of October 25, 2019 11:59 PM. If you have any questions, ask your nurse or doctor.        Accu-Chek Aviva Plus test strip Generic drug: glucose blood TEST SUGAR ONCE A DAY AND DIAGNOSIS CODE IS E 11.9   amLODipine 5 MG tablet Commonly known as: NORVASC TAKE 1 TABLET BY MOUTH EVERY DAY   aspirin 81 MG tablet Take 81 mg by mouth daily.   Bystolic 10 MG tablet Generic drug: nebivolol TAKE 1 TABLET BY MOUTH EVERY DAY   CALCIUM 600+D PO Take 1 tablet by mouth daily.     clonazePAM 0.5 MG tablet Commonly known as: KLONOPIN TAKE 1 TABLET BY MOUTH TWICE A DAY   CoQ10 100 MG Caps Take 100 mg by mouth daily.   ezetimibe 10 MG tablet Commonly known as: ZETIA TAKE 1 TABLET BY MOUTH EVERY DAY   Farxiga 10 MG Tabs tablet Generic drug: dapagliflozin propanediol Take 10 mg by mouth daily.   fexofenadine 180 MG tablet Commonly known as: ALLEGRA Take 180 mg by mouth daily as needed for allergies or rhinitis.   gabapentin 300 MG capsule Commonly known as: NEURONTIN TAKE 3 CAPSULES BY MOUTH AT BEDTIME   HYDROcodone-acetaminophen 10-325 MG tablet Commonly known as: NORCO Take 1 tablet by mouth every 6 (six) hours as needed for moderate pain.   isosorbide mononitrate 30 MG 24 hr tablet Commonly known as: IMDUR   Lancets Misc Fast click lancet accu check may substitute for whever pt has   lisinopril 40 MG tablet Commonly known as: ZESTRIL TAKE 1 TABLET BY MOUTH EVERY DAY   meloxicam 15 MG tablet Commonly known as: MOBIC TAKE 1 TABLET BY MOUTH EVERY DAY   metFORMIN 1000 MG tablet Commonly known as: GLUCOPHAGE Take 1 tablet (1,000 mg total) by mouth 2 (two) times daily.   nitroGLYCERIN 0.4 MG SL tablet Commonly known as: NITROSTAT Place 1 tablet (0.4 mg total) under the tongue every 5 (five) minutes as needed for chest pain.   Omega-3 350 MG Cpdr Take 350 mg by mouth daily.   Pen Needles 31G X 8 MM Misc Use daily with victoza insulin   pioglitazone 45 MG tablet Commonly known as: ACTOS Take 1 tablet (45 mg total) by mouth daily.   rosuvastatin 10 MG tablet Commonly known as: CRESTOR TAKE 1 TABLET BY MOUTH EVERY DAY   sulfamethoxazole-trimethoprim 800-160 MG tablet Commonly known as: BACTRIM DS Take 1 tablet by mouth 2 (two) times daily as needed (bladder infections).      PHYSICAL EXAM: VS: BP (!) 148/68 (BP Location: Right Arm, Patient Position: Sitting, Cuff Size: Large)   Pulse 65   Temp 98.1 F (36.7 C)   Ht 5\' 9"  (1.753  m)   Wt 213 lb 6.4 oz (96.8 kg)   SpO2 97%   BMI 31.51 kg/m    EXAM: General: Pt appears well and is in NAD  Lungs: Clear with good BS bilat with no rales, rhonchi, or wheezes  Heart: Auscultation: RRR with normal S1 and S2, no gallops or murmurs  Abdomen: Normoactive bowel sounds, soft, nontender, without masses or organomegaly palpable  Extremities: BL LE: no pretibial  edema normal ROM and strength, no joint enlargement or tenderness  Mental Status: Judgment, insight: intact Mood and affect: no depression, anxiety, or agitation   DM Foot Exam 10/25/2019 The skin of the feet is intact without sores or ulcerations. The pedal pulses are 2+ on right and 2+ on left. The sensation is intact to a screening 5.07, 10 gram monofilament bilaterally    DATA REVIEWED:  Lab Results  Component Value Date   HGBA1C 7.5 (A) 10/25/2019   HGBA1C 8.5 (A) 08/09/2018   HGBA1C 8.4 (H) 05/10/2018   Lab Results  Component Value Date   MICROALBUR 0.5 08/21/2008   LDLCALC 71 10/25/2019   CREATININE 0.82 10/25/2019    Lab Results  Component Value Date   CHOL 138 10/25/2019   HDL 49 10/25/2019   LDLCALC 71 10/25/2019   LDLDIRECT 128.0 10/29/2011   TRIG 98 10/25/2019   CHOLHDL 2.8 10/25/2019       Results for Cartlidge, Thomas H "JESS" (MRN [id number]) as of 10/26/2019 12:38  Ref. Range 10/25/2019 17:11  Sodium Latest Ref Range: 134 - 144 mmol/L 144  Potassium Latest Ref Range: 3.5 - 5.2 mmol/L 4.8  Chloride Latest Ref Range: 96 - 106 mmol/L 105  CO2 Latest Ref Range: 20 - 29 mmol/L 25  Glucose Latest Ref Range: 65 - 99 mg/dL 979 (H)  BUN Latest Ref Range: 8 - 27 mg/dL 13  Creatinine Latest Ref Range: 0.76 - 1.27 mg/dL 8.92  Calcium Latest Ref Range: 8.6 - 10.2 mg/dL 9.6  BUN/Creatinine Ratio Latest Ref Range: 10 - 24  16  Alkaline Phosphatase Latest Ref Range: 39 - 117 IU/L 72  Albumin Latest Ref Range: 3.8 - 4.8 g/dL 4.2  Albumin/Globulin Ratio Latest Ref Range: 1.2 - 2.2  1.6  AST Latest  Ref Range: 0 - 40 IU/L 38  ALT Latest Ref Range: 0 - 44 IU/L 30  Total Protein Latest Ref Range: 6.0 - 8.5 g/dL 6.8  Total Bilirubin Latest Ref Range: 0.0 - 1.2 mg/dL 0.3  GFR, Est Non African American Latest Ref Range: >59 mL/min/1.73 90    ASSESSMENT / PLAN / RECOMMENDATIONS:   1) Type 2 Diabetes Mellitus, Acceptable control, With neuropathic and macrovascular complications - Most recent A1c of 7.5 %. Goal A1c < 7.5 %.     -Acceptable control at this time. -No changes will be made today, but if his A1c continues to trend upwards we will consider either adding a sulfonylurea or a GLP-1 agonist. - BMP normal   MEDICATIONS:  Farxiga 10 mg daily  Continue Metformin 1000 mg BID  Continue Actos 45 mg daily   EDUCATION / INSTRUCTIONS:  BG monitoring instructions: Patient is instructed to check his blood sugars 2 times a day, fasting and bedtime.  Call [facility] if: BG persistently < 70 or > 300. . I reviewed the Rule of 15 for the treatment of hypoglycemia in detail with the patient. Literature supplied.      F/U in 6 months    Signed electronically by: Lyndle Herrlich, MD  [facility]  [facility] [address] [place] [place], [place] [address] Phone: [phone] FAX: [phone]   CC: Nelwyn Salisbury, MD [address] [place] [address] Phone: [phone]  Fax: [phone]  Return to Endocrinology clinic as below: Future Appointments  Date Time Provider Department Center  02/05/2020  8:15 AM [id number]  04/25/2020  7:30 AM Moriah Loughry, Konrad Dolores, MD [id number]  None

## 2019-10-26 LAB — CBC
Hematocrit: 42.6 % (ref 37.5–51.0)
Hemoglobin: 13.8 g/dL (ref 13.0–17.7)
MCH: 28.7 pg (ref 26.6–33.0)
MCHC: 32.4 g/dL (ref 31.5–35.7)
MCV: 89 fL (ref 79–97)
Platelets: 200 10*3/uL (ref 150–450)
RBC: 4.81 x10E6/uL (ref 4.14–5.80)
RDW: 13.8 % (ref 11.6–15.4)
WBC: 6.1 10*3/uL (ref 3.4–10.8)

## 2019-10-26 LAB — COMPREHENSIVE METABOLIC PANEL
ALT: 30 IU/L (ref 0–44)
AST: 38 IU/L (ref 0–40)
Albumin/Globulin Ratio: 1.6 (ref 1.2–2.2)
Albumin: 4.2 g/dL (ref 3.8–4.8)
Alkaline Phosphatase: 72 IU/L (ref 39–117)
BUN/Creatinine Ratio: 16 (ref 10–24)
BUN: 13 mg/dL (ref 8–27)
Bilirubin Total: 0.3 mg/dL (ref 0.0–1.2)
CO2: 25 mmol/L (ref 20–29)
Calcium: 9.6 mg/dL (ref 8.6–10.2)
Chloride: 105 mmol/L (ref 96–106)
Creatinine, Ser: 0.82 mg/dL (ref 0.76–1.27)
GFR calc Af Amer: 104 mL/min/{1.73_m2} (ref >59)
GFR calc non Af Amer: 90 mL/min/{1.73_m2} (ref >59)
Globulin, Total: 2.6 g/dL (ref 1.5–4.5)
Glucose: 120 mg/dL — ABNORMAL HIGH (ref 65–99)
Potassium: 4.8 mmol/L (ref 3.5–5.2)
Sodium: 144 mmol/L (ref 134–144)
Total Protein: 6.8 g/dL (ref 6.0–8.5)

## 2019-10-26 LAB — TSH: TSH: 3.8 u[IU]/mL (ref 0.450–4.500)

## 2019-10-26 LAB — LIPID PANEL
Chol/HDL Ratio: 2.8 ratio (ref 0.0–5.0)
Cholesterol, Total: 138 mg/dL (ref 100–199)
HDL: 49 mg/dL (ref >39)
LDL Chol Calc (NIH): 71 mg/dL (ref 0–99)
Triglycerides: 98 mg/dL (ref 0–149)
VLDL Cholesterol Cal: 18 mg/dL (ref 5–40)

## 2019-11-03 ENCOUNTER — Telehealth: Payer: Self-pay | Admitting: Family Medicine

## 2019-11-03 NOTE — Telephone Encounter (Signed)
Left message for patient to call back  

## 2019-11-03 NOTE — Telephone Encounter (Signed)
Medication:Hydrocodone  Pharmacy: [facility] [address]

## 2019-11-03 NOTE — Telephone Encounter (Signed)
He needs a PMV 

## 2019-11-03 NOTE — Telephone Encounter (Signed)
Last filled 10/07/2019 Last OV 08/01/2019  Ok to fill?

## 2019-11-03 NOTE — Telephone Encounter (Signed)
Patient scheduled for PMV for Monday 11/06/2019 at 10:30 AM

## 2019-11-06 ENCOUNTER — Ambulatory Visit: Payer: Medicare PPO | Admitting: Family Medicine

## 2019-11-06 ENCOUNTER — Other Ambulatory Visit: Payer: Self-pay

## 2019-11-06 ENCOUNTER — Encounter: Payer: Self-pay | Admitting: Family Medicine

## 2019-11-06 VITALS — BP 130/60 | HR 68 | Temp 98.0°F | Wt 213.2 lb

## 2019-11-06 DIAGNOSIS — M8949 Other hypertrophic osteoarthropathy, multiple sites: Secondary | ICD-10-CM | POA: Diagnosis not present

## 2019-11-06 DIAGNOSIS — M159 Polyosteoarthritis, unspecified: Secondary | ICD-10-CM

## 2019-11-06 DIAGNOSIS — F1190 Opioid use, unspecified, uncomplicated: Secondary | ICD-10-CM

## 2019-11-06 MED ORDER — HYDROCODONE-ACETAMINOPHEN 10-325 MG PO TABS: 1.0000 | ORAL_TABLET | Freq: Four times a day (QID) | ORAL | 0 refills | Status: DC | PRN

## 2019-11-06 MED ORDER — HYDROCODONE-ACETAMINOPHEN 10-325 MG PO TABS: 1.0000 | ORAL_TABLET | Freq: Four times a day (QID) | ORAL | 0 refills | Status: AC | PRN

## 2019-11-06 NOTE — Progress Notes (Signed)
   Subjective:    Patient ID: [name], male    DOB: July 29, 1950, 69 y.o.   MRN: [id number]  HPI Duplicate   Review of Systems     Objective:   Physical Exam        Assessment & Plan:

## 2019-11-06 NOTE — Progress Notes (Signed)
   Subjective:    Patient ID: [name], male    DOB: 1950/10/11, 69 y.o.   MRN: [id number]  HPI Here for pain management. He is doing well.  Indication for chronic opioid: osteoarthritis  Medication and dose: Norco 10-325  # pills per month: 120 Last UDS date: 11-06-19 Opioid Treatment Agreement signed (Y/N): 11-01-17 Opioid Treatment Agreement last reviewed with patient:  11-06-19 [facility] reviewed this encounter (include red flags): Yes    Review of Systems     Objective:   Physical Exam        Assessment & Plan:  Pain management, meds were refilled.  Gershon Crane, MD

## 2019-11-09 LAB — DRUG MONITORING, PANEL 8 WITH CONFIRMATION, URINE
6 Acetylmorphine: NEGATIVE ng/mL (ref <10)
Alcohol Metabolites: NEGATIVE ng/mL
Amphetamines: NEGATIVE ng/mL (ref <500)
Benzodiazepines: NEGATIVE ng/mL (ref <100)
Buprenorphine, Urine: NEGATIVE ng/mL (ref <5)
Cocaine Metabolite: NEGATIVE ng/mL (ref <150)
Codeine: NEGATIVE ng/mL (ref <50)
Creatinine: 54.3 mg/dL
Hydrocodone: 226 ng/mL — ABNORMAL HIGH (ref <50)
Hydromorphone: NEGATIVE ng/mL (ref <50)
MDMA: NEGATIVE ng/mL (ref <500)
Marijuana Metabolite: NEGATIVE ng/mL (ref <20)
Morphine: NEGATIVE ng/mL (ref <50)
Norhydrocodone: 615 ng/mL — ABNORMAL HIGH (ref <50)
Opiates: POSITIVE ng/mL — AB (ref <100)
Oxidant: NEGATIVE ug/mL
Oxycodone: NEGATIVE ng/mL (ref <100)
pH: 5.4 (ref 4.5–9.0)

## 2019-11-09 LAB — DM TEMPLATE

## 2019-11-14 ENCOUNTER — Other Ambulatory Visit: Payer: Self-pay

## 2019-11-14 MED ORDER — AMLODIPINE BESYLATE 5 MG PO TABS: 5.0000 mg | ORAL_TABLET | Freq: Every day | ORAL | 1 refills | Status: DC

## 2019-11-26 ENCOUNTER — Other Ambulatory Visit: Payer: Self-pay | Admitting: Cardiovascular Disease

## 2019-11-27 NOTE — Telephone Encounter (Signed)
Rx request sent to pharmacy.  

## 2019-12-03 ENCOUNTER — Other Ambulatory Visit: Payer: Self-pay | Admitting: Cardiovascular Disease

## 2020-02-05 ENCOUNTER — Other Ambulatory Visit: Payer: Self-pay

## 2020-02-05 ENCOUNTER — Ambulatory Visit (HOSPITAL_COMMUNITY): Payer: Medicare PPO | Attending: Cardiology

## 2020-02-05 DIAGNOSIS — I5032 Chronic diastolic (congestive) heart failure: Secondary | ICD-10-CM | POA: Diagnosis not present

## 2020-02-05 DIAGNOSIS — E785 Hyperlipidemia, unspecified: Secondary | ICD-10-CM | POA: Insufficient documentation

## 2020-02-05 DIAGNOSIS — I25810 Atherosclerosis of coronary artery bypass graft(s) without angina pectoris: Secondary | ICD-10-CM | POA: Insufficient documentation

## 2020-02-05 DIAGNOSIS — Z951 Presence of aortocoronary bypass graft: Secondary | ICD-10-CM | POA: Insufficient documentation

## 2020-02-05 DIAGNOSIS — I10 Essential (primary) hypertension: Secondary | ICD-10-CM | POA: Diagnosis not present

## 2020-02-05 LAB — ECHOCARDIOGRAM COMPLETE
Area-P 1/2: 4.21 cm2
S' Lateral: 2.7 cm

## 2020-02-07 ENCOUNTER — Telehealth: Payer: Self-pay | Admitting: Cardiovascular Disease

## 2020-02-07 NOTE — Telephone Encounter (Signed)
STAT if patient feels like he/she is going to faint   1) Are you dizzy now?  Yes-wife is calling  2) Do you feel faint or have you passed out? *he does feel faint, have not passed out  3) Do you have any other symptoms? Had chest pains three weeks frequently  4) Have you checked your HR and BP (record if available)?  His blood pressure last night was 127/57- wife wants pt to be seen

## 2020-02-07 NOTE — Telephone Encounter (Signed)
Spoke to pt's wife who report for the last several weeks, pt has been experiencing dizziness and feeling light headed. She report this morning while blowing his nose, pt become really dizzy and had to sit down. Wife doesn't have a recent BP but last night was 127/57. Wife also report 2-3 weeks ago, pt was c/o a lot of chest pain. She report he hasn't experienced any this week, but she is very concerned.  Nurse attempted to schedule an appointment but the earliest appointment is not until 8/16. Will route to MD for recommendations.

## 2020-02-08 NOTE — Telephone Encounter (Signed)
Called and spoke with pt and pt's wife. Asked if he was still having issues. Wife states he has still been dizzy and having some chest pain and chest heaviness. Asked if pt is still having any chest pain currently wife stated pt shrugged his shoulders which means yes. Notified that if he is having active chest pain then he needs to be seen in the ED. Wife stated he would not go because he is not going to wait in the ED for hours. She states that if it got worse she would make him go to the ED and pt agreed over the phone.  Was able to schedule pt for appt with Dr.Kelly on 02/12/20 at 8:40am due to a cancellation.  Wife verbalized understanding with no other questions at this time.

## 2020-02-08 NOTE — Telephone Encounter (Signed)
Patient's wife called to inquire on status of call.

## 2020-02-12 ENCOUNTER — Ambulatory Visit (INDEPENDENT_AMBULATORY_CARE_PROVIDER_SITE_OTHER): Payer: Medicare PPO | Admitting: Cardiovascular Disease

## 2020-02-12 ENCOUNTER — Other Ambulatory Visit: Payer: Self-pay

## 2020-02-12 ENCOUNTER — Encounter: Payer: Self-pay | Admitting: Cardiovascular Disease

## 2020-02-12 DIAGNOSIS — E118 Type 2 diabetes mellitus with unspecified complications: Secondary | ICD-10-CM

## 2020-02-12 DIAGNOSIS — R072 Precordial pain: Secondary | ICD-10-CM

## 2020-02-12 DIAGNOSIS — Z951 Presence of aortocoronary bypass graft: Secondary | ICD-10-CM

## 2020-02-12 DIAGNOSIS — I10 Essential (primary) hypertension: Secondary | ICD-10-CM

## 2020-02-12 DIAGNOSIS — E785 Hyperlipidemia, unspecified: Secondary | ICD-10-CM | POA: Diagnosis not present

## 2020-02-12 DIAGNOSIS — I519 Heart disease, unspecified: Secondary | ICD-10-CM | POA: Diagnosis not present

## 2020-02-12 DIAGNOSIS — I25709 Atherosclerosis of coronary artery bypass graft(s), unspecified, with unspecified angina pectoris: Secondary | ICD-10-CM | POA: Diagnosis not present

## 2020-02-12 DIAGNOSIS — J60 Coalworker's pneumoconiosis: Secondary | ICD-10-CM

## 2020-02-12 DIAGNOSIS — I5189 Other ill-defined heart diseases: Secondary | ICD-10-CM

## 2020-02-12 NOTE — Progress Notes (Signed)
Patient ID: Glenda H Staver, male   DOB: 12/02/1950, 69 y.o.   MRN: [id number]      Primary M.D.: Dr. Stephen Fry  HPI: Jarmarcus H Marquis is a 69 y.o. male presents to the office today for a 5 month  follow up cardiology evaluation.   Mr. Redford Sookdeo has CAD and in June 2002 underwent CABG surgery by Dr. Hendrickson with a sequential LIMA graft to the second diagonal and LAD, and sequential radial graft to the first and third diagonal branches. An echo Doppler study in 2010 showed normal systolic function with mild MR and trace TR.  A  nuclear perfusion study in March 2013 showed normal perfusion and function without scar or ischemia.  Additional problems include hypertension, type 2 diabetes mellitus, and hyperlipidemia. Remotely, he had worked as a coal minor and in the past was some question of possible black lung. 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  In November 2015 he complained of a slight change in symptomatology with exertional shortness of breath and some arm weakness.  Prior to bypass surgery, he had never experienced chest pain but demonstrated exertional shortness of breath and arm discomfort.  He admitted to fatigue.  He denied any major episodes of chest pressure.  A 2-D echo Doppler study on 06/14/2014 showed an ejection fraction of 55-60% with mild focal basal hypertrophy of the septum; grade 1 diastolic dysfunction.  There was evidence for mild mitral regurgitation and moderate left atrial dilatation.  A nuclear perfusion study on December 11,2015 was interpreted as low risk.  He developed 1-2 mm of horizontal ST segment depression and had normal scintigraphic myocardial perfusion images.  Ejection fraction was 57%.  There was normal LV function without wall motion abnormality.  He has a history of hyperlipidemia and remotely self increased his simvastatin from 40 mg to 80 mg and developed occasional myalgias.   He has been on Bystolic 10 mg and lisinopril 30 mg for his hypertension.  He is on metformin 1000 mg twice a day as well as DiaBeta 5 mg for his type 2 diabetes mellitus.  When I  saw him in October 2018, he had stopped taking Livalo for approximately one month.  He had been busy doing home projects and denied any recurrent chest pain.  During his last evaluation.  I had a lengthy discussion with him and recommended another attempt at a trial of Livalo.  I also added supplemental coenzyme Q 10.  Over this time period, he was able to reinitiate 2 mg daily and has been able to tolerate this without myalgias.  Subsequent blood work did show improvement in his lipid status on 08/04/2017 with a total cholesterol 149, LDL 82.  Triglycerides 148, and HDL 37.  He has continued to be on Januvia and metformin for his diabetes mellitus.  Hypertension.  He has remained on amlodipine 5 mg, lisinopril 30 mg, and Bystolic 10 mg.   When evaluated in March 2019 he was tolerating Livalo and I recommended further titration to 4 mg daily and attempt to obtain an LDL less than 70.  Since I saw him, he has had issues with poorly controlled diabetes mellitus.  Apparently he was started on farziga but developed some dizziness secondary to this.  He is now on metformin, Januvia, and was started on Actos.  Earlier this year he had a fall off a ladder.   I  saw him in October 2019.    He required rotator cuff surgery on his right shoulder.  His surgery was delayed due to initial glucose issues.  Since it is been over 17 years since his CABG revascularization, I recommended that he undergo a preoperative exercise Myoview study for preoperative assessment and clearance.  He underwent a nuclear perfusion study on May 05, 2018.  This was interpreted as low risk with normal perfusion and mildly reduced left ventricular global systolic function with an EF of 48%.  He developed horizontal ST segment depression of 1.5 mm during stress which  returned to baseline at 5 to 9 minutes into the recovery period.  When compared to his prior nuclear perfusion study of 2015 this was not significantly changed and on that study he also had normal perfusion with positive ECG changes.  A subsequent echo Doppler study on May 30, 2018 showed an EF of 55 to 60%.  There was severe LVH.  There was grade 1 diastolic dysfunction with mild MR and mild LA dilation.  He denies any chest pain.  He has had periods of increased cough following prolonged coughs he may feel somewhat atypical chest pain discomfort.  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 Lake Bells.  He had his pulmonary evaluation with Dr. 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  I last saw him in March 2021.  Over the prior year he had remained stable from a cardiovascular standpoint.  He was not having anginal symptomatology 18-1/2 years following his CABG revascularization.  He developed Covid infection and was very weak and fatigued for 5 days.  In addition his son died in 12-13-18 after he developed a seizure while driving a motorcycle and was around 70 years old.  In addition, his sister died at age 69 secondary to cancer.  He continues to be on amlodipine 5 mg, isosorbide 30 mg, lisinopril 40 mg and Bystolic 10 mg for hypertension.  He is on Zetia and rosuvastatin 10 mg for hyperlipidemia in addition to omega-3 fatty acids.  He is diabetic on Metformin, Farxiga, and Actos.    Only, Mr. Esty has done fairly well.  However, he  admits to experiencing some episodes of chest pressure over the past 6 to 8 weeks.  Mostly this occurs without exertion and oftentimes occurs when he goes more in a reclining position when on his recliner chair.  However, he has noticed some vague symptomatology with walking.  However he walks his dogs regularly and often runs with them and denies any chest pressure when he is running with his dogs.  He does note some mild lightheadedness when he bends over and its up abruptly.  He is unaware of palpitations.  He underwent an echo Doppler study in February 05, 2020 which showed an EF of 55 to 60% and grade 2 diastolic dysfunction.  There was mild mitral gravitation, and mild aortic valve sclerosis.  The ascending aorta measured 38 mm.  Laboratory in April 2021 showed an LDL of 71 on Zetia and rosuvastatin.  He continues to be on Metformin, Farxiga, and Actos  for his diabetes mellitus.  He presents for reevaluation.  Past Medical History:  Diagnosis Date  . CAD (coronary artery disease)    nuclear stress test-09/09/11 low risk scan EF61%, sees Dr. Claiborne Billings   . Chronic headache   . Chronic neck pain   . Diabetes mellitus   . Hyperlipidemia   . Hypertension 12/26/08   ECHO- EF>55%  . Insomnia     Past Surgical History:  Procedure Laterality Date  . APPENDECTOMY    . cervical spine injection    . COLONOSCOPY  04-02-06    per [facility], clear, repeat in 10 yrs  . CORONARY ARTERY BYPASS GRAFT  2002   4 vessels    Allergies  Allergen Reactions  . Codeine Anaphylaxis    Pt. verbalized that he has taken and can take Hydrocodone, Oxycodone w/o issue - codeine allergy only  . Crestor [Rosuvastatin]     Myalgia   . Isosorbide Nitrate Other (See Comments)    Headaches  . Lipitor [Atorvastatin]     myalgia  . Zolpidem Tartrate     REACTION: HA's  . Ciprofloxacin Hives and Rash  . Victoza [Liraglutide] Rash    Current Outpatient Medications  Medication Sig Dispense Refill  . ACCU-CHEK AVIVA  PLUS test strip TEST SUGAR ONCE A DAY AND DIAGNOSIS CODE IS E 11.9 100 each 1  . amLODipine (NORVASC) 5 MG tablet Take 1 tablet (5 mg total) by mouth daily. 90 tablet 1  . aspirin 81 MG tablet Take 81 mg by mouth daily.     Marland Kitchen BYSTOLIC 10 MG tablet TAKE 1 TABLET BY MOUTH EVERY DAY (Patient taking differently: 5 mg. ) 90 tablet 3  . Calcium Carbonate-Vitamin D (CALCIUM 600+D PO) Take 1 tablet by mouth daily.    . clonazePAM (KLONOPIN) 0.5 MG tablet TAKE 1 TABLET BY MOUTH TWICE A DAY 60 tablet 5  . Coenzyme Q10 (COQ10) 100 MG CAPS Take 100 mg by mouth daily.     . dapagliflozin propanediol (FARXIGA) 10 MG TABS tablet Take 10 mg by mouth daily. 30 tablet 11  . ezetimibe (ZETIA) 10 MG tablet TAKE 1 TABLET BY MOUTH EVERY DAY 90 tablet 1  . fexofenadine (ALLEGRA) 180 MG tablet Take 180 mg by mouth daily as needed for allergies or rhinitis.    Marland Kitchen gabapentin (NEURONTIN) 300 MG capsule TAKE 3 CAPSULES BY MOUTH AT BEDTIME 270 capsule 0  . Insulin Pen Needle (PEN NEEDLES) 31G X 8 MM MISC Use daily with victoza insulin 50 each 6  . isosorbide mononitrate (IMDUR) 30 MG 24 hr tablet     . Lancets MISC Fast click lancet accu check may substitute for whever pt has 100 each 3  . lisinopril (ZESTRIL) 40 MG tablet TAKE 1 TABLET BY MOUTH EVERY DAY 90 tablet 3  . meloxicam (MOBIC) 15 MG tablet TAKE 1 TABLET BY MOUTH EVERY DAY 90 tablet 3  . metFORMIN (GLUCOPHAGE) 1000 MG tablet Take 1 tablet (1,000 mg total) by mouth 2 (two) times daily. 180 tablet 3  . Omega-3 350 MG CPDR Take 350 mg by mouth daily.    . pioglitazone (ACTOS) 45 MG tablet Take 1 tablet (45 mg total) by mouth daily. 90 tablet 3  . rosuvastatin (CRESTOR) 10 MG tablet TAKE 1 TABLET BY MOUTH EVERY DAY 90 tablet 0  . sulfamethoxazole-trimethoprim (BACTRIM DS,SEPTRA DS) 800-160 MG tablet Take 1 tablet by mouth 2 (two) times daily as needed (bladder infections).    . nitroGLYCERIN (NITROSTAT) 0.4 MG  SL tablet Place 1 tablet (0.4 mg total) under the tongue  every 5 (five) minutes as needed for chest pain. 25 tablet 6   No current facility-administered medications for this visit.    Social History   Socioeconomic History  . Marital status: Married    Spouse name: Not on file  . Number of children: Not on file  . Years of education: Not on file  . Highest education level: Not on file  Occupational History  . Not on file  Tobacco Use  . Smoking status: Former Smoker    Packs/day: 0.25    Years: 10.00    Pack years: 2.50    Types: Cigarettes    Quit date: 11/26/1970    Years since quitting: 49.2  . Smokeless tobacco: Never Used  Vaping Use  . Vaping Use: Never used  Substance and Sexual Activity  . Alcohol use: No    Alcohol/week: 0.0 standard drinks  . Drug use: No  . Sexual activity: Not on file  Other Topics Concern  . Not on file  Social History Narrative  . Not on file   Social Determinants of Health   Financial Resource Strain:   . Difficulty of Paying Living Expenses:   Food Insecurity:   . Worried About Running Out of Food in the Last Year:   . Ran Out of Food in the Last Year:   Transportation Needs:   . Lack of Transportation (Medical):   . Lack of Transportation (Non-Medical):   Physical Activity:   . Days of Exercise per Week:   . Minutes of Exercise per Session:   Stress:   . Feeling of Stress :   Social Connections:   . Frequency of Communication with Friends and Family:   . Frequency of Social Gatherings with Friends and Family:   . Attends Religious Services:   . Active Member of Clubs or Organizations:   . Attends Club or Organization Meetings:   . Marital Status:   Intimate Partner Violence:   . Fear of Current or Ex-Partner:   . Emotionally Abused:   . Physically Abused:   . Sexually Abused:     Family History  Problem Relation Age of Onset  . Liver cancer Father   . Cancer Brother   . Cancer Brother   . Cancer Brother   . Coronary artery disease Other   . Hypertension Other   .  Heart disease Mother   . Colon cancer Neg Hx   . Stomach cancer Neg Hx   . Esophageal cancer Neg Hx    Socially he is married. He has 3 children 8 grandchildren and 4 great-grandchildren. There is no tobacco or alcohol use. He is now retired.   ROS General: Negative; No fevers, chills, or night sweats;  HEENT: Decrease in hearing ,No changes in vision  sinus congestion, difficulty swallowing Pulmonary: History of "black lung" Cardiovascular: See history of present illness GI: Negative; No nausea, vomiting, diarrhea, or abdominal pain GU: Negative; No dysuria, hematuria, or difficulty voiding Musculoskeletal: Negative; no myalgias, joint pain, or weakness Hematologic/Oncology: Negative; no easy bruising, bleeding Endocrine: Positive for diabetes mellitus Neuro: Positive for paresthesias in his legs. Skin: Negative; No rashes or skin lesions Psychiatric: Negative; No behavioral problems, depression Sleep: Positive for restless legs; No snoring, daytime sleepiness, hypersomnolence, bruxism, hypnogognic hallucinations, no cataplexy Other comprehensive 14 point system review is negative.   PE BP 140/72   Pulse (!) 57   Temp (!) 96.6 F (35.9 C)     Ht 5' 9.5" (1.765 m)   Wt 211 lb 12.8 oz (96.1 kg)   SpO2 98%   BMI 30.83 kg/m   Repeat blood pressure by me was 130/70 supine and 130/74 standing  Wt Readings from Last 3 Encounters:  02/12/20 211 lb 12.8 oz (96.1 kg)  11/06/19 213 lb 3.2 oz (96.7 kg)  10/25/19 213 lb 6.4 oz (96.8 kg)   General: Alert, oriented, no distress.  Skin: normal turgor, no rashes, warm and dry HEENT: Normocephalic, atraumatic. Pupils equal round and reactive to light; sclera anicteric; extraocular muscles intact;  Nose without nasal septal hypertrophy Mouth/Parynx benign; Mallinpatti scale 3 Neck: No JVD, no carotid bruits; normal carotid upstroke Lungs: clear to ausculatation and percussion; no wheezing or rales Chest wall: without tenderness to  palpitation Heart: PMI not displaced, RRR, s1 s2 normal, 1/6 systolic murmur, no diastolic murmur, no rubs, gallops, thrills, or heaves Abdomen: soft, nontender; no hepatosplenomehaly, BS+; abdominal aorta nontender and not dilated by palpation. Back: no CVA tenderness Pulses 2+ Musculoskeletal: full range of motion, normal strength, no joint deformities Extremities: no clubbing cyanosis or edema, Homan's sign negative  Neurologic: grossly nonfocal; Cranial nerves grossly wnl Psychologic: Normal mood and affect  ECG (independently read by me): Sinus bradycardia 57 bpm.  Early transition.  No ectopy.  No ST segment changes.  March 2021 ECG (independently read by me): Sinus bradycardia 59 bpm.  Nonspecific ST abnormality.  Normal intervals.  No ectopy.  December 2019 ECG (independently read by me): Normal sinus rhythm at 61 bpm.  Early transition.  No ectopy.  No ST segment changes.  October 2019 ECG (independently read by me): Sinus bradycardia 53 bpm.  Early transition.  No ectopy.  March 2019 ECG (independently read by me): Sinus bradycardia 59 bpm.  Early transition.  Normal intervals.  October 2018 ECG (independently read by me): Normal sinus rhythm at 60 bpm.  Nonspecific T wave abnormality.  Normal intervals.  No ectopy.  May 2018 ECG (independently read by me): Sinus bradycardia 59 bpm.  No significant ST-T changes.  Early transition.  April 2017 ECG (independently read by me): Sinus bradycardia at 53 bpm.  Q waves in III and F.  March 2016 ECG (independently read by me): Normal sinus rhythm at 67.  No ectopy.  November 2015 ECG (independently read by me): Normal sinus rhythm at 65 bpm.  Early transition.  No significant ST segment changes.  Normal intervals.  Prior ECG: Normal sinus rhythm at 58 beats per minute. QTc interval 428 ms. PR interval 170 ms. Nonspecific ST changes   LABS:  BMP Latest Ref Rng & Units 10/25/2019 12/22/2018 04/05/2018  Glucose 65 - 99 mg/dL 120(H)  139(H) 130(H)  BUN 8 - 27 mg/dL _0 Creatinine 0.76 - 1.27 mg/dL 0.82 0.90 0.93  BUN/Creat Ratio 10 - _1 Sodium 134 - 144 mmol/L 144 139 143  Potassium 3.5 - 5.2 mmol/L 4.8 4.8 3.8  Chloride 96 - 106 mmol/L 105 101 100  CO2 20 - 29 mmol/L _2 Calcium 8.6 - 10.2 mg/dL 9.6 10.1 9.3    Hepatic Function Latest Ref Rng & Units 10/25/2019 12/22/2018 04/05/2018  Total Protein 6.0 - 8.5 g/dL 6.8 7.0 6.9  Albumin 3.8 - 4.8 g/dL 4.2 4.6 4.5  AST 0 - 40 IU/L 38 21 26  ALT 0 - 44 IU/L _3 Alk Phosphatase 39 - 117 IU/L 72 84 73  Total Bilirubin 0.0 -  1.2 mg/dL 0.3 0.3 0.3  Bilirubin, Direct 0.0 - 0.3 mg/dL - - -    CBC Latest Ref Rng & Units 10/25/2019 02/18/2018 08/04/2017  WBC 3.4 - 10.8 x10E3/uL 6.1 6.8 7.4  Hemoglobin 13.0 - 17.7 g/dL 13.8 13.9 14.5  Hematocrit 37.5 - 51.0 % 42.6 41.5 42.9  Platelets 150 - 450 x10E3/uL 200 166 191.0   Lab Results  Component Value Date   MCV 89 10/25/2019   MCV 89.1 02/18/2018   MCV 88.6 08/04/2017    Lab Results  Component Value Date   TSH 3.800 10/25/2019   Lab Results  Component Value Date   HGBA1C 7.5 (A) 10/25/2019    BNP No results found for: PROBNP  Lipid Panel     Component Value Date/Time   CHOL 138 10/25/2019 1711   CHOL 173 05/24/2013 1049   TRIG 98 10/25/2019 1711   TRIG 185 (H) 05/24/2013 1049   HDL 49 10/25/2019 1711   HDL 39 (L) 05/24/2013 1049   CHOLHDL 2.8 10/25/2019 1711   CHOLHDL 3.4 11/22/2017 0846   VLDL 29.6 08/04/2017 1101   LDLCALC 71 10/25/2019 1711   LDLCALC 84 11/22/2017 0846   LDLCALC 97 05/24/2013 1049     RADIOLOGY: No results found.  IMPRESSION:  1. Precordial pain   2. Coronary artery disease involving coronary bypass graft of native heart with angina pectoris ([place])   3. Hx of CABG   4. Hyperlipidemia LDL goal <70   5. Essential hypertension   6. Grade II diastolic dysfunction   7. Type 2 diabetes mellitus with complication, without long-term current use of insulin  ([place])   8. Black lung [facility])    ASSESSMENT AND PLAN: Mr. Mora Appl is a 69 year old Caucasian male who underwent CABG revascularization surgery by Dr. Roxan Hockey in November 2002.  Remotely he had noticed mild shortness of breath and fatigue and never experienced classic substernal chest pressure.  A nuclear perfusion study in 2015 continued to show normal perfusion but he developed ST segment changes of 1-2 mm.  Amlodipine was added to his medical regimen and since institution he has felt improved.   A 4-year follow-up nuclear stress test in 2019 anticipation of potential future shoulder surgery  was unchanged from previously and continued to show normal perfusion but again he developed asymptomatic ST depression which lasted 5 to 9 minutes into the recovery period for resolution.  He never had a shoulder surgery.  Presently, his blood pressure is stable on his regimen now consisting of amlodipine 5 mg, Bystolic 5 mg, lisinopril 40 mg daily and he continues to take isosorbide 30 mg.  He has experienced some episodes of chest pressure.  Oftentimes this seems to occur nonexertionally when he is sitting in a recliner and gets to more of an incline.  However he also has noticed some vague discomfort with walking.  However when he walks his dog and often runs with them he does not notice any exertional chest tightness.  Continues to take Farxiga 10 mg, Metformin 1000 mg twice a day and Actos 45 mg daily for his diabetes mellitus and had recently seen Dr. Kelton Pillar for endocrinologic follow-up evaluation.  He is now 19 years status post CABG revascularization.  With his episodes of some chest pressure I have a recommended he undergo a follow-up nuclear perfusion study.  He would prefer to do this in the pharmacologic at that and I will schedule him for a Lexiscan Myoview study.  Reviewed his most recent echo Doppler  study which continues to show normal systolic function without wall motion abnormalities but  demonstrates grade 2 diastolic dysfunction, mild MR and mild aortic sclerosis.  I reviewed recent laboratory.  I will contact him regarding his nuclear study.  If this is unchanged I will see him in 6 months but if there are changes compared to prior evaluation sooner evaluation will be scheduled.   Thomas A. Kelly, MD, FACC  02/12/2020 9:06 AM    

## 2020-02-12 NOTE — Patient Instructions (Signed)
Medication Instructions:  CONTINUE WITH CURRENT MEDICATIONS. NO CHANGES.  *If you need a refill on your cardiac medications before your next appointment, please call your pharmacy*   Testing/Procedures: Your physician has requested that you have a lexiscan myoview. For further information please visit [email or web address] Please follow instruction sheet, as given.    Follow-Up: At [facility], you and your health needs are our priority.  As part of our continuing mission to provide you with exceptional heart care, we have created designated Provider Care Teams.  These Care Teams include your primary Cardiologist (physician) and Advanced Practice Providers (APPs -  Physician Assistants and Nurse Practitioners) who all work together to provide you with the care you need, when you need it.  We recommend signing up for the patient portal called "MyChart".  Sign up information is provided on this After Visit Summary.  MyChart is used to connect with patients for Virtual Visits (Telemedicine).  Patients are able to view lab/test results, encounter notes, upcoming appointments, etc.  Non-urgent messages can be sent to your provider as well.   To learn more about what you can do with MyChart, go to [email or web address].    Your next appointment:   6 month(s)  The format for your next appointment:   In Person  Provider:   Nicki Gu[place], MD

## 2020-03-01 ENCOUNTER — Other Ambulatory Visit: Payer: Self-pay | Admitting: Cardiovascular Disease

## 2020-03-04 ENCOUNTER — Other Ambulatory Visit: Payer: Self-pay | Admitting: Family Medicine

## 2020-03-04 ENCOUNTER — Other Ambulatory Visit: Payer: Self-pay

## 2020-03-04 ENCOUNTER — Other Ambulatory Visit: Payer: Self-pay | Admitting: Cardiovascular Disease

## 2020-03-04 MED ORDER — AMLODIPINE BESYLATE 5 MG PO TABS
5.0000 mg | ORAL_TABLET | Freq: Every day | ORAL | 3 refills | Status: DC
Start: 2020-03-04 — End: 2020-08-15

## 2020-03-29 ENCOUNTER — Other Ambulatory Visit: Payer: Self-pay | Admitting: Cardiovascular Disease

## 2020-04-05 ENCOUNTER — Ambulatory Visit (HOSPITAL_COMMUNITY)
Admission: RE | Admit: 2020-04-05 | Discharge: 2020-04-05 | Disposition: A | Discharge status: Home / Self Care | Payer: Medicare PPO | Source: Ambulatory Visit | Attending: Cardiovascular Disease | Admitting: Cardiovascular Disease

## 2020-04-05 ENCOUNTER — Other Ambulatory Visit: Payer: Self-pay

## 2020-04-05 DIAGNOSIS — R072 Precordial pain: Secondary | ICD-10-CM | POA: Insufficient documentation

## 2020-04-05 LAB — MYOCARDIAL PERFUSION IMAGING
LV dias vol: 157 mL (ref 62–150)
LV sys vol: 71 mL
Peak HR: 85 {beats}/min
Rest HR: 57 {beats}/min
SDS: 2
SRS: 1
SSS: 3
TID: 1.07

## 2020-04-05 MED ORDER — TECHNETIUM TC 99M TETROFOSMIN IV KIT
10.4000 | PACK | Freq: Once | INTRAVENOUS | Status: AC | PRN
  Administered 2020-04-05: 10.4 via INTRAVENOUS
  Filled 2020-04-05: qty 11

## 2020-04-05 MED ORDER — REGADENOSON 0.4 MG/5ML IV SOLN
0.4000 mg | Freq: Once | INTRAVENOUS | Status: AC
  Administered 2020-04-05: 0.4 mg via INTRAVENOUS

## 2020-04-05 MED ORDER — TECHNETIUM TC 99M TETROFOSMIN IV KIT
32.4000 | PACK | Freq: Once | INTRAVENOUS | Status: AC | PRN
  Administered 2020-04-05: 32.4 via INTRAVENOUS
  Filled 2020-04-05: qty 33

## 2020-04-12 ENCOUNTER — Other Ambulatory Visit: Payer: Self-pay | Admitting: Cardiovascular Disease

## 2020-04-12 ENCOUNTER — Other Ambulatory Visit: Payer: Self-pay | Admitting: Family Medicine

## 2020-04-14 ENCOUNTER — Other Ambulatory Visit: Payer: Self-pay | Admitting: Family Medicine

## 2020-04-25 ENCOUNTER — Encounter: Payer: Self-pay | Admitting: Internal Medicine

## 2020-04-25 ENCOUNTER — Other Ambulatory Visit: Payer: Self-pay

## 2020-04-25 ENCOUNTER — Ambulatory Visit (INDEPENDENT_AMBULATORY_CARE_PROVIDER_SITE_OTHER): Payer: Medicare PPO | Admitting: Internal Medicine

## 2020-04-25 VITALS — BP 136/82 | HR 62 | Temp 97.2°F | Ht 69.0 in | Wt 209.0 lb

## 2020-04-25 DIAGNOSIS — E1159 Type 2 diabetes mellitus with other circulatory complications: Secondary | ICD-10-CM

## 2020-04-25 DIAGNOSIS — E1142 Type 2 diabetes mellitus with diabetic polyneuropathy: Secondary | ICD-10-CM

## 2020-04-25 LAB — POCT GLYCOSYLATED HEMOGLOBIN (HGB A1C): Hemoglobin A1C: 7.4 % — AB (ref 4.0–5.6)

## 2020-04-25 LAB — GLUCOSE, POCT (MANUAL RESULT ENTRY): POC Glucose: 151 mg/dl — AB (ref 70–99)

## 2020-04-25 MED ORDER — GLIPIZIDE 5 MG PO TABS: 5.0000 mg | ORAL_TABLET | Freq: Every day | ORAL | 3 refills | Status: DC

## 2020-04-25 NOTE — Progress Notes (Signed)
Name: [name]   Age/ Sex: 69 y.o. male   MRN/ DOB: [id number]/ April 08, 1951     PCP: Nelwyn Salisbury, MD   Reason for Endocrinology Evaluation: Type 2 Diabetes Mellitus  Initial Endocrine Consultative Visit: 08/31/2018    PATIENT IDENTIFIER: [name] is a 69 y.o. male with a past medical history of T2DM,CAD (S/P CABG). The patient has followed with Endocrinology clinic since 08/31/2018 for consultative assistance with management of his diabetes.  DIABETIC HISTORY:  [name] was diagnosed with T2DM in 2010, he has been on Metformin and Actos for many years, he is intolerant to Victoza due to rash. Januvia has been ineffective , which he stopped 08/2018. He has never been on insulin . His hemoglobin A1c has ranged from 6.1% in 2017, peaking at 10.4% in 2019  On his initial presentation his A1c was 8.5% . He was on Metformin and Actos   SUBJECTIVE:   During the last visit (10/25/2019): A1c 7.5%. Continued Farxiga, Metformin and Actos  Today (04/25/2020): [name] is here with his wife for a  follow up visit on his diabetes.He checks his sugar occasionally.  Has a torn right shoulder tendon, has weaned himself off narcotics and feels so much better His Gabapentin has increased due to feet burning, this is controlled   HOME DIABETES REGIMEN:  Metformin 1000 mg BID  Actos 45 mg daily  Farxiga 10 mg daily    GLUCOSE LOG : Did not bring    HISTORY:  Past Medical History:  Past Medical History:  Diagnosis Date  . CAD (coronary artery disease)    nuclear stress test-09/09/11 low risk scan EF61%, sees Dr. Tresa Endo   . Chronic headache   . Chronic neck pain   . Diabetes mellitus   . Hyperlipidemia   . Hypertension 12/26/08   ECHO- EF>55%  . Insomnia    Past Surgical History:  Past Surgical History:  Procedure Laterality Date  . APPENDECTOMY    . cervical spine injection    . COLONOSCOPY  04-02-06    per [facility], clear, repeat in 10 yrs  .  CORONARY ARTERY BYPASS GRAFT  2002   4 vessels    Social History:  reports that he quit smoking about 49 years ago. His smoking use included cigarettes. He has a 2.50 pack-year smoking history. He has never used smokeless tobacco. He reports that he does not drink alcohol and does not use drugs. Family History:  Family History  Problem Relation Age of Onset  . Liver cancer Father   . Cancer Brother   . Cancer Brother   . Cancer Brother   . Coronary artery disease Other   . Hypertension Other   . Heart disease Mother   . Colon cancer Neg Hx   . Stomach cancer Neg Hx   . Esophageal cancer Neg Hx      HOME MEDICATIONS: Allergies as of 04/25/2020      Reactions   Codeine Anaphylaxis   Pt. verbalized that he has taken and can take Hydrocodone, Oxycodone w/o issue - codeine allergy only   Crestor [rosuvastatin]    Myalgia    Isosorbide Nitrate Other (See Comments)   Headaches   Lipitor [atorvastatin]    myalgia   Zolpidem Tartrate    REACTION: HA's   Ciprofloxacin Hives, Rash   Victoza [liraglutide] Rash      Medication List       Accurate as of April 25, 2020  7:38 AM. If you have any questions, ask your nurse or doctor.        Accu-Chek Aviva Plus test strip Generic drug: glucose blood TEST SUGAR ONCE A DAY AND DIAGNOSIS CODE IS E 11.9   amLODipine 5 MG tablet Commonly known as: NORVASC Take 1 tablet (5 mg total) by mouth daily.   aspirin 81 MG tablet Take 81 mg by mouth daily.   CALCIUM 600+D PO Take 1 tablet by mouth daily.   clonazePAM 0.5 MG tablet Commonly known as: KLONOPIN TAKE 1 TABLET BY MOUTH TWICE A DAY   CoQ10 100 MG Caps Take 100 mg by mouth daily.   ezetimibe 10 MG tablet Commonly known as: ZETIA TAKE 1 TABLET BY MOUTH EVERY DAY   Farxiga 10 MG Tabs tablet Generic drug: dapagliflozin propanediol Take 10 mg by mouth daily.   fexofenadine 180 MG tablet Commonly known as: ALLEGRA Take 180 mg by mouth daily as needed for allergies or  rhinitis.   gabapentin 300 MG capsule Commonly known as: NEURONTIN TAKE 3 CAPSULES BY MOUTH AT BEDTIME   isosorbide mononitrate 30 MG 24 hr tablet Commonly known as: IMDUR   Lancets Misc Fast click lancet accu check may substitute for whever pt has   lisinopril 40 MG tablet Commonly known as: ZESTRIL TAKE 1 TABLET BY MOUTH EVERY DAY   meloxicam 15 MG tablet Commonly known as: MOBIC TAKE 1 TABLET BY MOUTH EVERY DAY   metFORMIN 1000 MG tablet Commonly known as: GLUCOPHAGE Take 1 tablet (1,000 mg total) by mouth 2 (two) times daily.   nebivolol 10 MG tablet Commonly known as: Bystolic Take 0.5 tablets (5 mg total) by mouth daily.   nitroGLYCERIN 0.4 MG SL tablet Commonly known as: NITROSTAT Place 1 tablet (0.4 mg total) under the tongue every 5 (five) minutes as needed for chest pain.   Omega-3 350 MG Cpdr Take 350 mg by mouth daily.   Pen Needles 31G X 8 MM Misc Use daily with victoza insulin   pioglitazone 45 MG tablet Commonly known as: ACTOS Take 1 tablet (45 mg total) by mouth daily.   rosuvastatin 10 MG tablet Commonly known as: CRESTOR TAKE 1 TABLET BY MOUTH EVERY DAY   sulfamethoxazole-trimethoprim 800-160 MG tablet Commonly known as: BACTRIM DS Take 1 tablet by mouth 2 (two) times daily as needed (bladder infections).      PHYSICAL EXAM: VS: BP 136/82   Pulse 62   Temp (!) 97.2 F (36.2 C) (Temporal)   Ht 5\' 9"  (1.753 m)   Wt 209 lb (94.8 kg)   SpO2 98%   BMI 30.86 kg/m    EXAM: General: Pt appears well and is in NAD  Lungs: Clear with good BS bilat with no rales, rhonchi, or wheezes  Heart: Auscultation: RRR with normal S1 and S2, no gallops or murmurs  Abdomen: Normoactive bowel sounds, soft, nontender, without masses or organomegaly palpable  Extremities: BL LE: no pretibial edema normal ROM and strength, no joint enlargement or tenderness  Mental Status: Judgment, insight: intact Mood and affect: no depression, anxiety, or agitation    DM Foot Exam 10/25/2019 The skin of the feet is intact without sores or ulcerations. The pedal pulses are 2+ on right and 2+ on left. The sensation is intact to a screening 5.07, 10 gram monofilament bilaterally    DATA REVIEWED:  Lab Results  Component Value Date   HGBA1C 7.4 (A) 04/25/2020   HGBA1C 7.5 (A) 10/25/2019   HGBA1C 8.5 (A) 08/09/2018  Lab Results  Component Value Date   MICROALBUR 0.5 08/21/2008   LDLCALC 71 10/25/2019   CREATININE 0.82 10/25/2019    Lab Results  Component Value Date   CHOL 138 10/25/2019   HDL 49 10/25/2019   LDLCALC 71 10/25/2019   LDLDIRECT 128.0 10/29/2011   TRIG 98 10/25/2019   CHOLHDL 2.8 10/25/2019       In-office Bg 151 mg/dL   ASSESSMENT / PLAN / RECOMMENDATIONS:   1) Type 2 Diabetes Mellitus, Acceptable control, With neuropathic and macrovascular complications - Most recent A1c of 7.4 %. Goal A1c < 7.5 %.     -A1c continues to be stable at 7.4 %  - Praised pt on weight loss  - Will add Glipizide , cautioned against hypoglycemia and weight gain. He was advised to contact us with low BG's , will consider reducing Glipizide to half a tablet.       MEDICATIONS:  Start Glipizide 5 mg , 1 tablet before breakfast   Continue Farxiga 10 mg daily  Continue Metformin 1000 mg BID  Continue Actos 45 mg daily   EDUCATION / INSTRUCTIONS:  BG monitoring instructions: Patient is instructed to check his blood sugars 2 times a day, fasting and bedtime.  Call [facility] if: BG persistently < 70  . I reviewed the Rule of 15 for the treatment of hypoglycemia in detail with the patient. Literature supplied.      F/U in 6 months    Signed electronically by: Lyndle Herrlich, MD  [facility]  [facility] [address] [place], [place] [address] Phone: [phone] FAX: [phone]   CC: Nelwyn Salisbury, MD [address] [place] [address] Phone:  [phone]  Fax: [phone]  Return to Endocrinology clinic as below: Future Appointments  Date Time Provider Department Center  08/15/2020  8:00 AM Lennette Bihari, MD [facility]

## 2020-04-25 NOTE — Patient Instructions (Addendum)
-   Start Glipizide 5 mg, 1 tablet before Breakfast  - Continue Metformin 1000 mg Twice a day  - Continue Actos 45 mg daily  - Continue Farxiga 10 mg once a day      HOW TO TREAT LOW BLOOD SUGARS (Blood sugar LESS THAN 70 MG/DL)  Please follow the RULE OF 15 for the treatment of hypoglycemia treatment (when your (blood sugars are less than 70 mg/dL)    STEP 1: Take 15 grams of carbohydrates when your blood sugar is low, which includes:   3-4 GLUCOSE TABS  OR  3-4 OZ OF JUICE OR REGULAR SODA OR  ONE TUBE OF GLUCOSE GEL     STEP 2: RECHECK blood sugar in 15 MINUTES STEP 3: If your blood sugar is still low at the 15 minute recheck --> then, go back to STEP 1 and treat AGAIN with another 15 grams of carbohydrates.

## 2020-04-27 ENCOUNTER — Other Ambulatory Visit: Payer: Self-pay | Admitting: Family Medicine

## 2020-04-29 ENCOUNTER — Telehealth: Payer: Self-pay | Admitting: Family Medicine

## 2020-04-29 ENCOUNTER — Telehealth: Payer: Self-pay | Admitting: Internal Medicine

## 2020-04-29 NOTE — Telephone Encounter (Signed)
Patient's wife called stating she normally comes with the patient to his appointments, but when he came the other day she was out of town and could not come but she asked if nurse/Dr could give her a call to discuss the patient's medications. She said she is concerned with the number of pills he is taking in a day. Ph# [phone]

## 2020-04-29 NOTE — Telephone Encounter (Signed)
Spoken to patient's wife (Donna) on DPR, she stated that she was wondering if possible for patient to take less pills. She stated that patient have been more forgetful recently. He does not check his sugar and she had call to remind him to take his medications he still forget. She is in [place] and taking care of her mother, right now so it is tough. Please advise. 

## 2020-04-29 NOTE — Telephone Encounter (Signed)
Patients wife would like for Dr. Claris Che nurse to contact her to discuss the medications that the patient is taking.  Please advise

## 2020-04-29 NOTE — Telephone Encounter (Signed)
Notified patient's wife of Dr Harvel Ricks comments. Verbalized understanding.

## 2020-04-30 MED ORDER — BLOOD GLUCOSE METER KIT: PACK | 1 refills | Status: DC

## 2020-04-30 NOTE — Telephone Encounter (Signed)
Patient's wife called and she says the pharmacy told her to call the office because the Aviva meter is no longer being made, so a new meter and testing supplies prescription will need to be sent to the pharmacy. I advised this will be taken care of today.

## 2020-04-30 NOTE — Addendum Note (Signed)
Addended by: Wilford Corner on: 04/30/2020 09:45 AM   Modules accepted: Orders

## 2020-06-01 ENCOUNTER — Other Ambulatory Visit: Payer: Self-pay | Admitting: Family Medicine

## 2020-06-10 ENCOUNTER — Telehealth: Payer: Self-pay | Admitting: Family Medicine

## 2020-06-10 ENCOUNTER — Other Ambulatory Visit: Payer: Self-pay | Admitting: Family Medicine

## 2020-06-10 MED ORDER — SULFAMETHOXAZOLE-TRIMETHOPRIM 800-160 MG PO TABS
1.0000 | ORAL_TABLET | Freq: Two times a day (BID) | ORAL | 2 refills | Status: DC | PRN
Start: 2020-06-10 — End: 2020-08-15

## 2020-06-10 NOTE — Telephone Encounter (Signed)
Done

## 2020-06-10 NOTE — Telephone Encounter (Signed)
Pt call and need a refill on sulfamethoxazole-trimethoprim (BACTRIM DS,SEPTRA DS) 800-160 MG tablet sent to  [id number] [place], [place] - [address] Phone:  [phone]  Fax:  [phone]      v

## 2020-07-22 ENCOUNTER — Ambulatory Visit: Payer: Self-pay

## 2020-07-26 ENCOUNTER — Other Ambulatory Visit: Payer: Self-pay | Admitting: Internal Medicine

## 2020-07-31 ENCOUNTER — Other Ambulatory Visit: Payer: Self-pay

## 2020-07-31 ENCOUNTER — Encounter: Payer: Self-pay | Admitting: Family Medicine

## 2020-07-31 ENCOUNTER — Ambulatory Visit (INDEPENDENT_AMBULATORY_CARE_PROVIDER_SITE_OTHER): Payer: Medicare Other | Admitting: Family Medicine

## 2020-07-31 VITALS — BP 158/70 | HR 54 | Temp 97.8°F | Ht 69.0 in | Wt 208.4 lb

## 2020-07-31 DIAGNOSIS — E119 Type 2 diabetes mellitus without complications: Secondary | ICD-10-CM | POA: Diagnosis not present

## 2020-07-31 DIAGNOSIS — R197 Diarrhea, unspecified: Secondary | ICD-10-CM

## 2020-07-31 NOTE — Progress Notes (Signed)
   Subjective:    Patient ID: [name], male    DOB: [birth year], 70 y.o.   MRN: [id number]  HPI Here for abdominal cramps, diarrhea, and passing gas. He last saw Dr. Lonzo Cloud in October and his A1c then was 7.4. He had been taking Actos, Metformin, and [place]. She added Glipizide 5 mg each morning. Now for the past 8 weeks he has had these GI issues, and he blames the Glipizide for it. No fever, no blood in the stools, no weight changes. Then yesterday he started taking Align, a probiotic, and he has taken this for the past 2 days. The cramps and gas and diarrhea have all stopped. Today he feels totally fine.    Review of Systems  Constitutional: Negative.   Respiratory: Negative.   Cardiovascular: Negative.   Gastrointestinal: Positive for abdominal pain and diarrhea. Negative for abdominal distention, anal bleeding, blood in stool, constipation, nausea, rectal pain and vomiting.  Genitourinary: Negative.        Objective:   Physical Exam Constitutional:      Appearance: Normal appearance. He is well-developed.  Cardiovascular:     Rate and Rhythm: Normal rate and regular rhythm.     Pulses: Normal pulses.     Heart sounds: Normal heart sounds.  Pulmonary:     Effort: Pulmonary effort is normal.     Breath sounds: Normal breath sounds.  Abdominal:     General: Abdomen is flat. Bowel sounds are normal. There is no distension.     Palpations: There is no mass.     Tenderness: There is no abdominal tenderness. There is no guarding or rebound.     Hernia: No hernia is present.  Neurological:     Mental Status: He is alert.           Assessment & Plan:  He has had GI issues which are likely side effects from medications. He seems to have responded to the probiotic, so he will continue to take this. As long as he does okay he will stay on the Glipizide, but we agreed that if the diarrhea returns he will stop the Glipizide. Recheck as needed.  Gershon Crane, MD

## 2020-08-03 ENCOUNTER — Other Ambulatory Visit: Payer: Self-pay | Admitting: Family Medicine

## 2020-08-06 NOTE — Progress Notes (Signed)
Subjective:   [name] is a 70 y.o. male who presents for an Initial Medicare Annual Wellness Visit.  Virtual Visit via Video Note  I connected with [name] by a video enabled telemedicine application and verified that I am speaking with the correct person using two identifiers.  Location: Patient: Home Provider: Office Persons participating in the virtual visit: patient, provider   I discussed the limitations of evaluation and management by telemedicine and the availability of in person appointments. The patient expressed understanding and agreed to proceed.     Larene Beach Kewon Statler,LPN   Review of Systems    N/A  Cardiac Risk Factors include: advanced age (>34mn, >>26women);male gender;diabetes mellitus;hypertension;dyslipidemia     Objective:    Today's Vitals   There is no height or weight on file to calculate BMI.  Advanced Directives 08/07/2020 02/18/2018 06/17/2016  Does Patient Have a Medical Advance Directive? No No No  Would patient like information on creating a medical advance directive? - Yes (MAU/Ambulatory/Procedural Areas - Information given) -    Current Medications (verified) Outpatient Encounter Medications as of 08/07/2020  Medication Sig  . ACCU-CHEK GUIDE test strip USE TO TEST BLOOD SUGAR EVERY DAY  . amLODipine (NORVASC) 5 MG tablet Take 1 tablet (5 mg total) by mouth daily.  .Marland Kitchenaspirin 81 MG tablet Take 81 mg by mouth daily.  . blood glucose meter kit and supplies Dispense based on patient and insurance preference. . (FOR ICD-10 E10.9, E11.9). Check blood sugar daily  . Calcium Carbonate-Vitamin D (CALCIUM 600+D PO) Take 1 tablet by mouth daily.  . clonazePAM (KLONOPIN) 0.5 MG tablet TAKE 1 TABLET BY MOUTH TWICE A DAY  . Coenzyme Q10 (COQ10) 100 MG CAPS Take 100 mg by mouth daily.   . dapagliflozin propanediol (FARXIGA) 10 MG TABS tablet Take 10 mg by mouth daily.  .Marland Kitchenezetimibe (ZETIA) 10 MG tablet TAKE 1 TABLET BY MOUTH EVERY DAY  .  fexofenadine (ALLEGRA) 180 MG tablet Take 180 mg by mouth daily as needed for allergies or rhinitis.  .Marland Kitchengabapentin (NEURONTIN) 300 MG capsule TAKE 3 CAPSULES BY MOUTH AT BEDTIME  . glipiZIDE (GLUCOTROL) 5 MG tablet Take 1 tablet (5 mg total) by mouth daily before breakfast.  . Insulin Pen Needle (PEN NEEDLES) 31G X 8 MM MISC Use daily with victoza insulin  . isosorbide mononitrate (IMDUR) 30 MG 24 hr tablet   . Lancets MISC Fast click lancet accu check may substitute for whever pt has  . lisinopril (ZESTRIL) 40 MG tablet TAKE 1 TABLET BY MOUTH EVERY DAY  . metFORMIN (GLUCOPHAGE) 1000 MG tablet TAKE 1 TABLET BY MOUTH TWICE A DAY  . nebivolol (BYSTOLIC) 10 MG tablet Take 0.5 tablets (5 mg total) by mouth daily.  . Omega-3 350 MG CPDR Take 350 mg by mouth daily.  . pioglitazone (ACTOS) 45 MG tablet Take 1 tablet (45 mg total) by mouth daily.  . meloxicam (MOBIC) 15 MG tablet TAKE 1 TABLET BY MOUTH EVERY DAY (Patient not taking: Reported on 08/07/2020)  . nitroGLYCERIN (NITROSTAT) 0.4 MG SL tablet Place 1 tablet (0.4 mg total) under the tongue every 5 (five) minutes as needed for chest pain.  . rosuvastatin (CRESTOR) 10 MG tablet TAKE 1 TABLET BY MOUTH EVERY DAY (Patient not taking: No sig reported)  . sulfamethoxazole-trimethoprim (BACTRIM DS) 800-160 MG tablet Take 1 tablet by mouth 2 (two) times daily as needed (bladder infections). (Patient not taking: No sig reported)   No facility-administered encounter medications on  file as of 08/07/2020.    Allergies (verified) Codeine, Crestor [rosuvastatin], Isosorbide nitrate, Lipitor [atorvastatin], Zolpidem tartrate, Ciprofloxacin, and Victoza [liraglutide]   History: Past Medical History:  Diagnosis Date  . CAD (coronary artery disease)    nuclear stress test-09/09/11 low risk scan EF61%, sees Dr. Claiborne Billings   . Chronic headache   . Chronic neck pain   . Diabetes mellitus   . Hyperlipidemia   . Hypertension 12/26/08   ECHO- EF>55%  . Insomnia     Past Surgical History:  Procedure Laterality Date  . APPENDECTOMY    . cervical spine injection    . COLONOSCOPY  04-02-06    per [facility], clear, repeat in 10 yrs  . CORONARY ARTERY BYPASS GRAFT  2002   4 vessels   Family History  Problem Relation Age of Onset  . Liver cancer Father   . Cancer Brother   . Cancer Brother   . Cancer Brother   . Coronary artery disease Other   . Hypertension Other   . Heart disease Mother   . Colon cancer Neg Hx   . Stomach cancer Neg Hx   . Esophageal cancer Neg Hx    Social History   Socioeconomic History  . Marital status: Married    Spouse name: Not on file  . Number of children: Not on file  . Years of education: Not on file  . Highest education level: Not on file  Occupational History  . Not on file  Tobacco Use  . Smoking status: Former Smoker    Packs/day: 0.25    Years: 10.00    Pack years: 2.50    Types: Cigarettes    Quit date: 11/26/1970    Years since quitting: 49.7  . Smokeless tobacco: Never Used  Vaping Use  . Vaping Use: Never used  Substance and Sexual Activity  . Alcohol use: No    Alcohol/week: 0.0 standard drinks  . Drug use: No  . Sexual activity: Not on file  Other Topics Concern  . Not on file  Social History Narrative  . Not on file   Social Determinants of Health   Financial Resource Strain: Low Risk   . Difficulty of Paying Living Expenses: Not hard at all  Food Insecurity: No Food Insecurity  . Worried About Charity fundraiser in the Last Year: Never true  . Ran Out of Food in the Last Year: Never true  Transportation Needs: No Transportation Needs  . Lack of Transportation (Medical): No  . Lack of Transportation (Non-Medical): No  Physical Activity: Inactive  . Days of Exercise per Week: 0 days  . Minutes of Exercise per Session: 0 min  Stress: No Stress Concern Present  . Feeling of Stress : Not at all  Social Connections: Socially Integrated  . Frequency of Communication with  Friends and Family: More than three times a week  . Frequency of Social Gatherings with Friends and Family: More than three times a week  . Attends Religious Services: More than 4 times per year  . Active Member of Clubs or Organizations: Yes  . Attends Archivist Meetings: More than 4 times per year  . Marital Status: Married    Tobacco Counseling Counseling given: Not Answered   Clinical Intake:  Pre-visit preparation completed: Yes  Pain : No/denies pain     Nutritional Risks: Nausea/ vomitting/ diarrhea (diarrhea) Diabetes: Yes CBG done?: No Did pt. bring in CBG monitor from home?: No  How often  do you need to have someone help you when you read instructions, pamphlets, or other written materials from your doctor or pharmacy?: 1 - Never What is the last grade level you completed in school?: High School  Diabetic?yes Nutrition Risk Assessment:  Has the patient had any N/V/D within the last 2 months?  Yes  Does the patient have any non-healing wounds?  No  Has the patient had any unintentional weight loss or weight gain?  No   Diabetes:  Is the patient diabetic?  Yes  If diabetic, was a CBG obtained today?  No  Did the patient bring in their glucometer from home?  No  How often do you monitor your CBG's? Patient states checks glucose every morning.   Financial Strains and Diabetes Management:  Are you having any financial strains with the device, your supplies or your medication? No .  Does the patient want to be seen by Chronic Care Management for management of their diabetes?  No  Would the patient like to be referred to a Nutritionist or for Diabetic Management?  No   Diabetic Exams:  Diabetic Eye Exam: Overdue for diabetic eye exam. Pt has been advised about the importance in completing this exam. Patient advised to call and schedule an eye exam. Diabetic Foot Exam: Completed 10/25/2019   Interpreter Needed?: No      Activities of Daily  Living In your present state of health, do you have any difficulty performing the following activities: 08/07/2020  Hearing? 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 N  Difficulty concentrating or making decisions? N  Walking or climbing stairs? N  Dressing or bathing? N  Doing errands, shopping? N  Preparing Food and eating ? N  Using the Toilet? N  In the past six months, have you accidently leaked urine? N  Do you have problems with loss of bowel control? N  Managing your Medications? N  Managing your Finances? N  Housekeeping or managing your Housekeeping? N  Some recent data might be hidden    Patient Care Team: Laurey Morale, MD as PCP - General Claiborne Billings Joyice Faster, MD as PCP - Cardiology (Cardiology)  Indicate any recent Medical Services you may have received from other than [facility] providers in the past year (date may be approximate).     Assessment:   This is a routine wellness examination for [name].  Hearing/Vision screen  Hearing Screening   125Hz  250Hz  500Hz  1000Hz  2000Hz  3000Hz  4000Hz  6000Hz  8000Hz   Right ear:           Left ear:           Vision Screening Comments: Patient states gets eye exams once per year  Dietary issues and exercise activities discussed: Current Exercise Habits: The patient has a physically strenuous job, but has no regular exercise apart from work., Exercise limited by: None identified  Goals    . Exercise 150 min/wk Moderate Activity    . Patient Stated     I would like to live for the [facility]!       Depression Screen PHQ 2/9 Scores 08/07/2020 04/11/2018 04/01/2015 11/14/2013  PHQ - 2 Score 0 0 0 3  PHQ- 9 Score - - - 16    Fall Risk Fall Risk  08/07/2020 04/11/2018 04/01/2015 11/14/2013  Falls in the past year? 0 Yes No No  Number falls in past yr: 0 2 or more - -  Injury with Fall? 0 Yes - -  Risk for fall due to : No Fall Risks - - -  Follow up Falls evaluation completed;Falls prevention discussed - - -    FALL RISK  PREVENTION PERTAINING TO THE HOME:  Any stairs in or around the home? Yes  If so, are there any without handrails? No  Home free of loose throw rugs in walkways, pet beds, electrical cords, etc? Yes  Adequate lighting in your home to reduce risk of falls? Yes   ASSISTIVE DEVICES UTILIZED TO PREVENT FALLS:  Life alert? No  Use of a cane, walker or w/c? No  Grab bars in the bathroom? Yes  Shower chair or bench in shower? No  Elevated toilet seat or a handicapped toilet? No    Cognitive Function:   Normal cognitive status assessed by direct observation by this Nurse Health Advisor. No abnormalities found.        Immunizations Immunization History  Administered Date(s) Administered  . Influenza Whole 06/09/2010  . Influenza, High Dose Seasonal PF 04/01/2015, 04/22/2016, 03/03/2017, 04/11/2018  . Influenza,inj,Quad PF,6+ Mos 06/05/2013, 04/25/2014  . Tdap 03/31/2013    TDAP status: Up to date  Flu Vaccine status: Declined, Education has been provided regarding the importance of this vaccine but patient still declined. Advised may receive this vaccine at local pharmacy or Health Dept. Aware to provide a copy of the vaccination record if obtained from local pharmacy or Health Dept. Verbalized acceptance and understanding.  Pneumococcal vaccine status: Declined,  Education has been provided regarding the importance of this vaccine but patient still declined. Advised may receive this vaccine at local pharmacy or Health Dept. Aware to provide a copy of the vaccination record if obtained from local pharmacy or Health Dept. Verbalized acceptance and understanding.   Covid-19 vaccine status: Declined, Education has been provided regarding the importance of this vaccine but patient still declined. Advised may receive this vaccine at local pharmacy or Health Dept.or vaccine clinic. Aware to provide a copy of the vaccination record if obtained from local pharmacy or Health Dept. Verbalized  acceptance and understanding.  Qualifies for Shingles Vaccine? Yes   Zostavax completed No   Shingrix Completed?: No.    Education has been provided regarding the importance of this vaccine. Patient has been advised to call insurance company to determine out of pocket expense if they have not yet received this vaccine. Advised may also receive vaccine at local pharmacy or Health Dept. Verbalized acceptance and understanding.  Screening Tests Health Maintenance  Topic Date Due  . Hepatitis C Screening  Never done  . COVID-19 Vaccine (1) Never done  . PNA vac Low Risk Adult (1 of 2 - PCV13) Never done  . COLONOSCOPY (Pts 45-16yr Insurance coverage will need to be confirmed)  04/02/2016  . OPHTHALMOLOGY EXAM  01/13/2019  . INFLUENZA VACCINE  10/03/2020 (Originally 02/04/2020)  . FOOT EXAM  10/24/2020  . HEMOGLOBIN A1C  10/24/2020  . TETANUS/TDAP  04/01/2023    Health Maintenance  Health Maintenance Due  Topic Date Due  . Hepatitis C Screening  Never done  . COVID-19 Vaccine (1) Never done  . PNA vac Low Risk Adult (1 of 2 - PCV13) Never done  . COLONOSCOPY (Pts 45-492yrInsurance coverage will need to be confirmed)  04/02/2016  . OPHTHALMOLOGY EXAM  01/13/2019    Colorectal Cancer Screening: Patient declined screening   Lung Cancer Screening: (Low Dose CT Chest recommended if Age 71-80ears, 30 pack-year currently smoking OR have quit w/in 15years.) does not qualify.  Lung Cancer Screening Referral: N/A   Additional Screening:  Hepatitis C Screening: does qualify;   Vision Screening: Recommended annual ophthalmology exams for early detection of glaucoma and other disorders of the eye. Is the patient up to date with their annual eye exam?  Yes  Who is the provider or what is the name of the office in which the patient attends annual eye exams? [facility] in [facility] (Dr.Jones) If pt is not established with a provider, would they like to be referred to a provider to  establish care? No .   Dental Screening: Recommended annual dental exams for proper oral hygiene  Community Resource Referral / Chronic Care Management: CRR required this visit?  No   CCM required this visit?  No      Plan:     I have personally reviewed and noted the following in the patient's chart:   . Medical and social history . Use of alcohol, tobacco or illicit drugs  . Current medications and supplements . Functional ability and status . Nutritional status . Physical activity . Advanced directives . List of other physicians . Hospitalizations, surgeries, and ER visits in previous 12 months . Vitals . Screenings to include cognitive, depression, and falls . Referrals and appointments  In addition, I have reviewed and discussed with patient certain preventive protocols, quality metrics, and best practice recommendations. A written personalized care plan for preventive services as well as general preventive health recommendations were provided to patient.     Ofilia Neas, LPN   10/12/6883   Nurse Notes: None

## 2020-08-07 ENCOUNTER — Ambulatory Visit (INDEPENDENT_AMBULATORY_CARE_PROVIDER_SITE_OTHER): Payer: Medicare Other

## 2020-08-07 DIAGNOSIS — Z0000 Encounter for general adult medical examination without abnormal findings: Secondary | ICD-10-CM | POA: Diagnosis not present

## 2020-08-07 NOTE — Patient Instructions (Signed)
[name] , Thank you for taking time to come for your Medicare Wellness Visit. I appreciate your ongoing commitment to your health goals. Please review the following plan we discussed and let me know if I can assist you in the future.   Screening recommendations/referrals: Colonoscopy: Patient declined  Recommended yearly ophthalmology/optometry visit for glaucoma screening and checkup Recommended yearly dental visit for hygiene and checkup  Vaccinations: Influenza vaccine: Patient declined  Pneumococcal vaccine: Patient declined  Tdap vaccine: up to date, next due 03/30/2023 Shingles vaccine: Patient declined     Advanced directives: Advance directive discussed with you today. Even though you declined this today please call our office should you change your mind and we can give you the proper paperwork for you to fill out.   Conditions/risks identified: None   Next appointment: None  Preventive Care 65 Years and Older, Male Preventive care refers to lifestyle choices and visits with your health care provider that can promote health and wellness. What does preventive care include?  A yearly physical exam. This is also called an annual well check.  Dental exams once or twice a year.  Routine eye exams. Ask your health care provider how often you should have your eyes checked.  Personal lifestyle choices, including:  Daily care of your teeth and gums.  Regular physical activity.  Eating a healthy diet.  Avoiding tobacco and drug use.  Limiting alcohol use.  Practicing safe sex.  Taking low doses of aspirin every day.  Taking vitamin and mineral supplements as recommended by your health care provider. What happens during an annual well check? The services and screenings done by your health care provider during your annual well check will depend on your age, overall health, lifestyle risk factors, and family history of disease. Counseling  Your health care provider  may ask you questions about your:  Alcohol use.  Tobacco use.  Drug use.  Emotional well-being.  Home and relationship well-being.  Sexual activity.  Eating habits.  History of falls.  Memory and ability to understand (cognition).  Work and work Astronomer. Screening  You may have the following tests or measurements:  Height, weight, and BMI.  Blood pressure.  Lipid and cholesterol levels. These may be checked every 5 years, or more frequently if you are over 87 years old.  Skin check.  Lung cancer screening. You may have this screening every year starting at age 62 if you have a 30-pack-year history of smoking and currently smoke or have quit within the past 15 years.  Fecal occult blood test (FOBT) of the stool. You may have this test every year starting at age 50.  Flexible sigmoidoscopy or colonoscopy. You may have a sigmoidoscopy every 5 years or a colonoscopy every 10 years starting at age 102.  Prostate cancer screening. Recommendations will vary depending on your family history and other risks.  Hepatitis C blood test.  Hepatitis B blood test.  Sexually transmitted disease (STD) testing.  Diabetes screening. This is done by checking your blood sugar (glucose) after you have not eaten for a while (fasting). You may have this done every 1-3 years.  Abdominal aortic aneurysm (AAA) screening. You may need this if you are a current or former smoker.  Osteoporosis. You may be screened starting at age 45 if you are at high risk. Talk with your health care provider about your test results, treatment options, and if necessary, the need for more tests. Vaccines  Your health care provider may recommend  certain vaccines, such as:  Influenza vaccine. This is recommended every year.  Tetanus, diphtheria, and acellular pertussis (Tdap, Td) vaccine. You may need a Td booster every 10 years.  Zoster vaccine. You may need this after age 9.  Pneumococcal 13-valent  conjugate (PCV13) vaccine. One dose is recommended after age 81.  Pneumococcal polysaccharide (PPSV23) vaccine. One dose is recommended after age 45. Talk to your health care provider about which screenings and vaccines you need and how often you need them. This information is not intended to replace advice given to you by your health care provider. Make sure you discuss any questions you have with your health care provider. Document Released: 07/19/2015 Document Revised: 03/11/2016 Document Reviewed: 04/23/2015 Elsevier Interactive Patient Education  2017 [employer] Prevention in the Home Falls can cause injuries. They can happen to people of all ages. There are many things you can do to make your home safe and to help prevent falls. What can I do on the outside of my home?  Regularly fix the edges of walkways and driveways and fix any cracks.  Remove anything that might make you trip as you walk through a door, such as a raised step or threshold.  Trim any bushes or trees on the path to your home.  Use bright outdoor lighting.  Clear any walking paths of anything that might make someone trip, such as rocks or tools.  Regularly check to see if handrails are loose or broken. Make sure that both sides of any steps have handrails.  Any raised decks and porches should have guardrails on the edges.  Have any leaves, snow, or ice cleared regularly.  Use sand or salt on walking paths during winter.  Clean up any spills in your garage right away. This includes oil or grease spills. What can I do in the bathroom?  Use night lights.  Install grab bars by the toilet and in the tub and shower. Do not use towel bars as grab bars.  Use non-skid mats or decals in the tub or shower.  If you need to sit down in the shower, use a plastic, non-slip stool.  Keep the floor dry. Clean up any water that spills on the floor as soon as it happens.  Remove soap buildup in the tub or  shower regularly.  Attach bath mats securely with double-sided non-slip rug tape.  Do not have throw rugs and other things on the floor that can make you trip. What can I do in the bedroom?  Use night lights.  Make sure that you have a light by your bed that is easy to reach.  Do not use any sheets or blankets that are too big for your bed. They should not hang down onto the floor.  Have a firm chair that has side arms. You can use this for support while you get dressed.  Do not have throw rugs and other things on the floor that can make you trip. What can I do in the kitchen?  Clean up any spills right away.  Avoid walking on wet floors.  Keep items that you use a lot in easy-to-reach places.  If you need to reach something above you, use a strong step stool that has a grab bar.  Keep electrical cords out of the way.  Do not use floor polish or wax that makes floors slippery. If you must use wax, use non-skid floor wax.  Do not have throw rugs and other  things on the floor that can make you trip. What can I do with my stairs?  Do not leave any items on the stairs.  Make sure that there are handrails on both sides of the stairs and use them. Fix handrails that are broken or loose. Make sure that handrails are as long as the stairways.  Check any carpeting to make sure that it is firmly attached to the stairs. Fix any carpet that is loose or worn.  Avoid having throw rugs at the top or bottom of the stairs. If you do have throw rugs, attach them to the floor with carpet tape.  Make sure that you have a light switch at the top of the stairs and the bottom of the stairs. If you do not have them, ask someone to add them for you. What else can I do to help prevent falls?  Wear shoes that:  Do not have high heels.  Have rubber bottoms.  Are comfortable and fit you well.  Are closed at the toe. Do not wear sandals.  If you use a stepladder:  Make sure that it is fully  opened. Do not climb a closed stepladder.  Make sure that both sides of the stepladder are locked into place.  Ask someone to hold it for you, if possible.  Clearly mark and make sure that you can see:  Any grab bars or handrails.  First and last steps.  Where the edge of each step is.  Use tools that help you move around (mobility aids) if they are needed. These include:  Canes.  Walkers.  Scooters.  Crutches.  Turn on the lights when you go into a dark area. Replace any light bulbs as soon as they burn out.  Set up your furniture so you have a clear path. Avoid moving your furniture around.  If any of your floors are uneven, fix them.  If there are any pets around you, be aware of where they are.  Review your medicines with your doctor. Some medicines can make you feel dizzy. This can increase your chance of falling. Ask your doctor what other things that you can do to help prevent falls. This information is not intended to replace advice given to you by your health care provider. Make sure you discuss any questions you have with your health care provider. Document Released: 04/18/2009 Document Revised: 11/28/2015 Document Reviewed: 07/27/2014 Elsevier Interactive Patient Education  2017 [employer].

## 2020-08-15 ENCOUNTER — Other Ambulatory Visit: Payer: Self-pay

## 2020-08-15 ENCOUNTER — Encounter: Payer: Self-pay | Admitting: Cardiovascular Disease

## 2020-08-15 ENCOUNTER — Ambulatory Visit (INDEPENDENT_AMBULATORY_CARE_PROVIDER_SITE_OTHER): Payer: Medicare Other | Admitting: Cardiovascular Disease

## 2020-08-15 VITALS — BP 150/71 | HR 57 | Ht 69.0 in | Wt 210.0 lb

## 2020-08-15 DIAGNOSIS — E785 Hyperlipidemia, unspecified: Secondary | ICD-10-CM | POA: Diagnosis not present

## 2020-08-15 DIAGNOSIS — Z951 Presence of aortocoronary bypass graft: Secondary | ICD-10-CM

## 2020-08-15 DIAGNOSIS — I10 Essential (primary) hypertension: Secondary | ICD-10-CM | POA: Diagnosis not present

## 2020-08-15 DIAGNOSIS — E118 Type 2 diabetes mellitus with unspecified complications: Secondary | ICD-10-CM

## 2020-08-15 DIAGNOSIS — I25810 Atherosclerosis of coronary artery bypass graft(s) without angina pectoris: Secondary | ICD-10-CM | POA: Diagnosis not present

## 2020-08-15 MED ORDER — AMLODIPINE BESYLATE 5 MG PO TABS: 7.5000 mg | ORAL_TABLET | Freq: Every day | ORAL | 3 refills | Status: DC

## 2020-08-15 NOTE — Patient Instructions (Signed)
Medication Instructions:  INCREASE amlodipine to 7.5 mg daily  *If you need a refill on your cardiac medications before your next appointment, please call your pharmacy*   Lab Work: CMET, CBC, Lipids, TSH, HmgA1C  If you have labs (blood work) drawn today and your tests are completely normal, you will receive your results only by: Marland Kitchen MyChart Message (if you have MyChart) OR . A paper copy in the mail If you have any lab test that is abnormal or we need to change your treatment, we will call you to review the results.  Follow-Up: At [facility], you and your health needs are our priority.  As part of our continuing mission to provide you with exceptional heart care, we have created designated Provider Care Teams.  These Care Teams include your primary Cardiologist (physician) and Advanced Practice Providers (APPs -  Physician Assistants and Nurse Practitioners) who all work together to provide you with the care you need, when you need it.  We recommend signing up for the patient portal called "MyChart".  Sign up information is provided on this After Visit Summary.  MyChart is used to connect with patients for Virtual Visits (Telemedicine).  Patients are able to view lab/test results, encounter notes, upcoming appointments, etc.  Non-urgent messages can be sent to your provider as well.   To learn more about what you can do with MyChart, go to [email or web address].    Your next appointment:   6 month(s)  The format for your next appointment:   In Person  Provider:   Nicki Guadalajara, MD

## 2020-08-15 NOTE — Progress Notes (Signed)
Patient ID: [name], male   DOB: Jul 17, 1950, 70 y.o.   MRN: [id number]      Primary M.D.: Dr. Alysia Penna  HPI: [name] is a 70 y.o. male presents to the office today for a 6 month  follow up cardiology evaluation.   [name] has CAD and in June 2002 underwent CABG surgery by Dr. Roxan Hockey with a sequential LIMA graft to the second diagonal and LAD, and sequential radial graft to the first and third diagonal branches. An echo Doppler study in 2010 showed normal systolic function with mild MR and trace TR.  A  nuclear perfusion study in March 2013 showed normal perfusion and function without scar or ischemia.  Additional problems include hypertension, type 2 diabetes mellitus, and hyperlipidemia. Remotely, he had worked as a Garment/textile technologist minor and in the past was some question of possible black lung. 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  In November 2015 he complained of a slight change in symptomatology with exertional shortness of breath and some arm weakness.  Prior to bypass surgery, he had never experienced chest pain but demonstrated exertional shortness of breath and arm discomfort.  He admitted to fatigue.  He denied any major episodes of chest pressure.  A 2-D echo Doppler study on 06/14/2014 showed an ejection fraction of 55-60% with mild focal basal hypertrophy of the septum; grade 1 diastolic dysfunction.  There was evidence for mild mitral regurgitation and moderate left atrial dilatation.  A nuclear perfusion study on December 11,2015 was interpreted as low risk.  He developed 1-2 mm of horizontal ST segment depression and had normal scintigraphic myocardial perfusion images.  Ejection fraction was 57%.  There was normal LV function without wall motion abnormality.  He has a history of hyperlipidemia and remotely self increased his simvastatin from 40 mg to 80 mg and developed occasional myalgias.   He has been on Bystolic 10 mg and lisinopril 30 mg for his hypertension.  He is on metformin 1000 mg twice a day as well as DiaBeta 5 mg for his type 2 diabetes mellitus.  When I saw him in October 2018, he had stopped taking Livalo for approximately one month.  He had been busy doing home projects and denied any recurrent chest pain.  During his last evaluation.  I had a lengthy discussion with him and recommended another attempt at a trial of Livalo.  I also added supplemental coenzyme Q 10.  Over this time period, he was able to reinitiate 2 mg daily and has been able to tolerate this without myalgias.  Subsequent blood work did show improvement in his lipid status on 08/04/2017 with a total cholesterol 149, LDL 82.  Triglycerides 148, and HDL 37.  He has continued to be on Januvia and metformin for his diabetes mellitus.  Hypertension.  He has remained on amlodipine 5 mg, lisinopril 30 mg, and Bystolic 10 mg.   When evaluated in March 2019 he was tolerating Livalo and I recommended further titration to 4 mg daily and attempt to obtain an LDL less than 70.  Since I saw him, he has had issues with poorly controlled diabetes mellitus.  Apparently he was started on farziga but developed some dizziness secondary to this.  He is now on metformin, Januvia, and was started on Actos.  Earlier this year he had a fall off a ladder.   I saw him in October 2019.  He required  rotator cuff surgery on his right shoulder.  His surgery was delayed due to initial glucose issues.  Since it is been over 17 years since his CABG revascularization, I recommended that he undergo a preoperative exercise Myoview study for preoperative assessment and clearance.  He underwent a nuclear perfusion study on May 05, 2018.  This was interpreted as low risk with normal perfusion and mildly reduced left ventricular global systolic function with an EF of 48%.  He developed horizontal ST segment depression of 1.5 mm during stress which  returned to baseline at 5 to 9 minutes into the recovery period.  When compared to his prior nuclear perfusion study of 2015 this was not significantly changed and on that study he also had normal perfusion with positive ECG changes.  A subsequent echo Doppler study on May 30, 2018 showed an EF of 55 to 60%.  There was severe LVH.  There was grade 1 diastolic dysfunction with mild MR and mild LA dilation.  He denies any chest pain.  He has had periods of increased cough following prolonged coughs he may feel somewhat atypical chest pain discomfort.  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 Lake Bells.  He had his pulmonary evaluation with Dr. 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  I saw him in March 2021.  Over the prior year he had remained stable from a cardiovascular standpoint.  He was not having anginal symptomatology 18-1/2 years following his CABG revascularization.  He developed Covid infection and was very weak and fatigued for 5 days.  In addition his son died in 18-Dec-2018 after he developed a seizure while driving a motorcycle and was around 71 years old.  In addition, his sister died at age 32 secondary to cancer.  He continues to be on amlodipine 5 mg, isosorbide 30 mg, lisinopril 40 mg and Bystolic 10 mg for hypertension.  He is on Zetia and rosuvastatin 10 mg for hyperlipidemia in addition to omega-3 fatty acids.  He is diabetic on Metformin, Farxiga, and Actos.    He was last evaluated by me in August 2021.  [name] has  done fairly well.  However, he admits to experiencing some episodes of chest pressure over the past 6 to 8 weeks.  Mostly this occurs without exertion and oftentimes occurs when he goes more in a reclining position when on his recliner chair.  However, he has noticed some vague symptomatology with walking.  However he walks his dogs regularly and often runs with them and denies any chest pressure when he is running with his dogs.  He does note some mild lightheadedness when he bends over and its up abruptly.  He is unaware of palpitations.  He underwent an echo Doppler study in February 05, 2020 which showed an EF of 55 to 60% and grade 2 diastolic dysfunction.  There was mild mitral gravitation, and mild aortic valve sclerosis.  The ascending aorta measured 38 mm.  Laboratory in April 2021 showed an LDL of 71 on Zetia and rosuvastatin.  He continues to  be on Metformin, Farxiga, and Actos for his diabetes mellitus.    He underwent a nuclear perfusion study for evaluation of his chest pain which was done on April 05, 2020.  This was low risk and showed an EF at 55%.  It was very mild ST depression at peak infusion without any definitive perfusion defect on nuclear imaging.  Presently, he feels well.  He denies any recurrent chest pain.  Continues to be on amlodipine 5 mg, lisinopril 40 mg, isosorbide 30 mg, and Bystolic 5 mg for his hypertension and CAD.  He is diabetic on Farxiga, and glipizide in addition to Actos and recently he stopped taking Metformin.  He has felt improved off Metformin treatment.  He continues to be on Zetia for hyperlipidemia.  He has not had recent laboratory.  He presents for reevaluation.  Past Medical History:  Diagnosis Date  . CAD (coronary artery disease)    nuclear stress test-09/09/11 low risk scan EF61%, sees Dr. Claiborne Billings   . Chronic headache   . Chronic neck pain   . Diabetes mellitus   . Hyperlipidemia   . Hypertension 12/26/08   ECHO- EF>55%  . Insomnia     Past  Surgical History:  Procedure Laterality Date  . APPENDECTOMY    . cervical spine injection    . COLONOSCOPY  04-02-06    per [facility], clear, repeat in 10 yrs  . CORONARY ARTERY BYPASS GRAFT  2002   4 vessels    Allergies  Allergen Reactions  . Codeine Anaphylaxis    Pt. verbalized that he has taken and can take Hydrocodone, Oxycodone w/o issue - codeine allergy only  . Crestor [Rosuvastatin]     Myalgia   . Isosorbide Nitrate Other (See Comments)    Headaches  . Lipitor [Atorvastatin]     myalgia  . Zolpidem Tartrate     REACTION: HA's  . Ciprofloxacin Hives and Rash  . Victoza [Liraglutide] Rash    Current Outpatient Medications  Medication Sig Dispense Refill  . ACCU-CHEK GUIDE test strip USE TO TEST BLOOD SUGAR EVERY DAY 100 strip 1  . amLODipine (NORVASC) 5 MG tablet Take 1.5 tablets (7.5 mg total) by mouth daily. 135 tablet 3  . aspirin 81 MG tablet Take 81 mg by mouth daily.    . blood glucose meter kit and supplies Dispense based on patient and insurance preference. . (FOR ICD-10 E10.9, E11.9). Check blood sugar daily 1 each 1  . Calcium Carbonate-Vitamin D (CALCIUM 600+D PO) Take 1 tablet by mouth daily.    . clonazePAM (KLONOPIN) 0.5 MG tablet TAKE 1 TABLET BY MOUTH TWICE A DAY 60 tablet 5  . Coenzyme Q10 (COQ10) 100 MG CAPS Take 100 mg by mouth daily.     . dapagliflozin propanediol (FARXIGA) 10 MG TABS tablet Take 10 mg by mouth daily. 30 tablet 11  . ezetimibe (ZETIA) 10 MG tablet TAKE 1 TABLET BY MOUTH EVERY DAY 90 tablet 1  . fexofenadine (ALLEGRA) 180 MG tablet Take 180 mg by mouth daily as needed for allergies or rhinitis.    Marland Kitchen gabapentin (NEURONTIN) 300 MG capsule TAKE 3 CAPSULES BY MOUTH AT BEDTIME 270 capsule 0  . glipiZIDE (GLUCOTROL) 5 MG tablet Take 1 tablet (5 mg total) by mouth daily before breakfast. 90 tablet 3  . isosorbide mononitrate (IMDUR) 30 MG 24 hr tablet     . Lancets MISC Fast click lancet accu check may substitute for whever pt has 100  each 3  .  lisinopril (ZESTRIL) 40 MG tablet TAKE 1 TABLET BY MOUTH EVERY DAY 90 tablet 3  . nebivolol (BYSTOLIC) 10 MG tablet Take 0.5 tablets (5 mg total) by mouth daily. 90 tablet 1  . nitroGLYCERIN (NITROSTAT) 0.4 MG SL tablet Place 1 tablet (0.4 mg total) under the tongue every 5 (five) minutes as needed for chest pain. 25 tablet 6  . Omega-3 350 MG CPDR Take 350 mg by mouth daily.    . pioglitazone (ACTOS) 45 MG tablet Take 1 tablet (45 mg total) by mouth daily. 90 tablet 3   No current facility-administered medications for this visit.    Social History   Socioeconomic History  . Marital status: Married    Spouse name: Not on file  . Number of children: Not on file  . Years of education: Not on file  . Highest education level: Not on file  Occupational History  . Not on file  Tobacco Use  . Smoking status: Former Smoker    Packs/day: 0.25    Years: 10.00    Pack years: 2.50    Types: Cigarettes    Quit date: 11/26/1970    Years since quitting: 49.7  . Smokeless tobacco: Never Used  Vaping Use  . Vaping Use: Never used  Substance and Sexual Activity  . Alcohol use: No    Alcohol/week: 0.0 standard drinks  . Drug use: No  . Sexual activity: Not on file  Other Topics Concern  . Not on file  Social History Narrative  . Not on file   Social Determinants of Health   Financial Resource Strain: Low Risk   . Difficulty of Paying Living Expenses: Not hard at all  Food Insecurity: No Food Insecurity  . Worried About Charity fundraiser in the Last Year: Never true  . Ran Out of Food in the Last Year: Never true  Transportation Needs: No Transportation Needs  . Lack of Transportation (Medical): No  . Lack of Transportation (Non-Medical): No  Physical Activity: Inactive  . Days of Exercise per Week: 0 days  . Minutes of Exercise per Session: 0 min  Stress: No Stress Concern Present  . Feeling of Stress : Not at all  Social Connections: Socially Integrated  . Frequency  of Communication with Friends and Family: More than three times a week  . Frequency of Social Gatherings with Friends and Family: More than three times a week  . Attends Religious Services: More than 4 times per year  . Active Member of Clubs or Organizations: Yes  . Attends Archivist Meetings: More than 4 times per year  . Marital Status: Married  Human resources officer Violence: Not At Risk  . Fear of Current or Ex-Partner: No  . Emotionally Abused: No  . Physically Abused: No  . Sexually Abused: No    Family History  Problem Relation Age of Onset  . Liver cancer Father   . Cancer Brother   . Cancer Brother   . Cancer Brother   . Coronary artery disease Other   . Hypertension Other   . Heart disease Mother   . Colon cancer Neg Hx   . Stomach cancer Neg Hx   . Esophageal cancer Neg Hx    Socially he is married. He has 3 children 8 grandchildren and 4 great-grandchildren. There is no tobacco or alcohol use. He is now retired.   ROS General: Negative; No fevers, chills, or night sweats;  HEENT: Decrease in hearing ,No changes in vision  sinus congestion, difficulty swallowing Pulmonary: History of "black lung" Cardiovascular: See history of present illness GI: Negative; No nausea, vomiting, diarrhea, or abdominal pain GU: Negative; No dysuria, hematuria, or difficulty voiding Musculoskeletal: Negative; no myalgias, joint pain, or weakness Hematologic/Oncology: Negative; no easy bruising, bleeding Endocrine: Positive for diabetes mellitus Neuro: Positive for paresthesias in his legs. Skin: Negative; No rashes or skin lesions Psychiatric: Negative; No behavioral problems, depression Sleep: Positive for restless legs; No snoring, daytime sleepiness, hypersomnolence, bruxism, hypnogognic hallucinations, no cataplexy Other comprehensive 14 point system review is negative.   PE BP (!) 150/71   Pulse (!) 57   Ht 5' 9"  (1.753 m)   Wt 210 lb (95.3 kg)   SpO2 98%   BMI  31.01 kg/m   Repeat blood pressure by me was 144/76.  Wt Readings from Last 3 Encounters:  08/15/20 210 lb (95.3 kg)  07/31/20 208 lb 6.4 oz (94.5 kg)  04/25/20 209 lb (94.8 kg)   General: Alert, oriented, no distress.  Skin: normal turgor, no rashes, warm and dry HEENT: Normocephalic, atraumatic. Pupils equal round and reactive to light; sclera anicteric; extraocular muscles intact;  Nose without nasal septal hypertrophy Mouth/Parynx benign; Mallinpatti scale 3 Neck: No JVD, no carotid bruits; normal carotid upstroke Lungs: clear to ausculatation and percussion; no wheezing or rales Chest wall: without tenderness to palpitation Heart: PMI not displaced, RRR, s1 s2 normal, 1/6 systolic murmur, no diastolic murmur, no rubs, gallops, thrills, or heaves Abdomen: soft, nontender; no hepatosplenomehaly, BS+; abdominal aorta nontender and not dilated by palpation. Back: no CVA tenderness Pulses 2+ Musculoskeletal: full range of motion, normal strength, no joint deformities Extremities: no clubbing cyanosis or edema, Homan's sign negative  Neurologic: grossly nonfocal; Cranial nerves grossly wnl Psychologic: Normal mood and affect  ECG (independently read by me): Sinus Bradycardia at 57; early transition  August 2021 ECG (independently read by me): Sinus bradycardia 57 bpm.  Early transition.  No ectopy.  No ST segment changes.  March 2021 ECG (independently read by me): Sinus bradycardia 59 bpm.  Nonspecific ST abnormality.  Normal intervals.  No ectopy.  December 2019 ECG (independently read by me): Normal sinus rhythm at 61 bpm.  Early transition.  No ectopy.  No ST segment changes.  October 2019 ECG (independently read by me): Sinus bradycardia 53 bpm.  Early transition.  No ectopy.  March 2019 ECG (independently read by me): Sinus bradycardia 59 bpm.  Early transition.  Normal intervals.  October 2018 ECG (independently read by me): Normal sinus rhythm at 60 bpm.  Nonspecific T  wave abnormality.  Normal intervals.  No ectopy.  May 2018 ECG (independently read by me): Sinus bradycardia 59 bpm.  No significant ST-T changes.  Early transition.  April 2017 ECG (independently read by me): Sinus bradycardia at 53 bpm.  Q waves in III and F.  March 2016 ECG (independently read by me): Normal sinus rhythm at 67.  No ectopy.  November 2015 ECG (independently read by me): Normal sinus rhythm at 65 bpm.  Early transition.  No significant ST segment changes.  Normal intervals.  Prior ECG: Normal sinus rhythm at 58 beats per minute. QTc interval 428 ms. PR interval 170 ms. Nonspecific ST changes   LABS:  BMP Latest Ref Rng & Units 08/15/2020 10/25/2019 12/22/2018  Glucose 65 - 99 mg/dL 134(H) 120(H) 139(H)  BUN 8 - 27 mg/dL 13 13 14   Creatinine 0.76 - 1.27 mg/dL 0.90 0.82 0.90  BUN/Creat Ratio 10 - 24 14 16  16  Sodium 134 - 144 mmol/L 144 144 139  Potassium 3.5 - 5.2 mmol/L 4.3 4.8 4.8  Chloride 96 - 106 mmol/L 105 105 101  CO2 20 - 29 mmol/L 22 25 25   Calcium 8.6 - 10.2 mg/dL 9.6 9.6 10.1    Hepatic Function Latest Ref Rng & Units 08/15/2020 10/25/2019 12/22/2018  Total Protein 6.0 - 8.5 g/dL 7.3 6.8 7.0  Albumin 3.8 - 4.8 g/dL 4.6 4.2 4.6  AST 0 - 40 IU/L 31 38 21  ALT 0 - 44 IU/L 29 30 24   Alk Phosphatase 44 - 121 IU/L 92 72 84  Total Bilirubin 0.0 - 1.2 mg/dL 0.2 0.3 0.3  Bilirubin, Direct 0.0 - 0.3 mg/dL - - -    CBC Latest Ref Rng & Units 08/15/2020 10/25/2019 02/18/2018  WBC 3.4 - 10.8 x10E3/uL 5.7 6.1 6.8  Hemoglobin 13.0 - 17.7 g/dL 13.4 13.8 13.9  Hematocrit 37.5 - 51.0 % 41.6 42.6 41.5  Platelets 150 - 450 x10E3/uL 256 200 166   Lab Results  Component Value Date   MCV 84 08/15/2020   MCV 89 10/25/2019   MCV 89.1 02/18/2018    Lab Results  Component Value Date   TSH 3.740 08/15/2020   Lab Results  Component Value Date   HGBA1C 7.4 (H) 08/15/2020    BNP No results found for: PROBNP  Lipid Panel     Component Value Date/Time   CHOL 193  08/15/2020 0844   CHOL 173 05/24/2013 1049   TRIG 115 08/15/2020 0844   TRIG 185 (H) 05/24/2013 1049   HDL 47 08/15/2020 0844   HDL 39 (L) 05/24/2013 1049   CHOLHDL 4.1 08/15/2020 0844   CHOLHDL 3.4 11/22/2017 0846   VLDL 29.6 08/04/2017 1101   LDLCALC 125 (H) 08/15/2020 0844   LDLCALC 84 11/22/2017 0846   LDLCALC 97 05/24/2013 1049     RADIOLOGY: No results found.  LEXISCAN MYOVIEW 04/05/2020 Study Highlights    The left ventricular ejection fraction is normal (55-65%).  Nuclear stress EF: 55%.  Horizontal ST segment depression ST segment depression was noted during stress.  No T wave inversion was noted during stress.  The study is normal.  This is a low risk study.   Similar to prior nuclear stress tests, there were very mild st depressions at peak infusion, but there is no clear perfusion defect seen on imaging. Normal EF.   IMPRESSION:  1. Coronary artery disease involving coronary bypass graft of native heart without angina pectoris   2. Hx of CABG   3. Hyperlipidemia LDL goal <70   4. Essential hypertension   5. Type 2 diabetes mellitus with complication, without long-term current use of insulin [facility])    ASSESSMENT AND PLAN: [name] is a 70 year old Caucasian male who underwent CABG revascularization surgery by Dr. Roxan Hockey in November 2002.  Remotely he had noticed mild shortness of breath and fatigue and never experienced classic substernal chest pressure.  A nuclear perfusion study in 2015 continued to show normal perfusion but he developed ST segment changes of 1-2 mm.  Amlodipine was added to his medical regimen and since institution he has felt improved.   A 4-year follow-up nuclear stress test in 2019 in anticipation of potential future shoulder surgery  was unchanged from previously and continued to show normal perfusion but again he developed asymptomatic ST depression which lasted 5 to 9 minutes into the recovery period for resolution.   He never had a shoulder surgery.  At  his last evaluation with me in August 2021 he was experiencing some episodes of chest discomfort.  He underwent follow-up nuclear imaging study on April 05, 2020 which essentially was unchanged from his prior evaluation and remain low risk.  He is now 20 years since CABG revascularization.  His blood pressure today is elevated.  I reviewed with him most recent hypertensive guidelines.  I have recommended slight titration of amlodipine from 5 mg up to 7.5 mg daily and he will continue with his current regimen of isosorbide, lisinopril, and nebivolol.  He is now off Metformin and is on Farxiga in addition to glipizide and Actos for his diabetes mellitus.  He is on Zetia for hyperlipidemia and over-the-counter omega-3 fatty acid.  He has not had recent laboratory.  A complete set of fasting labs will be obtained including chemistry profile, CBC, TSH lipid studies and hemoglobin A1c.  I will contact him regarding the results and adjustments to his regimen will be made if necessary.  Otherwise if he remains stable, I will see him in 6 months for reevaluation or sooner as needed.    Troy Sine, MD, [facility]  08/16/2020 1:36 PM

## 2020-08-16 ENCOUNTER — Encounter: Payer: Self-pay | Admitting: Cardiovascular Disease

## 2020-08-16 LAB — CBC
Hematocrit: 41.6 % (ref 37.5–51.0)
Hemoglobin: 13.4 g/dL (ref 13.0–17.7)
MCH: 27.1 pg (ref 26.6–33.0)
MCHC: 32.2 g/dL (ref 31.5–35.7)
MCV: 84 fL (ref 79–97)
Platelets: 256 10*3/uL (ref 150–450)
RBC: 4.94 x10E6/uL (ref 4.14–5.80)
RDW: 14.4 % (ref 11.6–15.4)
WBC: 5.7 10*3/uL (ref 3.4–10.8)

## 2020-08-16 LAB — COMPREHENSIVE METABOLIC PANEL
ALT: 29 IU/L (ref 0–44)
AST: 31 IU/L (ref 0–40)
Albumin/Globulin Ratio: 1.7 (ref 1.2–2.2)
Albumin: 4.6 g/dL (ref 3.8–4.8)
Alkaline Phosphatase: 92 IU/L (ref 44–121)
BUN/Creatinine Ratio: 14 (ref 10–24)
BUN: 13 mg/dL (ref 8–27)
Bilirubin Total: 0.2 mg/dL (ref 0.0–1.2)
CO2: 22 mmol/L (ref 20–29)
Calcium: 9.6 mg/dL (ref 8.6–10.2)
Chloride: 105 mmol/L (ref 96–106)
Creatinine, Ser: 0.9 mg/dL (ref 0.76–1.27)
GFR calc Af Amer: 100 mL/min/{1.73_m2} (ref >59)
GFR calc non Af Amer: 86 mL/min/{1.73_m2} (ref >59)
Globulin, Total: 2.7 g/dL (ref 1.5–4.5)
Glucose: 134 mg/dL — ABNORMAL HIGH (ref 65–99)
Potassium: 4.3 mmol/L (ref 3.5–5.2)
Sodium: 144 mmol/L (ref 134–144)
Total Protein: 7.3 g/dL (ref 6.0–8.5)

## 2020-08-16 LAB — LIPID PANEL
Chol/HDL Ratio: 4.1 ratio (ref 0.0–5.0)
Cholesterol, Total: 193 mg/dL (ref 100–199)
HDL: 47 mg/dL (ref >39)
LDL Chol Calc (NIH): 125 mg/dL — ABNORMAL HIGH (ref 0–99)
Triglycerides: 115 mg/dL (ref 0–149)
VLDL Cholesterol Cal: 21 mg/dL (ref 5–40)

## 2020-08-16 LAB — TSH: TSH: 3.74 u[IU]/mL (ref 0.450–4.500)

## 2020-08-16 LAB — HEMOGLOBIN A1C
Est. average glucose Bld gHb Est-mCnc: 166 mg/dL
Hgb A1c MFr Bld: 7.4 % — ABNORMAL HIGH (ref 4.8–5.6)

## 2020-09-02 ENCOUNTER — Telehealth: Payer: Self-pay | Admitting: Family Medicine

## 2020-09-02 NOTE — Telephone Encounter (Signed)
Patient needs an updated letter for jury duty stating he can't sit for long periods of time.  He is diabetic and get jittery.

## 2020-09-03 NOTE — Telephone Encounter (Signed)
Left a detailed message for pt advising to pick up the jury duty letter from the the office

## 2020-09-03 NOTE — Telephone Encounter (Signed)
The letter is ready to be picked up  

## 2020-09-04 ENCOUNTER — Other Ambulatory Visit: Payer: Self-pay | Admitting: Internal Medicine

## 2020-09-04 ENCOUNTER — Other Ambulatory Visit: Payer: Self-pay | Admitting: Family Medicine

## 2020-10-02 ENCOUNTER — Other Ambulatory Visit: Payer: Self-pay | Admitting: Internal Medicine

## 2020-10-02 ENCOUNTER — Other Ambulatory Visit: Payer: Self-pay | Admitting: Cardiovascular Disease

## 2020-10-03 ENCOUNTER — Other Ambulatory Visit: Payer: Self-pay

## 2020-10-03 ENCOUNTER — Other Ambulatory Visit: Payer: Self-pay | Admitting: Cardiovascular Disease

## 2020-10-03 MED ORDER — LIVALO 4 MG PO TABS: 4.0000 mg | ORAL_TABLET | Freq: Every day | ORAL | 11 refills | Status: DC

## 2020-10-24 ENCOUNTER — Ambulatory Visit: Payer: Medicare PPO | Admitting: Internal Medicine

## 2020-10-24 ENCOUNTER — Other Ambulatory Visit: Payer: Self-pay | Admitting: Family Medicine

## 2020-11-11 ENCOUNTER — Encounter: Payer: Self-pay | Admitting: Internal Medicine

## 2020-11-11 ENCOUNTER — Other Ambulatory Visit: Payer: Self-pay

## 2020-11-11 ENCOUNTER — Ambulatory Visit (INDEPENDENT_AMBULATORY_CARE_PROVIDER_SITE_OTHER): Payer: Medicare Other | Admitting: Internal Medicine

## 2020-11-11 VITALS — BP 148/72 | HR 56 | Ht 69.0 in | Wt 209.5 lb

## 2020-11-11 DIAGNOSIS — E1159 Type 2 diabetes mellitus with other circulatory complications: Secondary | ICD-10-CM | POA: Diagnosis not present

## 2020-11-11 DIAGNOSIS — E1165 Type 2 diabetes mellitus with hyperglycemia: Secondary | ICD-10-CM

## 2020-11-11 DIAGNOSIS — E1142 Type 2 diabetes mellitus with diabetic polyneuropathy: Secondary | ICD-10-CM

## 2020-11-11 LAB — POCT GLYCOSYLATED HEMOGLOBIN (HGB A1C): Hemoglobin A1C: 8.1 % — AB (ref 4.0–5.6)

## 2020-11-11 LAB — POCT GLUCOSE (DEVICE FOR HOME USE): POC Glucose: 239 mg/dl — AB (ref 70–99)

## 2020-11-11 MED ORDER — GLIPIZIDE 5 MG PO TABS: 5.0000 mg | ORAL_TABLET | Freq: Two times a day (BID) | ORAL | 3 refills | Status: DC

## 2020-11-11 NOTE — Patient Instructions (Addendum)
-   Increase Glipizide 5 mg, 1 tablet before Breakfast and 1 tablet before supper - Continue Actos 45 mg daily  - Continue Farxiga 10 mg once a day      HOW TO TREAT LOW BLOOD SUGARS (Blood sugar LESS THAN 70 MG/DL)  Please follow the RULE OF 15 for the treatment of hypoglycemia treatment (when your (blood sugars are less than 70 mg/dL)    STEP 1: Take 15 grams of carbohydrates when your blood sugar is low, which includes:   3-4 GLUCOSE TABS  OR  3-4 OZ OF JUICE OR REGULAR SODA OR  ONE TUBE OF GLUCOSE GEL     STEP 2: RECHECK blood sugar in 15 MINUTES STEP 3: If your blood sugar is still low at the 15 minute recheck --> then, go back to STEP 1 and treat AGAIN with another 15 grams of carbohydrates.

## 2020-11-11 NOTE — Progress Notes (Signed)
Name: [name]   Age/ Sex: 70 y.o. male   MRN/ DOB: [id number]/ 1951/05/14     PCP: Laurey Morale, MD   Reason for Endocrinology Evaluation: Type 2 Diabetes Mellitus  Initial Endocrine Consultative Visit: 08/31/2018    PATIENT IDENTIFIER: [name] is a 70 y.o. male with a past medical history of T2DM,CAD (S/P CABG). The patient has followed with Endocrinology clinic since 08/31/2018 for consultative assistance with management of his diabetes.  DIABETIC HISTORY:  [name] was diagnosed with T2DM in 2010, he has been on Metformin and Actos for many years, he is intolerant to Victoza due to rash. Januvia has been ineffective , which he stopped 08/2018. He has never been on insulin . His hemoglobin A1c has ranged from 6.1% in 2017, peaking at 10.4% in 2019  On his initial presentation his A1c was 8.5% . He was on Metformin and Actos . We added Farxiga   Glipizide started 04/2020 Self stopped Metformin due to diarrhea 09/2020  SUBJECTIVE:   During the last visit (04/25/2020): A1c 7.4%. Continued Farxiga, Metformin and Actos      Today (11/11/2020): [name] is here with his wife for a  follow up visit on his diabetes.He checks his sugar occasionally. He stopped Metformin due to diarrhea, has felt like a new man since being off of it.   NO nausea or diarrhea since being off Metformin   HOME DIABETES REGIMEN:  Metformin 1000 mg BID - not taking  Actos 45 mg daily  Farxiga 10 mg daily Glipizide 5 mg daily     GLUCOSE LOG : Did not bring     DIABETIC COMPLICATIONS: Microvascular complications:   Neuropathy   Denies: ckd , retinopathy   Last eye exam: Completed 2022    Macrovascular complications:   CAD  Denies: PVD, CVA     HISTORY:  Past Medical History:  Past Medical History:  Diagnosis Date  . CAD (coronary artery disease)    nuclear stress test-09/09/11 low risk scan EF61%, sees Dr. Claiborne Billings   . Chronic headache   .  Chronic neck pain   . Diabetes mellitus   . Hyperlipidemia   . Hypertension 12/26/08   ECHO- EF>55%  . Insomnia    Past Surgical History:  Past Surgical History:  Procedure Laterality Date  . APPENDECTOMY    . cervical spine injection    . COLONOSCOPY  04-02-06    per [facility], clear, repeat in 10 yrs  . CORONARY ARTERY BYPASS GRAFT  2002   4 vessels    Social History:  reports that he quit smoking about 49 years ago. His smoking use included cigarettes. He has a 2.50 pack-year smoking history. He has never used smokeless tobacco. He reports that he does not drink alcohol and does not use drugs. Family History:  Family History  Problem Relation Age of Onset  . Liver cancer Father   . Cancer Brother   . Cancer Brother   . Cancer Brother   . Coronary artery disease Other   . Hypertension Other   . Heart disease Mother   . Colon cancer Neg Hx   . Stomach cancer Neg Hx   . Esophageal cancer Neg Hx      HOME MEDICATIONS: Allergies as of 11/11/2020      Reactions   Codeine Anaphylaxis   Pt. verbalized that he has taken and can take Hydrocodone, Oxycodone w/o issue - codeine allergy only  Crestor [rosuvastatin]    Myalgia    Isosorbide Nitrate Other (See Comments)   Headaches   Lipitor [atorvastatin]    myalgia   Zolpidem Tartrate    REACTION: HA's   Ciprofloxacin Hives, Rash   Victoza [liraglutide] Rash      Medication List       Accurate as of Nov 11, 2020  3:28 PM. If you have any questions, ask your nurse or doctor.        Accu-Chek Guide test strip Generic drug: glucose blood USE TO TEST BLOOD SUGAR EVERY DAY   amLODipine 5 MG tablet Commonly known as: NORVASC Take 1.5 tablets (7.5 mg total) by mouth daily.   aspirin 81 MG tablet Take 81 mg by mouth daily.   blood glucose meter kit and supplies Dispense based on patient and insurance preference. . (FOR ICD-10 E10.9, E11.9). Check blood sugar daily   CALCIUM 600+D PO Take 1 tablet by mouth  daily.   clonazePAM 0.5 MG tablet Commonly known as: KLONOPIN TAKE 1 TABLET BY MOUTH TWICE A DAY   CoQ10 100 MG Caps Take 100 mg by mouth daily.   ezetimibe 10 MG tablet Commonly known as: ZETIA TAKE 1 TABLET BY MOUTH EVERY DAY   Farxiga 10 MG Tabs tablet Generic drug: dapagliflozin propanediol TAKE 1 TABLET BY MOUTH EVERY DAY   fexofenadine 180 MG tablet Commonly known as: ALLEGRA Take 180 mg by mouth daily as needed for allergies or rhinitis.   gabapentin 300 MG capsule Commonly known as: NEURONTIN TAKE 3 CAPSULES BY MOUTH AT BEDTIME   glipiZIDE 5 MG tablet Commonly known as: GLUCOTROL Take 1 tablet (5 mg total) by mouth daily before breakfast.   isosorbide mononitrate 30 MG 24 hr tablet Commonly known as: IMDUR   Lancets Misc Fast click lancet accu check may substitute for whever pt has   lisinopril 40 MG tablet Commonly known as: ZESTRIL TAKE 1 TABLET BY MOUTH EVERY DAY   nebivolol 10 MG tablet Commonly known as: Bystolic Take 0.5 tablets (5 mg total) by mouth daily.   nitroGLYCERIN 0.4 MG SL tablet Commonly known as: NITROSTAT Place 1 tablet (0.4 mg total) under the tongue every 5 (five) minutes as needed for chest pain.   Omega-3 350 MG Cpdr Take 350 mg by mouth daily.   pioglitazone 45 MG tablet Commonly known as: ACTOS TAKE 1 TABLET BY MOUTH EVERY DAY   rosuvastatin 10 MG tablet Commonly known as: CRESTOR Take 1 tablet (10 mg total) by mouth daily.      PHYSICAL EXAM: VS: BP (!) 148/72   Pulse (!) 56   Ht 5' 9"  (1.753 m)   Wt 209 lb 8 oz (95 kg)   SpO2 98%   BMI 30.94 kg/m    EXAM: General: Pt appears well and is in NAD  Lungs: Clear with good BS bilat with no rales, rhonchi, or wheezes  Heart: Auscultation: RRR with normal S1 and S2, no gallops or murmurs  Abdomen: Normoactive bowel sounds, soft, nontender, without masses or organomegaly palpable  Extremities: BL LE: no pretibial edema normal ROM and strength, no joint enlargement or  tenderness  Mental Status: Judgment, insight: intact Mood and affect: no depression, anxiety, or agitation     DM Foot Exam 11/11/2020 The skin of the feet is intact without sores or ulcerations. The pedal pulses are 2+ on right and 2+ on left. The sensation is intact to a screening 5.07, 10 gram monofilament bilaterally    DATA REVIEWED:  Lab Results  Component Value Date   HGBA1C 8.1 (A) 11/11/2020   HGBA1C 7.4 (H) 08/15/2020   HGBA1C 7.4 (A) 04/25/2020   Lab Results  Component Value Date   MICROALBUR 0.5 08/21/2008   LDLCALC 125 (H) 08/15/2020   CREATININE 0.90 08/15/2020    Lab Results  Component Value Date   CHOL 193 08/15/2020   HDL 47 08/15/2020   LDLCALC 125 (H) 08/15/2020   LDLDIRECT 128.0 10/29/2011   TRIG 115 08/15/2020   CHOLHDL 4.1 08/15/2020       In-office Bg 239 mg/dL   ASSESSMENT / PLAN / RECOMMENDATIONS:   1) Type 2 Diabetes Mellitus, Acceptable control, With neuropathic and macrovascular complications - Most recent A1c of 8.1  %. Goal A1c < 7.5 %.      - Pt with hyperglycemia - Intolerant to Metformin due to diarrhea  - Will increase Glipizide  - Wife who is the caregiver has been traveling out of town to care for a mother with dementia, when he is alone he only eats corn bread and milk, hence hyperglycemia.  - We discussed options with steam bags and frozen chicken pieces     MEDICATIONS:  Increase Glipizide 5 mg , BID  Continue Farxiga 10 mg daily  Continue Actos 45 mg daily   EDUCATION / INSTRUCTIONS:  BG monitoring instructions: Patient is instructed to check his blood sugars 1 times a day, fasting .  Call [facility] if: BG persistently < 70  . I reviewed the Rule of 15 for the treatment of hypoglycemia in detail with the patient. Literature supplied.    2) Diabetic complications:   Eye: Does not have known diabetic retinopathy.   Neuro/ Feet: Does have known diabetic peripheral neuropathy.  Renal:  Patient does not have known baseline CKD. He is on an ACEI/ARB at present.   F/U in 4 months    Signed electronically by: Mack Guise, MD  [facility]  [facility] [address] [place], [place] [address] Phone: [phone] FAX: [phone]   CC: Laurey Morale, [place] [address] Phone: [phone]  Fax: [phone]  Return to Endocrinology clinic as below: No future appointments.

## 2020-11-25 ENCOUNTER — Other Ambulatory Visit: Payer: Self-pay | Admitting: Family Medicine

## 2020-12-04 ENCOUNTER — Other Ambulatory Visit: Payer: Self-pay | Admitting: Cardiovascular Disease

## 2021-01-14 ENCOUNTER — Other Ambulatory Visit: Payer: Self-pay | Admitting: Cardiovascular Disease

## 2021-01-14 ENCOUNTER — Other Ambulatory Visit: Payer: Self-pay | Admitting: Family Medicine

## 2021-01-16 ENCOUNTER — Telehealth: Payer: Self-pay | Admitting: Family Medicine

## 2021-01-16 NOTE — Telephone Encounter (Signed)
The patients wife called to let Dr. Clent Ridges know that she thinks that he has COVID again because he is having the same symptoms. She was wanting to know if Dr. Clent Ridges can call him another antibiotic that he had last time when he had COVID. He hasn't been tested for COVID but has the same symptoms.   She was talking about talking him to the Urgent Care but don't know if he will want to even move.   I suggested doing an e-visit through his MyChart so that away he wouldn't have to leave home.  She is going to try the e-visit

## 2021-01-16 NOTE — Telephone Encounter (Signed)
Last office visit--07/31/20 Please advise 

## 2021-01-17 ENCOUNTER — Other Ambulatory Visit: Payer: Self-pay

## 2021-01-17 ENCOUNTER — Encounter: Payer: Self-pay | Admitting: Family Medicine

## 2021-01-17 ENCOUNTER — Telehealth (INDEPENDENT_AMBULATORY_CARE_PROVIDER_SITE_OTHER): Payer: Medicare Other | Admitting: Family Medicine

## 2021-01-17 DIAGNOSIS — J019 Acute sinusitis, unspecified: Secondary | ICD-10-CM

## 2021-01-17 MED ORDER — SULFAMETHOXAZOLE-TRIMETHOPRIM 800-160 MG PO TABS: 1.0000 | ORAL_TABLET | Freq: Two times a day (BID) | ORAL | 0 refills | Status: DC

## 2021-01-17 NOTE — Progress Notes (Signed)
   Subjective:    Patient ID: [name], male    DOB: Apr 18, 1951, 70 y.o.   MRN: [id number]  HPI Virtual Visit via Telephone Note  I connected with the patient on 01/17/21 at  3:45 PM EDT by telephone and verified that I am speaking with the correct person using two identifiers.   I discussed the limitations, risks, security and privacy concerns of performing an evaluation and management service by telephone and the availability of in person appointments. I also discussed with the patient that there may be a patient responsible charge related to this service. The patient expressed understanding and agreed to proceed.  Location patient: home Location provider: work or home office Participants present for the call: patient, provider Patient did not have a visit in the prior 7 days to address this/these issue(s).   History of Present Illness: Here for 5 days of fever, sinus pressure, PND, and a dry cough. No body aches or SOB. No NVD. Taking Mucinex.    Observations/Objective: Patient sounds cheerful and well on the phone. I do not appreciate any SOB. Speech and thought processing are grossly intact. Patient reported vitals:  Assessment and Plan: Sinusitis, treat with bactrim DS. Gershon Crane, MD   Follow Up Instructions:     [phone] 11-20 [phone] 21-30 I did not refer this patient for an OV in the next 24 hours for this/these issue(s).  I discussed the assessment and treatment plan with the patient. The patient was provided an opportunity to ask questions and all were answered. The patient agreed with the plan and demonstrated an understanding of the instructions.   The patient was advised to call back or seek an in-person evaluation if the symptoms worsen or if the condition fails to improve as anticipated.  I provided 17 minutes of non-face-to-face time during this encounter.   Gershon Crane, MD     Review of Systems     Objective:   Physical  Exam        Assessment & Plan:

## 2021-01-17 NOTE — Telephone Encounter (Signed)
He should be seen today but we are full. I suggest he go to urgent care

## 2021-01-17 NOTE — Telephone Encounter (Signed)
Called pt wife [name], appointment was previously made for 01/17/21 at 3:45pm

## 2021-01-24 ENCOUNTER — Other Ambulatory Visit: Payer: Self-pay

## 2021-01-24 ENCOUNTER — Telehealth: Payer: Self-pay | Admitting: Family Medicine

## 2021-01-24 ENCOUNTER — Other Ambulatory Visit: Payer: Self-pay | Admitting: Family Medicine

## 2021-01-24 DIAGNOSIS — J309 Allergic rhinitis, unspecified: Secondary | ICD-10-CM | POA: Diagnosis not present

## 2021-01-24 DIAGNOSIS — E119 Type 2 diabetes mellitus without complications: Secondary | ICD-10-CM | POA: Diagnosis not present

## 2021-01-24 DIAGNOSIS — I10 Essential (primary) hypertension: Secondary | ICD-10-CM | POA: Diagnosis not present

## 2021-01-24 MED ORDER — DOXYCYCLINE HYCLATE 100 MG PO TABS: ORAL_TABLET | ORAL | 0 refills | Status: DC

## 2021-01-24 NOTE — Telephone Encounter (Signed)
Spoke with patient wife [name] to inform that Doxycycline 100 mg, sent to [facility] on [address].

## 2021-01-24 NOTE — Telephone Encounter (Signed)
Call in Doxycycline 100 mg BID for 10 days  

## 2021-01-24 NOTE — Telephone Encounter (Signed)
Last video visit- 01/17/21.  Please advise

## 2021-01-24 NOTE — Telephone Encounter (Signed)
Patient is still weak and coughing alot even after treatment.  Still very fatigued.  No fever, has not tested for Covid.  He has visited 2 urgent care facilities and they are turning people away because they are full.  Patient's wife [name] is requesting a call back.

## 2021-02-22 ENCOUNTER — Other Ambulatory Visit: Payer: Self-pay | Admitting: Cardiovascular Disease

## 2021-03-13 ENCOUNTER — Other Ambulatory Visit: Payer: Self-pay | Admitting: Internal Medicine

## 2021-03-14 DIAGNOSIS — K921 Melena: Secondary | ICD-10-CM | POA: Diagnosis not present

## 2021-03-14 DIAGNOSIS — R10814 Left lower quadrant abdominal tenderness: Secondary | ICD-10-CM | POA: Diagnosis not present

## 2021-03-17 ENCOUNTER — Ambulatory Visit (INDEPENDENT_AMBULATORY_CARE_PROVIDER_SITE_OTHER): Payer: Medicare Other | Admitting: Family Medicine

## 2021-03-17 ENCOUNTER — Other Ambulatory Visit: Payer: Self-pay

## 2021-03-17 ENCOUNTER — Encounter: Payer: Self-pay | Admitting: Family Medicine

## 2021-03-17 VITALS — BP 118/70 | HR 68 | Temp 97.5°F | Wt 208.0 lb

## 2021-03-17 DIAGNOSIS — R1030 Lower abdominal pain, unspecified: Secondary | ICD-10-CM | POA: Diagnosis not present

## 2021-03-17 DIAGNOSIS — R102 Pelvic and perineal pain: Secondary | ICD-10-CM | POA: Diagnosis not present

## 2021-03-17 DIAGNOSIS — K625 Hemorrhage of anus and rectum: Secondary | ICD-10-CM | POA: Diagnosis not present

## 2021-03-17 NOTE — Progress Notes (Signed)
   Subjective:    Patient ID: [name], male    DOB: 1951-04-09, 70 y.o.   MRN: [id number]  HPI Here to follow up an urgent care visit on 03-14-21. This all started on 03-07-21 when he and his wife went to a restaurant and they both had the same meal, which consisted of steaks, potatoes, and peppers. He drank sweet tea and she had unsweet tea. About one hour after eating he began to have severe lower abdominal pains with nausea and vomiting times 3. Later that evening he also developed diarrhea. During two of these BM's he noticed bright red blood in the toilet bowl. No fever. His wife has felt fine. The worst pains eased up after 2 days, and the diarrhea and nausea resolved after 3 days. He has not seen any more blood since that first night. They were concerned so they went to an urgent care clinic on 03-14-21. He had some labs drawn there but they have heard any results. He was given a course of Metronidazole for presumed diverticulitis, and he is halfway through this. He was told he should also have a CT scan of his abdomen, but the clinic provider told him to see his PCP for this. Sharlynn Oliphant has only had one colonoscopy in 2007 which was negative. He has had no recent travel. Today he feels fine.    Review of Systems  Constitutional: Negative.   Respiratory: Negative.    Cardiovascular: Negative.   Gastrointestinal:  Positive for abdominal pain, blood in stool, diarrhea, nausea and vomiting. Negative for abdominal distention, constipation and rectal pain.  Genitourinary: Negative.       Objective:   Physical Exam Constitutional:      Appearance: Normal appearance. He is not ill-appearing.  Cardiovascular:     Rate and Rhythm: Normal rate and regular rhythm.     Pulses: Normal pulses.     Heart sounds: Normal heart sounds.  Pulmonary:     Effort: Pulmonary effort is normal.     Breath sounds: Normal breath sounds.  Abdominal:     General: Abdomen is flat. Bowel sounds are normal. There is  no distension.     Palpations: Abdomen is soft. There is no mass.     Tenderness: There is no abdominal tenderness. There is no guarding or rebound.     Hernia: No hernia is present.  Neurological:     Mental Status: He is alert.          Assessment & Plan:  He had several days of abdominal pains, vomiting, diarrhea, and seeing BR blood in the stool. No fever. He has improved, though I am not convinced the Metronidazole has helped. It seems that the most likely etiology of this was food poisoning. Other possibilities include diverticulitis or bleeding ulcers. We will check a CBC and a BMET today, and we will order a contrasted CT of the abdomen and pelvis. I advised him to finish out the 10 days of Metronidazole. Once these results come back, I plan to refer him to GI for both upper and lower endoscopy. We spent 35 minutes reviewing records and discussing these issues.  Gershon Crane, MD

## 2021-03-18 LAB — CBC WITH DIFFERENTIAL/PLATELET
Basophils Absolute: 0.1 10*3/uL (ref 0.0–0.1)
Basophils Relative: 1.1 % (ref 0.0–3.0)
Eosinophils Absolute: 0 10*3/uL (ref 0.0–0.7)
Eosinophils Relative: 0.4 % (ref 0.0–5.0)
HCT: 39.3 % (ref 39.0–52.0)
Hemoglobin: 12.9 g/dL — ABNORMAL LOW (ref 13.0–17.0)
Lymphocytes Relative: 25.9 % (ref 12.0–46.0)
Lymphs Abs: 1.7 10*3/uL (ref 0.7–4.0)
MCHC: 32.7 g/dL (ref 30.0–36.0)
MCV: 82 fl (ref 78.0–100.0)
Monocytes Absolute: 0.7 10*3/uL (ref 0.1–1.0)
Monocytes Relative: 10.7 % (ref 3.0–12.0)
Neutro Abs: 4.1 10*3/uL (ref 1.4–7.7)
Neutrophils Relative %: 61.9 % (ref 43.0–77.0)
Platelets: 210 10*3/uL (ref 150.0–400.0)
RBC: 4.8 Mil/uL (ref 4.22–5.81)
RDW: 16.6 % — ABNORMAL HIGH (ref 11.5–15.5)
WBC: 6.7 10*3/uL (ref 4.0–10.5)

## 2021-03-18 LAB — BASIC METABOLIC PANEL
BUN: 14 mg/dL (ref 6–23)
CO2: 27 mEq/L (ref 19–32)
Calcium: 9.7 mg/dL (ref 8.4–10.5)
Chloride: 104 mEq/L (ref 96–112)
Creatinine, Ser: 1.02 mg/dL (ref 0.40–1.50)
GFR: 73.97 mL/min (ref >60.00)
Glucose, Bld: 124 mg/dL — ABNORMAL HIGH (ref 70–99)
Potassium: 3.9 mEq/L (ref 3.5–5.1)
Sodium: 141 mEq/L (ref 135–145)

## 2021-03-19 ENCOUNTER — Other Ambulatory Visit: Payer: Self-pay | Admitting: Internal Medicine

## 2021-03-19 ENCOUNTER — Ambulatory Visit (INDEPENDENT_AMBULATORY_CARE_PROVIDER_SITE_OTHER): Payer: Medicare Other | Admitting: Internal Medicine

## 2021-03-19 ENCOUNTER — Other Ambulatory Visit: Payer: Self-pay

## 2021-03-19 ENCOUNTER — Encounter: Payer: Self-pay | Admitting: Internal Medicine

## 2021-03-19 ENCOUNTER — Telehealth: Payer: Self-pay | Admitting: Family Medicine

## 2021-03-19 VITALS — BP 142/82 | HR 69 | Ht 69.0 in | Wt 207.6 lb

## 2021-03-19 DIAGNOSIS — E1159 Type 2 diabetes mellitus with other circulatory complications: Secondary | ICD-10-CM | POA: Diagnosis not present

## 2021-03-19 DIAGNOSIS — E1142 Type 2 diabetes mellitus with diabetic polyneuropathy: Secondary | ICD-10-CM

## 2021-03-19 LAB — GLUCOSE, POCT (MANUAL RESULT ENTRY): POC Glucose: 140 mg/dl — AB (ref 70–99)

## 2021-03-19 LAB — POCT GLYCOSYLATED HEMOGLOBIN (HGB A1C): Hemoglobin A1C: 7.5 % — AB (ref 4.0–5.6)

## 2021-03-19 MED ORDER — DAPAGLIFLOZIN PROPANEDIOL 10 MG PO TABS: 10.0000 mg | ORAL_TABLET | Freq: Every day | ORAL | 3 refills | Status: DC

## 2021-03-19 MED ORDER — PIOGLITAZONE HCL 45 MG PO TABS: 45.0000 mg | ORAL_TABLET | Freq: Every day | ORAL | 3 refills | Status: DC

## 2021-03-19 MED ORDER — GLIPIZIDE 5 MG PO TABS: 5.0000 mg | ORAL_TABLET | Freq: Two times a day (BID) | ORAL | 3 refills | Status: DC

## 2021-03-19 NOTE — Progress Notes (Signed)
Name: [name]   Age/ Sex: 70 y.o. male   MRN/ DOB: [id number]/ 1951/01/14     PCP: Laurey Morale, MD   Reason for Endocrinology Evaluation: Type 2 Diabetes Mellitus  Initial Endocrine Consultative Visit: 08/31/2018    PATIENT IDENTIFIER: [name] is a 70 y.o. male with a past medical history of T2DM,CAD (S/P CABG). The patient has followed with Endocrinology clinic since 08/31/2018 for consultative assistance with management of his diabetes.  DIABETIC HISTORY:  [name] was diagnosed with T2DM in 2010, he has been on Metformin and Actos for many years, he is intolerant to Victoza due to rash. Januvia has been ineffective , which he stopped 08/2018. He has never been on insulin . His hemoglobin A1c has ranged from 6.1% in 2017, peaking at 10.4% in 2019  On his initial presentation his A1c was 8.5% . He was on Metformin and Actos . We added Farxiga   Glipizide started 04/2020 Self stopped Metformin due to diarrhea 09/2020  SUBJECTIVE:   During the last visit (11/11/2020): A1c 8.1%. Continued Farxiga, and Actos and increased Glipizide       Today (03/19/2021): [name] is here for a  follow up visit on his diabetes.He checks his sugar occasionally. He did not bring his meter today , he endorses hypoglycemia a long time ago while working outside. His Bg's typically are in the 90's    Diarrhea have resolved, denies nausea or vomiting    HOME DIABETES REGIMEN:  Actos 45 mg daily  Farxiga 10 mg daily Glipizide 5 mg BID     GLUCOSE LOG : Did not bring     DIABETIC COMPLICATIONS: Microvascular complications:  Neuropathy  Denies: ckd , retinopathy  Last eye exam: Completed 2022    Macrovascular complications:  CAD Denies: PVD, CVA      HISTORY:  Past Medical History:  Past Medical History:  Diagnosis Date   CAD (coronary artery disease)    nuclear stress test-09/09/11 low risk scan EF61%, sees Dr. Claiborne Billings    Chronic headache     Chronic neck pain    Diabetes mellitus    Hyperlipidemia    Hypertension 12/26/08   ECHO- EF>55%   Insomnia    Past Surgical History:  Past Surgical History:  Procedure Laterality Date   APPENDECTOMY     cervical spine injection     COLONOSCOPY  04-02-06    per [facility], clear, repeat in 10 yrs   CORONARY ARTERY BYPASS GRAFT  2002   4 vessels   Social History:  reports that he quit smoking about 50 years ago. His smoking use included cigarettes. He has a 2.50 pack-year smoking history. He has never used smokeless tobacco. He reports that he does not drink alcohol and does not use drugs. Family History:  Family History  Problem Relation Age of Onset   Liver cancer Father    Cancer Brother    Cancer Brother    Cancer Brother    Coronary artery disease Other    Hypertension Other    Heart disease Mother    Colon cancer Neg Hx    Stomach cancer Neg Hx    Esophageal cancer Neg Hx      HOME MEDICATIONS: Allergies as of 03/19/2021       Reactions   Codeine Anaphylaxis   Pt. verbalized that he has taken and can take Hydrocodone, Oxycodone w/o issue - codeine allergy only   Crestor [rosuvastatin]  Myalgia    Isosorbide Nitrate Other (See Comments)   Headaches   Lipitor [atorvastatin]    myalgia   Zolpidem Tartrate    REACTION: HA's   Ciprofloxacin Hives, Rash   Victoza [liraglutide] Rash        Medication List        Accurate as of March 19, 2021  2:43 PM. If you have any questions, ask your nurse or doctor.          Accu-Chek Guide test strip Generic drug: glucose blood USE TO TEST BLOOD SUGAR EVERY DAY   Accu-Chek Softclix Lancets lancets USE TO CHECK BLOOD SUGAR ONCE DAILY   amLODipine 5 MG tablet Commonly known as: NORVASC Take 1.5 tablets (7.5 mg total) by mouth daily.   aspirin 81 MG tablet Take 81 mg by mouth daily.   blood glucose meter kit and supplies Dispense based on patient and insurance preference. . (FOR ICD-10 E10.9, E11.9).  Check blood sugar daily   CALCIUM 600+D PO Take 1 tablet by mouth daily.   clonazePAM 0.5 MG tablet Commonly known as: KLONOPIN TAKE 1 TABLET BY MOUTH TWICE A DAY   CoQ10 100 MG Caps Take 100 mg by mouth daily.   ezetimibe 10 MG tablet Commonly known as: ZETIA TAKE 1 TABLET BY MOUTH EVERY DAY   Farxiga 10 MG Tabs tablet Generic drug: dapagliflozin propanediol TAKE 1 TABLET BY MOUTH EVERY DAY   fexofenadine 180 MG tablet Commonly known as: ALLEGRA Take 180 mg by mouth daily as needed for allergies or rhinitis.   gabapentin 300 MG capsule Commonly known as: NEURONTIN TAKE 3 CAPSULES BY MOUTH AT BEDTIME   glipiZIDE 5 MG tablet Commonly known as: GLUCOTROL TAKE 1 TABLET BY MOUTH DAILY BEFORE BREAKFAST.   isosorbide mononitrate 30 MG 24 hr tablet Commonly known as: IMDUR   lisinopril 40 MG tablet Commonly known as: ZESTRIL TAKE 1 TABLET BY MOUTH EVERY DAY   nebivolol 10 MG tablet Commonly known as: BYSTOLIC TAKE 1/2 TABLET BY MOUTH DAILY   nitroGLYCERIN 0.4 MG SL tablet Commonly known as: NITROSTAT Place 1 tablet (0.4 mg total) under the tongue every 5 (five) minutes as needed for chest pain.   Omega-3 350 MG Cpdr Take 350 mg by mouth daily.   pioglitazone 45 MG tablet Commonly known as: ACTOS TAKE 1 TABLET BY MOUTH EVERY DAY   rosuvastatin 10 MG tablet Commonly known as: CRESTOR Take 1 tablet (10 mg total) by mouth daily.   sulfamethoxazole-trimethoprim 800-160 MG tablet Commonly known as: BACTRIM DS Take 1 tablet by mouth 2 (two) times daily.       PHYSICAL EXAM: VS: BP (!) 142/82 (BP Location: Left Arm, Patient Position: Sitting, Cuff Size: Small)   Pulse 69   Ht 5' 9"  (1.753 m)   Wt 207 lb 9.6 oz (94.2 kg)   SpO2 97%   BMI 30.66 kg/m    EXAM: General: Pt appears well and is in NAD  Lungs: Clear with good BS bilat with no rales, rhonchi, or wheezes  Heart: Auscultation: RRR with normal S1 and S2, no gallops or murmurs  Abdomen: Normoactive  bowel sounds, soft, nontender, without masses or organomegaly palpable  Extremities: BL LE: no pretibial edema normal ROM and strength, no joint enlargement or tenderness  Mental Status: Judgment, insight: intact Mood and affect: no depression, anxiety, or agitation     DM Foot Exam 11/11/2020 The skin of the feet is intact without sores or ulcerations. The pedal pulses are 2+ on  right and 2+ on left. The sensation is intact to a screening 5.07, 10 gram monofilament bilaterally    DATA REVIEWED:  Lab Results  Component Value Date   HGBA1C 8.1 (A) 11/11/2020   HGBA1C 7.4 (H) 08/15/2020   HGBA1C 7.4 (A) 04/25/2020   Lab Results  Component Value Date   MICROALBUR 0.5 08/21/2008   LDLCALC 125 (H) 08/15/2020   CREATININE 1.02 03/17/2021    Lab Results  Component Value Date   CHOL 193 08/15/2020   HDL 47 08/15/2020   LDLCALC 125 (H) 08/15/2020   LDLDIRECT 128.0 10/29/2011   TRIG 115 08/15/2020   CHOLHDL 4.1 08/15/2020         ASSESSMENT / PLAN / RECOMMENDATIONS:   1) Type 2 Diabetes Mellitus, Acceptable control, With neuropathic and macrovascular complications - Most recent A1c of 7.5  %. Goal A1c < 7.5 %.      - Intolerant to Metformin due to diarrhea  - Pt advised to bring meter on next visit  - We discussed risk of hypoglycemia while on Glipizide, importance of not skipping meals . Pt advised to eat a snack if he is going to work in the yard.    MEDICATIONS: Continue  Glipizide 5 mg , 1 tablet twice a day Continue Farxiga 10 mg daily Continue Actos 45 mg daily   EDUCATION / INSTRUCTIONS: BG monitoring instructions: Patient is instructed to check his blood sugars 1 times a day, fasting . Call [facility] if: BG persistently < 70  I reviewed the Rule of 15 for the treatment of hypoglycemia in detail with the patient. Literature supplied.    2) Diabetic complications:  Eye: Does not have known diabetic retinopathy.  Neuro/ Feet: Does have known  diabetic peripheral neuropathy. Renal: Patient does not have known baseline CKD. He is on an ACEI/ARB at present.    F/U in 6 months    Signed electronically by: Mack Guise, MD  [facility]  [facility] [address] [place], [place] [address] Phone: [phone] FAX: [phone]   CC: Laurey Morale, [place] [address] Phone: [phone]  Fax: [phone]  Return to Endocrinology clinic as below: Future Appointments  Date Time Provider [place]  03/19/2021  3:20 PM Zane Samson, Melanie Crazier, MD [facility] None  03/25/2021  3:30 PM [facility]

## 2021-03-19 NOTE — Telephone Encounter (Signed)
Last office note 03/17/21.  Requesting referral to GI

## 2021-03-19 NOTE — Telephone Encounter (Signed)
PT wife called to followup on the endoscopy and colonoscopy that was talked about this Monday. They have not heard anything and want to find out what is going on and when it will take place. Pease advise and only call the Wife as she states the PT gets very confuse.

## 2021-03-19 NOTE — Telephone Encounter (Signed)
I will do the referral to GI, but I need to get the CT scan done first

## 2021-03-19 NOTE — Telephone Encounter (Signed)
Spoke with wife [name] about lab results, information given about CT scan. Nothing further is needed

## 2021-03-19 NOTE — Telephone Encounter (Signed)
.  TELEPHONE ENCOUNTER PRIMARY CARE PROVIDER: Nelwyn Salisbury, MD  REASON FOR CALL: The patient would like the CMA to give him a call to provide him with his lab results. The patient cannot log on to my chart and does not understand how my chart works. Pt is also scheduled for his CT on 03/25/2021 at @3 :30 pm pt needs to go to [facility] to pick up the contrast and get instructions on how to drink the contrast.  He could not write the information down when i was speaking with him. , could you remind the patient of this when you talk about his lab results? I wanted to send him the information on my chart; he didn't know how to work my chart.   [facility] for his CT Address to [address]  3rd floor [phone]   LAST VISIT IN THIS CLINIC:  03/17/2021 NEXT APPOINTMENT SCHEDULED IN THIS CLINIC: Visit date not found

## 2021-03-19 NOTE — Patient Instructions (Signed)
-   Keep up the Good Work !  - Continue Glipizide 5 mg, 1 tablet before Breakfast and 1 tablet before supper - Continue Actos 45 mg daily  - Continue Farxiga 10 mg once a day     HOW TO TREAT LOW BLOOD SUGARS (Blood sugar LESS THAN 70 MG/DL) Please follow the RULE OF 15 for the treatment of hypoglycemia treatment (when your (blood sugars are less than 70 mg/dL)   STEP 1: Take 15 grams of carbohydrates when your blood sugar is low, which includes:  3-4 GLUCOSE TABS  OR 3-4 OZ OF JUICE OR REGULAR SODA OR ONE TUBE OF GLUCOSE GEL    STEP 2: RECHECK blood sugar in 15 MINUTES STEP 3: If your blood sugar is still low at the 15 minute recheck --> then, go back to STEP 1 and treat AGAIN with another 15 grams of carbohydrates.

## 2021-03-20 NOTE — Telephone Encounter (Signed)
Spoke with pt wife who is on pt DPR, advised of Dr Clent Ridges recommendations, verbalized understanding

## 2021-03-25 ENCOUNTER — Other Ambulatory Visit: Payer: Self-pay | Admitting: Family Medicine

## 2021-03-25 ENCOUNTER — Telehealth: Payer: Self-pay

## 2021-03-25 ENCOUNTER — Ambulatory Visit (INDEPENDENT_AMBULATORY_CARE_PROVIDER_SITE_OTHER)
Admission: RE | Admit: 2021-03-25 | Discharge: 2021-03-25 | Disposition: A | Discharge status: Home / Self Care | Payer: Medicare Other | Source: Ambulatory Visit | Attending: Family Medicine | Admitting: Family Medicine

## 2021-03-25 ENCOUNTER — Other Ambulatory Visit: Payer: Self-pay

## 2021-03-25 DIAGNOSIS — R1030 Lower abdominal pain, unspecified: Secondary | ICD-10-CM

## 2021-03-25 DIAGNOSIS — R109 Unspecified abdominal pain: Secondary | ICD-10-CM | POA: Diagnosis not present

## 2021-03-25 MED ORDER — IOHEXOL 350 MG/ML SOLN
80.0000 mL | Freq: Once | INTRAVENOUS | Status: AC | PRN
  Administered 2021-03-25: 80 mL via INTRAVENOUS

## 2021-03-25 NOTE — Telephone Encounter (Signed)
[facility] called requesting for Dr Clent Ridges to change the noted on pt chart regarding reasons for the CT, advised to disregard the notes in the referral and to change the diagnosis to Lower Abdominal pain- R-10.30 Per Dr Clent Ridges

## 2021-06-14 ENCOUNTER — Other Ambulatory Visit: Payer: Self-pay | Admitting: Family Medicine

## 2021-06-16 NOTE — Telephone Encounter (Signed)
Last OV-03/17/21 Last refill Klonopin- 10/24/20--60 tabs, 5 refills  No future office visit scheduled.   Can this patient receive a refill?

## 2021-07-25 ENCOUNTER — Encounter: Payer: Medicare Other | Admitting: Family Medicine

## 2021-07-30 ENCOUNTER — Encounter: Payer: Medicare Other | Admitting: Family Medicine

## 2021-07-31 ENCOUNTER — Ambulatory Visit: Payer: Medicare Other | Admitting: Physician Assistant

## 2021-07-31 ENCOUNTER — Other Ambulatory Visit: Payer: Self-pay

## 2021-08-05 ENCOUNTER — Ambulatory Visit (INDEPENDENT_AMBULATORY_CARE_PROVIDER_SITE_OTHER): Payer: Medicare Other | Admitting: Family Medicine

## 2021-08-05 ENCOUNTER — Encounter: Payer: Self-pay | Admitting: Family Medicine

## 2021-08-05 VITALS — BP 126/78 | HR 61 | Temp 98.1°F | Ht 69.5 in | Wt 208.0 lb

## 2021-08-05 DIAGNOSIS — F5101 Primary insomnia: Secondary | ICD-10-CM

## 2021-08-05 DIAGNOSIS — I10 Essential (primary) hypertension: Secondary | ICD-10-CM | POA: Diagnosis not present

## 2021-08-05 DIAGNOSIS — N138 Other obstructive and reflux uropathy: Secondary | ICD-10-CM

## 2021-08-05 DIAGNOSIS — F411 Generalized anxiety disorder: Secondary | ICD-10-CM | POA: Diagnosis not present

## 2021-08-05 DIAGNOSIS — I2510 Atherosclerotic heart disease of native coronary artery without angina pectoris: Secondary | ICD-10-CM

## 2021-08-05 DIAGNOSIS — N401 Enlarged prostate with lower urinary tract symptoms: Secondary | ICD-10-CM | POA: Diagnosis not present

## 2021-08-05 DIAGNOSIS — E785 Hyperlipidemia, unspecified: Secondary | ICD-10-CM

## 2021-08-05 DIAGNOSIS — E119 Type 2 diabetes mellitus without complications: Secondary | ICD-10-CM

## 2021-08-05 DIAGNOSIS — N529 Male erectile dysfunction, unspecified: Secondary | ICD-10-CM

## 2021-08-05 LAB — BASIC METABOLIC PANEL
BUN: 19 mg/dL (ref 6–23)
CO2: 28 mEq/L (ref 19–32)
Calcium: 9.4 mg/dL (ref 8.4–10.5)
Chloride: 106 mEq/L (ref 96–112)
Creatinine, Ser: 1.05 mg/dL (ref 0.40–1.50)
GFR: 71.25 mL/min (ref >60.00)
Glucose, Bld: 119 mg/dL — ABNORMAL HIGH (ref 70–99)
Potassium: 3.9 mEq/L (ref 3.5–5.1)
Sodium: 142 mEq/L (ref 135–145)

## 2021-08-05 LAB — CBC WITH DIFFERENTIAL/PLATELET
Basophils Absolute: 0 10*3/uL (ref 0.0–0.1)
Basophils Relative: 0.7 % (ref 0.0–3.0)
Eosinophils Absolute: 0 10*3/uL (ref 0.0–0.7)
Eosinophils Relative: 0.8 % (ref 0.0–5.0)
HCT: 39.7 % (ref 39.0–52.0)
Hemoglobin: 12.7 g/dL — ABNORMAL LOW (ref 13.0–17.0)
Lymphocytes Relative: 30.8 % (ref 12.0–46.0)
Lymphs Abs: 1.8 10*3/uL (ref 0.7–4.0)
MCHC: 32 g/dL (ref 30.0–36.0)
MCV: 81.1 fl (ref 78.0–100.0)
Monocytes Absolute: 0.8 10*3/uL (ref 0.1–1.0)
Monocytes Relative: 13.7 % — ABNORMAL HIGH (ref 3.0–12.0)
Neutro Abs: 3.2 10*3/uL (ref 1.4–7.7)
Neutrophils Relative %: 54 % (ref 43.0–77.0)
Platelets: 212 10*3/uL (ref 150.0–400.0)
RBC: 4.9 Mil/uL (ref 4.22–5.81)
RDW: 15.7 % — ABNORMAL HIGH (ref 11.5–15.5)
WBC: 5.9 10*3/uL (ref 4.0–10.5)

## 2021-08-05 LAB — HEPATIC FUNCTION PANEL
ALT: 18 U/L (ref 0–53)
AST: 22 U/L (ref 0–37)
Albumin: 4.3 g/dL (ref 3.5–5.2)
Alkaline Phosphatase: 82 U/L (ref 39–117)
Bilirubin, Direct: 0.1 mg/dL (ref 0.0–0.3)
Total Bilirubin: 0.4 mg/dL (ref 0.2–1.2)
Total Protein: 7.1 g/dL (ref 6.0–8.3)

## 2021-08-05 LAB — LIPID PANEL
Cholesterol: 180 mg/dL (ref 0–200)
HDL: 42.9 mg/dL (ref >39.00)
LDL Cholesterol: 117 mg/dL — ABNORMAL HIGH (ref 0–99)
NonHDL: 137.52
Total CHOL/HDL Ratio: 4
Triglycerides: 102 mg/dL (ref 0.0–149.0)
VLDL: 20.4 mg/dL (ref 0.0–40.0)

## 2021-08-05 LAB — TSH: TSH: 4.88 u[IU]/mL (ref 0.35–5.50)

## 2021-08-05 LAB — PSA: PSA: 1.57 ng/mL (ref 0.10–4.00)

## 2021-08-05 MED ORDER — TAMSULOSIN HCL 0.4 MG PO CAPS: 0.4000 mg | ORAL_CAPSULE | Freq: Every day | ORAL | 3 refills | Status: DC

## 2021-08-05 MED ORDER — SILDENAFIL CITRATE 100 MG PO TABS: 100.0000 mg | ORAL_TABLET | Freq: Every day | ORAL | 11 refills | Status: DC | PRN

## 2021-08-05 MED ORDER — AMLODIPINE BESYLATE 5 MG PO TABS
7.5000 mg | ORAL_TABLET | Freq: Every day | ORAL | 3 refills | Status: DC
Start: 2021-08-05 — End: 2021-10-20

## 2021-08-05 NOTE — Progress Notes (Signed)
Subjective:    Patient ID: [name], male    DOB: 1951-01-07, 71 y.o.   MRN: [id number]  HPI Here to follow up on issues. He is doing well as far as his diabetes goes. He sees Dr. Lonzo Cloud, and his last A1c in September was down to 7.5. his BP is stable. He sees Dr. Tresa Endo for CAD. He watches his diet as far as his cholesterol goes, but he does not tolerate Zetia or any type of statin. He does have two concerns today. First his urine stream is slow, and he has to get up to urinate 3-4 times every night. Also he has had trouble with erections this past year.    Review of Systems  Constitutional: Negative.   HENT: Negative.    Eyes: Negative.   Respiratory: Negative.    Cardiovascular: Negative.   Gastrointestinal: Negative.   Genitourinary:  Positive for difficulty urinating and frequency.  Musculoskeletal: Negative.   Skin: Negative.   Neurological: Negative.   Psychiatric/Behavioral: Negative.        Objective:   Physical Exam Constitutional:      General: He is not in acute distress.    Appearance: Normal appearance. He is well-developed. He is not diaphoretic.  HENT:     Head: Normocephalic and atraumatic.     Right Ear: External ear normal.     Left Ear: External ear normal.     Nose: Nose normal.     Mouth/Throat:     Pharynx: No oropharyngeal exudate.  Eyes:     General: No scleral icterus.       Right eye: No discharge.        Left eye: No discharge.     Conjunctiva/sclera: Conjunctivae normal.     Pupils: Pupils are equal, round, and reactive to light.  Neck:     Thyroid: No thyromegaly.     Vascular: No JVD.     Trachea: No tracheal deviation.  Cardiovascular:     Rate and Rhythm: Normal rate and regular rhythm.     Heart sounds: Normal heart sounds. No murmur heard.   No friction rub. No gallop.  Pulmonary:     Effort: Pulmonary effort is normal. No respiratory distress.     Breath sounds: Normal breath sounds. No wheezing or rales.  Chest:      Chest wall: No tenderness.  Abdominal:     General: Bowel sounds are normal. There is no distension.     Palpations: Abdomen is soft. There is no mass.     Tenderness: There is no abdominal tenderness. There is no guarding or rebound.  Genitourinary:    Penis: Normal. No tenderness.      Testes: Normal.     Rectum: Normal. Guaiac result negative.     Comments: Prostate is smooth and moderately enlarged  Musculoskeletal:        General: No tenderness. Normal range of motion.     Cervical back: Neck supple.  Lymphadenopathy:     Cervical: No cervical adenopathy.  Skin:    General: Skin is warm and dry.     Coloration: Skin is not pale.     Findings: No erythema or rash.  Neurological:     Mental Status: He is alert and oriented to person, place, and time.     Cranial Nerves: No cranial nerve deficit.     Motor: No abnormal muscle tone.     Coordination: Coordination normal.     Deep Tendon Reflexes:  Reflexes are normal and symmetric. Reflexes normal.  Psychiatric:        Behavior: Behavior normal.        Thought Content: Thought content normal.        Judgment: Judgment normal.          Assessment & Plan:  His HTN and diabetes and CAD are stable. We will get fasting labs to check lipids, etc. For the BPH, he will try Flomax 0.4 mg daily. For the ED, he will try Viagra 100 mg as needed. He declines to have any more colonoscopies. We spent a total of ( 30  ) minutes reviewing records and discussing these issues.  Gershon Crane, MD

## 2021-09-02 ENCOUNTER — Telehealth: Payer: Medicare Other | Admitting: Family Medicine

## 2021-09-02 ENCOUNTER — Ambulatory Visit (INDEPENDENT_AMBULATORY_CARE_PROVIDER_SITE_OTHER): Payer: Medicare Other | Admitting: Family Medicine

## 2021-09-02 ENCOUNTER — Encounter: Payer: Self-pay | Admitting: Family Medicine

## 2021-09-02 VITALS — BP 108/60 | HR 60 | Temp 99.3°F | Wt 207.0 lb

## 2021-09-02 DIAGNOSIS — N390 Urinary tract infection, site not specified: Secondary | ICD-10-CM

## 2021-09-02 DIAGNOSIS — R509 Fever, unspecified: Secondary | ICD-10-CM | POA: Diagnosis not present

## 2021-09-02 DIAGNOSIS — M940 Chondrocostal junction syndrome [Tietze]: Secondary | ICD-10-CM

## 2021-09-02 DIAGNOSIS — R630 Anorexia: Secondary | ICD-10-CM

## 2021-09-02 DIAGNOSIS — R5383 Other fatigue: Secondary | ICD-10-CM | POA: Diagnosis not present

## 2021-09-02 DIAGNOSIS — N419 Inflammatory disease of prostate, unspecified: Secondary | ICD-10-CM

## 2021-09-02 LAB — POC URINALSYSI DIPSTICK (AUTOMATED)
Bilirubin, UA: NEGATIVE
Blood, UA: NEGATIVE
Glucose, UA: POSITIVE — AB
Ketones, UA: NEGATIVE
Leukocytes, UA: NEGATIVE
Nitrite, UA: NEGATIVE
Protein, UA: NEGATIVE
Spec Grav, UA: 1.015 (ref 1.010–1.025)
Urobilinogen, UA: 0.2 E.U./dL
pH, UA: 6 (ref 5.0–8.0)

## 2021-09-02 LAB — POCT INFLUENZA A/B
Influenza A, POC: NEGATIVE
Influenza B, POC: NEGATIVE

## 2021-09-02 LAB — POC COVID19 BINAXNOW: SARS Coronavirus 2 Ag: NEGATIVE

## 2021-09-02 MED ORDER — SULFAMETHOXAZOLE-TRIMETHOPRIM 800-160 MG PO TABS: 1.0000 | ORAL_TABLET | Freq: Two times a day (BID) | ORAL | 0 refills | Status: DC

## 2021-09-02 NOTE — Progress Notes (Signed)
° ° °  Subjective:    Patient ID: [name], male    DOB: 05/05/1951, 71 y.o.   MRN: [id number]  HPI Here with his wife for a constellation of symptoms. For the past 3 days he has been working very hard on cutting up some trees on his property with a chainsaw and then burning them. He says he knows he did too much too quickly. He felt some pain in both arms and across his chest and shoulders for several days, but now this feels much better. No SOB. No coughing. At the same time he developed a fever that went up to 103 degrees 2 days ago. He has felt fatigued and he has had some intermittent lower abdominal pains. He has had increased urgency to urinate. No burning. No back pain or nausea. He is drinking lots of water. He had a UTI about 10 years ago, and he says this feels like that did then. He even had a few Bactrim pills leftover and he has taken 3 of these.    Review of Systems  Constitutional:  Positive for fatigue and fever. Negative for chills and diaphoresis.  HENT: Negative.    Eyes: Negative.   Respiratory: Negative.    Cardiovascular: Negative.   Gastrointestinal:  Positive for abdominal pain. Negative for abdominal distention, blood in stool, constipation, diarrhea, nausea and vomiting.  Genitourinary:  Positive for difficulty urinating and urgency. Negative for dysuria, flank pain and hematuria.      Objective:   Physical Exam Constitutional:      Appearance: Normal appearance. He is not ill-appearing.  Cardiovascular:     Rate and Rhythm: Normal rate and regular rhythm.     Pulses: Normal pulses.     Heart sounds: Normal heart sounds.  Pulmonary:     Effort: Pulmonary effort is normal.     Breath sounds: Normal breath sounds.  Abdominal:     General: Abdomen is flat. Bowel sounds are normal. There is no distension.     Palpations: Abdomen is soft. There is no mass.     Tenderness: There is no guarding or rebound.     Hernia: No hernia is present.     Comments:  Slightly tender above the pubis   Skin:    Findings: No erythema or rash.  Neurological:     General: No focal deficit present.     Mental Status: He is alert and oriented to person, place, and time.          Assessment & Plan:  His chest and arm pains were muscular pains that were the result of all the tree cutting and burning he has been doing. This does not sounds like a cardiac issue. This is costochondritis and it will resolve on ita own. Also he has another UTI, likely a prostatitis. We will send the sample for a culture. Treat with Bactrim DS for 10 days. We spent a total of ( 34  ) minutes reviewing records and discussing these issues.  Alysia Penna, MD

## 2021-09-03 LAB — URINE CULTURE
MICRO NUMBER:: 13068001
Result:: NO GROWTH
SPECIMEN QUALITY:: ADEQUATE

## 2021-09-06 ENCOUNTER — Other Ambulatory Visit: Payer: Self-pay | Admitting: Cardiovascular Disease

## 2021-09-17 ENCOUNTER — Ambulatory Visit: Payer: Medicare Other | Admitting: Internal Medicine

## 2021-09-17 ENCOUNTER — Other Ambulatory Visit: Payer: Self-pay

## 2021-09-17 ENCOUNTER — Ambulatory Visit (INDEPENDENT_AMBULATORY_CARE_PROVIDER_SITE_OTHER): Payer: Medicare Other | Admitting: Internal Medicine

## 2021-09-17 ENCOUNTER — Encounter: Payer: Self-pay | Admitting: Internal Medicine

## 2021-09-17 VITALS — BP 142/72 | HR 66 | Ht 69.0 in | Wt 217.0 lb

## 2021-09-17 DIAGNOSIS — E1159 Type 2 diabetes mellitus with other circulatory complications: Secondary | ICD-10-CM

## 2021-09-17 LAB — POCT GLYCOSYLATED HEMOGLOBIN (HGB A1C): Hemoglobin A1C: 7.6 % — AB (ref 4.0–5.6)

## 2021-09-17 NOTE — Patient Instructions (Signed)
-   Continue Glipizide 5 mg, 1 tablet before Breakfast and 1 tablet before supper - Continue Actos 45 mg daily  - Continue Farxiga 10 mg once a day     HOW TO TREAT LOW BLOOD SUGARS (Blood sugar LESS THAN 70 MG/DL) Please follow the RULE OF 15 for the treatment of hypoglycemia treatment (when your (blood sugars are less than 70 mg/dL)   STEP 1: Take 15 grams of carbohydrates when your blood sugar is low, which includes:  3-4 GLUCOSE TABS  OR 3-4 OZ OF JUICE OR REGULAR SODA OR ONE TUBE OF GLUCOSE GEL    STEP 2: RECHECK blood sugar in 15 MINUTES STEP 3: If your blood sugar is still low at the 15 minute recheck --> then, go back to STEP 1 and treat AGAIN with another 15 grams of carbohydrates.  

## 2021-09-17 NOTE — Progress Notes (Signed)
? ? ? ?Name: [name] ?  ?Age/ Sex: 71 y.o. ?male ?  ?MRN/ DOB: [id number]/ 06-Jun-1951 ? ?  ? ?PCP: Thomas Morale, MD ?  ?Reason for Endocrinology Evaluation: Type 2 Diabetes Mellitus  ?Initial Endocrine Consultative Visit: 08/31/2018  ? ? ?PATIENT IDENTIFIER: [name] is a 71 y.o. male with a past medical history of T2DM,CAD (S/P CABG). The patient has followed with Endocrinology clinic since 08/31/2018 for consultative assistance with management of his diabetes. ? ?DIABETIC HISTORY:  ?[name] was diagnosed with T2DM in 2010, he has been on Metformin and Actos for many years, he is intolerant to Victoza due to rash. Januvia has been ineffective , which he stopped 08/2018. He has never been on insulin . His hemoglobin A1c has ranged from 6.1% in 2017, peaking at 10.4% in 2019 ? ?On his initial presentation his A1c was 8.5% . He was on Metformin and Actos . We added [place]  ? ?Glipizide started 04/2020 ?Self stopped Metformin due to diarrhea 09/2020 ? ?SUBJECTIVE:  ? ?During the last visit (03/19/2021): A1c 7.5%. Continued Farxiga, and Actos and increased Glipizide  ? ? ?  ? ?Today (09/17/2021): [name] is here for a  follow up visit on his diabetes.He checks his sugar occasionally. He did not bring his meter today. He denies hypoglycemia since his last visit of here  ? ?Denies nausea, vomiting or diarrhea  ? ? ?HOME DIABETES REGIMEN:  ?Actos 45 mg daily  ?Farxiga 10 mg daily ?Glipizide 5 mg BID  ? ? ? ?GLUCOSE LOG : Did not bring  ? ? ? ?DIABETIC COMPLICATIONS: ?Microvascular complications:  ?Neuropathy  ?Denies: ckd , retinopathy  ?Last eye exam: Completed 2022 ? ? ? ?Macrovascular complications:  ?CAD ?Denies: PVD, CVA ?  ? ? ? ?HISTORY:  ?Past Medical History:  ?Past Medical History:  ?Diagnosis Date  ? CAD (coronary artery disease)   ? nuclear stress test-09/09/11 low risk scan EF61%, sees Dr. Claiborne Billings   ? Chronic headache   ? Chronic neck pain   ? Diabetes mellitus   ? sees Dr. Kelton Pillar   ? Hyperlipidemia   ? Hypertension 12/26/2008  ? ECHO- EF>55%  ? Insomnia   ? ?Past Surgical History:  ?Past Surgical History:  ?Procedure Laterality Date  ? APPENDECTOMY    ? cervical spine injection    ? COLONOSCOPY  04-02-06   ? per [facility], clear, repeat in 10 yrs  ? CORONARY ARTERY BYPASS GRAFT  2002  ? 4 vessels  ? ?Social History:  reports that he quit smoking about 50 years ago. His smoking use included cigarettes. He has a 2.50 pack-year smoking history. He has never used smokeless tobacco. He reports that he does not drink alcohol and does not use drugs. ?Family History:  ?Family History  ?Problem Relation Age of Onset  ? Liver cancer Father   ? Cancer Brother   ? Cancer Brother   ? Cancer Brother   ? Coronary artery disease Other   ? Hypertension Other   ? Heart disease Mother   ? Colon cancer Neg Hx   ? Stomach cancer Neg Hx   ? Esophageal cancer Neg Hx   ? ? ? ?HOME MEDICATIONS: ?Allergies as of 09/17/2021   ? ?   Reactions  ? Codeine Anaphylaxis  ? Pt. verbalized that he has taken and can take Hydrocodone, Oxycodone w/o issue - codeine allergy only  ? Crestor [rosuvastatin]   ? Myalgia   ? Isosorbide  Nitrate Other (See Comments)  ? Headaches  ? Lipitor [atorvastatin]   ? myalgia  ? Zolpidem Tartrate   ? REACTION: HA's  ? Ciprofloxacin Hives, Rash  ? Victoza [liraglutide] Rash  ? ?  ? ?  ?Medication List  ?  ? ?  ? Accurate as of September 17, 2021  2:41 PM. If you have any questions, ask your nurse or doctor.  ?  ?  ? ?  ? ?Accu-Chek Guide test strip ?Generic drug: glucose blood ?USE TO TEST BLOOD SUGAR EVERY DAY ?  ?Accu-Chek Softclix Lancets lancets ?USE TO CHECK BLOOD SUGAR ONCE DAILY ?  ?amLODipine 5 MG tablet ?Commonly known as: NORVASC ?Take 1.5 tablets (7.5 mg total) by mouth daily. ?  ?aspirin 81 MG tablet ?Take 81 mg by mouth daily. ?  ?blood glucose meter kit and supplies ?Dispense based on patient and insurance preference. . (FOR ICD-10 E10.9, E11.9). Check blood sugar daily ?  ?CALCIUM 600+D  PO ?Take 1 tablet by mouth daily. ?  ?clonazePAM 0.5 MG tablet ?Commonly known as: KLONOPIN ?TAKE 1 TABLET BY MOUTH TWICE A DAY ?  ?CoQ10 100 MG Caps ?Take 100 mg by mouth daily. ?  ?dapagliflozin propanediol 10 MG Tabs tablet ?Commonly known as: [place] ?Take 1 tablet (10 mg total) by mouth daily. ?  ?fexofenadine 180 MG tablet ?Commonly known as: ALLEGRA ?Take 180 mg by mouth daily as needed for allergies or rhinitis. ?  ?gabapentin 300 MG capsule ?Commonly known as: NEURONTIN ?TAKE 3 CAPSULES BY MOUTH AT BEDTIME ?  ?glipiZIDE 5 MG tablet ?Commonly known as: GLUCOTROL ?Take 1 tablet (5 mg total) by mouth 2 (two) times daily before a meal. ?  ?isosorbide mononitrate 30 MG 24 hr tablet ?Commonly known as: IMDUR ?  ?lisinopril 40 MG tablet ?Commonly known as: ZESTRIL ?TAKE 1 TABLET BY MOUTH EVERY DAY ?  ?nebivolol 10 MG tablet ?Commonly known as: BYSTOLIC ?TAKE 1/2 TABLET BY MOUTH DAILY ?  ?Omega-3 350 MG Cpdr ?Take 350 mg by mouth daily. ?  ?pioglitazone 45 MG tablet ?Commonly known as: ACTOS ?Take 1 tablet (45 mg total) by mouth daily. ?  ?sildenafil 100 MG tablet ?Commonly known as: Viagra ?Take 1 tablet (100 mg total) by mouth daily as needed for erectile dysfunction. ?  ?sulfamethoxazole-trimethoprim 800-160 MG tablet ?Commonly known as: BACTRIM DS ?Take 1 tablet by mouth 2 (two) times daily. ?  ?tamsulosin 0.4 MG Caps capsule ?Commonly known as: FLOMAX ?Take 1 capsule (0.4 mg total) by mouth daily. ?  ? ?  ? ?PHYSICAL EXAM: ?VS: BP (!) 142/72   Pulse 66   Ht 5' 9"  (1.753 m)   Wt 217 lb (98.4 kg)   SpO2 98%   BMI 32.05 kg/m?   ? ?EXAM: ?General: Pt appears well and is in NAD  ?Lungs: Clear with good BS bilat with no rales, rhonchi, or wheezes  ?Heart: Auscultation: RRR with normal S1 and S2, no gallops or murmurs  ?Abdomen: Normoactive bowel sounds, soft, nontender, without masses or organomegaly palpable  ?Extremities: BL LE: no pretibial edema normal ROM and strength, no joint enlargement or tenderness   ?Mental Status: Judgment, insight: intact ?Mood and affect: no depression, anxiety, or agitation  ? ? ? ?DM Foot Exam 09/17/2021 ?The skin of the feet is intact without sores or ulcerations. ?The pedal pulses are 2+ on right and 2+ on left. ?The sensation is intact to a screening 5.07, 10 gram monofilament bilaterally ? ? ?DATA REVIEWED: ? ?Lab Results  ?Component  Value Date  ? HGBA1C 7.6 (A) 09/17/2021  ? HGBA1C 7.5 (A) 03/19/2021  ? HGBA1C 8.1 (A) 11/11/2020  ? ? ? ?Lab Results  ?Component Value Date  ? CHOL 180 08/05/2021  ? HDL 42.90 08/05/2021  ? LDLCALC 117 (H) 08/05/2021  ? LDLDIRECT 128.0 10/29/2011  ? TRIG 102.0 08/05/2021  ? CHOLHDL 4 08/05/2021  ?     ? Latest Reference Range & Units 08/05/21 08:45  ?Sodium 135 - 145 mEq/L 142  ?Potassium 3.5 - 5.1 mEq/L 3.9  ?Chloride 96 - 112 mEq/L 106  ?CO2 19 - 32 mEq/L 28  ?Glucose 70 - 99 mg/dL 119 (H)  ?BUN 6 - 23 mg/dL 19  ?Creatinine 0.40 - 1.50 mg/dL 1.05  ?Calcium 8.4 - 10.5 mg/dL 9.4  ?Alkaline Phosphatase 39 - 117 U/L 82  ?Albumin 3.5 - 5.2 g/dL 4.3  ?AST 0 - 37 U/L 22  ?ALT 0 - 53 U/L 18  ?Total Protein 6.0 - 8.3 g/dL 7.1  ?Bilirubin, Direct 0.0 - 0.3 mg/dL 0.1  ?Total Bilirubin 0.2 - 1.2 mg/dL 0.4  ?GFR >60.00 mL/min 71.25  ? ? ?ASSESSMENT / PLAN / RECOMMENDATIONS:  ? ?1) Type 2 Diabetes Mellitus, Acceptable control, With neuropathic and macrovascular complications - Most recent A1c of 7.6  %. Goal A1c < 7.5 %.   ? ? ?- A1c stable  ?- Intolerant to Metformin due to diarrhea  ?- NO changes  ? ? ?MEDICATIONS: ?Continue  Glipizide 5 mg , 1 tablet twice a day ?Continue Farxiga 10 mg daily ?Continue Actos 45 mg daily  ? ?EDUCATION / INSTRUCTIONS: ?BG monitoring instructions: Patient is instructed to check his blood sugars 1 times a day, fasting . ?Call [facility] if: BG persistently < 70  ?I reviewed the Rule of 15 for the treatment of hypoglycemia in detail with the patient. Literature supplied. ? ?  ?2) Diabetic complications:  ?Eye: Does not  have known diabetic retinopathy.  ?Neuro/ Feet: Does have known diabetic peripheral neuropathy. ?Renal: Patient does not have known baseline CKD. He is on an ACEI/ARB at present. ?  ? ?F/U in 6 months  ? ? ?

## 2021-10-03 ENCOUNTER — Telehealth: Payer: Self-pay | Admitting: Family Medicine

## 2021-10-03 ENCOUNTER — Other Ambulatory Visit: Payer: Self-pay | Admitting: Family Medicine

## 2021-10-03 NOTE — Telephone Encounter (Signed)
[name] pt wife is calling and he is still having issue with his bladder. Pt was last seen 09-02-2021 for uti and pt is going out of town this Sunday. Pt wife said sulfamethoxazole-trimethoprim (BACTRIM DS) 800-160 MG tablet is not working and would like something else sent to pharm. I told wife he may need an appt and she said he was just seen. I told her it was over a month ago ?[id number] [place], [place] - [address] Phone:  [phone]  ?Fax:  [phone]  ?  ? ?

## 2021-10-07 ENCOUNTER — Other Ambulatory Visit: Payer: Self-pay

## 2021-10-07 DIAGNOSIS — N390 Urinary tract infection, site not specified: Secondary | ICD-10-CM

## 2021-10-07 MED ORDER — DOXYCYCLINE HYCLATE 100 MG PO TABS: ORAL_TABLET | ORAL | 0 refills | Status: DC

## 2021-10-07 NOTE — Progress Notes (Signed)
doxy 

## 2021-10-07 NOTE — Telephone Encounter (Signed)
Spoke with pt wife [name] to make aware Doxycycline 100mg  has been sent to [facility] on [address].  ?

## 2021-10-07 NOTE — Telephone Encounter (Signed)
Call in Doxycycline 100 mg BID for 10 days  

## 2021-10-13 ENCOUNTER — Ambulatory Visit (INDEPENDENT_AMBULATORY_CARE_PROVIDER_SITE_OTHER): Payer: Medicare Other

## 2021-10-13 VITALS — Ht 69.0 in | Wt 205.0 lb

## 2021-10-13 DIAGNOSIS — Z0000 Encounter for general adult medical examination without abnormal findings: Secondary | ICD-10-CM

## 2021-10-13 NOTE — Patient Instructions (Addendum)
?[name] , ?Thank you for taking time to come for your Medicare Wellness Visit. I appreciate your ongoing commitment to your health goals. Please review the following plan we discussed and let me know if I can assist you in the future.  ? ?These are the goals we discussed: ? Goals   ? ?   Exercise 150 min/wk Moderate Activity   ?   Patient Stated (pt-stated)   ?   I would like to continue to live for the Lord. ?  ? ?  ?  ?This is a list of the screening recommended for you and due dates:  ?Health Maintenance  ?Topic Date Due  ? Complete foot exam   10/24/2020  ? Eye exam for diabetics  10/13/2021*  ? COVID-19 Vaccine (1) 10/29/2021*  ? Zoster (Shingles) Vaccine (1 of 2) 11/02/2021*  ? Pneumonia Vaccine (1 - PCV) 08/05/2022*  ? Colon Cancer Screening  08/05/2022*  ? Hepatitis C Screening: USPSTF Recommendation to screen - Ages 18-79 yo.  10/14/2022*  ? Flu Shot  02/03/2022  ? Hemoglobin A1C  03/20/2022  ? Tetanus Vaccine  04/01/2023  ? HPV Vaccine  Aged Out  ?*Topic was postponed. The date shown is not the original due date.  ? ? ?Advanced directives: Yes ? ?Conditions/risks identified: None ? ?Next appointment: Follow up in one year for your annual wellness visit.  ? ?Preventive Care 41 Years and Older, Male ?Preventive care refers to lifestyle choices and visits with your health care provider that can promote health and wellness. ?What does preventive care include? ?A yearly physical exam. This is also called an annual well check. ?Dental exams once or twice a year. ?Routine eye exams. Ask your health care provider how often you should have your eyes checked. ?Personal lifestyle choices, including: ?Daily care of your teeth and gums. ?Regular physical activity. ?Eating a healthy diet. ?Avoiding tobacco and drug use. ?Limiting alcohol use. ?Practicing safe sex. ?Taking low doses of aspirin every day. ?Taking vitamin and mineral supplements as recommended by your health care provider. ?What happens during an  annual well check? ?The services and screenings done by your health care provider during your annual well check will depend on your age, overall health, lifestyle risk factors, and family history of disease. ?Counseling  ?Your health care provider may ask you questions about your: ?Alcohol use. ?Tobacco use. ?Drug use. ?Emotional well-being. ?Home and relationship well-being. ?Sexual activity. ?Eating habits. ?History of falls. ?Memory and ability to understand (cognition). ?Work and work Astronomer. ?Screening  ?You may have the following tests or measurements: ?Height, weight, and BMI. ?Blood pressure. ?Lipid and cholesterol levels. These may be checked every 5 years, or more frequently if you are over 58 years old. ?Skin check. ?Lung cancer screening. You may have this screening every year starting at age 78 if you have a 30-pack-year history of smoking and currently smoke or have quit within the past 15 years. ?Fecal occult blood test (FOBT) of the stool. You may have this test every year starting at age 22. ?Flexible sigmoidoscopy or colonoscopy. You may have a sigmoidoscopy every 5 years or a colonoscopy every 10 years starting at age 1. ?Prostate cancer screening. Recommendations will vary depending on your family history and other risks. ?Hepatitis C blood test. ?Hepatitis B blood test. ?Sexually transmitted disease (STD) testing. ?Diabetes screening. This is done by checking your blood sugar (glucose) after you have not eaten for a while (fasting). You may have this done every 1-3 years. ?Abdominal  aortic aneurysm (AAA) screening. You may need this if you are a current or former smoker. ?Osteoporosis. You may be screened starting at age 63 if you are at high risk. ?Talk with your health care provider about your test results, treatment options, and if necessary, the need for more tests. ?Vaccines  ?Your health care provider may recommend certain vaccines, such as: ?Influenza vaccine. This is recommended  every year. ?Tetanus, diphtheria, and acellular pertussis (Tdap, Td) vaccine. You may need a Td booster every 10 years. ?Zoster vaccine. You may need this after age 42. ?Pneumococcal 13-valent conjugate (PCV13) vaccine. One dose is recommended after age 68. ?Pneumococcal polysaccharide (PPSV23) vaccine. One dose is recommended after age 73. ?Talk to your health care provider about which screenings and vaccines you need and how often you need them. ?This information is not intended to replace advice given to you by your health care provider. Make sure you discuss any questions you have with your health care provider. ?Document Released: 07/19/2015 Document Revised: 03/11/2016 Document Reviewed: 04/23/2015 ?Elsevier Interactive Patient Education ? 2017 [employer]. ? ?Fall Prevention in the Home ?Falls can cause injuries. They can happen to people of all ages. There are many things you can do to make your home safe and to help prevent falls. ?What can I do on the outside of my home? ?Regularly fix the edges of walkways and driveways and fix any cracks. ?Remove anything that might make you trip as you walk through a door, such as a raised step or threshold. ?Trim any bushes or trees on the path to your home. ?Use bright outdoor lighting. ?Clear any walking paths of anything that might make someone trip, such as rocks or tools. ?Regularly check to see if handrails are loose or broken. Make sure that both sides of any steps have handrails. ?Any raised decks and porches should have guardrails on the edges. ?Have any leaves, snow, or ice cleared regularly. ?Use sand or salt on walking paths during winter. ?Clean up any spills in your garage right away. This includes oil or grease spills. ?What can I do in the bathroom? ?Use night lights. ?Install grab bars by the toilet and in the tub and shower. Do not use towel bars as grab bars. ?Use non-skid mats or decals in the tub or shower. ?If you need to sit down in the shower,  use a plastic, non-slip stool. ?Keep the floor dry. Clean up any water that spills on the floor as soon as it happens. ?Remove soap buildup in the tub or shower regularly. ?Attach bath mats securely with double-sided non-slip rug tape. ?Do not have throw rugs and other things on the floor that can make you trip. ?What can I do in the bedroom? ?Use night lights. ?Make sure that you have a light by your bed that is easy to reach. ?Do not use any sheets or blankets that are too big for your bed. They should not hang down onto the floor. ?Have a firm chair that has side arms. You can use this for support while you get dressed. ?Do not have throw rugs and other things on the floor that can make you trip. ?What can I do in the kitchen? ?Clean up any spills right away. ?Avoid walking on wet floors. ?Keep items that you use a lot in easy-to-reach places. ?If you need to reach something above you, use a strong step stool that has a grab bar. ?Keep electrical cords out of the way. ?Do not use  floor polish or wax that makes floors slippery. If you must use wax, use non-skid floor wax. ?Do not have throw rugs and other things on the floor that can make you trip. ?What can I do with my stairs? ?Do not leave any items on the stairs. ?Make sure that there are handrails on both sides of the stairs and use them. Fix handrails that are broken or loose. Make sure that handrails are as long as the stairways. ?Check any carpeting to make sure that it is firmly attached to the stairs. Fix any carpet that is loose or worn. ?Avoid having throw rugs at the top or bottom of the stairs. If you do have throw rugs, attach them to the floor with carpet tape. ?Make sure that you have a light switch at the top of the stairs and the bottom of the stairs. If you do not have them, ask someone to add them for you. ?What else can I do to help prevent falls? ?Wear shoes that: ?Do not have high heels. ?Have rubber bottoms. ?Are comfortable and fit you  well. ?Are closed at the toe. Do not wear sandals. ?If you use a stepladder: ?Make sure that it is fully opened. Do not climb a closed stepladder. ?Make sure that both sides of the stepladder are locked into

## 2021-10-13 NOTE — Progress Notes (Signed)
? ?Subjective:  ? [name] is a 71 y.o. male who presents for Medicare Annual/Subsequent preventive examination. ? ?Review of Systems    ?Virtual Visit via Telephone Note ? ?I connected with  [name] on 10/13/21 at  9:00 AM EDT by telephone and verified that I am speaking with the correct person using two identifiers. ? ?Location: ?Patient: Home ?Provider: Office ?Persons participating in the virtual visit: patient/Nurse Health Advisor ?  ?I discussed the limitations, risks, security and privacy concerns of performing an evaluation and management service by telephone and the availability of in person appointments. The patient expressed understanding and agreed to proceed. ? ?Interactive audio and video telecommunications were attempted between this nurse and patient, however failed, due to patient having technical difficulties OR patient did not have access to video capability.  We continued and completed visit with audio only. ? ?Some vital signs may be absent or patient reported.  ? ?Criselda Peaches, LPN  ?Cardiac Risk Factors include: advanced age (>59mn, >>60women);diabetes mellitus;male gender;hypertension ? ?   ?Objective:  ?  ?Today's Vitals  ? 10/13/21 0849  ?Weight: 205 lb (93 kg)  ?Height: 5' 9"  (1.753 m)  ? ?Body mass index is 30.27 kg/m?. ? ? ?  10/13/2021  ?  9:02 AM 08/07/2020  ? 11:39 AM 02/18/2018  ?  9:53 AM 06/17/2016  ? 12:54 PM  ?Advanced Directives  ?Does Patient Have a Medical Advance Directive? Yes No No No  ?Type of [place]     ?Does patient want to make changes to medical advance directive? No - Patient declined     ?Copy of [place]? No - copy requested     ?Would patient like information on creating a medical advance directive?   Yes (MAU/Ambulatory/Procedural Areas - Information given)   ? ? ?Current Medications (verified) ?Outpatient Encounter Medications as of 10/13/2021  ?Medication Sig  ?  doxycycline (VIBRA-TABS) 100 MG tablet Take 1 tablet by mouth 2 times daily for 10 days.  ? ACCU-CHEK GUIDE test strip USE TO TEST BLOOD SUGAR EVERY DAY  ? Accu-Chek Softclix Lancets lancets USE TO CHECK BLOOD SUGAR ONCE DAILY  ? amLODipine (NORVASC) 5 MG tablet Take 1.5 tablets (7.5 mg total) by mouth daily.  ? aspirin 81 MG tablet Take 81 mg by mouth daily.  ? blood glucose meter kit and supplies Dispense based on patient and insurance preference. . (FOR ICD-10 E10.9, E11.9). Check blood sugar daily  ? Calcium Carbonate-Vitamin D (CALCIUM 600+D PO) Take 1 tablet by mouth daily.  ? clonazePAM (KLONOPIN) 0.5 MG tablet TAKE 1 TABLET BY MOUTH TWICE A DAY  ? Coenzyme Q10 (COQ10) 100 MG CAPS Take 100 mg by mouth daily.   ? dapagliflozin propanediol (FARXIGA) 10 MG TABS tablet Take 1 tablet (10 mg total) by mouth daily.  ? fexofenadine (ALLEGRA) 180 MG tablet Take 180 mg by mouth daily as needed for allergies or rhinitis.  ? gabapentin (NEURONTIN) 300 MG capsule TAKE 3 CAPSULES BY MOUTH AT BEDTIME  ? glipiZIDE (GLUCOTROL) 5 MG tablet Take 1 tablet (5 mg total) by mouth 2 (two) times daily before a meal.  ? isosorbide mononitrate (IMDUR) 30 MG 24 hr tablet   ? lisinopril (ZESTRIL) 40 MG tablet TAKE 1 TABLET BY MOUTH EVERY DAY  ? nebivolol (BYSTOLIC) 10 MG tablet TAKE 1/2 TABLET BY MOUTH DAILY  ? Omega-3 350 MG CPDR Take 350 mg by mouth daily.  ?  pioglitazone (ACTOS) 45 MG tablet Take 1 tablet (45 mg total) by mouth daily.  ? sildenafil (VIAGRA) 100 MG tablet Take 1 tablet (100 mg total) by mouth daily as needed for erectile dysfunction.  ? sulfamethoxazole-trimethoprim (BACTRIM DS) 800-160 MG tablet Take 1 tablet by mouth 2 (two) times daily.  ? tamsulosin (FLOMAX) 0.4 MG CAPS capsule Take 1 capsule (0.4 mg total) by mouth daily.  ? ?No facility-administered encounter medications on file as of 10/13/2021.  ? ? ?Allergies (verified) ?Codeine, Crestor [rosuvastatin], Isosorbide nitrate, Lipitor [atorvastatin], Zolpidem  tartrate, Ciprofloxacin, and Victoza [liraglutide]  ? ?History: ?Past Medical History:  ?Diagnosis Date  ? CAD (coronary artery disease)   ? nuclear stress test-09/09/11 low risk scan EF61%, sees Dr. Claiborne Billings   ? Chronic headache   ? Chronic neck pain   ? Diabetes mellitus   ? sees Dr. Kelton Pillar  ? Hyperlipidemia   ? Hypertension 12/26/2008  ? ECHO- EF>55%  ? Insomnia   ? ?Past Surgical History:  ?Procedure Laterality Date  ? APPENDECTOMY    ? cervical spine injection    ? COLONOSCOPY  04-02-06   ? per [facility], clear, repeat in 10 yrs  ? CORONARY ARTERY BYPASS GRAFT  2002  ? 4 vessels  ? ?Family History  ?Problem Relation Age of Onset  ? Liver cancer Father   ? Cancer Brother   ? Cancer Brother   ? Cancer Brother   ? Coronary artery disease Other   ? Hypertension Other   ? Heart disease Mother   ? Colon cancer Neg Hx   ? Stomach cancer Neg Hx   ? Esophageal cancer Neg Hx   ? ?Social History  ? ?Socioeconomic History  ? Marital status: Married  ?  Spouse name: Not on file  ? Number of children: Not on file  ? Years of education: Not on file  ? Highest education level: Not on file  ?Occupational History  ? Not on file  ?Tobacco Use  ? Smoking status: Former  ?  Packs/day: 0.25  ?  Years: 10.00  ?  Pack years: 2.50  ?  Types: Cigarettes  ?  Quit date: 11/26/1970  ?  Years since quitting: 50.9  ? Smokeless tobacco: Never  ?Vaping Use  ? Vaping Use: Never used  ?Substance and Sexual Activity  ? Alcohol use: No  ?  Alcohol/week: 0.0 standard drinks  ? Drug use: No  ? Sexual activity: Not on file  ?Other Topics Concern  ? Not on file  ?Social History Narrative  ? Not on file  ? ?Social Determinants of Health  ? ?Financial Resource Strain: Low Risk   ? Difficulty of Paying Living Expenses: Not hard at all  ?Food Insecurity: No Food Insecurity  ? Worried About Charity fundraiser in the Last Year: Never true  ? Ran Out of Food in the Last Year: Never true  ?Transportation Needs: No Transportation Needs  ? Lack of  Transportation (Medical): No  ? Lack of Transportation (Non-Medical): No  ?Physical Activity: Sufficiently Active  ? Days of Exercise per Week: 5 days  ? Minutes of Exercise per Session: 30 min  ?Stress: No Stress Concern Present  ? Feeling of Stress : Not at all  ?Social Connections: Socially Integrated  ? Frequency of Communication with Friends and Family: More than three times a week  ? Frequency of Social Gatherings with Friends and Family: More than three times a week  ? Attends Religious Services: More than 4  times per year  ? Active Member of Clubs or Organizations: Yes  ? Attends Archivist Meetings: More than 4 times per year  ? Marital Status: Married  ? ? ? ?Clinical Intake: ?Nutrition Risk Assessment: ? ?Has the patient had any N/V/D within the last 2 months?  No  ?Does the patient have any non-healing wounds?  No  ?Has the patient had any unintentional weight loss or weight gain?  No  ? ?Diabetes: ? ?Is the patient diabetic?  Yes  ?If diabetic, was a CBG obtained today?  No  ?Did the patient bring in their glucometer from home?  No  Audio Visit ?How often do you monitor your CBG's? Daily.  ? ?Financial Strains and Diabetes Management: ? ?Are you having any financial strains with the device, your supplies or your medication? No .  ?Does the patient want to be seen by Chronic Care Management for management of their diabetes?  No  ?Would the patient like to be referred to a Nutritionist or for Diabetic Management?  No  ? ?Diabetic Exams: ? ?Diabetic Eye Exam: Completed Yes. Overdue for diabetic eye exam. Pt has been advised about the importance in completing this exam. A referral has been placed today. Message sent to referral coordinator for scheduling purposes. Advised pt to expect a call from office referred to regarding appt. ? ?Diabetic Foot Exam: Completed Yes. Pt has been advised about the importance in completing this exam. Pt is scheduled for diabetic foot exam on Followed by PCP.    ?Pre-visit preparation completed: No ? ? ?Diabetic?  Yes ? ?Interpreter Needed?: NoActivities of Daily Living ? ?  10/13/2021  ?  9:01 AM  ?In your present state of health, do you have any difficulty performing the followi

## 2021-10-20 ENCOUNTER — Ambulatory Visit (INDEPENDENT_AMBULATORY_CARE_PROVIDER_SITE_OTHER): Payer: Medicare Other | Admitting: Cardiovascular Disease

## 2021-10-20 ENCOUNTER — Encounter: Payer: Self-pay | Admitting: Cardiovascular Disease

## 2021-10-20 DIAGNOSIS — I25709 Atherosclerosis of coronary artery bypass graft(s), unspecified, with unspecified angina pectoris: Secondary | ICD-10-CM

## 2021-10-20 DIAGNOSIS — Z79899 Other long term (current) drug therapy: Secondary | ICD-10-CM

## 2021-10-20 DIAGNOSIS — R079 Chest pain, unspecified: Secondary | ICD-10-CM

## 2021-10-20 DIAGNOSIS — I10 Essential (primary) hypertension: Secondary | ICD-10-CM | POA: Diagnosis not present

## 2021-10-20 DIAGNOSIS — Z8616 Personal history of COVID-19: Secondary | ICD-10-CM

## 2021-10-20 DIAGNOSIS — Z951 Presence of aortocoronary bypass graft: Secondary | ICD-10-CM

## 2021-10-20 DIAGNOSIS — E785 Hyperlipidemia, unspecified: Secondary | ICD-10-CM | POA: Diagnosis not present

## 2021-10-20 MED ORDER — EZETIMIBE 10 MG PO TABS: 10.0000 mg | ORAL_TABLET | Freq: Every day | ORAL | 3 refills | Status: DC

## 2021-10-20 MED ORDER — NITROGLYCERIN 0.4 MG SL SUBL: 0.4000 mg | SUBLINGUAL_TABLET | SUBLINGUAL | 6 refills | Status: DC | PRN

## 2021-10-20 MED ORDER — AMLODIPINE BESYLATE 10 MG PO TABS: 10.0000 mg | ORAL_TABLET | Freq: Every day | ORAL | 3 refills | Status: DC

## 2021-10-20 NOTE — Progress Notes (Signed)
Patient ID: [name], male   DOB: 1951/02/02, 71 y.o.   MRN: [id number] ? ? ? ? ? ?Primary M.D.: Dr. Alysia Penna ? ?HPI: [name] is a 71 y.o. male presents to the office today for a 14 month follow up cardiology evaluation.  ? ?[name] has CAD and in June 2002 underwent CABG surgery by Dr. Roxan Hockey with a sequential LIMA graft to the second diagonal and LAD, and sequential radial graft to the first and third diagonal branches. An echo Doppler study in 2010 showed normal systolic function with mild MR and trace TR.  A  nuclear perfusion study in March 2013 showed normal perfusion and function without scar or ischemia. ? ?Additional problems include hypertension, type 2 diabetes mellitus, and hyperlipidemia. Remotely, he had worked as a Garment/textile technologist minor and in the past was some question of possible black lung. 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 ? ?In November 2015 he complained of a slight change in symptomatology with exertional shortness of breath and some arm weakness.  Prior to bypass surgery, he had never experienced chest pain but demonstrated exertional shortness of breath and arm discomfort.  He admitted to fatigue.  He denied any major episodes of chest pressure. ? ?A 2-D echo Doppler study on 06/14/2014 showed an ejection fraction of 55-60% with mild focal basal hypertrophy of the septum; grade 1 diastolic dysfunction.  There was evidence for mild mitral regurgitation and moderate left atrial dilatation.  A nuclear perfusion study on December 11,2015 was interpreted as low risk.  He developed 1-2 mm of horizontal ST segment depression and had normal scintigraphic myocardial perfusion images.  Ejection fraction was 57%.  There was normal LV function without wall motion abnormality. ? ?He has a history of hyperlipidemia and remotely self increased his simvastatin from 40 mg to 80 mg and developed occasional myalgias.   He has been on Bystolic 10 mg and lisinopril 30 mg for his hypertension.  He is on metformin 1000 mg twice a day as well as DiaBeta 5 mg for his type 2 diabetes mellitus. ? ?When I saw him in October 2018, he had stopped taking Livalo for approximately one month.  He had been busy doing home projects and denied any recurrent chest pain.  During his last evaluation.  I had a lengthy discussion with him and recommended another attempt at a trial of Livalo.  I also added supplemental coenzyme Q 10.  Over this time period, he was able to reinitiate 2 mg daily and has been able to tolerate this without myalgias.  Subsequent blood work did show improvement in his lipid status on 08/04/2017 with a total cholesterol 149, LDL 82.  Triglycerides 148, and HDL 37.  He has continued to be on Januvia and metformin for his diabetes mellitus.  Hypertension.  He has remained on amlodipine 5 mg, lisinopril 30 mg, and Bystolic 10 mg.  ? ?When evaluated in March 2019 he was tolerating Livalo and I recommended further titration to 4 mg daily and attempt to obtain an LDL less than 70.  Since I saw him, he has had issues with poorly controlled diabetes mellitus.  Apparently he was started on farziga but developed some dizziness secondary to this.  He is now on metformin, Januvia, and was started on Actos.  Earlier this year he had a fall off a ladder.  ? ?I saw him in October 2019.  He required rotator  cuff surgery on his right shoulder.  His surgery was delayed due to initial glucose issues.  Since it is been over 17 years since his CABG revascularization, I recommended that he undergo a preoperative exercise Myoview study for preoperative assessment and clearance.  He underwent a nuclear perfusion study on May 05, 2018.  This was interpreted as low risk with normal perfusion and mildly reduced left ventricular global systolic function with an EF of 48%.  He developed horizontal ST segment depression of 1.5 mm during stress which  returned to baseline at 5 to 9 minutes into the recovery period.  When compared to his prior nuclear perfusion study of 2015 this was not significantly changed and on that study he also had normal perfusion with positive ECG changes.  A subsequent echo Doppler study on May 30, 2018 showed an EF of 55 to 60%.  There was severe LVH.  There was grade 1 diastolic dysfunction with mild MR and mild LA dilation.  He denies any chest pain.  He has had periods of increased cough following prolonged coughs he may feel somewhat atypical chest pain discomfort.  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 Kendrick Fries. ? ?He had his pulmonary evaluation with Dr. 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 ? ?I saw him in March 2021.  Over the prior year he had remained stable from a cardiovascular standpoint.  He was not having anginal symptomatology 18-1/2 years following his CABG revascularization.  He developed Covid infection and was very weak and fatigued for 5 days.  In addition his son died in 2020/06/09after he developed a seizure while driving a motorcycle and was around 72 years old.  In addition, his sister died at age 37 secondary to cancer.  He continues to be on amlodipine 5 mg, isosorbide 30 mg, lisinopril 40 mg and Bystolic 10 mg for hypertension.  He is on Zetia and rosuvastatin 10 mg for hyperlipidemia in addition to omega-3 fatty acids.  He is diabetic on Metformin, Farxiga, and Actos.   ? ?He was  evaluated by me in August 2021.  [name] has done  fairly well.  However, he admits to experiencing some episodes of chest pressure over the past 6 to 8 weeks.  Mostly this occurs without exertion and oftentimes occurs when he goes more in a reclining position when on his recliner chair.  However, he has noticed some vague symptomatology with walking.  However he walks his dogs regularly and often runs with them and denies any chest pressure when he is running with his dogs.  He does note some mild lightheadedness when he bends over and its up abruptly.  He is unaware of palpitations.  He underwent an echo Doppler study in February 05, 2020 which showed an EF of 55 to 60% and grade 2 diastolic dysfunction.  There was mild mitral gravitation, and mild aortic valve sclerosis.  The ascending aorta measured 38 mm.  Laboratory in April 2021 showed an LDL of 71 on Zetia and rosuvastatin.  He continues to be  on Metformin, Farxiga, and Actos for his diabetes mellitus.   ? ?He underwent a nuclear perfusion study for evaluation of his chest pain which was done on April 05, 2020.  This was low risk and showed an EF at 55%.  It was very mild ST depression at peak infusion without any definitive perfusion defect on nuclear imaging. ? ?I last saw him on August 15, 2020 at which time he felt well and denied any recurrent chest pain.  He continued  to be on amlodipine 5 mg, lisinopril 40 mg, isosorbide 30 mg, and Bystolic 5 mg for his hypertension and CAD.  He is diabetic on Farxiga, and glipizide in addition to Actos and recently he stopped taking Metformin.  He has felt improved off Metformin treatment.  He was on Zetia for hyperlipidemia.  He has not had recent laboratory.  During that evaluation, his blood pressure was elevated and I recommended slight titration of amlodipine from 5 mg up to 7.5 mg and recommended he continue his current regimen of isosorbide, lisinopril and nebivolol.  He was no longer on metformin but was on Farxiga in addition to glipizide and Actos for his  diabetes mellitus. ? ?Since I last saw him, he had developed COVID and had a long COVID syndrome.  Recently, he has had recurrent problems with prostate infections and has been on antibiotics.  He recentl

## 2021-10-20 NOTE — Patient Instructions (Signed)
Medication Instructions:  ?INCREASE AMLODIPINE 10MG  DAILY ? ?RE-START ZETIA  ? ?TAKE NITROGLYCERIN WHEN NEEDED ? ?*If you need a refill on your cardiac medications before your next appointment, please call your pharmacy* ? ?Lab Work:    ?IN 3 WEEKS FASTING LIPID, CMET AND LPa     ?If you have labs (blood work) drawn today and your tests are completely normal, you will receive your results only by: ?MyChart Message (if you have MyChart) OR  A paper copy in the mail ?If you have any lab test that is abnormal or we need to change your treatment, we will call you to review the results. ? ?Testing/Procedures:  ?Your physician has requested that you have a , is a chemical stress test. Please follow instruction sheet, as given.  The Myoview Stress Test is a diagnostic test to evaluate/detect the presence of early heart disease, to assess your functional capacity, or to update the status of your coronary circulation following a cardiac event. ? ?Follow-Up: ?Your next appointment:  4 week(s) In Person with [employer], MD    ? ?At [facility], you and your health needs are our priority.  As part of our continuing mission to provide you with exceptional heart care, we have created designated Provider Care Teams.  These Care Teams include your primary Cardiologist (physician) and Advanced Practice Providers (APPs -  Physician Assistants and Nurse Practitioners) who all work together to provide you with the care you need, when you need it. ? ? ? ?Important Information About Sugar ? ? ? ? ? ? ? ?  ? ?

## 2021-10-21 DIAGNOSIS — E119 Type 2 diabetes mellitus without complications: Secondary | ICD-10-CM | POA: Diagnosis not present

## 2021-10-21 LAB — HM DIABETES EYE EXAM

## 2021-10-22 ENCOUNTER — Telehealth (HOSPITAL_COMMUNITY): Payer: Self-pay | Admitting: *Deleted

## 2021-10-22 NOTE — Telephone Encounter (Signed)
Close encounter 

## 2021-10-23 ENCOUNTER — Ambulatory Visit (HOSPITAL_COMMUNITY)
Admission: RE | Admit: 2021-10-23 | Discharge: 2021-10-23 | Disposition: A | Discharge status: Home / Self Care | Payer: Medicare Other | Source: Ambulatory Visit | Attending: Cardiology | Admitting: Cardiology

## 2021-10-23 ENCOUNTER — Encounter: Payer: Self-pay | Admitting: Cardiovascular Disease

## 2021-10-23 DIAGNOSIS — R079 Chest pain, unspecified: Secondary | ICD-10-CM | POA: Diagnosis not present

## 2021-10-23 DIAGNOSIS — Z951 Presence of aortocoronary bypass graft: Secondary | ICD-10-CM | POA: Diagnosis not present

## 2021-10-23 LAB — MYOCARDIAL PERFUSION IMAGING
Base ST Depression (mm): 0 mm
LV dias vol: 174 mL (ref 62–150)
LV sys vol: 97 mL
Nuc Stress EF: 44 %
Peak HR: 79 {beats}/min
Rest HR: 49 {beats}/min
Rest Nuclear Isotope Dose: 11 mCi
SDS: 3
SRS: 5
SSS: 8
ST Depression (mm): 0 mm
Stress Nuclear Isotope Dose: 32.5 mCi
TID: 1.12

## 2021-10-23 MED ORDER — REGADENOSON 0.4 MG/5ML IV SOLN
0.4000 mg | Freq: Once | INTRAVENOUS | Status: AC
  Administered 2021-10-23: 0.4 mg via INTRAVENOUS

## 2021-10-23 MED ORDER — TECHNETIUM TC 99M TETROFOSMIN IV KIT
32.5000 | PACK | Freq: Once | INTRAVENOUS | Status: AC | PRN
  Administered 2021-10-23: 32.5 via INTRAVENOUS
  Filled 2021-10-23: qty 33

## 2021-10-23 MED ORDER — TECHNETIUM TC 99M TETROFOSMIN IV KIT
11.0000 | PACK | Freq: Once | INTRAVENOUS | Status: AC | PRN
  Administered 2021-10-23: 11 via INTRAVENOUS
  Filled 2021-10-23: qty 11

## 2021-10-23 MED ORDER — AMINOPHYLLINE 25 MG/ML IV SOLN
75.0000 mg | Freq: Once | INTRAVENOUS | Status: AC
  Administered 2021-10-23: 75 mg via INTRAVENOUS

## 2021-10-27 ENCOUNTER — Other Ambulatory Visit: Payer: Self-pay | Admitting: Cardiovascular Disease

## 2021-10-27 ENCOUNTER — Other Ambulatory Visit: Payer: Self-pay | Admitting: Internal Medicine

## 2021-11-05 ENCOUNTER — Encounter: Payer: Self-pay | Admitting: Family Medicine

## 2021-11-10 DIAGNOSIS — I10 Essential (primary) hypertension: Secondary | ICD-10-CM | POA: Diagnosis not present

## 2021-11-10 DIAGNOSIS — Z951 Presence of aortocoronary bypass graft: Secondary | ICD-10-CM | POA: Diagnosis not present

## 2021-11-10 DIAGNOSIS — Z79899 Other long term (current) drug therapy: Secondary | ICD-10-CM | POA: Diagnosis not present

## 2021-11-10 DIAGNOSIS — E785 Hyperlipidemia, unspecified: Secondary | ICD-10-CM | POA: Diagnosis not present

## 2021-11-11 LAB — COMPREHENSIVE METABOLIC PANEL
ALT: 21 IU/L (ref 0–44)
AST: 25 IU/L (ref 0–40)
Albumin/Globulin Ratio: 1.8 (ref 1.2–2.2)
Albumin: 4.4 g/dL (ref 3.7–4.7)
Alkaline Phosphatase: 93 IU/L (ref 44–121)
BUN/Creatinine Ratio: 16 (ref 10–24)
BUN: 16 mg/dL (ref 8–27)
Bilirubin Total: 0.2 mg/dL (ref 0.0–1.2)
CO2: 23 mmol/L (ref 20–29)
Calcium: 9.1 mg/dL (ref 8.6–10.2)
Chloride: 105 mmol/L (ref 96–106)
Creatinine, Ser: 1 mg/dL (ref 0.76–1.27)
Globulin, Total: 2.4 g/dL (ref 1.5–4.5)
Glucose: 146 mg/dL — ABNORMAL HIGH (ref 70–99)
Potassium: 4 mmol/L (ref 3.5–5.2)
Sodium: 141 mmol/L (ref 134–144)
Total Protein: 6.8 g/dL (ref 6.0–8.5)
eGFR: 80 mL/min/{1.73_m2} (ref >59)

## 2021-11-11 LAB — LIPID PANEL
Chol/HDL Ratio: 3.5 ratio (ref 0.0–5.0)
Cholesterol, Total: 152 mg/dL (ref 100–199)
HDL: 43 mg/dL (ref >39)
LDL Chol Calc (NIH): 95 mg/dL (ref 0–99)
Triglycerides: 73 mg/dL (ref 0–149)
VLDL Cholesterol Cal: 14 mg/dL (ref 5–40)

## 2021-11-11 LAB — LIPOPROTEIN A (LPA): Lipoprotein (a): 136.2 nmol/L — ABNORMAL HIGH (ref <75.0)

## 2021-11-19 ENCOUNTER — Ambulatory Visit: Payer: Medicare Other | Admitting: Cardiovascular Disease

## 2021-11-21 ENCOUNTER — Encounter: Payer: Self-pay | Admitting: Cardiovascular Disease

## 2021-11-21 ENCOUNTER — Ambulatory Visit (INDEPENDENT_AMBULATORY_CARE_PROVIDER_SITE_OTHER): Payer: Medicare Other | Admitting: Cardiovascular Disease

## 2021-11-21 DIAGNOSIS — E785 Hyperlipidemia, unspecified: Secondary | ICD-10-CM

## 2021-11-21 DIAGNOSIS — E7841 Elevated Lipoprotein(a): Secondary | ICD-10-CM

## 2021-11-21 DIAGNOSIS — I25709 Atherosclerosis of coronary artery bypass graft(s), unspecified, with unspecified angina pectoris: Secondary | ICD-10-CM

## 2021-11-21 DIAGNOSIS — Z951 Presence of aortocoronary bypass graft: Secondary | ICD-10-CM

## 2021-11-21 DIAGNOSIS — I10 Essential (primary) hypertension: Secondary | ICD-10-CM

## 2021-11-21 NOTE — Patient Instructions (Addendum)
Medication Instructions:  Continue same medications *If you need a refill on your cardiac medications before your next appointment, please call your pharmacy*   Lab Work: None ordered   Testing/Procedures: None ordered   Follow-Up: At [facility] you and your health needs are our priority.  As part of our continuing mission to provide you with exceptional heart care, we have created designated Provider Care Teams.  These Care Teams include your primary Cardiologist (physician) and Advanced Practice Providers (APPs -  Physician Assistants and Nurse Practitioners) who all work together to provide you with the care you need, when you need it.  We recommend signing up for the patient portal called "MyChart".  Sign up information is provided on this After Visit Summary.  MyChart is used to connect with patients for Virtual Visits (Telemedicine).  Patients are able to view lab/test results, encounter notes, upcoming appointments, etc.  Non-urgent messages can be sent to your provider as well.   To learn more about what you can do with MyChart, go to [email or web address].    Your next appointment:  4 months    The format for your next appointment: Office    Provider: Dr.Kelly   :1}    Schedule appointment with Pharmacist in [facility] to start Repatha      Important Information About Sugar

## 2021-11-21 NOTE — Progress Notes (Signed)
Patient ID: [name], male   DOB: August 09, 1950, 71 y.o.   MRN: [id number]      Primary M.D.: Dr. Alysia Penna  HPI: [name] is a 71 y.o. male presents to the office today for a one month follow up cardiology evaluation.   [name] has CAD and in June 2002 underwent CABG surgery by Dr. Roxan Hockey with a sequential LIMA graft to the second diagonal and LAD, and sequential radial graft to the first and third diagonal branches. An echo Doppler study in 2010 showed normal systolic function with mild MR and trace TR.  A  nuclear perfusion study in March 2013 showed normal perfusion and function without scar or ischemia.  Additional problems include hypertension, type 2 diabetes mellitus, and hyperlipidemia. Remotely, he had worked as a Garment/textile technologist minor and in the past was some question of possible black lung. 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  In November 2015 he complained of a slight change in symptomatology with exertional shortness of breath and some arm weakness.  Prior to bypass surgery, he had never experienced chest pain but demonstrated exertional shortness of breath and arm discomfort.  He admitted to fatigue.  He denied any major episodes of chest pressure.  A 2-D echo Doppler study on 06/14/2014 showed an ejection fraction of 55-60% with mild focal basal hypertrophy of the septum; grade 1 diastolic dysfunction.  There was evidence for mild mitral regurgitation and moderate left atrial dilatation.  A nuclear perfusion study on December 11,2015 was interpreted as low risk.  He developed 1-2 mm of horizontal ST segment depression and had normal scintigraphic myocardial perfusion images.  Ejection fraction was 57%.  There was normal LV function without wall motion abnormality.  He has a history of hyperlipidemia and remotely self increased his simvastatin from 40 mg to 80 mg and developed occasional myalgias.   He has been on Bystolic 10 mg and lisinopril 30 mg for his hypertension.  He is on metformin 1000 mg twice a day as well as DiaBeta 5 mg for his type 2 diabetes mellitus.  When I saw him in October 2018, he had stopped taking Livalo for approximately one month.  He had been busy doing home projects and denied any recurrent chest pain.  During his last evaluation.  I had a lengthy discussion with him and recommended another attempt at a trial of Livalo.  I also added supplemental coenzyme Q 10.  Over this time period, he was able to reinitiate 2 mg daily and has been able to tolerate this without myalgias.  Subsequent blood work did show improvement in his lipid status on 08/04/2017 with a total cholesterol 149, LDL 82.  Triglycerides 148, and HDL 37.  He has continued to be on Januvia and metformin for his diabetes mellitus.  Hypertension.  He has remained on amlodipine 5 mg, lisinopril 30 mg, and Bystolic 10 mg.   When evaluated in March 2019 he was tolerating Livalo and I recommended further titration to 4 mg daily and attempt to obtain an LDL less than 70.  Since I saw him, he has had issues with poorly controlled diabetes mellitus.  Apparently he was started on farziga but developed some dizziness secondary to this.  He is now on metformin, Januvia, and was started on Actos.  Earlier this year he had a fall off a ladder.   I saw him in October 2019.  He required rotator  cuff surgery on his right shoulder.  His surgery was delayed due to initial glucose issues.  Since it is been over 17 years since his CABG revascularization, I recommended that he undergo a preoperative exercise Myoview study for preoperative assessment and clearance.  He underwent a nuclear perfusion study on May 05, 2018.  This was interpreted as low risk with normal perfusion and mildly reduced left ventricular global systolic function with an EF of 48%.  He developed horizontal ST segment depression of 1.5 mm during stress which  returned to baseline at 5 to 9 minutes into the recovery period.  When compared to his prior nuclear perfusion study of 2015 this was not significantly changed and on that study he also had normal perfusion with positive ECG changes.  A subsequent echo Doppler study on May 30, 2018 showed an EF of 55 to 60%.  There was severe LVH.  There was grade 1 diastolic dysfunction with mild MR and mild LA dilation.  He denies any chest pain.  He has had periods of increased cough following prolonged coughs he may feel somewhat atypical chest pain discomfort.  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 Lake Bells.  He had his pulmonary evaluation with Dr. 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  I saw him in March 2021.  Over the prior year he had remained stable from a cardiovascular standpoint.  He was not having anginal symptomatology 18-1/2 years following his CABG revascularization.  He developed Covid infection and was very weak and fatigued for 5 days.  In addition his son died in 12-09-2018 after he developed a seizure while driving a motorcycle and was around 72 years old.  In addition, his sister died at age 21 secondary to cancer.  He continues to be on amlodipine 5 mg, isosorbide 30 mg, lisinopril 40 mg and Bystolic 10 mg for hypertension.  He is on Zetia and rosuvastatin 10 mg for hyperlipidemia in addition to omega-3 fatty acids.  He is diabetic on Metformin, Farxiga, and Actos.    He was  evaluated by me in August 2021.  [name] has done  fairly well.  However, he admits to experiencing some episodes of chest pressure over the past 6 to 8 weeks.  Mostly this occurs without exertion and oftentimes occurs when he goes more in a reclining position when on his recliner chair.  However, he has noticed some vague symptomatology with walking.  However he walks his dogs regularly and often runs with them and denies any chest pressure when he is running with his dogs.  He does note some mild lightheadedness when he bends over and its up abruptly.  He is unaware of palpitations.  He underwent an echo Doppler study in February 05, 2020 which showed an EF of 55 to 60% and grade 2 diastolic dysfunction.  There was mild mitral gravitation, and mild aortic valve sclerosis.  The ascending aorta measured 38 mm.  Laboratory in April 2021 showed an LDL of 71 on Zetia and rosuvastatin.  He continues to be  on Metformin, Farxiga, and Actos for his diabetes mellitus.    He underwent a nuclear perfusion study for evaluation of his chest pain which was done on April 05, 2020.  This was low risk and showed an EF at 55%.  It was very mild ST depression at peak infusion without any definitive perfusion defect on nuclear imaging.  I saw him on August 15, 2020 at which time he felt well and denied any recurrent chest pain.  He continued  to be on amlodipine 5 mg, lisinopril 40 mg, isosorbide 30 mg, and Bystolic 5 mg for his hypertension and CAD.  He is diabetic on Farxiga, and glipizide in addition to Actos and recently he stopped taking Metformin.  He has felt improved off Metformin treatment.  He was on Zetia for hyperlipidemia.  He has not had recent laboratory.  During that evaluation, his blood pressure was elevated and I recommended slight titration of amlodipine from 5 mg up to 7.5 mg and recommended he continue his current regimen of isosorbide, lisinopril and nebivolol.  He was no longer on metformin but was on Farxiga in addition to glipizide and Actos for his  diabetes mellitus.  I last saw him on April 17,2023 and since his prior evaluation, he had developed COVID and had a long COVID syndrome.  Recently, he has had recurrent problems with prostate infections and has been on antibiotics.  He recently has noticed some chest discomfort and shortness of breath.  He is unaware of any palpitations.  He apparently is no longer on isosorbide due to dizziness and is no longer on Zetia.  He had undergone laboratory on August 05, 2021 which showed total cholesterol 180, triglycerides 102, HDL 42 and LDL 117.  During that evaluation, I recommended titration of amlodipine to 10 mg and scheduled him for [facility] study for reassessment of his chest pain and potential ischemia.  I renewed his prescription for sublingual nitroglycerin and recommended reinstitution of Zetia.  I recommended follow-up laboratory.  He underwent a Lexiscan Myoview study on October 28, 2021 which showed EF at 44% with a small fixed defect in the basal inferior wall consistent with possible small prior infarction without associated ischemia.  Presently, he continues to feel well and denies any chest pain or shortness of breath.  His back is better and he is now walking his dog 2-3 times per day.  He cannot tolerate statin therapy.  Recent lipid studies from Nov 10, 2021 showed total cholesterol 152 triglycerides 73 HDL 43 and LDL 95, improved from 125 in February 2022.  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  He presents for reevaluation.  Past Medical History:  Diagnosis Date   CAD (coronary artery disease)    nuclear stress test-09/09/11 low risk scan EF61%, sees Dr. Claiborne Billings    Chronic headache    Chronic neck pain    Diabetes mellitus    sees Dr. Kelton Pillar   Hyperlipidemia    Hypertension 12/26/2008   ECHO- EF>55%   Insomnia     Past Surgical History:  Procedure Laterality Date   APPENDECTOMY     cervical spine injection     COLONOSCOPY  04-02-06    per [facility], clear, repeat in 10  yrs   CORONARY ARTERY BYPASS GRAFT  2002   4 vessels    Allergies  Allergen Reactions   Codeine Anaphylaxis    Pt. verbalized that he has taken and can take Hydrocodone, Oxycodone w/o issue - codeine allergy only  Crestor [Rosuvastatin]     Myalgia    Isosorbide Nitrate Other (See Comments)    Headaches   Lipitor [Atorvastatin]     myalgia   Zetia [Ezetimibe]     MYALGIAS   Zolpidem Tartrate     REACTION: HA's   Ciprofloxacin Hives and Rash   Victoza [Liraglutide] Rash    Current Outpatient Medications  Medication Sig Dispense Refill   ACCU-CHEK GUIDE test strip USE TO TEST BLOOD SUGAR EVERY DAY 100 strip 3   Accu-Chek Softclix Lancets lancets USE TO CHECK BLOOD SUGAR ONCE DAILY 100 each 1   amLODipine (NORVASC) 10 MG tablet Take 1 tablet (10 mg total) by mouth daily. 30 tablet 3   aspirin 81 MG tablet Take 81 mg by mouth daily.     blood glucose meter kit and supplies Dispense based on patient and insurance preference. . (FOR ICD-10 E10.9, E11.9). Check blood sugar daily 1 each 1   clonazePAM (KLONOPIN) 0.5 MG tablet TAKE 1 TABLET BY MOUTH TWICE A DAY 60 tablet 5   Coenzyme Q10 (COQ10) 100 MG CAPS Take 100 mg by mouth daily.      doxycycline (VIBRA-TABS) 100 MG tablet Take 1 tablet by mouth 2 times daily for 10 days. 20 tablet 0   ezetimibe (ZETIA) 10 MG tablet Take 1 tablet (10 mg total) by mouth daily. 30 tablet 3   FARXIGA 10 MG TABS tablet TAKE 1 TABLET BY MOUTH EVERY DAY 30 tablet 11   gabapentin (NEURONTIN) 300 MG capsule TAKE 3 CAPSULES BY MOUTH AT BEDTIME 270 capsule 1   glipiZIDE (GLUCOTROL) 5 MG tablet Take 1 tablet (5 mg total) by mouth 2 (two) times daily before a meal. 180 tablet 3   Ibuprofen-diphenhydrAMINE Cit (ADVIL PM PO) Take by mouth.     Iron-Vitamins (GERITOL PO) Take by mouth.     lisinopril (ZESTRIL) 40 MG tablet TAKE 1 TABLET BY MOUTH EVERY DAY 90 tablet 3   nebivolol (BYSTOLIC) 10 MG tablet TAKE 1/2 TABLET BY MOUTH DAILY 45 tablet 3   nitroGLYCERIN  (NITROSTAT) 0.4 MG SL tablet Place 1 tablet (0.4 mg total) under the tongue every 5 (five) minutes as needed for chest pain. 25 tablet 6   Omega-3 350 MG CPDR Take 350 mg by mouth daily.     pioglitazone (ACTOS) 45 MG tablet Take 1 tablet (45 mg total) by mouth daily. 90 tablet 3   sildenafil (VIAGRA) 100 MG tablet Take 1 tablet (100 mg total) by mouth daily as needed for erectile dysfunction. 10 tablet 11   tamsulosin (FLOMAX) 0.4 MG CAPS capsule Take 1 capsule (0.4 mg total) by mouth daily. 90 capsule 3   Turmeric (QC TUMERIC COMPLEX PO) Take by mouth.     UNABLE TO FIND Med Name: Sleep Aid PRN     Alirocumab (PRALUENT) 75 MG/ML SOAJ Inject 75 mg into the skin every 14 (fourteen) days. 2 mL 11   No current facility-administered medications for this visit.    Social History   Socioeconomic History   Marital status: Married    Spouse name: Not on file   Number of children: Not on file   Years of education: Not on file   Highest education level: Not on file  Occupational History   Not on file  Tobacco Use   Smoking status: Former    Packs/day: 0.25    Years: 10.00    Pack years: 2.50    Types: Cigarettes    Quit date: 11/26/1970  Years since quitting: 51.0   Smokeless tobacco: Never  Vaping Use   Vaping Use: Never used  Substance and Sexual Activity   Alcohol use: No    Alcohol/week: 0.0 standard drinks   Drug use: No   Sexual activity: Not on file  Other Topics Concern   Not on file  Social History Narrative   Not on file   Social Determinants of Health   Financial Resource Strain: Low Risk    Difficulty of Paying Living Expenses: Not hard at all  Food Insecurity: No Food Insecurity   Worried About Charity fundraiser in the Last Year: Never true   [place] in the Last Year: Never true  Transportation Needs: No Transportation Needs   Lack of Transportation (Medical): No   Lack of Transportation (Non-Medical): No  Physical Activity: Sufficiently Active    Days of Exercise per Week: 5 days   Minutes of Exercise per Session: 30 min  Stress: No Stress Concern Present   Feeling of Stress : Not at all  Social Connections: Socially Integrated   Frequency of Communication with Friends and Family: More than three times a week   Frequency of Social Gatherings with Friends and Family: More than three times a week   Attends Religious Services: More than 4 times per year   Active Member of [employer] or Organizations: Yes   Attends Music therapist: More than 4 times per year   Marital Status: Married  Human resources officer Violence: Not At Risk   Fear of Current or Ex-Partner: No   Emotionally Abused: No   Physically Abused: No   Sexually Abused: No    Family History  Problem Relation Age of Onset   Liver cancer Father    Cancer Brother    Cancer Brother    Cancer Brother    Coronary artery disease Other    Hypertension Other    Heart disease Mother    Colon cancer Neg Hx    Stomach cancer Neg Hx    Esophageal cancer Neg Hx    Socially he is married. He has 3 children 8 grandchildren and 4 great-grandchildren. There is no tobacco or alcohol use. He is now retired.   ROS General: Negative; No fevers, chills, or night sweats;  HEENT: Decrease in hearing ,No changes in vision  sinus congestion, difficulty swallowing Pulmonary: History of "black lung" Cardiovascular: See history of present illness GI: Negative; No nausea, vomiting, diarrhea, or abdominal pain GU: Negative; No dysuria, hematuria, or difficulty voiding Musculoskeletal: Negative; no myalgias, joint pain, or weakness Hematologic/Oncology: Negative; no easy bruising, bleeding Endocrine: Positive for diabetes mellitus Neuro: Positive for paresthesias in his legs. Skin: Negative; No rashes or skin lesions Psychiatric: Negative; No behavioral problems, depression Sleep: Positive for restless legs; No snoring, daytime sleepiness, hypersomnolence, bruxism, hypnogognic  hallucinations, no cataplexy Other comprehensive 14 point system review is negative.   PE BP (!) 112/58   Pulse (!) 53   Ht 5' 9"  (1.753 m)   Wt 213 lb 9.6 oz (96.9 kg)   SpO2 97%   BMI 31.54 kg/m   Repeat blood pressure by me was 115/60.  Wt Readings from Last 3 Encounters:  11/26/21 215 lb 3.2 oz (97.6 kg)  11/21/21 213 lb 9.6 oz (96.9 kg)  10/23/21 213 lb (96.6 kg)   General: Alert, oriented, no distress.  Skin: normal turgor, no rashes, warm and dry HEENT: Normocephalic, atraumatic. Pupils equal round and reactive to light; sclera anicteric;  extraocular muscles intact;  Nose without nasal septal hypertrophy Mouth/Parynx benign; Mallinpatti scale 3 Neck: No JVD, no carotid bruits; normal carotid upstroke Lungs: clear to ausculatation and percussion; no wheezing or rales Chest wall: without tenderness to palpitation Heart: PMI not displaced, RRR, s1 s2 normal, 1/6 systolic murmur, no diastolic murmur, no rubs, gallops, thrills, or heaves Abdomen: soft, nontender; no hepatosplenomehaly, BS+; abdominal aorta nontender and not dilated by palpation. Back: no CVA tenderness Pulses 2+ Musculoskeletal: full range of motion, normal strength, no joint deformities Extremities: no clubbing cyanosis or edema, Homan's sign negative  Neurologic: grossly nonfocal; Cranial nerves grossly wnl Psychologic: Normal mood and affect  Nov 21, 2021 ECG (independently read by me): Sinus bradycardia at 53,small Q III,no ectopy   October 20, 2021 ECG (independently read by me): Sinus bradycardia at 53; small Q III t wave abnormality III, aVF  February 2022 ECG (independently read by me): Sinus Bradycardia at 57; early transition  August 2021 ECG (independently read by me): Sinus bradycardia 57 bpm.  Early transition.  No ectopy.  No ST segment changes.  March 2021 ECG (independently read by me): Sinus bradycardia 59 bpm.  Nonspecific ST abnormality.  Normal intervals.  No ectopy.  December 2019  ECG (independently read by me): Normal sinus rhythm at 61 bpm.  Early transition.  No ectopy.  No ST segment changes.  October 2019 ECG (independently read by me): Sinus bradycardia 53 bpm.  Early transition.  No ectopy.  March 2019 ECG (independently read by me): Sinus bradycardia 59 bpm.  Early transition.  Normal intervals.  October 2018 ECG (independently read by me): Normal sinus rhythm at 60 bpm.  Nonspecific T wave abnormality.  Normal intervals.  No ectopy.  May 2018 ECG (independently read by me): Sinus bradycardia 59 bpm.  No significant ST-T changes.  Early transition.  April 2017 ECG (independently read by me): Sinus bradycardia at 53 bpm.  Q waves in III and F.  March 2016 ECG (independently read by me): Normal sinus rhythm at 67.  No ectopy.  November 2015 ECG (independently read by me): Normal sinus rhythm at 65 bpm.  Early transition.  No significant ST segment changes.  Normal intervals.  Prior ECG: Normal sinus rhythm at 58 beats per minute. QTc interval 428 ms. PR interval 170 ms. Nonspecific ST changes   LABS:     Latest Ref Rng & Units 11/10/2021    8:55 AM 08/05/2021    8:45 AM 03/17/2021    4:01 PM  BMP  Glucose 70 - 99 mg/dL 146   119   124    BUN 8 - 27 mg/dL 16   19   14     Creatinine 0.76 - 1.27 mg/dL 1.00   1.05   1.02    BUN/Creat Ratio 10 - 24 16      Sodium 134 - 144 mmol/L 141   142   141    Potassium 3.5 - 5.2 mmol/L 4.0   3.9   3.9    Chloride 96 - 106 mmol/L 105   106   104    CO2 20 - 29 mmol/L 23   28   27     Calcium 8.6 - 10.2 mg/dL 9.1   9.4   9.7         Latest Ref Rng & Units 11/10/2021    8:55 AM 08/05/2021    8:45 AM 08/15/2020    8:44 AM  Hepatic Function  Total Protein 6.0 - 8.5  g/dL 6.8   7.1   7.3    Albumin 3.7 - 4.7 g/dL 4.4   4.3   4.6    AST 0 - 40 IU/L 25   22   31     ALT 0 - 44 IU/L 21   18   29     Alk Phosphatase 44 - 121 IU/L 93   82   92    Total Bilirubin 0.0 - 1.2 mg/dL 0.2   0.4   0.2    Bilirubin, Direct 0.0 - 0.3  mg/dL  0.1          Latest Ref Rng & Units 08/05/2021    8:45 AM 03/17/2021    4:01 PM 08/15/2020    8:44 AM  CBC  WBC 4.0 - 10.5 K/uL 5.9   6.7   5.7    Hemoglobin 13.0 - 17.0 g/dL 12.7   12.9   13.4    Hematocrit 39.0 - 52.0 % 39.7   39.3   41.6    Platelets 150.0 - 400.0 K/uL 212.0   210.0   256     Lab Results  Component Value Date   MCV 81.1 08/05/2021   MCV 82.0 03/17/2021   MCV 84 08/15/2020    Lab Results  Component Value Date   TSH 4.88 08/05/2021   Lab Results  Component Value Date   HGBA1C 7.6 (A) 09/17/2021    BNP No results found for: PROBNP  Lipid Panel     Component Value Date/Time   CHOL 152 11/10/2021 0855   CHOL 173 05/24/2013 1049   TRIG 73 11/10/2021 0855   TRIG 185 (H) 05/24/2013 1049   HDL 43 11/10/2021 0855   HDL 39 (L) 05/24/2013 1049   CHOLHDL 3.5 11/10/2021 0855   CHOLHDL 4 08/05/2021 0845   VLDL 20.4 08/05/2021 0845   LDLCALC 95 11/10/2021 0855   LDLCALC 84 11/22/2017 0846   LDLCALC 97 05/24/2013 1049     RADIOLOGY: No results found.  LEXISCAN MYOVIEW 04/05/2020 Study Highlights    The left ventricular ejection fraction is normal (55-65%). Nuclear stress EF: 55%. Horizontal ST segment depression ST segment depression was noted during stress. No T wave inversion was noted during stress. The study is normal. This is a low risk study.   Similar to prior nuclear stress tests, there were very mild st depressions at peak infusion, but there is no clear perfusion defect seen on imaging. Normal EF.   Lexiscan Myoview: October 27, 2021   Findings are consistent with prior myocardial infarction. The study is intermediate risk.   No ST deviation was noted.   LV perfusion is abnormal. Defect 1: There is a small defect with moderate reduction in uptake present in the basal inferior location(s) that is fixed. There is abnormal wall motion in the defect area. Consistent with infarction. Rotating planar data shows a linear artifact across  the diaphragm which may account for inferior wall perfusion artifact, however in the setting of abnormal wall motion, may represent true infarction.   Left ventricular function is abnormal. Nuclear stress EF: 44 %. The left ventricular ejection fraction is moderately decreased (30-44%). End diastolic cavity size is moderately enlarged.   Prior study available for comparison from 04/05/2020. There are changes compared to prior study which appear to be new. The left ventricular ejection fraction has decreased.    IMPRESSION:  1. Coronary artery disease involving coronary bypass graft of native heart with angina pectoris ([place])   2. Hx  of CABG   3. Elevated Lp(a)   4. Hyperlipidemia with target LDL less than 55.   5. Essential hypertension     ASSESSMENT AND PLAN: [name] is a 71 year-old Caucasian male who underwent CABG revascularization surgery by Dr. Roxan Hockey in November 2002.  Remotely he had noticed mild shortness of breath and fatigue and never experienced classic substernal chest pressure.  A nuclear perfusion study in 2015 continued to show normal perfusion but he developed ST segment changes of 1-2 mm.  Amlodipine was added to his medical regimen and since institution he felt improved.   A 4-year follow-up nuclear stress test in 2019 in anticipation of potential future shoulder surgery was unchanged from previously and continued to show normal perfusion but again he developed asymptomatic ST depression which lasted 5 to 9 minutes into the recovery period for resolution.  He never had a shoulder surgery.  At his evaluation in August 2021 he was experiencing some episodes of chest discomfort.  He underwent follow-up nuclear imaging study on April 05, 2020 which essentially was unchanged from his prior evaluation and remain low risk.  At his last office visit in February 2022 his blood pressure was elevated and amlodipine was further titrated to 7.5 mg.  At his evaluation in April  2023 he had experienced some episodic chest discomfort.  Apparently he was no longer taking isosorbide nor Zetia and at that time recommended further titration of amlodipine to 10 mg with reinstitution of Zetia.  I reviewed his most recent [place] study which suggested a small basal inferior region of scar with EF slightly reduced compared to previously.  He has not had any further chest pain.  There was no ischemia present and he has felt improved with the increased amlodipine.  I reviewed his most recent laboratory and he was also noted to have an elevated LP(a) at 136.  I have recommended target LDL less than 55.  With his statin intolerance and potential 26% reduction in LP(a) with Repatha, I am referring him to our pharmacist for initiation of Repatha therapy.  Repeat laboratory will be obtained in 3 months.  I will see him in 4 months for reevaluation or sooner as needed.    Troy Sine, MD, [facility]  11/29/2021 2:09 PM

## 2021-11-26 ENCOUNTER — Ambulatory Visit (INDEPENDENT_AMBULATORY_CARE_PROVIDER_SITE_OTHER): Payer: Medicare Other | Admitting: Pharmacist

## 2021-11-26 ENCOUNTER — Telehealth: Payer: Self-pay

## 2021-11-26 VITALS — BP 126/76 | HR 60 | Resp 14 | Ht 69.5 in | Wt 215.2 lb

## 2021-11-26 DIAGNOSIS — E119 Type 2 diabetes mellitus without complications: Secondary | ICD-10-CM | POA: Diagnosis not present

## 2021-11-26 DIAGNOSIS — E785 Hyperlipidemia, unspecified: Secondary | ICD-10-CM

## 2021-11-26 DIAGNOSIS — I2510 Atherosclerotic heart disease of native coronary artery without angina pectoris: Secondary | ICD-10-CM | POA: Diagnosis not present

## 2021-11-26 MED ORDER — PRALUENT 75 MG/ML ~~LOC~~ SOAJ: 75.0000 mg | SUBCUTANEOUS | 11 refills | Status: DC

## 2021-11-26 NOTE — Patient Instructions (Signed)
It was nice meeting you today  We would like your LDL (bad cholesterol) to be less than 55  Please continue your ezetimibe 10mg  once a day  We would like to start you on a new medication called Repatha or Praluent which you will inject once every 2 weeks  Once you start the medication we will recheck your cholesterol in 2-3 months  Please call with any questions  Karren Cobble, PharmD, [place], [place], [place], [place], [place], [address] Phone: [phone], Fax: [phone]

## 2021-11-26 NOTE — Progress Notes (Signed)
Patient ID: [name]                 DOB: [birth year]                    MRN: [id number]     HPI: [name] is a 71 y.o. male patient referred to lipid clinic by [name]. PMH is significant for MI, CAD, T2DM (A1c 7.6), HTN, smoking and statin intolerance.  Patient presents today with wife who I also saw recently for lipid management.  Has tried rosuvastatin, atorvastatin, pitavastatin, and simvastatin. All caused myalgias.  Currently managed on Zetia 45m daily.   Current Medications:  Zetia 171m Intolerances:  Atorvastatin Rosuvastatin  Risk Factors:  CAD Hx of CABG T2DM Hx of smoking  LDL goal: <55  Labs: TC 152, trigs 73, HDL 43, LDL 95, LPA 136.2  Past Medical History:  Diagnosis Date   CAD (coronary artery disease)    nuclear stress test-09/09/11 low risk scan EF61%, sees [name]  Chronic headache    Chronic neck pain    Diabetes mellitus    sees [name] Hyperlipidemia    Hypertension 12/26/2008   ECHO- EF>55%   Insomnia     Current Outpatient Medications on File Prior to Visit  Medication Sig Dispense Refill   ACCU-CHEK GUIDE test strip USE TO TEST BLOOD SUGAR EVERY DAY 100 strip 3   Accu-Chek Softclix Lancets lancets USE TO CHECK BLOOD SUGAR ONCE DAILY 100 each 1   amLODipine (NORVASC) 10 MG tablet Take 1 tablet (10 mg total) by mouth daily. 30 tablet 3   aspirin 81 MG tablet Take 81 mg by mouth daily.     blood glucose meter kit and supplies Dispense based on patient and insurance preference. . (FOR ICD-10 E10.9, E11.9). Check blood sugar daily 1 each 1   clonazePAM (KLONOPIN) 0.5 MG tablet TAKE 1 TABLET BY MOUTH TWICE A DAY 60 tablet 5   Coenzyme Q10 (COQ10) 100 MG CAPS Take 100 mg by mouth daily.      doxycycline (VIBRA-TABS) 100 MG tablet Take 1 tablet by mouth 2 times daily for 10 days. 20 tablet 0   ezetimibe (ZETIA) 10 MG tablet Take 1 tablet (10 mg total) by mouth daily. 30 tablet 3   FARXIGA 10 MG TABS tablet TAKE 1  TABLET BY MOUTH EVERY DAY 30 tablet 11   gabapentin (NEURONTIN) 300 MG capsule TAKE 3 CAPSULES BY MOUTH AT BEDTIME 270 capsule 1   glipiZIDE (GLUCOTROL) 5 MG tablet Take 1 tablet (5 mg total) by mouth 2 (two) times daily before a meal. 180 tablet 3   Ibuprofen-diphenhydrAMINE Cit (ADVIL PM PO) Take by mouth.     Iron-Vitamins (GERITOL PO) Take by mouth.     lisinopril (ZESTRIL) 40 MG tablet TAKE 1 TABLET BY MOUTH EVERY DAY 90 tablet 3   nebivolol (BYSTOLIC) 10 MG tablet TAKE 1/2 TABLET BY MOUTH DAILY 45 tablet 3   nitroGLYCERIN (NITROSTAT) 0.4 MG SL tablet Place 1 tablet (0.4 mg total) under the tongue every 5 (five) minutes as needed for chest pain. 25 tablet 6   Omega-3 350 MG CPDR Take 350 mg by mouth daily.     pioglitazone (ACTOS) 45 MG tablet Take 1 tablet (45 mg total) by mouth daily. 90 tablet 3   sildenafil (VIAGRA) 100 MG tablet Take 1 tablet (100 mg total) by mouth daily as needed for erectile dysfunction. 10 tablet 11  tamsulosin (FLOMAX) 0.4 MG CAPS capsule Take 1 capsule (0.4 mg total) by mouth daily. 90 capsule 3   Turmeric (QC TUMERIC COMPLEX PO) Take by mouth.     UNABLE TO FIND Med Name: Sleep Aid PRN     No current facility-administered medications on file prior to visit.    Allergies  Allergen Reactions   Codeine Anaphylaxis    Pt. verbalized that he has taken and can take Hydrocodone, Oxycodone w/o issue - codeine allergy only   Crestor [Rosuvastatin]     Myalgia    Isosorbide Nitrate Other (See Comments)    Headaches   Lipitor [Atorvastatin]     myalgia   Zolpidem Tartrate     REACTION: HA's   Ciprofloxacin Hives and Rash   Victoza [Liraglutide] Rash    Assessment/Plan:  1. Hyperlipidemia - Patient's LDL 95 which is above goal of <55.  Aggressive goal selected due to T2DM and hx of CAD.  Unfortunately is intolerant to statin therapy. Recommend PCSK9i.    Using demo pen, educated patient on mechanism of action, storage, site selection, administration and  possible adverse effects. Patient voiced understanding. Will complete PA and contact patient when approved. Rechech lipid panel in 2-3 months.  Continue Zetia 99m daily Start Praluent 75 mg sq q 14 days Recheck lipid panel in 2-3 days  CKarren Bennett PharmD, [facility] [address]Phone: [phone] Fax: [phone]

## 2021-11-26 NOTE — Telephone Encounter (Signed)
VERBAL ORDERS FROM DR Delma Officer:  DO PA FOR PRALUENT 75MG : AWAITING ?'S CURRENTLY [name] (Key ) Praluent 75MG /ML auto-injectors Status: Question Request Created: May 24th, 2023  ROUTING BACK TO DR. PAVERO TO REQUEST PERMISSION TO ORDER LIPID PANEL POST 4TH DOSE   ALSO COPAY CARD APPROVED AND IS AS FOLLOWS:

## 2021-11-26 NOTE — Telephone Encounter (Signed)
Praluent 75mg  approved . Will hold off sending until orders for lipid panel received.

## 2021-11-26 NOTE — Telephone Encounter (Signed)
Called and spoke to pt's wife, stated praluent 75mg  q2w approved, along with copay card, rx sent, instructed the pt to complete fasting labs post 6th dose and they voiced understanding.

## 2021-11-29 ENCOUNTER — Encounter: Payer: Self-pay | Admitting: Cardiovascular Disease

## 2021-12-02 ENCOUNTER — Other Ambulatory Visit: Payer: Medicare Other

## 2021-12-02 ENCOUNTER — Encounter: Payer: Self-pay | Admitting: Family Medicine

## 2021-12-02 ENCOUNTER — Ambulatory Visit (INDEPENDENT_AMBULATORY_CARE_PROVIDER_SITE_OTHER): Payer: Medicare Other | Admitting: Family Medicine

## 2021-12-02 ENCOUNTER — Ambulatory Visit (INDEPENDENT_AMBULATORY_CARE_PROVIDER_SITE_OTHER): Payer: Medicare Other

## 2021-12-02 VITALS — BP 124/68 | HR 53 | Temp 98.5°F | Wt 213.0 lb

## 2021-12-02 DIAGNOSIS — M545 Low back pain, unspecified: Secondary | ICD-10-CM

## 2021-12-02 MED ORDER — CYCLOBENZAPRINE HCL 10 MG PO TABS: 10.0000 mg | ORAL_TABLET | Freq: Three times a day (TID) | ORAL | 5 refills | Status: DC | PRN

## 2021-12-02 MED ORDER — METHYLPREDNISOLONE 4 MG PO TBPK: ORAL_TABLET | ORAL | 0 refills | Status: DC

## 2021-12-02 MED ORDER — HYDROCODONE-ACETAMINOPHEN 10-325 MG PO TABS
1.0000 | ORAL_TABLET | Freq: Three times a day (TID) | ORAL | 0 refills | Status: AC | PRN
Start: 2021-12-02 — End: 2021-12-07

## 2021-12-02 NOTE — Progress Notes (Signed)
   Subjective:    Patient ID: [name], male    DOB: 03/28/1951, 71 y.o.   MRN: [id number]  HPI Here for 2 weeks of low back pain and spasms, which can be severe at times. He thinks this was caused by him helping to build an outdoor fellowship hall at his church. He has been applying heat and taking Tylenol with poor results. The pain is in the center of the lower back. No radiation to the legs.    Review of Systems  Constitutional: Negative.   Respiratory: Negative.    Cardiovascular: Negative.   Musculoskeletal:  Positive for back pain.      Objective:   Physical Exam Constitutional:      Comments: In obvious pain, moving slowly   Cardiovascular:     Rate and Rhythm: Normal rate and regular rhythm.     Pulses: Normal pulses.     Heart sounds: Normal heart sounds.  Pulmonary:     Effort: Pulmonary effort is normal.     Breath sounds: Normal breath sounds.  Musculoskeletal:     Comments: He is tender over the lower back and there is a lot of surrounding spasm. ROM is limited by pain, and SLR are positive for both legs.   Neurological:     Mental Status: He is alert.          Assessment & Plan:  Lumbar pain, likely from a herniated disc. We will treat this with a Medrol dose pack, Flexeril, and Norco 10-325 as needed. Get Xrays of the lumbar spine today. Gershon Crane, MD

## 2021-12-22 ENCOUNTER — Other Ambulatory Visit: Payer: Self-pay | Admitting: Family Medicine

## 2021-12-22 NOTE — Telephone Encounter (Signed)
Last refill- 06/16/21--60 tabs, 5 refills Last OV- 12/02/21   No future OV scheduled.

## 2022-01-10 ENCOUNTER — Inpatient Hospital Stay (HOSPITAL_COMMUNITY)
Admission: EM | Admit: 2022-01-10 | Discharge: 2022-01-13 | DRG: 247 | Disposition: A | Discharge status: Home / Self Care | Payer: Medicare Other | Attending: Cardiovascular Disease | Admitting: Cardiovascular Disease

## 2022-01-10 ENCOUNTER — Emergency Department (HOSPITAL_COMMUNITY): Payer: Medicare Other

## 2022-01-10 ENCOUNTER — Encounter (HOSPITAL_COMMUNITY): Payer: Self-pay | Admitting: Emergency Medicine

## 2022-01-10 ENCOUNTER — Other Ambulatory Visit: Payer: Self-pay

## 2022-01-10 DIAGNOSIS — Z951 Presence of aortocoronary bypass graft: Secondary | ICD-10-CM | POA: Diagnosis not present

## 2022-01-10 DIAGNOSIS — I10 Essential (primary) hypertension: Secondary | ICD-10-CM | POA: Diagnosis not present

## 2022-01-10 DIAGNOSIS — I2510 Atherosclerotic heart disease of native coronary artery without angina pectoris: Secondary | ICD-10-CM | POA: Diagnosis not present

## 2022-01-10 DIAGNOSIS — Z885 Allergy status to narcotic agent status: Secondary | ICD-10-CM

## 2022-01-10 DIAGNOSIS — I200 Unstable angina: Secondary | ICD-10-CM | POA: Diagnosis not present

## 2022-01-10 DIAGNOSIS — R079 Chest pain, unspecified: Secondary | ICD-10-CM | POA: Diagnosis not present

## 2022-01-10 DIAGNOSIS — E119 Type 2 diabetes mellitus without complications: Secondary | ICD-10-CM | POA: Diagnosis not present

## 2022-01-10 DIAGNOSIS — Z8679 Personal history of other diseases of the circulatory system: Secondary | ICD-10-CM

## 2022-01-10 DIAGNOSIS — Z8639 Personal history of other endocrine, nutritional and metabolic disease: Secondary | ICD-10-CM

## 2022-01-10 DIAGNOSIS — R001 Bradycardia, unspecified: Secondary | ICD-10-CM | POA: Diagnosis not present

## 2022-01-10 DIAGNOSIS — Z79899 Other long term (current) drug therapy: Secondary | ICD-10-CM

## 2022-01-10 DIAGNOSIS — R0789 Other chest pain: Principal | ICD-10-CM

## 2022-01-10 DIAGNOSIS — Z8249 Family history of ischemic heart disease and other diseases of the circulatory system: Secondary | ICD-10-CM | POA: Diagnosis not present

## 2022-01-10 DIAGNOSIS — M549 Dorsalgia, unspecified: Secondary | ICD-10-CM | POA: Diagnosis not present

## 2022-01-10 DIAGNOSIS — Z888 Allergy status to other drugs, medicaments and biological substances status: Secondary | ICD-10-CM | POA: Diagnosis not present

## 2022-01-10 DIAGNOSIS — I25110 Atherosclerotic heart disease of native coronary artery with unstable angina pectoris: Principal | ICD-10-CM | POA: Diagnosis present

## 2022-01-10 DIAGNOSIS — E785 Hyperlipidemia, unspecified: Secondary | ICD-10-CM | POA: Diagnosis not present

## 2022-01-10 DIAGNOSIS — Z87891 Personal history of nicotine dependence: Secondary | ICD-10-CM

## 2022-01-10 DIAGNOSIS — R519 Headache, unspecified: Secondary | ICD-10-CM | POA: Diagnosis not present

## 2022-01-10 DIAGNOSIS — G8929 Other chronic pain: Secondary | ICD-10-CM | POA: Diagnosis present

## 2022-01-10 DIAGNOSIS — Z7984 Long term (current) use of oral hypoglycemic drugs: Secondary | ICD-10-CM | POA: Diagnosis not present

## 2022-01-10 DIAGNOSIS — Z7982 Long term (current) use of aspirin: Secondary | ICD-10-CM | POA: Diagnosis not present

## 2022-01-10 DIAGNOSIS — Z881 Allergy status to other antibiotic agents status: Secondary | ICD-10-CM | POA: Diagnosis not present

## 2022-01-10 DIAGNOSIS — I959 Hypotension, unspecified: Secondary | ICD-10-CM | POA: Diagnosis not present

## 2022-01-10 LAB — CBC WITH DIFFERENTIAL/PLATELET
Abs Immature Granulocytes: 0.02 10*3/uL (ref 0.00–0.07)
Basophils Absolute: 0 10*3/uL (ref 0.0–0.1)
Basophils Relative: 1 %
Eosinophils Absolute: 0.1 10*3/uL (ref 0.0–0.5)
Eosinophils Relative: 1 %
HCT: 36.8 % — ABNORMAL LOW (ref 39.0–52.0)
Hemoglobin: 11.7 g/dL — ABNORMAL LOW (ref 13.0–17.0)
Immature Granulocytes: 0 %
Lymphocytes Relative: 33 %
Lymphs Abs: 2.1 10*3/uL (ref 0.7–4.0)
MCH: 26.6 pg (ref 26.0–34.0)
MCHC: 31.8 g/dL (ref 30.0–36.0)
MCV: 83.6 fL (ref 80.0–100.0)
Monocytes Absolute: 0.8 10*3/uL (ref 0.1–1.0)
Monocytes Relative: 12 %
Neutro Abs: 3.3 10*3/uL (ref 1.7–7.7)
Neutrophils Relative %: 53 %
Platelets: 226 10*3/uL (ref 150–400)
RBC: 4.4 MIL/uL (ref 4.22–5.81)
RDW: 16.6 % — ABNORMAL HIGH (ref 11.5–15.5)
WBC: 6.3 10*3/uL (ref 4.0–10.5)
nRBC: 0 % (ref 0.0–0.2)

## 2022-01-10 LAB — TROPONIN I (HIGH SENSITIVITY)
Troponin I (High Sensitivity): 4 ng/L (ref <18)
Troponin I (High Sensitivity): 8 ng/L (ref <18)

## 2022-01-10 LAB — BASIC METABOLIC PANEL
Anion gap: 10 (ref 5–15)
BUN: 14 mg/dL (ref 8–23)
CO2: 24 mmol/L (ref 22–32)
Calcium: 8.8 mg/dL — ABNORMAL LOW (ref 8.9–10.3)
Chloride: 108 mmol/L (ref 98–111)
Creatinine, Ser: 0.97 mg/dL (ref 0.61–1.24)
GFR, Estimated: >60 mL/min (ref >60)
Glucose, Bld: 127 mg/dL — ABNORMAL HIGH (ref 70–99)
Potassium: 3.7 mmol/L (ref 3.5–5.1)
Sodium: 142 mmol/L (ref 135–145)

## 2022-01-10 LAB — CBG MONITORING, ED: Glucose-Capillary: 95 mg/dL (ref 70–99)

## 2022-01-10 LAB — HEMOGLOBIN A1C
Hgb A1c MFr Bld: 7.8 % — ABNORMAL HIGH (ref 4.8–5.6)
Mean Plasma Glucose: 177.16 mg/dL

## 2022-01-10 LAB — TSH: TSH: 2.702 u[IU]/mL (ref 0.350–4.500)

## 2022-01-10 LAB — D-DIMER, QUANTITATIVE: D-Dimer, Quant: 0.31 ug/mL-FEU (ref 0.00–0.50)

## 2022-01-10 LAB — GLUCOSE, CAPILLARY: Glucose-Capillary: 159 mg/dL — ABNORMAL HIGH (ref 70–99)

## 2022-01-10 MED ORDER — ACETAMINOPHEN 325 MG PO TABS: 650.0000 mg | ORAL_TABLET | ORAL | Status: DC | PRN

## 2022-01-10 MED ORDER — GABAPENTIN 300 MG PO CAPS
600.0000 mg | ORAL_CAPSULE | Freq: Every day | ORAL | Status: DC
  Administered 2022-01-10 – 2022-01-12 (×3): 600 mg via ORAL
  Filled 2022-01-10 (×3): qty 2

## 2022-01-10 MED ORDER — HEPARIN BOLUS VIA INFUSION
4000.0000 [IU] | Freq: Once | INTRAVENOUS | Status: AC
  Administered 2022-01-10: 4000 [IU] via INTRAVENOUS
  Filled 2022-01-10: qty 4000

## 2022-01-10 MED ORDER — NITROGLYCERIN 0.4 MG SL SUBL
0.4000 mg | SUBLINGUAL_TABLET | SUBLINGUAL | Status: DC | PRN
  Filled 2022-01-10: qty 1

## 2022-01-10 MED ORDER — LISINOPRIL 20 MG PO TABS
40.0000 mg | ORAL_TABLET | Freq: Every day | ORAL | Status: DC
  Administered 2022-01-10 – 2022-01-11 (×2): 40 mg via ORAL
  Filled 2022-01-10 (×2): qty 2

## 2022-01-10 MED ORDER — GABAPENTIN 300 MG PO CAPS
300.0000 mg | ORAL_CAPSULE | Freq: Every day | ORAL | Status: DC
  Administered 2022-01-11 – 2022-01-13 (×3): 300 mg via ORAL
  Filled 2022-01-10 (×3): qty 1

## 2022-01-10 MED ORDER — AMLODIPINE BESYLATE 10 MG PO TABS
10.0000 mg | ORAL_TABLET | Freq: Every day | ORAL | Status: DC
Start: 2022-01-10 — End: 2022-01-12
  Administered 2022-01-10 – 2022-01-11 (×2): 10 mg via ORAL
  Filled 2022-01-10: qty 1
  Filled 2022-01-10: qty 2
  Filled 2022-01-10: qty 1

## 2022-01-10 MED ORDER — ASPIRIN 81 MG PO CHEW: 81.0000 mg | CHEWABLE_TABLET | Freq: Every day | ORAL | Status: DC

## 2022-01-10 MED ORDER — ASPIRIN 81 MG PO TBEC
81.0000 mg | DELAYED_RELEASE_TABLET | Freq: Every day | ORAL | Status: DC
  Administered 2022-01-11: 81 mg via ORAL
  Filled 2022-01-10: qty 1

## 2022-01-10 MED ORDER — NEBIVOLOL HCL 10 MG PO TABS
10.0000 mg | ORAL_TABLET | Freq: Every day | ORAL | Status: DC
  Filled 2022-01-10 (×2): qty 1

## 2022-01-10 MED ORDER — FENTANYL CITRATE PF 50 MCG/ML IJ SOSY
50.0000 ug | PREFILLED_SYRINGE | Freq: Once | INTRAMUSCULAR | Status: AC
  Administered 2022-01-10: 50 ug via INTRAVENOUS
  Filled 2022-01-10: qty 1

## 2022-01-10 MED ORDER — NITROGLYCERIN 2 % TD OINT
1.0000 [in_us] | TOPICAL_OINTMENT | Freq: Once | TRANSDERMAL | Status: AC
  Administered 2022-01-10: 1 [in_us] via TOPICAL
  Filled 2022-01-10: qty 1

## 2022-01-10 MED ORDER — TAMSULOSIN HCL 0.4 MG PO CAPS
0.4000 mg | ORAL_CAPSULE | Freq: Every day | ORAL | Status: DC
  Administered 2022-01-10 – 2022-01-12 (×3): 0.4 mg via ORAL
  Filled 2022-01-10 (×3): qty 1

## 2022-01-10 MED ORDER — CLONAZEPAM 0.5 MG PO TABS
1.0000 mg | ORAL_TABLET | Freq: Every day | ORAL | Status: DC
  Administered 2022-01-10 – 2022-01-12 (×3): 1 mg via ORAL
  Filled 2022-01-10 (×3): qty 2

## 2022-01-10 MED ORDER — HEPARIN (PORCINE) 25000 UT/250ML-% IV SOLN
1600.0000 [IU]/h | INTRAVENOUS | Status: DC
  Administered 2022-01-10: 1200 [IU]/h via INTRAVENOUS
  Administered 2022-01-11: 1400 [IU]/h via INTRAVENOUS
  Administered 2022-01-12: 1600 [IU]/h via INTRAVENOUS
  Filled 2022-01-10 (×3): qty 250

## 2022-01-10 MED ORDER — DAPAGLIFLOZIN PROPANEDIOL 10 MG PO TABS
10.0000 mg | ORAL_TABLET | Freq: Every day | ORAL | Status: DC
  Administered 2022-01-11 – 2022-01-13 (×3): 10 mg via ORAL
  Filled 2022-01-10 (×4): qty 1

## 2022-01-10 MED ORDER — INSULIN ASPART 100 UNIT/ML IJ SOLN
0.0000 [IU] | Freq: Three times a day (TID) | INTRAMUSCULAR | Status: DC
  Administered 2022-01-11: 2 [IU] via SUBCUTANEOUS

## 2022-01-10 MED ORDER — ONDANSETRON HCL 4 MG/2ML IJ SOLN
4.0000 mg | Freq: Four times a day (QID) | INTRAMUSCULAR | Status: DC | PRN
  Administered 2022-01-10: 4 mg via INTRAVENOUS
  Filled 2022-01-10: qty 2

## 2022-01-10 NOTE — H&P (Signed)
Cardiology H&P:   Patient ID: [name] MRN: [id number]; DOB: 13-Mar-1951  Admit date: 01/10/2022 Date of Consult: 01/10/2022  PCP:  Laurey Morale, MD   [facility] Providers Cardiologist:  Shelva Majestic, MD   {   Patient Profile:   [name] is a 71 y.o. 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 Pearline Cables.  History of Present Illness:   [name] presented with c/o of chest pain.  He follows Dr Claiborne Billings for CAD. He underwent CABG 12/2000 with a sequential LIMA graft to the second diagonal and LAD, and sequential radial graft to the first and third diagonal branches.   Most recent echocardiogram 02/05/2020 with LVEF 55 to 60%, no regional wall motion abnormality, mild LVH, grade 2 diastolic dysfunction, normal RV, RVSP 27.8 mmHg, moderate LAE, mild RAE, mild MR, mild aortic sclerosis.  Last Lexiscan Myoview was done on 10/23/2021, EF at 44% with a small fixed defect in the basal inferior wall consistent with possible small prior infarction without associated ischemia. He was last seen by Dr. Claiborne Billings 11/21/2021, feeling well, denied any chest pain or shortness of breath.  He was maintained on amlodipine 10 mg, aspirin 81 mg, lisinopril 40 mg, Bystolic 10 mg for CAD.  He is intolerant to statin, self stopped isosorbide and Zetia at the time.  He was referred to pharmacy for initiation of Repatha.   Patient presented to the ER today complaining chest pain.  He reports sudden onset of severe chest pressure this morning around 9:30 AM.  Pain was radiating to bilateral arms and arms felt heavy.  He reports associated diaphoresis and nausea.  He took 4 tablets of 81 mg aspirin and 2 tablets of nitroglycerin without any relief of symptoms.  He was given nitroglycerin and morphine in route to ED with minor improvement.   He currently  reports he is chest pain-free.  Currently has Nitropaste on.  Apparently took several rounds of nitroglycerin at rate of his pain.  On further interrogation he has been having on and off chest pain for several weeks.  Apparently he is taking several nitroglycerin.  He is taking this several times per day.  He is having chest discomfort with minimal movement.  Symptoms are concerning for unstable angina.  Admission diagnostic so far revealed unremarkable BMP, CBC differential showed hemoglobin 11.7, otherwise unremarkable.  High sensitive troponin negative x2 (4>8).  CXR without acute finding.  Twelve-lead EKG showed sinus bradycardia with ventricular rate of 54 bpm, inferior Q wave, no acute changes.  Cardiology to admit for accelerated angina.   Past Medical History:  Diagnosis Date   CAD (coronary artery disease)    nuclear stress test-09/09/11 low risk scan EF61%, sees Dr. Claiborne Billings    Chronic headache    Chronic neck pain    Diabetes mellitus    sees Dr. Kelton Pillar   Hyperlipidemia    Hypertension 12/26/2008   ECHO- EF>55%   Insomnia     Past Surgical History:  Procedure Laterality Date   APPENDECTOMY     cervical spine injection     COLONOSCOPY  04-02-06    per [facility], clear, repeat in 10 yrs   CORONARY ARTERY BYPASS GRAFT  2002   4 vessels     Home Medications:  Prior to Admission medications  Medication Sig Start Date End Date Taking? Authorizing Provider  ACCU-CHEK GUIDE test strip USE TO TEST BLOOD SUGAR EVERY DAY 01/24/21   Laurey Morale, MD  Accu-Chek Softclix Lancets lancets USE TO CHECK BLOOD SUGAR ONCE DAILY 11/25/20   Laurey Morale, MD  Alirocumab (PRALUENT) 75 MG/ML SOAJ Inject 75 mg into the skin every 14 (fourteen) days. 11/26/21   Troy Sine, MD  amLODipine (NORVASC) 10 MG tablet Take 1 tablet (10 mg total) by mouth daily. 10/20/21   Troy Sine, MD  aspirin 81 MG tablet Take 81 mg by mouth daily.    [provider]  blood glucose meter kit and  supplies Dispense based on patient and insurance preference. . (FOR ICD-10 E10.9, E11.9). Check blood sugar daily 04/30/20   Laurey Morale, MD  clonazePAM (KLONOPIN) 0.5 MG tablet TAKE 1 TABLET BY MOUTH TWICE A DAY 12/22/21   Laurey Morale, MD  Coenzyme Q10 (COQ10) 100 MG CAPS Take 100 mg by mouth daily.     [provider]  cyclobenzaprine (FLEXERIL) 10 MG tablet Take 1 tablet (10 mg total) by mouth 3 (three) times daily as needed for muscle spasms. 12/02/21   Laurey Morale, MD  doxycycline (VIBRA-TABS) 100 MG tablet Take 1 tablet by mouth 2 times daily for 10 days. 10/07/21   Laurey Morale, MD  FARXIGA 10 MG TABS tablet TAKE 1 TABLET BY MOUTH EVERY DAY 10/27/21   Shamleffer, Melanie Crazier, MD  gabapentin (NEURONTIN) 300 MG capsule TAKE 3 CAPSULES BY MOUTH AT BEDTIME 06/16/21   Laurey Morale, MD  glipiZIDE (GLUCOTROL) 5 MG tablet Take 1 tablet (5 mg total) by mouth 2 (two) times daily before a meal. 03/19/21   Shamleffer, Melanie Crazier, MD  Ibuprofen-diphenhydrAMINE Cit (ADVIL PM PO) Take by mouth.    [provider]  Iron-Vitamins (GERITOL PO) Take by mouth.    [provider]  lisinopril (ZESTRIL) 40 MG tablet TAKE 1 TABLET BY MOUTH EVERY DAY 12/04/20   Troy Sine, MD  methylPREDNISolone (MEDROL DOSEPAK) 4 MG TBPK tablet As directed 12/02/21   Laurey Morale, MD  nebivolol (BYSTOLIC) 10 MG tablet TAKE 1/2 TABLET BY MOUTH DAILY 01/14/21   Troy Sine, MD  nitroGLYCERIN (NITROSTAT) 0.4 MG SL tablet Place 1 tablet (0.4 mg total) under the tongue every 5 (five) minutes as needed for chest pain. 10/20/21 01/18/22  Troy Sine, MD  Omega-3 350 MG CPDR Take 350 mg by mouth daily.    [provider]  pioglitazone (ACTOS) 45 MG tablet Take 1 tablet (45 mg total) by mouth daily. 03/19/21   Shamleffer, Melanie Crazier, MD  sildenafil (VIAGRA) 100 MG tablet Take 1 tablet (100 mg total) by mouth daily as needed for erectile dysfunction. 08/05/21   Laurey Morale, MD   tamsulosin (FLOMAX) 0.4 MG CAPS capsule Take 1 capsule (0.4 mg total) by mouth daily. 08/05/21   Laurey Morale, MD  Turmeric (QC TUMERIC COMPLEX PO) Take by mouth.    [provider]  UNABLE TO FIND Med Name: Sleep Aid PRN    [provider]    Inpatient Medications: Scheduled Meds:  Continuous Infusions:  PRN Meds:   Allergies:    Allergies  Allergen Reactions   Codeine Anaphylaxis    Pt. verbalized that he has taken and can take Hydrocodone, Oxycodone w/o issue - codeine allergy only   Crestor [Rosuvastatin]     Myalgia    Isosorbide Nitrate  Other (See Comments)    Headaches   Lipitor [Atorvastatin]     myalgia   Zetia [Ezetimibe]     MYALGIAS   Zolpidem Tartrate     REACTION: HA's   Ciprofloxacin Hives and Rash   Victoza [Liraglutide] Rash    Social History:   Social History   Socioeconomic History   Marital status: Married    Spouse name: Not on file   Number of children: Not on file   Years of education: Not on file   Highest education level: Not on file  Occupational History   Not on file  Tobacco Use   Smoking status: Former    Packs/day: 0.25    Years: 10.00    Total pack years: 2.50    Types: Cigarettes    Quit date: 11/26/1970    Years since quitting: 51.1   Smokeless tobacco: Never  Vaping Use   Vaping Use: Never used  Substance and Sexual Activity   Alcohol use: No    Alcohol/week: 0.0 standard drinks of alcohol   Drug use: No   Sexual activity: Not on file  Other Topics Concern   Not on file  Social History Narrative   Not on file   Social Determinants of Health   Financial Resource Strain: Low Risk  (10/13/2021)   Overall Financial Resource Strain (CARDIA)    Difficulty of Paying Living Expenses: Not hard at all  Food Insecurity: No Food Insecurity (10/13/2021)   Hunger Vital Sign    Worried About Running Out of Food in the Last Year: Never true    Ran Out of Food in the Last Year: Never true  Transportation  Needs: No Transportation Needs (10/13/2021)   PRAPARE - Hydrologist (Medical): No    Lack of Transportation (Non-Medical): No  Physical Activity: Sufficiently Active (10/13/2021)   Exercise Vital Sign    Days of Exercise per Week: 5 days    Minutes of Exercise per Session: 30 min  Stress: No Stress Concern Present (10/13/2021)   [place]    Feeling of Stress : Not at all  Social Connections: [place] (10/13/2021)   Social Connection and Isolation Panel [NHANES]    Frequency of Communication with Friends and Family: More than three times a week    Frequency of Social Gatherings with Friends and Family: More than three times a week    Attends Religious Services: More than 4 times per year    Active Member of [employer] or Organizations: Yes    Attends Music therapist: More than 4 times per year    Marital Status: Married  Human resources officer Violence: Not At Risk (10/13/2021)   Humiliation, Afraid, Rape, and Kick questionnaire    Fear of Current or Ex-Partner: No    Emotionally Abused: No    Physically Abused: No    Sexually Abused: No    Family History:    Family History  Problem Relation Age of Onset   Liver cancer Father    Cancer Brother    Cancer Brother    Cancer Brother    Coronary artery disease Other    Hypertension Other    Heart disease Mother    Colon cancer Neg Hx    Stomach cancer Neg Hx    Esophageal cancer Neg Hx      ROS:  Constitutional: see HPI  Eyes: Denied vision change or loss Ears/Nose/Mouth/Throat: Denied ear ache,  sore throat, coughing, sinus pain Cardiovascular: see HPI  Respiratory: denied shortness of breath Gastrointestinal: see HPI  Genital/Urinary: Denied dysuria, hematuria, urinary frequency/urgency Musculoskeletal: Denied muscle ache, joint pain, weakness Skin: Denied rash, wound Neuro: Denied headache, dizziness,  syncope Psych: Denied history of depression/anxiety  Endocrine: history of diabetes    Physical Exam/Data:   Vitals:   01/10/22 1415 01/10/22 1445 01/10/22 1515 01/10/22 1524  BP: 124/70 125/69 128/66   Pulse: 61 (!) 49 (!) 46 (!) 56  Resp: 20 (!) 22 12 14   Temp:      TempSrc:      SpO2: 97% 97% 97% 98%  Weight:      Height:       No intake or output data in the 24 hours ending 01/10/22 1621    01/10/2022   11:04 AM 12/02/2021   11:29 AM 11/26/2021    7:53 AM  Last 3 Weights  Weight (lbs) 206 lb 213 lb 215 lb 3.2 oz  Weight (kg) 93.441 kg 96.616 kg 97.614 kg     Body mass index is 29.98 kg/m.   General:  Well nourished, well developed, in no acute distress HEENT: normal Neck: no JVD Vascular: No carotid bruits; Distal pulses 2+ bilaterally Cardiac:  normal S1, S2; RRR; no murmur Lungs:  clear to auscultation bilaterally, no wheezing, rhonchi or rales  Abd: soft, nontender, no hepatomegaly  Ext: no edema Musculoskeletal:  No deformities, BUE and BLE strength normal and equal Skin: warm and dry  Neuro:  CNs 2-12 intact, no focal abnormalities noted Psych:  Normal affect   EKG:  The EKG was personally reviewed and demonstrates: Sinus bradycardia 54bpm, inferior Q wave, no acute changes  Telemetry:  Telemetry was personally reviewed and demonstrates: Sinus bradycardia heart rate in the 50s  Relevant CV Studies:  Lexiscan Myoview 10/23/2021:    Findings are consistent with prior myocardial infarction. The study is intermediate risk.   No ST deviation was noted.   LV perfusion is abnormal. Defect 1: There is a small defect with moderate reduction in uptake present in the basal inferior location(s) that is fixed. There is abnormal wall motion in the defect area. Consistent with infarction. Rotating planar data shows a linear artifact across the diaphragm which may account for inferior wall perfusion artifact, however in the setting of abnormal wall motion, may represent true  infarction.   Left ventricular function is abnormal. Nuclear stress EF: 44 %. The left ventricular ejection fraction is moderately decreased (30-44%). End diastolic cavity size is moderately enlarged.   Prior study available for comparison from 04/05/2020. There are changes compared to prior study which appear to be new. The left ventricular ejection fraction has decreased.  Echocardiogram 02/05/2020:   1. Left ventricular ejection fraction, by estimation, is 55 to 60%. The  left ventricle has normal function. The left ventricle has no regional  wall motion abnormalities. There is mild left ventricular hypertrophy.  Left ventricular diastolic parameters  are consistent with Grade II diastolic dysfunction (pseudonormalization).   2. Right ventricular systolic function is normal. The right ventricular  size is normal. There is normal pulmonary artery systolic pressure. The  estimated right ventricular systolic pressure is 71.2 mmHg.   3. Left atrial size was moderately dilated.   4. Right atrial size was mildly dilated.   5. The mitral valve is normal in structure. Mild mitral valve  regurgitation. No evidence of mitral stenosis.   6. The aortic valve is tricuspid. Aortic valve regurgitation is not  visualized. Mild aortic valve sclerosis is present, with no evidence of  aortic valve stenosis.   7. Aortic dilatation noted. There is mild dilatation of the ascending  aorta measuring 38 mm.   8. The inferior vena cava is normal in size with greater than 50%  respiratory variability, suggesting right atrial pressure of 3 mmHg.   Laboratory Data:  High Sensitivity Troponin:   Recent Labs  Lab 01/10/22 1107 01/10/22 1334  TROPONINIHS 4 8     Chemistry Recent Labs  Lab 01/10/22 1107  NA 142  K 3.7  CL 108  CO2 24  GLUCOSE 127*  BUN 14  CREATININE 0.97  CALCIUM 8.8*  GFRNONAA >60  ANIONGAP 10    No results for input(s): "PROT", "ALBUMIN", "AST", "ALT", "ALKPHOS", "BILITOT" in the  last 168 hours. Lipids No results for input(s): "CHOL", "TRIG", "HDL", "LABVLDL", "LDLCALC", "CHOLHDL" in the last 168 hours.  Hematology Recent Labs  Lab 01/10/22 1107  WBC 6.3  RBC 4.40  HGB 11.7*  HCT 36.8*  MCV 83.6  MCH 26.6  MCHC 31.8  RDW 16.6*  PLT 226   Thyroid No results for input(s): "TSH", "FREET4" in the last 168 hours.  BNPNo results for input(s): "BNP", "PROBNP" in the last 168 hours.  DDimer  Recent Labs  Lab 01/10/22 1334  DDIMER 0.31     Radiology/Studies:  DG Chest Portable 1 View  Result Date: 01/10/2022 CLINICAL DATA:  Chest pain EXAM: PORTABLE CHEST 1 VIEW COMPARISON:  02/20/2019 FINDINGS: 1314 hours. Low volume film. The lungs are clear without focal pneumonia, edema, pneumothorax or pleural effusion. The cardio pericardial silhouette is enlarged. Telemetry leads overlie the chest. IMPRESSION: Low volume film without acute cardiopulmonary findings. Electronically Signed   By: Misty Stanley M.D.   On: 01/10/2022 13:58     Assessment and Plan:   #Unstable Angina.  #CAD s/p CABG -Admitted with worsening chest discomfort.  Took several nitroglycerin and nitroglycerin paste to improve his symptoms.  Over the past few weeks has had progressively worsening angina which is concerning for accelerating angina.  His overall picture is concerning for unstable angina. -Troponins are negative and EKG is unremarkable but this does not mean that he does not have critical blockages.  His most recent stress test shows infarct pattern.  Overall I am highly concerned given the fact that his grafts are over 72 years old. -Start heparin drip.  Continue aspirin.  Statin intolerant.  On Praluent at home. -Currently chest pain-free.  Would start with nitroglycerin drip if chest pain recurs. -TSH, A1c, lipid profile to be ordered tomorrow. -Risk and benefits of left heart catheterization discussed.  He is willing to proceed on Monday. -Repeat echo. -Currently on bisoprolol.   Amlodipine.  Lisinopril.  Blood pressure control.  Could add Imdur as well if chest pain recurs.  Would likely discuss a nitroglycerin drip given acuity of situation.  #Hyperlipidemia -Statin intolerant.  On Praluent at home.  #Diabetes -Hold glipizide.  Hold Actos.  Continue Farxiga.  Sign scale insulin.  Recheck A1c.  #Hypertension -Continue home Norvasc, amlodipine, lisinopril.  As needed hydralazine.  FEN -No intravenous fluids -Diet: Heart healthy -DVT PPx: Heparin drip -Code: Full -Disposition: Left heart catheterization Monday.  Admit to inpatient.  Severity of Illness: The appropriate patient status for this patient is INPATIENT. Inpatient status is judged to be reasonable and necessary in order to provide the required intensity of service to ensure the patient's safety. The patient's presenting symptoms, physical exam findings, and  initial radiographic and laboratory data in the context of their chronic comorbidities is felt to place them at high risk for further clinical deterioration. Furthermore, it is not anticipated that the patient will be medically stable for discharge from the hospital within 2 midnights of admission.   * I certify that at the point of admission it is my clinical judgment that the patient will require inpatient hospital care spanning beyond 2 midnights from the point of admission due to high intensity of service, high risk for further deterioration and high frequency of surveillance required.*   For questions or updates, please contact [place] Please consult [email or web address] for contact info under    Signed, [place] T. Audie Box, MD, [place]  01/10/2022 4:57 PM

## 2022-01-10 NOTE — ED Triage Notes (Addendum)
Pt to ED via EMS from home with c/o CP that began around approx 10AM. Pt reports pain is centralized and radiates into back and both arms. Given 8mg  morphine, 324 asp, 3x SL nitro PTA with EMS. Pain currently rated 3/10. Patient alert and oriented on arrival to ED. Hx of quad bypass, blockages with cardiac stent placement.  EMS v/s: 130/64 175 cbg 96% 2L [place] 56 pulse

## 2022-01-10 NOTE — ED Notes (Signed)
ED TO INPATIENT HANDOFF REPORT  ED Nurse Name and Phone #: Paulino Rily [phone]  S Name/Age/Gender [name] 71 y.o. male Room/Bed: 022C/022C  Code Status   Code Status: Full Code  Home/SNF/Other Home Patient oriented to: self, place, time, and situation Is this baseline? Yes   Triage Complete: Triage complete  Chief Complaint Unstable angina (HCC) [I20.0]  Triage Note Pt to ED via EMS from home with c/o CP that began around approx 10AM. Pt reports pain is centralized and radiates into back and both arms. Given 8mg  morphine, 324 asp, 3x SL nitro PTA with EMS. Pain currently rated 3/10. Patient alert and oriented on arrival to ED. Hx of quad bypass, blockages with cardiac stent placement.  EMS v/s: 130/64 175 cbg 96% 2L [place] 56 pulse    Allergies Allergies  Allergen Reactions   Codeine Anaphylaxis    Pt. verbalized that he has taken and can take Hydrocodone, Oxycodone w/o issue - codeine allergy only   Crestor [Rosuvastatin]     Myalgia    Isosorbide Nitrate Other (See Comments)    Headaches   Lipitor [Atorvastatin]     myalgia   Zetia [Ezetimibe]     MYALGIAS   Zolpidem Tartrate     REACTION: HA's   Ciprofloxacin Hives and Rash   Victoza [Liraglutide] Rash    Level of Care/Admitting Diagnosis ED Disposition     ED Disposition  Admit   Condition  --   Comment  Hospital Area: [facility] [[id number]]  Level of Care: Telemetry Cardiac [[id number]]  May place patient in observation at [facility] or [facility] if equivalent level of care is available:: No  Covid Evaluation: Asymptomatic - no recent exposure (last 10 days) testing not required  Diagnosis: Unstable angina [facility]) [facility]  Admitting Physician: [[id number]] Sande Rives  Attending Physician: [[id number]] Sande Rives          B Medical/Surgery History Past Medical History:  Diagnosis Date   CAD (coronary artery disease)    nuclear stress test-09/09/11 low risk  scan EF61%, sees Dr. 11/09/11    Chronic headache    Chronic neck pain    Diabetes mellitus    sees Dr. Tresa Endo   Hyperlipidemia    Hypertension 12/26/2008   ECHO- EF>55%   Insomnia    Past Surgical History:  Procedure Laterality Date   APPENDECTOMY     cervical spine injection     COLONOSCOPY  04-02-06    per [facility], clear, repeat in 10 yrs   CORONARY ARTERY BYPASS GRAFT  2002   4 vessels     A IV Location/Drains/Wounds Patient Lines/Drains/Airways Status     Active Line/Drains/Airways     Name Placement date Placement time Site Days   Peripheral IV 01/10/22 20 G 1.16" Left Antecubital 01/10/22  --  Antecubital  less than 1            Intake/Output Last 24 hours No intake or output data in the 24 hours ending 01/10/22 1712  Labs/Imaging Results for orders placed or performed during the hospital encounter of 01/10/22 (from the past 48 hour(s))  CBC with Differential     Status: Abnormal   Collection Time: 01/10/22 11:07 AM  Result Value Ref Range   WBC 6.3 4.0 - 10.5 K/uL   RBC 4.40 4.22 - 5.81 MIL/uL   Hemoglobin 11.7 (L) 13.0 - 17.0 g/dL   HCT 03/13/22 (L) 57.3 - 22.0 %   MCV 83.6 80.0 -  100.0 fL   MCH 26.6 26.0 - 34.0 pg   MCHC 31.8 30.0 - 36.0 g/dL   RDW 34.1 (H) 96.2 - 22.9 %   Platelets 226 150 - 400 K/uL   nRBC 0.0 0.0 - 0.2 %   Neutrophils Relative % 53 %   Neutro Abs 3.3 1.7 - 7.7 K/uL   Lymphocytes Relative 33 %   Lymphs Abs 2.1 0.7 - 4.0 K/uL   Monocytes Relative 12 %   Monocytes Absolute 0.8 0.1 - 1.0 K/uL   Eosinophils Relative 1 %   Eosinophils Absolute 0.1 0.0 - 0.5 K/uL   Basophils Relative 1 %   Basophils Absolute 0.0 0.0 - 0.1 K/uL   Immature Granulocytes 0 %   Abs Immature Granulocytes 0.02 0.00 - 0.07 K/uL    Comment: Performed at [facility], [address], [place], [place] [address]  Basic metabolic panel     Status: Abnormal   Collection Time: 01/10/22 11:07 AM  Result Value Ref Range   Sodium 142 135 - 145 mmol/L    Potassium 3.7 3.5 - 5.1 mmol/L   Chloride 108 98 - 111 mmol/L   CO2 24 22 - 32 mmol/L   Glucose, Bld 127 (H) 70 - 99 mg/dL    Comment: Glucose reference range applies only to samples taken after fasting for at least 8 hours.   BUN 14 8 - 23 mg/dL   Creatinine, Ser 1.19 0.61 - 1.24 mg/dL   Calcium 8.8 (L) 8.9 - 10.3 mg/dL   GFR, Estimated >41 >74 mL/min    Comment: (NOTE) Calculated using the CKD-EPI Creatinine Equation (2021)    Anion gap 10 5 - 15    Comment: Performed at [facility], [address], [place], [place] [address]  Troponin I (High Sensitivity)     Status: None   Collection Time: 01/10/22 11:07 AM  Result Value Ref Range   Troponin I (High Sensitivity) 4 <18 ng/L    Comment: (NOTE) Elevated high sensitivity troponin I (hsTnI) values and significant  changes across serial measurements may suggest ACS but many other  chronic and acute conditions are known to elevate hsTnI results.  Refer to the "Links" section for chest pain algorithms and additional  guidance. Performed at [facility], [address], [place], [place] [address]   Troponin I (High Sensitivity)     Status: None   Collection Time: 01/10/22  1:34 PM  Result Value Ref Range   Troponin I (High Sensitivity) 8 <18 ng/L    Comment: (NOTE) Elevated high sensitivity troponin I (hsTnI) values and significant  changes across serial measurements may suggest ACS but many other  chronic and acute conditions are known to elevate hsTnI results.  Refer to the "Links" section for chest pain algorithms and additional  guidance. Performed at [facility], [address], [place], [place] [address]   D-dimer, quantitative     Status: None   Collection Time: 01/10/22  1:34 PM  Result Value Ref Range   D-Dimer, Quant 0.31 0.00 - 0.50 ug/mL-FEU    Comment: (NOTE) At the manufacturer cut-off value of 0.5 g/mL FEU, this assay has a negative predictive value of 95-100%.This assay is intended for  use in conjunction with a clinical pretest probability (PTP) assessment model to exclude pulmonary embolism (PE) and deep venous thrombosis (DVT) in outpatients suspected of PE or DVT. Results should be correlated with clinical presentation. Performed at [facility],  [address] [place], [place] [address]    DG Chest Portable 1 View  Result Date: 01/10/2022 CLINICAL DATA:  Chest pain EXAM: PORTABLE CHEST 1 VIEW COMPARISON:  02/20/2019 FINDINGS: 1314 hours. Low volume film. The lungs are clear without focal pneumonia, edema, pneumothorax or pleural effusion. The cardio pericardial silhouette is enlarged. Telemetry leads overlie the chest. IMPRESSION: Low volume film without acute cardiopulmonary findings. Electronically Signed   By: Kennith Center M.D.   On: 01/10/2022 13:58    Pending Labs Unresulted Labs (From admission, onward)     Start     Ordered   01/12/22 0500  Heparin level (unfractionated)  Daily,   R     See Hyperspace for full Linked Orders Report.   01/10/22 1705   01/11/22 0500  Lipid panel  Tomorrow morning,   R        01/10/22 1654   01/11/22 0500  Basic metabolic panel  Tomorrow morning,   R        01/10/22 1654   01/11/22 0500  CBC  Tomorrow morning,   R        01/10/22 1654   01/11/22 0500  CBC  Daily,   R     See Hyperspace for full Linked Orders Report.   01/10/22 1705   01/11/22 0100  Heparin level (unfractionated)  Once-Timed,   TIMED        01/10/22 1705   01/10/22 1654  TSH  Once,   R        01/10/22 1654   01/10/22 1654  Hemoglobin A1c  Once,   R        01/10/22 1654            Vitals/Pain Today's Vitals   01/10/22 1445 01/10/22 1515 01/10/22 1524 01/10/22 1524  BP: 125/69 128/66    Pulse: (!) 49 (!) 46 (!) 56   Resp: (!) 22 12 14    Temp:      TempSrc:      SpO2: 97% 97% 98%   Weight:      Height:      PainSc:   0-No pain 0-No pain    Isolation Precautions No active isolations  Medications Medications  aspirin EC tablet 81  mg (has no administration in time range)  nitroGLYCERIN (NITROSTAT) SL tablet 0.4 mg (has no administration in time range)  acetaminophen (TYLENOL) tablet 650 mg (has no administration in time range)  ondansetron (ZOFRAN) injection 4 mg (has no administration in time range)  amLODipine (NORVASC) tablet 10 mg (has no administration in time range)  dapagliflozin propanediol (FARXIGA) tablet 10 mg (has no administration in time range)  lisinopril (ZESTRIL) tablet 40 mg (has no administration in time range)  tamsulosin (FLOMAX) capsule 0.4 mg (has no administration in time range)  nebivolol (BYSTOLIC) tablet 10 mg (has no administration in time range)  insulin aspart (novoLOG) injection 0-9 Units (has no administration in time range)  heparin bolus via infusion 4,000 Units (has no administration in time range)  heparin ADULT infusion 100 units/mL (25000 units/249mL) (has no administration in time range)  fentaNYL (SUBLIMAZE) injection 50 mcg (50 mcg Intravenous Given 01/10/22 1128)  nitroGLYCERIN (NITROGLYN) 2 % ointment 1 inch (1 inch Topical Given 01/10/22 1219)    Mobility walks with person assist Low fall risk   Focused Assessments Cardiac Assessment Handoff:  Cardiac Rhythm: Sinus bradycardia No results found for: "CKTOTAL", "CKMB", "CKMBINDEX", "TROPONINI" Lab Results  Component Value Date   DDIMER 0.31 01/10/2022  Does the Patient currently have chest pain? No    R Recommendations: See Admitting Provider Note  Report given to:   Additional Notes:

## 2022-01-10 NOTE — Progress Notes (Signed)
Cardiology paged regarding home medication regimen, per pt request.

## 2022-01-10 NOTE — ED Provider Notes (Signed)
[facility] EMERGENCY DEPARTMENT Provider Note   CSN: [id number] Arrival date & time: 01/10/22  1057     History  Chief Complaint  Patient presents with   Chest Pain    [name] is a 71 y.o. male.  Patient is a 71 year old male with past medical history of hypertension, hyperlipidemia, coronary artery disease, and CABG in 2020 presenting for complaints of chest pain. Patient admits to acute onset, severe, generalized chest pressure with radiation to the bilateral arms stating "my arms feel super heavy", with associated diaphoresis and nausea that occurred this morning at 9:30 AM.  Took 4 baby aspirin and 2 nitroglycerin with no relief prior to arrival.  Patient received nitroglycerin and morphine in route to ED with minimal improvement.  The history is provided by the patient. No language interpreter was used.  Chest Pain Associated symptoms: no abdominal pain, no back pain, no cough, no fever, no palpitations, no shortness of breath and no vomiting        Home Medications Prior to Admission medications   Medication Sig Start Date End Date Taking? Authorizing Provider  ACCU-CHEK GUIDE test strip USE TO TEST BLOOD SUGAR EVERY DAY 01/24/21   Laurey Morale, MD  Accu-Chek Softclix Lancets lancets USE TO CHECK BLOOD SUGAR ONCE DAILY 11/25/20   Laurey Morale, MD  Alirocumab (PRALUENT) 75 MG/ML SOAJ Inject 75 mg into the skin every 14 (fourteen) days. 11/26/21   Troy Sine, MD  amLODipine (NORVASC) 10 MG tablet Take 1 tablet (10 mg total) by mouth daily. 10/20/21   Troy Sine, MD  aspirin 81 MG tablet Take 81 mg by mouth daily.    [provider]  blood glucose meter kit and supplies Dispense based on patient and insurance preference. . (FOR ICD-10 E10.9, E11.9). Check blood sugar daily 04/30/20   Laurey Morale, MD  clonazePAM (KLONOPIN) 0.5 MG tablet TAKE 1 TABLET BY MOUTH TWICE A DAY 12/22/21   Laurey Morale, MD  Coenzyme Q10 (COQ10) 100 MG  CAPS Take 100 mg by mouth daily.     [provider]  cyclobenzaprine (FLEXERIL) 10 MG tablet Take 1 tablet (10 mg total) by mouth 3 (three) times daily as needed for muscle spasms. 12/02/21   Laurey Morale, MD  doxycycline (VIBRA-TABS) 100 MG tablet Take 1 tablet by mouth 2 times daily for 10 days. 10/07/21   Laurey Morale, MD  FARXIGA 10 MG TABS tablet TAKE 1 TABLET BY MOUTH EVERY DAY 10/27/21   Shamleffer, Melanie Crazier, MD  gabapentin (NEURONTIN) 300 MG capsule TAKE 3 CAPSULES BY MOUTH AT BEDTIME 06/16/21   Laurey Morale, MD  glipiZIDE (GLUCOTROL) 5 MG tablet Take 1 tablet (5 mg total) by mouth 2 (two) times daily before a meal. 03/19/21   Shamleffer, Melanie Crazier, MD  Ibuprofen-diphenhydrAMINE Cit (ADVIL PM PO) Take by mouth.    [provider]  Iron-Vitamins (GERITOL PO) Take by mouth.    [provider]  lisinopril (ZESTRIL) 40 MG tablet TAKE 1 TABLET BY MOUTH EVERY DAY 12/04/20   Troy Sine, MD  methylPREDNISolone (MEDROL DOSEPAK) 4 MG TBPK tablet As directed 12/02/21   Laurey Morale, MD  nebivolol (BYSTOLIC) 10 MG tablet TAKE 1/2 TABLET BY MOUTH DAILY 01/14/21   Troy Sine, MD  nitroGLYCERIN (NITROSTAT) 0.4 MG SL tablet Place 1 tablet (0.4 mg total) under the tongue every 5 (five) minutes as needed for chest pain. 10/20/21 01/18/22  Claiborne Billings,  Joyice Faster, MD  Omega-3 350 MG CPDR Take 350 mg by mouth daily.    [provider]  pioglitazone (ACTOS) 45 MG tablet Take 1 tablet (45 mg total) by mouth daily. 03/19/21   Shamleffer, Melanie Crazier, MD  sildenafil (VIAGRA) 100 MG tablet Take 1 tablet (100 mg total) by mouth daily as needed for erectile dysfunction. 08/05/21   Laurey Morale, MD  tamsulosin (FLOMAX) 0.4 MG CAPS capsule Take 1 capsule (0.4 mg total) by mouth daily. 08/05/21   Laurey Morale, MD  Turmeric (QC TUMERIC COMPLEX PO) Take by mouth.    [provider]  UNABLE TO FIND Med Name: Sleep Aid PRN    [provider]       Allergies    Codeine, Crestor [rosuvastatin], Isosorbide nitrate, Lipitor [atorvastatin], Zetia [ezetimibe], Zolpidem tartrate, Ciprofloxacin, and Victoza [liraglutide]    Review of Systems   Review of Systems  Constitutional:  Negative for chills and fever.  HENT:  Negative for ear pain and sore throat.   Eyes:  Negative for pain and visual disturbance.  Respiratory:  Negative for cough and shortness of breath.   Cardiovascular:  Positive for chest pain. Negative for palpitations.  Gastrointestinal:  Negative for abdominal pain and vomiting.  Genitourinary:  Negative for dysuria and hematuria.  Musculoskeletal:  Negative for arthralgias and back pain.  Skin:  Negative for color change and rash.  Neurological:  Negative for seizures and syncope.  All other systems reviewed and are negative.   Physical Exam Updated Vital Signs BP 125/69   Pulse (!) 49   Temp 97.6 F (36.4 C) (Oral)   Resp (!) 22   Ht 5' 9.5" (1.765 m)   Wt 93.4 kg   SpO2 97%   BMI 29.98 kg/m  Physical Exam Vitals and nursing note reviewed.  Constitutional:      General: He is not in acute distress.    Appearance: He is well-developed.  HENT:     Head: Normocephalic and atraumatic.  Eyes:     Conjunctiva/sclera: Conjunctivae normal.  Cardiovascular:     Rate and Rhythm: Normal rate and regular rhythm.     Pulses:          Radial pulses are 2+ on the right side and 2+ on the left side.     Heart sounds: No murmur heard. Pulmonary:     Effort: Pulmonary effort is normal. No respiratory distress.     Breath sounds: Normal breath sounds.  Abdominal:     Palpations: Abdomen is soft.     Tenderness: There is no abdominal tenderness.  Musculoskeletal:        General: No swelling.     Cervical back: Neck supple.  Skin:    General: Skin is warm and dry.     Capillary Refill: Capillary refill takes less than 2 seconds.  Neurological:     Mental Status: He is alert.  Psychiatric:        Mood and  Affect: Mood normal.     ED Results / Procedures / Treatments   Labs (all labs ordered are listed, but only abnormal results are displayed) Labs Reviewed  CBC WITH DIFFERENTIAL/PLATELET - Abnormal; Notable for the following components:      Result Value   Hemoglobin 11.7 (*)    HCT 36.8 (*)    RDW 16.6 (*)    All other components within normal limits  BASIC METABOLIC PANEL - Abnormal; Notable for the following components:  Glucose, Bld 127 (*)    Calcium 8.8 (*)    All other components within normal limits  D-DIMER, QUANTITATIVE  TROPONIN I (HIGH SENSITIVITY)  TROPONIN I (HIGH SENSITIVITY)    EKG EKG Interpretation  Date/Time:  Saturday January 10 2022 11:07:13 EDT Ventricular Rate:  54 PR Interval:  166 QRS Duration: 116 QT Interval:  497 QTC Calculation: 471 R Axis:   87 Text Interpretation: Sinus rhythm Nonspecific intraventricular conduction delay Abnormal inferior Q waves Confirmed by Campbell Stall ([id number]) on 12/06/8464 11:19:54 AM  Radiology DG Chest Portable 1 View  Result Date: 01/10/2022 CLINICAL DATA:  Chest pain EXAM: PORTABLE CHEST 1 VIEW COMPARISON:  02/20/2019 FINDINGS: 1314 hours. Low volume film. The lungs are clear without focal pneumonia, edema, pneumothorax or pleural effusion. The cardio pericardial silhouette is enlarged. Telemetry leads overlie the chest. IMPRESSION: Low volume film without acute cardiopulmonary findings. Electronically Signed   By: Misty Stanley M.D.   On: 01/10/2022 13:58    Procedures Procedures    Medications Ordered in ED Medications  fentaNYL (SUBLIMAZE) injection 50 mcg (50 mcg Intravenous Given 01/10/22 1128)  nitroGLYCERIN (NITROGLYN) 2 % ointment 1 inch (1 inch Topical Given 01/10/22 1219)    ED Course/ Medical Decision Making/ A&P                           Medical Decision Making Amount and/or Complexity of Data Reviewed Labs: ordered. Radiology: ordered.  Risk Prescription drug management.   79:28 PM 71 year old male  with past medical history of hypertension, hyperlipidemia, coronary artery disease, and CABG in 2020 presenting for complaints of chest pain.  Patient is alert and oriented x3, no acute distress, afebrile, stable vital signs.  Physical exam demonstrates equal bilateral breath sounds with no adventitious lung sounds.  Equal bilateral pulses.  EEG as interpreted by myself on 01/10/2022 at 11:07 AM demonstrates sinus rhythm with a rate of 54 bpm.  Q waves in inferior leads.  No ST segment elevation or depression.  Stable intervals. Troponin and delta trop wnl. Pt has improvement but persistent chest pressure. Will wait for cards eval prior to disposition.   Heart Score 6.  Pt signed out to oncoming provider Dr. Tinnie Gens while awaiting cards eval.         Final Clinical Impression(s) / ED Diagnoses Final diagnoses:  Chest pressure  H/O hyperlipidemia  H/O: hypertension  Hx of CABG    Rx / DC Orders ED Discharge Orders     None         Lianne Cure, DO 59/93/57 1523

## 2022-01-10 NOTE — Progress Notes (Signed)
ANTICOAGULATION CONSULT NOTE - Initial Consult  Pharmacy Consult for heparin Indication: chest pain/ACS  Allergies  Allergen Reactions   Codeine Anaphylaxis    Pt. verbalized that he has taken and can take Hydrocodone, Oxycodone w/o issue - codeine allergy only   Crestor [Rosuvastatin]     Myalgia    Isosorbide Nitrate Other (See Comments)    Headaches   Lipitor [Atorvastatin]     myalgia   Zetia [Ezetimibe]     MYALGIAS   Zolpidem Tartrate     REACTION: HA's   Ciprofloxacin Hives and Rash   Victoza [Liraglutide] Rash    Patient Measurements: Height: 5' 9.5" (176.5 cm) Weight: 93.4 kg (206 lb) IBW/kg (Calculated) : 71.85 Heparin Dosing Weight: 91kg  Vital Signs: Temp: 97.6 F (36.4 C) (07/08 1100) Temp Source: Oral (07/08 1100) BP: 128/66 (07/08 1515) Pulse Rate: 56 (07/08 1524)  Labs: Recent Labs    01/10/22 1107 01/10/22 1334  HGB 11.7*  --   HCT 36.8*  --   PLT 226  --   CREATININE 0.97  --   TROPONINIHS 4 8    Estimated Creatinine Clearance: 78.4 mL/min (by C-G formula based on SCr of 0.97 mg/dL).   Medical History: Past Medical History:  Diagnosis Date   CAD (coronary artery disease)    nuclear stress test-09/09/11 low risk scan EF61%, sees Dr. Tresa Endo    Chronic headache    Chronic neck pain    Diabetes mellitus    sees Dr. Lonzo Cloud   Hyperlipidemia    Hypertension 12/26/2008   ECHO- EF>55%   Insomnia     Assessment: 71 YOM presenting with CP, hx CAD s/p CABG, he is not on anticoagulation PTA  Goal of Therapy:  Heparin level 0.3-0.7 units/ml Monitor platelets by anticoagulation protocol: Yes   Plan:  Heparin 4000 units IV x 1, and gtt at 1200 units/hr F/u 8 hour heparin level F/u cath plans  Daylene Posey, PharmD Clinical Pharmacist ED Pharmacist Phone # [phone] 01/10/2022 5:03 PM

## 2022-01-10 NOTE — Progress Notes (Signed)
Pt complains of worsening nausea, Zofran helped intermittently. Cardiology paged.

## 2022-01-10 NOTE — ED Notes (Signed)
Pt provided with decaf coffee, water, and sandwich.

## 2022-01-11 ENCOUNTER — Inpatient Hospital Stay (HOSPITAL_COMMUNITY): Payer: Medicare Other

## 2022-01-11 DIAGNOSIS — R079 Chest pain, unspecified: Secondary | ICD-10-CM | POA: Diagnosis not present

## 2022-01-11 DIAGNOSIS — Z7984 Long term (current) use of oral hypoglycemic drugs: Secondary | ICD-10-CM | POA: Diagnosis not present

## 2022-01-11 DIAGNOSIS — Z79899 Other long term (current) drug therapy: Secondary | ICD-10-CM | POA: Diagnosis not present

## 2022-01-11 DIAGNOSIS — Z8249 Family history of ischemic heart disease and other diseases of the circulatory system: Secondary | ICD-10-CM | POA: Diagnosis not present

## 2022-01-11 DIAGNOSIS — Z951 Presence of aortocoronary bypass graft: Secondary | ICD-10-CM | POA: Diagnosis not present

## 2022-01-11 DIAGNOSIS — M549 Dorsalgia, unspecified: Secondary | ICD-10-CM | POA: Diagnosis present

## 2022-01-11 DIAGNOSIS — I200 Unstable angina: Secondary | ICD-10-CM | POA: Diagnosis present

## 2022-01-11 DIAGNOSIS — I2510 Atherosclerotic heart disease of native coronary artery without angina pectoris: Secondary | ICD-10-CM | POA: Diagnosis not present

## 2022-01-11 DIAGNOSIS — E785 Hyperlipidemia, unspecified: Secondary | ICD-10-CM | POA: Diagnosis present

## 2022-01-11 DIAGNOSIS — Z87891 Personal history of nicotine dependence: Secondary | ICD-10-CM | POA: Diagnosis not present

## 2022-01-11 DIAGNOSIS — E119 Type 2 diabetes mellitus without complications: Secondary | ICD-10-CM | POA: Diagnosis present

## 2022-01-11 DIAGNOSIS — G8929 Other chronic pain: Secondary | ICD-10-CM | POA: Diagnosis present

## 2022-01-11 DIAGNOSIS — Z885 Allergy status to narcotic agent status: Secondary | ICD-10-CM | POA: Diagnosis not present

## 2022-01-11 DIAGNOSIS — Z881 Allergy status to other antibiotic agents status: Secondary | ICD-10-CM | POA: Diagnosis not present

## 2022-01-11 DIAGNOSIS — R519 Headache, unspecified: Secondary | ICD-10-CM | POA: Diagnosis not present

## 2022-01-11 DIAGNOSIS — Z888 Allergy status to other drugs, medicaments and biological substances status: Secondary | ICD-10-CM | POA: Diagnosis not present

## 2022-01-11 DIAGNOSIS — R001 Bradycardia, unspecified: Secondary | ICD-10-CM | POA: Diagnosis not present

## 2022-01-11 DIAGNOSIS — I25110 Atherosclerotic heart disease of native coronary artery with unstable angina pectoris: Secondary | ICD-10-CM | POA: Diagnosis present

## 2022-01-11 DIAGNOSIS — Z7982 Long term (current) use of aspirin: Secondary | ICD-10-CM | POA: Diagnosis not present

## 2022-01-11 DIAGNOSIS — I10 Essential (primary) hypertension: Secondary | ICD-10-CM | POA: Diagnosis present

## 2022-01-11 LAB — BASIC METABOLIC PANEL
Anion gap: 11 (ref 5–15)
BUN: 13 mg/dL (ref 8–23)
CO2: 23 mmol/L (ref 22–32)
Calcium: 8.8 mg/dL — ABNORMAL LOW (ref 8.9–10.3)
Chloride: 106 mmol/L (ref 98–111)
Creatinine, Ser: 0.97 mg/dL (ref 0.61–1.24)
GFR, Estimated: >60 mL/min (ref >60)
Glucose, Bld: 149 mg/dL — ABNORMAL HIGH (ref 70–99)
Potassium: 3.8 mmol/L (ref 3.5–5.1)
Sodium: 140 mmol/L (ref 135–145)

## 2022-01-11 LAB — ECHOCARDIOGRAM COMPLETE
AR max vel: 2.84 cm2
AV Area VTI: 2.82 cm2
AV Area mean vel: 2.72 cm2
AV Mean grad: 7 mmHg
AV Peak grad: 12.7 mmHg
Ao pk vel: 1.78 m/s
Area-P 1/2: 5.27 cm2
Height: 69.5 in
S' Lateral: 3.3 cm
Weight: 3296 oz

## 2022-01-11 LAB — CBC
HCT: 35.1 % — ABNORMAL LOW (ref 39.0–52.0)
Hemoglobin: 11.5 g/dL — ABNORMAL LOW (ref 13.0–17.0)
MCH: 26.7 pg (ref 26.0–34.0)
MCHC: 32.8 g/dL (ref 30.0–36.0)
MCV: 81.4 fL (ref 80.0–100.0)
Platelets: 203 10*3/uL (ref 150–400)
RBC: 4.31 MIL/uL (ref 4.22–5.81)
RDW: 16.6 % — ABNORMAL HIGH (ref 11.5–15.5)
WBC: 7.8 10*3/uL (ref 4.0–10.5)
nRBC: 0 % (ref 0.0–0.2)

## 2022-01-11 LAB — LIPID PANEL
Cholesterol: 102 mg/dL (ref 0–200)
HDL: 43 mg/dL (ref >40)
LDL Cholesterol: 48 mg/dL (ref 0–99)
Total CHOL/HDL Ratio: 2.4 RATIO
Triglycerides: 57 mg/dL (ref <150)
VLDL: 11 mg/dL (ref 0–40)

## 2022-01-11 LAB — GLUCOSE, CAPILLARY
Glucose-Capillary: 139 mg/dL — ABNORMAL HIGH (ref 70–99)
Glucose-Capillary: 122 mg/dL — ABNORMAL HIGH (ref 70–99)
Glucose-Capillary: 185 mg/dL — ABNORMAL HIGH (ref 70–99)

## 2022-01-11 LAB — HEPARIN LEVEL (UNFRACTIONATED)
Heparin Unfractionated: 0.24 IU/mL — ABNORMAL LOW (ref 0.30–0.70)
Heparin Unfractionated: 0.38 IU/mL (ref 0.30–0.70)

## 2022-01-11 MED ORDER — SODIUM CHLORIDE 0.9 % IV SOLN: 250.0000 mL | INTRAVENOUS | Status: DC | PRN

## 2022-01-11 MED ORDER — SODIUM CHLORIDE 0.9% FLUSH: 3.0000 mL | INTRAVENOUS | Status: DC | PRN

## 2022-01-11 MED ORDER — SODIUM CHLORIDE 0.9 % WEIGHT BASED INFUSION
3.0000 mL/kg/h | INTRAVENOUS | Status: DC
  Administered 2022-01-11: 3 mL/kg/h via INTRAVENOUS

## 2022-01-11 MED ORDER — ASPIRIN 81 MG PO CHEW
81.0000 mg | CHEWABLE_TABLET | ORAL | Status: AC
  Administered 2022-01-12: 81 mg via ORAL
  Filled 2022-01-11: qty 1

## 2022-01-11 MED ORDER — SODIUM CHLORIDE 0.9% FLUSH
3.0000 mL | Freq: Two times a day (BID) | INTRAVENOUS | Status: DC
Start: 2022-01-11 — End: 2022-01-12
  Administered 2022-01-11: 3 mL via INTRAVENOUS

## 2022-01-11 MED ORDER — MELATONIN 5 MG PO TABS
10.0000 mg | ORAL_TABLET | Freq: Every day | ORAL | Status: DC
  Administered 2022-01-11 – 2022-01-12 (×2): 10 mg via ORAL
  Filled 2022-01-11 (×2): qty 2

## 2022-01-11 MED ORDER — PROCHLORPERAZINE EDISYLATE 10 MG/2ML IJ SOLN
5.0000 mg | Freq: Once | INTRAMUSCULAR | Status: AC
  Administered 2022-01-11: 5 mg via INTRAVENOUS
  Filled 2022-01-11: qty 2

## 2022-01-11 MED ORDER — SODIUM CHLORIDE 0.9 % WEIGHT BASED INFUSION
1.0000 mL/kg/h | INTRAVENOUS | Status: DC
  Administered 2022-01-12 (×2): 1 mL/kg/h via INTRAVENOUS

## 2022-01-11 NOTE — Progress Notes (Signed)
ANTICOAGULATION CONSULT NOTE   Pharmacy Consult for heparin Indication: chest pain/ACS  Allergies  Allergen Reactions   Codeine Anaphylaxis    Pt. verbalized that he has taken and can take Hydrocodone, Oxycodone w/o issue - codeine allergy only   Crestor [Rosuvastatin]     Myalgia    Isosorbide Nitrate Other (See Comments)    Headaches   Lipitor [Atorvastatin]     myalgia   Zetia [Ezetimibe]     MYALGIAS   Zolpidem Tartrate     REACTION: HA's   Ciprofloxacin Hives and Rash   Victoza [Liraglutide] Rash    Patient Measurements: Height: 5' 9.5" (176.5 cm) Weight: 93.4 kg (206 lb) IBW/kg (Calculated) : 71.85 Heparin Dosing Weight: 91kg  Vital Signs: Temp: 98.7 F (37.1 C) (07/09 0746) Temp Source: Oral (07/09 0746) BP: 121/67 (07/09 1025) Pulse Rate: 53 (07/09 0906)  Labs: Recent Labs    01/10/22 1107 01/10/22 1334 01/11/22 0029 01/11/22 1000  HGB 11.7*  --  11.5*  --   HCT 36.8*  --  35.1*  --   PLT 226  --  203  --   HEPARINUNFRC  --   --  0.24* 0.38  CREATININE 0.97  --  0.97  --   TROPONINIHS 4 8  --   --      Estimated Creatinine Clearance: 78.4 mL/min (by C-G formula based on SCr of 0.97 mg/dL).  Assessment: 29 YOM presenting with CP, hx CAD s/p CABG, he is not on anticoagulation PTA. Awaiting cardiac cath Monday 7/10. Pharmacy consulted to manage IV heparin.  Heparin level is therapeutic at 0.38 on 1400 units/hr. No bleeding noted. CBC stable.  Goal of Therapy:  Heparin level 0.3-0.7 units/ml Monitor platelets by anticoagulation protocol: Yes   Plan:  Continue heparin gtt at 1400 units/hr Daily heparin level and CBC Monitor for s/sx of bleeding LHC planned for tomorrow  Thank you for involving pharmacy in this patient's care.  Loura Back, PharmD, BCPS Clinical Pharmacist Clinical phone for 01/11/2022 until 3p is [phone] 01/11/2022 11:05 AM

## 2022-01-11 NOTE — Care Management Obs Status (Signed)
MEDICARE OBSERVATION STATUS NOTIFICATION   Patient Details  Name: YAZAN GATLING MRN: [id number] Date of Birth: [birth year]   Medicare Observation Status Notification Given:  Yes    Tien Aispuro G., RN 01/11/2022, 9:25 AM

## 2022-01-11 NOTE — Progress Notes (Signed)
Cardiology Progress Note  Patient ID: [name] MRN: [id number] DOB: 22-Feb-1951 Date of Encounter: 01/11/2022  Primary Cardiologist: Nicki Guadalajara, MD  Subjective   Chief Complaint: None.   HPI: Reports headache overnight.  No further chest pain.  Telemetry unremarkable.  Plan for left heart catheterization tomorrow.  ROS:  All other ROS reviewed and negative. Pertinent positives noted in the HPI.     Inpatient Medications  Scheduled Meds:  amLODipine  10 mg Oral Daily   aspirin EC  81 mg Oral Daily   clonazePAM  1 mg Oral QHS   dapagliflozin propanediol  10 mg Oral Daily   gabapentin  300 mg Oral Daily   gabapentin  600 mg Oral QHS   insulin aspart  0-9 Units Subcutaneous TID WC   lisinopril  40 mg Oral Daily   tamsulosin  0.4 mg Oral QPC supper   Continuous Infusions:  heparin 1,400 Units/hr (01/11/22 1030)   PRN Meds: acetaminophen, nitroGLYCERIN, ondansetron (ZOFRAN) IV   Vital Signs   Vitals:   01/11/22 0630 01/11/22 0746 01/11/22 0906 01/11/22 1025  BP: (!) 100/59 (!) 93/54 106/70 121/67  Pulse: (!) 50 (!) 50 (!) 53   Resp:  15 16   Temp:  98.7 F (37.1 C)    TempSrc:  Oral    SpO2: 93% 97% 94%   Weight:      Height:        Intake/Output Summary (Last 24 hours) at 01/11/2022 1122 Last data filed at 01/11/2022 0439 Gross per 24 hour  Intake 155.3 ml  Output 700 ml  Net -544.7 ml      01/10/2022   11:04 AM 12/02/2021   11:29 AM 11/26/2021    7:53 AM  Last 3 Weights  Weight (lbs) 206 lb 213 lb 215 lb 3.2 oz  Weight (kg) 93.441 kg 96.616 kg 97.614 kg      Telemetry  Overnight telemetry shows sinus bradycardia heart rate 40-50 bpm, which I personally reviewed.   ECG  The most recent ECG shows sinus bradycardia heart rate 49, no acute ischemic changes or evidence of infarction, which I personally reviewed.   Physical Exam   Vitals:   01/11/22 0630 01/11/22 0746 01/11/22 0906 01/11/22 1025  BP: (!) 100/59 (!) 93/54 106/70 121/67  Pulse: (!) 50  (!) 50 (!) 53   Resp:  15 16   Temp:  98.7 F (37.1 C)    TempSrc:  Oral    SpO2: 93% 97% 94%   Weight:      Height:        Intake/Output Summary (Last 24 hours) at 01/11/2022 1122 Last data filed at 01/11/2022 0439 Gross per 24 hour  Intake 155.3 ml  Output 700 ml  Net -544.7 ml       01/10/2022   11:04 AM 12/02/2021   11:29 AM 11/26/2021    7:53 AM  Last 3 Weights  Weight (lbs) 206 lb 213 lb 215 lb 3.2 oz  Weight (kg) 93.441 kg 96.616 kg 97.614 kg    Body mass index is 29.98 kg/m.  General: Well nourished, well developed, in no acute distress Head: Atraumatic, normal size  Eyes: PEERLA, EOMI  Neck: Supple, no JVD Endocrine: No thryomegaly Cardiac: Normal S1, S2; RRR; no murmurs, rubs, or gallops Lungs: Clear to auscultation bilaterally, no wheezing, rhonchi or rales  Abd: Soft, nontender, no hepatomegaly  Ext: No edema, pulses 2+ Musculoskeletal: No deformities, BUE and BLE strength normal and equal Skin: Warm and  dry, no rashes   Neuro: Alert and oriented to person, place, time, and situation, CNII-XII grossly intact, no focal deficits  Psych: Normal mood and affect   Labs  High Sensitivity Troponin:   Recent Labs  Lab 01/10/22 1107 01/10/22 1334  TROPONINIHS 4 8     Cardiac EnzymesNo results for input(s): "TROPONINI" in the last 168 hours. No results for input(s): "TROPIPOC" in the last 168 hours.  Chemistry Recent Labs  Lab 01/10/22 1107 01/11/22 0029  NA 142 140  K 3.7 3.8  CL 108 106  CO2 24 23  GLUCOSE 127* 149*  BUN 14 13  CREATININE 0.97 0.97  CALCIUM 8.8* 8.8*  GFRNONAA >60 >60  ANIONGAP 10 11    Hematology Recent Labs  Lab 01/10/22 1107 01/11/22 0029  WBC 6.3 7.8  RBC 4.40 4.31  HGB 11.7* 11.5*  HCT 36.8* 35.1*  MCV 83.6 81.4  MCH 26.6 26.7  MCHC 31.8 32.8  RDW 16.6* 16.6*  PLT 226 203   BNPNo results for input(s): "BNP", "PROBNP" in the last 168 hours.  DDimer  Recent Labs  Lab 01/10/22 1334  DDIMER 0.31     Radiology  DG  Chest Portable 1 View  Result Date: 01/10/2022 CLINICAL DATA:  Chest pain EXAM: PORTABLE CHEST 1 VIEW COMPARISON:  02/20/2019 FINDINGS: 1314 hours. Low volume film. The lungs are clear without focal pneumonia, edema, pneumothorax or pleural effusion. The cardio pericardial silhouette is enlarged. Telemetry leads overlie the chest. IMPRESSION: Low volume film without acute cardiopulmonary findings. Electronically Signed   By: Kennith Center M.D.   On: 01/10/2022 13:58    Cardiac Studies  Echo pending  Patient Profile  [name] is a 71 y.o. male with CAD status post CABG in 2002, hypertension, diabetes, hyperlipidemia who was admitted on 01/10/2022 with unstable angina.  Assessment & Plan   #Unstable angina #CAD status post CABG -Admitted with severely accelerated angina.  Taking nitroglycerin several times per day.  Now having chest symptoms at rest.  Symptoms improved yesterday with several rounds of nitroglycerin as well as Nitropaste. -EKG normal.  Troponins are negative.  Recent stress test with possible infarct in the inferior wall. -Known history of CABG.  Given history of CABG 20 years ago with accelerated angina he merits invasive angiography.  Continue heparin drip.  He is on aspirin.  Statin intolerant.  On home Praluent.  Plan for left heart catheterization tomorrow. -Normal TSH.  Lipids controlled. -Holding beta-blocker due to bradycardia. -Blood pressure stable. -Echo pending.  #HLD -Home Praluent  #Diabetes -Holding home oral agents.  Sliding scale insulin.  A1c 7.8.  #HTN -home agents. Hold BB.   FEN -pre cath IVF -code: full -diet: NPO at midnight -dvt ppx: heparin drip  For questions or updates, please contact [facility] Please consult [email or web address] for contact info under     Signed, Gerri Spore T. Flora Lipps, MD, [facility] [place]  [facility]  01/11/2022 11:22 AM

## 2022-01-11 NOTE — H&P (View-Only) (Signed)
Cardiology Progress Note  Patient ID: Rayshawn H Packett MRN: [id number] DOB: 12/24/1950 Date of Encounter: 01/11/2022  Primary Cardiologist: Thomas Kelly, MD  Subjective   Chief Complaint: None.   HPI: Reports headache overnight.  No further chest pain.  Telemetry unremarkable.  Plan for left heart catheterization tomorrow.  ROS:  All other ROS reviewed and negative. Pertinent positives noted in the HPI.     Inpatient Medications  Scheduled Meds:  amLODipine  10 mg Oral Daily   aspirin EC  81 mg Oral Daily   clonazePAM  1 mg Oral QHS   dapagliflozin propanediol  10 mg Oral Daily   gabapentin  300 mg Oral Daily   gabapentin  600 mg Oral QHS   insulin aspart  0-9 Units Subcutaneous TID WC   lisinopril  40 mg Oral Daily   tamsulosin  0.4 mg Oral QPC supper   Continuous Infusions:  heparin 1,400 Units/hr (01/11/22 1030)   PRN Meds: acetaminophen, nitroGLYCERIN, ondansetron (ZOFRAN) IV   Vital Signs   Vitals:   01/11/22 0630 01/11/22 0746 01/11/22 0906 01/11/22 1025  BP: (!) 100/59 (!) 93/54 106/70 121/67  Pulse: (!) 50 (!) 50 (!) 53   Resp:  15 16   Temp:  98.7 F (37.1 C)    TempSrc:  Oral    SpO2: 93% 97% 94%   Weight:      Height:        Intake/Output Summary (Last 24 hours) at 01/11/2022 1122 Last data filed at 01/11/2022 0439 Gross per 24 hour  Intake 155.3 ml  Output 700 ml  Net -544.7 ml      01/10/2022   11:04 AM 12/02/2021   11:29 AM 11/26/2021    7:53 AM  Last 3 Weights  Weight (lbs) 206 lb 213 lb 215 lb 3.2 oz  Weight (kg) 93.441 kg 96.616 kg 97.614 kg      Telemetry  Overnight telemetry shows sinus bradycardia heart rate 40-50 bpm, which I personally reviewed.   ECG  The most recent ECG shows sinus bradycardia heart rate 49, no acute ischemic changes or evidence of infarction, which I personally reviewed.   Physical Exam   Vitals:   01/11/22 0630 01/11/22 0746 01/11/22 0906 01/11/22 1025  BP: (!) 100/59 (!) 93/54 106/70 121/67  Pulse: (!) 50  (!) 50 (!) 53   Resp:  15 16   Temp:  98.7 F (37.1 C)    TempSrc:  Oral    SpO2: 93% 97% 94%   Weight:      Height:        Intake/Output Summary (Last 24 hours) at 01/11/2022 1122 Last data filed at 01/11/2022 0439 Gross per 24 hour  Intake 155.3 ml  Output 700 ml  Net -544.7 ml       01/10/2022   11:04 AM 12/02/2021   11:29 AM 11/26/2021    7:53 AM  Last 3 Weights  Weight (lbs) 206 lb 213 lb 215 lb 3.2 oz  Weight (kg) 93.441 kg 96.616 kg 97.614 kg    Body mass index is 29.98 kg/m.  General: Well nourished, well developed, in no acute distress Head: Atraumatic, normal size  Eyes: PEERLA, EOMI  Neck: Supple, no JVD Endocrine: No thryomegaly Cardiac: Normal S1, S2; RRR; no murmurs, rubs, or gallops Lungs: Clear to auscultation bilaterally, no wheezing, rhonchi or rales  Abd: Soft, nontender, no hepatomegaly  Ext: No edema, pulses 2+ Musculoskeletal: No deformities, BUE and BLE strength normal and equal Skin: Warm and   dry, no rashes   Neuro: Alert and oriented to person, place, time, and situation, CNII-XII grossly intact, no focal deficits  Psych: Normal mood and affect   Labs  High Sensitivity Troponin:   Recent Labs  Lab 01/10/22 1107 01/10/22 1334  TROPONINIHS 4 8     Cardiac EnzymesNo results for input(s): "TROPONINI" in the last 168 hours. No results for input(s): "TROPIPOC" in the last 168 hours.  Chemistry Recent Labs  Lab 01/10/22 1107 01/11/22 0029  NA 142 140  K 3.7 3.8  CL 108 106  CO2 24 23  GLUCOSE 127* 149*  BUN 14 13  CREATININE 0.97 0.97  CALCIUM 8.8* 8.8*  GFRNONAA >60 >60  ANIONGAP 10 11    Hematology Recent Labs  Lab 01/10/22 1107 01/11/22 0029  WBC 6.3 7.8  RBC 4.40 4.31  HGB 11.7* 11.5*  HCT 36.8* 35.1*  MCV 83.6 81.4  MCH 26.6 26.7  MCHC 31.8 32.8  RDW 16.6* 16.6*  PLT 226 203   BNPNo results for input(s): "BNP", "PROBNP" in the last 168 hours.  DDimer  Recent Labs  Lab 01/10/22 1334  DDIMER 0.31     Radiology  DG  Chest Portable 1 View  Result Date: 01/10/2022 CLINICAL DATA:  Chest pain EXAM: PORTABLE CHEST 1 VIEW COMPARISON:  02/20/2019 FINDINGS: 1314 hours. Low volume film. The lungs are clear without focal pneumonia, edema, pneumothorax or pleural effusion. The cardio pericardial silhouette is enlarged. Telemetry leads overlie the chest. IMPRESSION: Low volume film without acute cardiopulmonary findings. Electronically Signed   By: Kennith Center M.D.   On: 01/10/2022 13:58    Cardiac Studies  Echo pending  Patient Profile  Kealan DELSHAWN STECH is a 71 y.o. male with CAD status post CABG in 2002, hypertension, diabetes, hyperlipidemia who was admitted on 01/10/2022 with unstable angina.  Assessment & Plan   #Unstable angina #CAD status post CABG -Admitted with severely accelerated angina.  Taking nitroglycerin several times per day.  Now having chest symptoms at rest.  Symptoms improved yesterday with several rounds of nitroglycerin as well as Nitropaste. -EKG normal.  Troponins are negative.  Recent stress test with possible infarct in the inferior wall. -Known history of CABG.  Given history of CABG 20 years ago with accelerated angina he merits invasive angiography.  Continue heparin drip.  He is on aspirin.  Statin intolerant.  On home Praluent.  Plan for left heart catheterization tomorrow. -Normal TSH.  Lipids controlled. -Holding beta-blocker due to bradycardia. -Blood pressure stable. -Echo pending.  #HLD -Home Praluent  #Diabetes -Holding home oral agents.  Sliding scale insulin.  A1c 7.8.  #HTN -home agents. Hold BB.   FEN -pre cath IVF -code: full -diet: NPO at midnight -dvt ppx: heparin drip  For questions or updates, please contact [facility] Please consult [email or web address] for contact info under     Signed, Gerri Spore T. Flora Lipps, MD, [facility] [place]  [facility]  01/11/2022 11:22 AM

## 2022-01-11 NOTE — Progress Notes (Signed)
ANTICOAGULATION CONSULT NOTE   Pharmacy Consult for heparin Indication: chest pain/ACS  Allergies  Allergen Reactions   Codeine Anaphylaxis    Pt. verbalized that he has taken and can take Hydrocodone, Oxycodone w/o issue - codeine allergy only   Crestor [Rosuvastatin]     Myalgia    Isosorbide Nitrate Other (See Comments)    Headaches   Lipitor [Atorvastatin]     myalgia   Zetia [Ezetimibe]     MYALGIAS   Zolpidem Tartrate     REACTION: HA's   Ciprofloxacin Hives and Rash   Victoza [Liraglutide] Rash    Patient Measurements: Height: 5' 9.5" (176.5 cm) Weight: 93.4 kg (206 lb) IBW/kg (Calculated) : 71.85 Heparin Dosing Weight: 91kg  Vital Signs: Temp: 97.7 F (36.5 C) (07/09 0012) Temp Source: Oral (07/09 0012) BP: 121/64 (07/09 0012) Pulse Rate: 47 (07/09 0012)  Labs: Recent Labs    01/10/22 1107 01/10/22 1334 01/11/22 0029  HGB 11.7*  --  11.5*  HCT 36.8*  --  35.1*  PLT 226  --  203  HEPARINUNFRC  --   --  0.24*  CREATININE 0.97  --  0.97  TROPONINIHS 4 8  --      Estimated Creatinine Clearance: 78.4 mL/min (by C-G formula based on SCr of 0.97 mg/dL).   Medical History: Past Medical History:  Diagnosis Date   CAD (coronary artery disease)    nuclear stress test-09/09/11 low risk scan EF61%, sees Dr. Tresa Endo    Chronic headache    Chronic neck pain    Diabetes mellitus    sees Dr. Lonzo Cloud   Hyperlipidemia    Hypertension 12/26/2008   ECHO- EF>55%   Insomnia     Assessment: 71 YOM presenting with CP, hx CAD s/p CABG, he is not on anticoagulation PTA. Awaiting cardia cath Monday 7/10  Initial heparin level below goal 0.24, no issues with infusion or overt s/sx of bleeding; cbc stable WNL  Goal of Therapy:  Heparin level 0.3-0.7 units/ml Monitor platelets by anticoagulation protocol: Yes   Plan:  Increase heparin gtt to 1400 units/hr F/u 8 hour heparin level Daily heparin level and CBC  Ruben Im, PharmD Clinical  Pharmacist 01/11/2022 1:44 AM Please check AMION for all [facility] numbers

## 2022-01-11 NOTE — Progress Notes (Addendum)
CCMD called reporting a 21  beat run of VTACH.    On assessment VSS pt denies any CP, SOB. Will report to day shift.   Cardiology aware.

## 2022-01-11 NOTE — Progress Notes (Signed)
  Echocardiogram 2D Echocardiogram has been performed.  [name] 01/11/2022, 5:37 PM

## 2022-01-12 ENCOUNTER — Encounter (HOSPITAL_COMMUNITY)
Admission: EM | Disposition: A | Discharge status: Home / Self Care | Payer: Self-pay | Source: Home / Self Care | Attending: Cardiovascular Disease

## 2022-01-12 ENCOUNTER — Encounter (HOSPITAL_COMMUNITY): Payer: Self-pay | Admitting: Cardiovascular Disease

## 2022-01-12 DIAGNOSIS — E785 Hyperlipidemia, unspecified: Secondary | ICD-10-CM

## 2022-01-12 DIAGNOSIS — E119 Type 2 diabetes mellitus without complications: Secondary | ICD-10-CM

## 2022-01-12 DIAGNOSIS — I200 Unstable angina: Secondary | ICD-10-CM | POA: Diagnosis not present

## 2022-01-12 DIAGNOSIS — I10 Essential (primary) hypertension: Secondary | ICD-10-CM

## 2022-01-12 DIAGNOSIS — I2510 Atherosclerotic heart disease of native coronary artery without angina pectoris: Secondary | ICD-10-CM

## 2022-01-12 HISTORY — PX: CORONARY STENT INTERVENTION: CATH118234

## 2022-01-12 HISTORY — PX: INTRAVASCULAR PRESSURE WIRE/FFR STUDY: CATH118243

## 2022-01-12 HISTORY — PX: LEFT HEART CATH AND CORS/GRAFTS ANGIOGRAPHY: CATH118250

## 2022-01-12 LAB — POCT ACTIVATED CLOTTING TIME
Activated Clotting Time: 245 seconds
Activated Clotting Time: 257 seconds
Activated Clotting Time: 263 seconds
Activated Clotting Time: 173 seconds
Activated Clotting Time: 197 seconds

## 2022-01-12 LAB — CBC
HCT: 36.1 % — ABNORMAL LOW (ref 39.0–52.0)
Hemoglobin: 11.7 g/dL — ABNORMAL LOW (ref 13.0–17.0)
MCH: 26.8 pg (ref 26.0–34.0)
MCHC: 32.4 g/dL (ref 30.0–36.0)
MCV: 82.8 fL (ref 80.0–100.0)
Platelets: 192 10*3/uL (ref 150–400)
RBC: 4.36 MIL/uL (ref 4.22–5.81)
RDW: 16.7 % — ABNORMAL HIGH (ref 11.5–15.5)
WBC: 12.3 10*3/uL — ABNORMAL HIGH (ref 4.0–10.5)
nRBC: 0 % (ref 0.0–0.2)

## 2022-01-12 LAB — BASIC METABOLIC PANEL
Anion gap: 11 (ref 5–15)
BUN: 12 mg/dL (ref 8–23)
CO2: 21 mmol/L — ABNORMAL LOW (ref 22–32)
Calcium: 8.5 mg/dL — ABNORMAL LOW (ref 8.9–10.3)
Chloride: 110 mmol/L (ref 98–111)
Creatinine, Ser: 0.94 mg/dL (ref 0.61–1.24)
GFR, Estimated: >60 mL/min (ref >60)
Glucose, Bld: 109 mg/dL — ABNORMAL HIGH (ref 70–99)
Potassium: 3.5 mmol/L (ref 3.5–5.1)
Sodium: 142 mmol/L (ref 135–145)

## 2022-01-12 LAB — GLUCOSE, CAPILLARY
Glucose-Capillary: 130 mg/dL — ABNORMAL HIGH (ref 70–99)
Glucose-Capillary: 123 mg/dL — ABNORMAL HIGH (ref 70–99)
Glucose-Capillary: 112 mg/dL — ABNORMAL HIGH (ref 70–99)
Glucose-Capillary: 161 mg/dL — ABNORMAL HIGH (ref 70–99)
Glucose-Capillary: 117 mg/dL — ABNORMAL HIGH (ref 70–99)

## 2022-01-12 LAB — HEPARIN LEVEL (UNFRACTIONATED)
Heparin Unfractionated: 0.21 IU/mL — ABNORMAL LOW (ref 0.30–0.70)
Heparin Unfractionated: 0.48 IU/mL (ref 0.30–0.70)

## 2022-01-12 SURGERY — LEFT HEART CATH AND CORS/GRAFTS ANGIOGRAPHY
Anesthesia: LOCAL

## 2022-01-12 MED ORDER — NEBIVOLOL HCL 5 MG PO TABS
5.0000 mg | ORAL_TABLET | Freq: Every day | ORAL | Status: DC
  Administered 2022-01-12 – 2022-01-13 (×2): 5 mg via ORAL
  Filled 2022-01-12 (×2): qty 1

## 2022-01-12 MED ORDER — HEPARIN SODIUM (PORCINE) 1000 UNIT/ML IJ SOLN
INTRAMUSCULAR | Status: DC | PRN
  Administered 2022-01-12: 2500 [IU] via INTRAVENOUS
  Administered 2022-01-12: 3000 [IU] via INTRAVENOUS
  Administered 2022-01-12: 10000 [IU] via INTRAVENOUS

## 2022-01-12 MED ORDER — FAMOTIDINE IN NACL 20-0.9 MG/50ML-% IV SOLN
INTRAVENOUS | Status: AC | PRN
  Administered 2022-01-12: 20 mg via INTRAVENOUS

## 2022-01-12 MED ORDER — ACETAMINOPHEN 325 MG PO TABS
650.0000 mg | ORAL_TABLET | ORAL | Status: DC | PRN
Start: 2022-01-12 — End: 2022-01-12

## 2022-01-12 MED ORDER — HEPARIN (PORCINE) IN NACL 1000-0.9 UT/500ML-% IV SOLN
INTRAVENOUS | Status: AC
  Filled 2022-01-12: qty 1000

## 2022-01-12 MED ORDER — ASPIRIN 81 MG PO CHEW: 81.0000 mg | CHEWABLE_TABLET | Freq: Every day | ORAL | Status: DC

## 2022-01-12 MED ORDER — LIDOCAINE HCL (PF) 1 % IJ SOLN
INTRAMUSCULAR | Status: DC | PRN
  Administered 2022-01-12: 15 mL

## 2022-01-12 MED ORDER — HYDRALAZINE HCL 20 MG/ML IJ SOLN: 10.0000 mg | INTRAMUSCULAR | Status: AC | PRN

## 2022-01-12 MED ORDER — HEPARIN SODIUM (PORCINE) 1000 UNIT/ML IJ SOLN
INTRAMUSCULAR | Status: AC
  Filled 2022-01-12: qty 10

## 2022-01-12 MED ORDER — AMLODIPINE BESYLATE 5 MG PO TABS
5.0000 mg | ORAL_TABLET | Freq: Every day | ORAL | Status: DC
  Administered 2022-01-13: 5 mg via ORAL
  Filled 2022-01-12: qty 1

## 2022-01-12 MED ORDER — ONDANSETRON HCL 4 MG/2ML IJ SOLN: 4.0000 mg | Freq: Four times a day (QID) | INTRAMUSCULAR | Status: DC | PRN

## 2022-01-12 MED ORDER — FENTANYL CITRATE (PF) 100 MCG/2ML IJ SOLN
INTRAMUSCULAR | Status: AC
  Filled 2022-01-12: qty 2

## 2022-01-12 MED ORDER — LISINOPRIL 20 MG PO TABS
40.0000 mg | ORAL_TABLET | Freq: Every day | ORAL | Status: DC
  Administered 2022-01-13: 40 mg via ORAL
  Filled 2022-01-12: qty 2

## 2022-01-12 MED ORDER — MIDAZOLAM HCL 2 MG/2ML IJ SOLN
INTRAMUSCULAR | Status: AC
  Filled 2022-01-12: qty 2

## 2022-01-12 MED ORDER — LABETALOL HCL 5 MG/ML IV SOLN: 10.0000 mg | INTRAVENOUS | Status: AC | PRN

## 2022-01-12 MED ORDER — SODIUM CHLORIDE 0.9% FLUSH
3.0000 mL | Freq: Two times a day (BID) | INTRAVENOUS | Status: DC
  Administered 2022-01-13: 3 mL via INTRAVENOUS

## 2022-01-12 MED ORDER — HEPARIN BOLUS VIA INFUSION
1000.0000 [IU] | Freq: Once | INTRAVENOUS | Status: AC
  Administered 2022-01-12: 1000 [IU] via INTRAVENOUS
  Filled 2022-01-12: qty 1000

## 2022-01-12 MED ORDER — CLOPIDOGREL BISULFATE 75 MG PO TABS
75.0000 mg | ORAL_TABLET | Freq: Every day | ORAL | Status: DC
  Administered 2022-01-13: 75 mg via ORAL
  Filled 2022-01-12: qty 1

## 2022-01-12 MED ORDER — FAMOTIDINE IN NACL 20-0.9 MG/50ML-% IV SOLN
INTRAVENOUS | Status: AC
  Filled 2022-01-12: qty 50

## 2022-01-12 MED ORDER — HEPARIN (PORCINE) IN NACL 1000-0.9 UT/500ML-% IV SOLN
INTRAVENOUS | Status: DC | PRN
  Administered 2022-01-12 (×3): 500 mL

## 2022-01-12 MED ORDER — CLOPIDOGREL BISULFATE 300 MG PO TABS
ORAL_TABLET | ORAL | Status: DC | PRN
  Administered 2022-01-12: 600 mg via ORAL

## 2022-01-12 MED ORDER — SODIUM CHLORIDE 0.9 % IV SOLN: INTRAVENOUS | Status: AC

## 2022-01-12 MED ORDER — IOHEXOL 350 MG/ML SOLN
INTRAVENOUS | Status: DC | PRN
  Administered 2022-01-12: 180 mL

## 2022-01-12 MED ORDER — GLIPIZIDE 10 MG PO TABS
5.0000 mg | ORAL_TABLET | Freq: Two times a day (BID) | ORAL | Status: DC
  Administered 2022-01-12 – 2022-01-13 (×2): 5 mg via ORAL
  Filled 2022-01-12 (×2): qty 1

## 2022-01-12 MED ORDER — CLOPIDOGREL BISULFATE 300 MG PO TABS
ORAL_TABLET | ORAL | Status: AC
  Filled 2022-01-12: qty 1

## 2022-01-12 MED ORDER — SODIUM CHLORIDE 0.9 % IV SOLN
250.0000 mL | INTRAVENOUS | Status: DC | PRN
Start: 2022-01-12 — End: 2022-01-13

## 2022-01-12 MED ORDER — ASPIRIN 81 MG PO CHEW
81.0000 mg | CHEWABLE_TABLET | Freq: Every day | ORAL | Status: DC
  Administered 2022-01-13: 81 mg via ORAL
  Filled 2022-01-12: qty 1

## 2022-01-12 MED ORDER — FENTANYL CITRATE (PF) 100 MCG/2ML IJ SOLN
INTRAMUSCULAR | Status: DC | PRN
  Administered 2022-01-12: 25 ug via INTRAVENOUS

## 2022-01-12 MED ORDER — LIDOCAINE HCL (PF) 1 % IJ SOLN
INTRAMUSCULAR | Status: AC
  Filled 2022-01-12: qty 30

## 2022-01-12 MED ORDER — MIDAZOLAM HCL 2 MG/2ML IJ SOLN
INTRAMUSCULAR | Status: DC | PRN
  Administered 2022-01-12: 1 mg via INTRAVENOUS

## 2022-01-12 MED ORDER — SODIUM CHLORIDE 0.9% FLUSH: 3.0000 mL | INTRAVENOUS | Status: DC | PRN

## 2022-01-12 MED ORDER — DAPAGLIFLOZIN PROPANEDIOL 10 MG PO TABS: 10.0000 mg | ORAL_TABLET | Freq: Every day | ORAL | Status: DC

## 2022-01-12 SURGICAL SUPPLY — 22 items
BALLN SAPPHIRE 2.0X12 (BALLOONS) ×2
BALLOON SAPPHIRE 2.0X12 (BALLOONS) IMPLANT
CATH INFINITI 5 FR IM (CATHETERS) ×1 IMPLANT
CATH INFINITI 5 FR LCB (CATHETERS) ×1 IMPLANT
CATH INFINITI 5FR MULTPACK ANG (CATHETERS) ×1 IMPLANT
CATH VISTA GUIDE 6FR XB3 (CATHETERS) ×1 IMPLANT
ELECT DEFIB PAD ADLT CADENCE (PAD) ×1 IMPLANT
GUIDEWIRE PRESSURE X 175 (WIRE) ×1 IMPLANT
KIT ENCORE 26 ADVANTAGE (KITS) ×1 IMPLANT
KIT HEART LEFT (KITS) ×2 IMPLANT
KIT MICROPUNCTURE NIT STIFF (SHEATH) ×1 IMPLANT
PACK CARDIAC CATHETERIZATION (CUSTOM PROCEDURE TRAY) ×2 IMPLANT
SHEATH PINNACLE 5F 10CM (SHEATH) ×1 IMPLANT
SHEATH PINNACLE 6F 10CM (SHEATH) ×1 IMPLANT
SHEATH PROBE COVER 6X72 (BAG) ×1 IMPLANT
STENT SYNERGY XD 2.50X12 (Permanent Stent) IMPLANT
SYNERGY XD 2.50X12 (Permanent Stent) ×2 IMPLANT
TRANSDUCER W/STOPCOCK (MISCELLANEOUS) ×2 IMPLANT
TUBING CIL FLEX 10 FLL-RA (TUBING) ×2 IMPLANT
WIRE ASAHI PROWATER 180CM (WIRE) ×1 IMPLANT
WIRE EMERALD 3MM-J .035X150CM (WIRE) ×1 IMPLANT
WIRE EMERALD 3MM-J .035X260CM (WIRE) ×1 IMPLANT

## 2022-01-12 NOTE — Progress Notes (Addendum)
Site area: Right groin a 6 french arterial sheath was removed  Rochele Pages  Site Prior to Removal:  Level 0  Pressure Applied For 30 MINUTES    Bedrest Beginning at 1300pm X 4 hours  Manual:   Yes.    Patient Status During Pull:  stable  Post Pull Groin Site:  Level 0  Post Pull Instructions Given:  Yes.    Post Pull Pulses Present:  Yes.    Dressing Applied:  Yes.    Comments:

## 2022-01-12 NOTE — Progress Notes (Addendum)
The patient has been seen in conjunction with Geoffry Paradise NP. All aspects of care have been considered and discussed. The patient has been personally interviewed, examined, and all clinical data has been reviewed.  Recurrent chest discomfort. Cardiac images reviewed in Cath Lab with Dr. Allyson Sabal.  Diffuse distal RCA disease of the PDA into the proximal territory supplied.  Diffuse disease throughout the diagonal territory no focal disease amenable to PCI.  There is eccentric significant mid circumflex disease.  The distal circumflex supplies collaterals to the coronary.  RFR was positive stent was placed in the circumflex. Plan: Continue antianginal therapy.  Continue dual antiplatelet therapy.  Ambulate later this evening and probable discharge in the a.m.       Progress Note  Patient Name: [name] Date of Encounter: 01/12/2022  [facility] Cardiologist: Nicki Guadalajara, MD   Subjective   Going to cath lab.  Inpatient Medications    Scheduled Meds:  amLODipine  10 mg Oral Daily   aspirin EC  81 mg Oral Daily   clonazePAM  1 mg Oral QHS   dapagliflozin propanediol  10 mg Oral Daily   gabapentin  300 mg Oral Daily   gabapentin  600 mg Oral QHS   insulin aspart  0-9 Units Subcutaneous TID WC   lisinopril  40 mg Oral Daily   melatonin  10 mg Oral QHS   sodium chloride flush  3 mL Intravenous Q12H   tamsulosin  0.4 mg Oral QPC supper   Continuous Infusions:  sodium chloride     sodium chloride 1 mL/kg/hr (01/12/22 0753)   heparin 1,600 Units/hr (01/12/22 0530)   PRN Meds: sodium chloride, acetaminophen, nitroGLYCERIN, ondansetron (ZOFRAN) IV, sodium chloride flush   Vital Signs    Vitals:   01/11/22 1404 01/11/22 2001 01/12/22 0418 01/12/22 0749  BP: (!) 115/59 130/71 126/62 106/64  Pulse: 61 81  60  Resp: 16 18 18 20   Temp: 97.8 F (36.6 C) 98.8 F (37.1 C) 99.6 F (37.6 C) 99.3 F (37.4 C)  TempSrc: Oral Oral Oral Oral  SpO2: 95% 95% 95% 98%   Weight:      Height:        Intake/Output Summary (Last 24 hours) at 01/12/2022 0816 Last data filed at 01/12/2022 0509 Gross per 24 hour  Intake 1698.55 ml  Output --  Net 1698.55 ml      01/10/2022   11:04 AM 12/02/2021   11:29 AM 11/26/2021    7:53 AM  Last 3 Weights  Weight (lbs) 206 lb 213 lb 215 lb 3.2 oz  Weight (kg) 93.441 kg 96.616 kg 97.614 kg      Telemetry    Sinus Rhythm/bradycardia - Personally Reviewed  ECG    No new tracing this morning  Physical Exam   GEN: No acute distress.   Neck: No JVD Cardiac: RRR, + systolic murmur, no rubs, or gallops.  Respiratory: Clear to auscultation bilaterally. GI: Soft, nontender, non-distended  MS: No edema; No deformity. Neuro:  Nonfocal  Psych: Normal affect   Labs    High Sensitivity Troponin:   Recent Labs  Lab 01/10/22 1107 01/10/22 1334  TROPONINIHS 4 8     Chemistry Recent Labs  Lab 01/10/22 1107 01/11/22 0029  NA 142 140  K 3.7 3.8  CL 108 106  CO2 24 23  GLUCOSE 127* 149*  BUN 14 13  CREATININE 0.97 0.97  CALCIUM 8.8* 8.8*  GFRNONAA >60 >60  ANIONGAP 10 11  Lipids  Recent Labs  Lab 01/11/22 0029  CHOL 102  TRIG 57  HDL 43  LDLCALC 48  CHOLHDL 2.4    Hematology Recent Labs  Lab 01/10/22 1107 01/11/22 0029 01/12/22 0149  WBC 6.3 7.8 12.3*  RBC 4.40 4.31 4.36  HGB 11.7* 11.5* 11.7*  HCT 36.8* 35.1* 36.1*  MCV 83.6 81.4 82.8  MCH 26.6 26.7 26.8  MCHC 31.8 32.8 32.4  RDW 16.6* 16.6* 16.7*  PLT 226 203 192   Thyroid  Recent Labs  Lab 01/10/22 1107  TSH 2.702    BNPNo results for input(s): "BNP", "PROBNP" in the last 168 hours.  DDimer  Recent Labs  Lab 01/10/22 1334  DDIMER 0.31     Radiology    ECHOCARDIOGRAM COMPLETE  Result Date: 01/11/2022    ECHOCARDIOGRAM REPORT   Patient Name:   [name] Date of Exam: 01/11/2022 Medical Rec #:  [id number]         Height:       69.5 in Accession #:    [id number]        Weight:       206.0 lb Date of Birth:   March 20, 1951         BSA:          2.102 m Patient Age:    71 years          BP:           115/59 mmHg Patient Gender: M                 HR:           88 bpm. Exam Location:  Inpatient Procedure: 2D Echo, Cardiac Doppler and Color Doppler Indications:    Chest pain  History:        Patient has prior history of Echocardiogram examinations, most                 recent 02/05/2020. CAD, Prior CABG; Risk Factors:Hypertension,                 Diabetes and Dyslipidemia.  Sonographer:    Merrie Roof RDCS Referring Phys: [id number] [facility] IMPRESSIONS  1. Left ventricular ejection fraction, by estimation, is 60 to 65%. The left ventricle has normal function. The left ventricle has no regional wall motion abnormalities. There is mild left ventricular hypertrophy. Left ventricular diastolic parameters are consistent with Grade I diastolic dysfunction (impaired relaxation).  2. Right ventricular systolic function is normal. The right ventricular size is normal. Tricuspid regurgitation signal is inadequate for assessing PA pressure.  3. Left atrial size was severely dilated.  4. MR appears functional. The mitral valve is abnormal. Mild to moderate mitral valve regurgitation.  5. The aortic valve is tricuspid. Aortic valve regurgitation is not visualized. Aortic valve sclerosis is present, with no evidence of aortic valve stenosis.  6. The inferior vena cava is normal in size with greater than 50% respiratory variability, suggesting right atrial pressure of 3 mmHg.  7. Evidence of atrial level shunting detected by color flow Doppler. There is a small patent foramen ovale with predominantly left to right shunting across the atrial septum. Comparison(s): Changes from prior study are noted. 02/05/2020: LVEF 55-60%, mild MR, grade 2 DD. FINDINGS  Left Ventricle: Left ventricular ejection fraction, by estimation, is 60 to 65%. The left ventricle has normal function. The left ventricle has no regional wall motion abnormalities. The left  ventricular internal cavity size was normal in size.  There is  mild left ventricular hypertrophy. Left ventricular diastolic parameters are consistent with Grade I diastolic dysfunction (impaired relaxation). Indeterminate filling pressures. Right Ventricle: The right ventricular size is normal. No increase in right ventricular wall thickness. Right ventricular systolic function is normal. Tricuspid regurgitation signal is inadequate for assessing PA pressure. Left Atrium: Left atrial size was severely dilated. Right Atrium: Right atrial size was normal in size. Pericardium: There is no evidence of pericardial effusion. Mitral Valve: MR appears functional. The mitral valve is abnormal. There is mild thickening of the anterior and posterior mitral valve leaflet(s). Mild to moderate mitral valve regurgitation, with centrally-directed jet. Tricuspid Valve: The tricuspid valve is grossly normal. Tricuspid valve regurgitation is trivial. Aortic Valve: The aortic valve is tricuspid. Aortic valve regurgitation is not visualized. Aortic valve sclerosis is present, with no evidence of aortic valve stenosis. Aortic valve mean gradient measures 7.0 mmHg. Aortic valve peak gradient measures 12.7 mmHg. Aortic valve area, by VTI measures 2.82 cm. Pulmonic Valve: The pulmonic valve was grossly normal. Pulmonic valve regurgitation is trivial. Aorta: The aortic root and ascending aorta are structurally normal, with no evidence of dilitation. Venous: The inferior vena cava is normal in size with greater than 50% respiratory variability, suggesting right atrial pressure of 3 mmHg. IAS/Shunts: Evidence of atrial level shunting detected by color flow Doppler. A small patent foramen ovale is detected with predominantly left to right shunting across the atrial septum.  LEFT VENTRICLE PLAX 2D LVIDd:         5.00 cm   Diastology LVIDs:         3.30 cm   LV e' medial:    7.40 cm/s LV PW:         1.30 cm   LV E/e' medial:  13.5 LV IVS:         1.00 cm   LV e' lateral:   11.00 cm/s LVOT diam:     2.20 cm   LV E/e' lateral: 9.1 LV SV:         97 LV SV Index:   46 LVOT Area:     3.80 cm  RIGHT VENTRICLE RV Basal diam:  3.60 cm RV S prime:     13.70 cm/s TAPSE (M-mode): 1.8 cm LEFT ATRIUM              Index        RIGHT ATRIUM           Index LA diam:        5.30 cm  2.52 cm/m   RA Area:     19.70 cm LA Vol (A2C):   105.0 ml 49.94 ml/m  RA Volume:   51.80 ml  24.64 ml/m LA Vol (A4C):   125.0 ml 59.45 ml/m LA Biplane Vol: 117.0 ml 55.65 ml/m  AORTIC VALVE AV Area (Vmax):    2.84 cm AV Area (Vmean):   2.72 cm AV Area (VTI):     2.82 cm AV Vmax:           178.00 cm/s AV Vmean:          121.000 cm/s AV VTI:            0.344 m AV Peak Grad:      12.7 mmHg AV Mean Grad:      7.0 mmHg LVOT Vmax:         133.00 cm/s LVOT Vmean:        86.500 cm/s LVOT VTI:  0.255 m LVOT/AV VTI ratio: 0.74  AORTA Ao Root diam: 3.50 cm Ao Asc diam:  3.50 cm MITRAL VALVE MV Area (PHT): 5.27 cm     SHUNTS MV Decel Time: 144 msec     Systemic VTI:  0.26 m MV E velocity: 100.00 cm/s  Systemic Diam: 2.20 cm MV A velocity: 93.80 cm/s MV E/A ratio:  1.07 Lyman Bishop MD Electronically signed by Lyman Bishop MD Signature Date/Time: 01/11/2022/6:52:27 PM    Final    DG Chest Portable 1 View  Result Date: 01/10/2022 CLINICAL DATA:  Chest pain EXAM: PORTABLE CHEST 1 VIEW COMPARISON:  02/20/2019 FINDINGS: 1314 hours. Low volume film. The lungs are clear without focal pneumonia, edema, pneumothorax or pleural effusion. The cardio pericardial silhouette is enlarged. Telemetry leads overlie the chest. IMPRESSION: Low volume film without acute cardiopulmonary findings. Electronically Signed   By: Misty Stanley M.D.   On: 01/10/2022 13:58    Cardiac Studies   Echo: 01/11/22   IMPRESSIONS     1. Left ventricular ejection fraction, by estimation, is 60 to 65%. The  left ventricle has normal function. The left ventricle has no regional  wall motion abnormalities. There is  mild left ventricular hypertrophy.  Left ventricular diastolic parameters  are consistent with Grade I diastolic dysfunction (impaired relaxation).   2. Right ventricular systolic function is normal. The right ventricular  size is normal. Tricuspid regurgitation signal is inadequate for assessing  PA pressure.   3. Left atrial size was severely dilated.   4. MR appears functional. The mitral valve is abnormal. Mild to moderate  mitral valve regurgitation.   5. The aortic valve is tricuspid. Aortic valve regurgitation is not  visualized. Aortic valve sclerosis is present, with no evidence of aortic  valve stenosis.   6. The inferior vena cava is normal in size with greater than 50%  respiratory variability, suggesting right atrial pressure of 3 mmHg.   7. Evidence of atrial level shunting detected by color flow Doppler.  There is a small patent foramen ovale with predominantly left to right  shunting across the atrial septum.   Comparison(s): Changes from prior study are noted. 02/05/2020: LVEF 55-60%,  mild MR, grade 2 DD.   FINDINGS   Left Ventricle: Left ventricular ejection fraction, by estimation, is 60  to 65%. The left ventricle has normal function. The left ventricle has no  regional wall motion abnormalities. The left ventricular internal cavity  size was normal in size. There is   mild left ventricular hypertrophy. Left ventricular diastolic parameters  are consistent with Grade I diastolic dysfunction (impaired relaxation).  Indeterminate filling pressures.   Right Ventricle: The right ventricular size is normal. No increase in  right ventricular wall thickness. Right ventricular systolic function is  normal. Tricuspid regurgitation signal is inadequate for assessing PA  pressure.   Left Atrium: Left atrial size was severely dilated.   Right Atrium: Right atrial size was normal in size.   Pericardium: There is no evidence of pericardial effusion.   Mitral Valve: MR  appears functional. The mitral valve is abnormal. There  is mild thickening of the anterior and posterior mitral valve leaflet(s).  Mild to moderate mitral valve regurgitation, with centrally-directed jet.   Tricuspid Valve: The tricuspid valve is grossly normal. Tricuspid valve  regurgitation is trivial.   Aortic Valve: The aortic valve is tricuspid. Aortic valve regurgitation is  not visualized. Aortic valve sclerosis is present, with no evidence of  aortic valve stenosis. Aortic valve  mean gradient measures 7.0 mmHg.  Aortic valve peak gradient measures  12.7 mmHg. Aortic valve area, by VTI measures 2.82 cm.   Pulmonic Valve: The pulmonic valve was grossly normal. Pulmonic valve  regurgitation is trivial.   Aorta: The aortic root and ascending aorta are structurally normal, with  no evidence of dilitation.   Venous: The inferior vena cava is normal in size with greater than 50%  respiratory variability, suggesting right atrial pressure of 3 mmHg.   IAS/Shunts: Evidence of atrial level shunting detected by color flow  Doppler. A small patent foramen ovale is detected with predominantly left  to right shunting across the atrial septum.       Patient Profile     72 y.o. male  with CAD status post CABG in 2002, hypertension, diabetes, hyperlipidemia who was admitted on 01/10/2022 with unstable angina.  Assessment & Plan    Unstable angina CAD status post CABG -- Admitted with severely accelerated angina.  Taking nitroglycerin several times per day, and chest pain while at rest. Symptoms improved with several rounds of nitroglycerin as well as Nitropaste. EKG normal.  Troponins are negative.  Recent stress test with possible infarct in the inferior wall. -- Known history of CABG.  Given history of CABG 20 years ago with accelerated angina, plan for cardiac cath.  Echocardiogram showed LVEF of 60 to 65%, no regional wall motion abnormality, grade 1 diastolic dysfunction, normal RV,  severely dilated left atrium. -- Continue heparin drip, aspirin.  He is statin intolerant.  On home Praluent. -- Holding beta-blocker due to bradycardia.   HLD: LDL 48, HDL 43 -- on home Praluent   Diabetes: Hgb A1c 7.8 -- Holding home oral agents   -- Sliding scale insulin    HTN: stable -- continue amlodipine 10 mg daily, lisinopril 40 mg daily  Mild to moderate MR: Appears functional on echo    For questions or updates, please contact [facility] Please consult [email or web address] for contact info under        Signed, Laverda Page, NP  01/12/2022, 8:16 AM

## 2022-01-12 NOTE — Interval H&P Note (Signed)
Cath Lab Visit (complete for each Cath Lab visit)  Clinical Evaluation Leading to the Procedure:   ACS: Yes.    Non-ACS:    Anginal Classification: CCS II  Anti-ischemic medical therapy: Maximal Therapy (2 or more classes of medications)  Non-Invasive Test Results: No non-invasive testing performed  Prior CABG: Previous CABG      History and Physical Interval Note:  01/12/2022 8:40 AM  [name]  has presented today for surgery, with the diagnosis of unstable angina.  The various methods of treatment have been discussed with the patient and family. After consideration of risks, benefits and other options for treatment, the patient has consented to  Procedure(s): LEFT HEART CATH AND CORS/GRAFTS ANGIOGRAPHY (N/A) as a surgical intervention.  The patient's history has been reviewed, patient examined, no change in status, stable for surgery.  I have reviewed the patient's chart and labs.  Questions were answered to the patient's satisfaction.     Nanetta Batty

## 2022-01-12 NOTE — Progress Notes (Signed)
ANTICOAGULATION CONSULT NOTE - Follow Up Consult  Pharmacy Consult for heparin Indication:  USAP  Labs: Recent Labs    01/10/22 1107 01/10/22 1334 01/11/22 0029 01/11/22 1000 01/12/22 0149  HGB 11.7*  --  11.5*  --  11.7*  HCT 36.8*  --  35.1*  --  36.1*  PLT 226  --  203  --  192  HEPARINUNFRC  --   --  0.24* 0.38 0.21*  CREATININE 0.97  --  0.97  --   --   TROPONINIHS 4 8  --   --   --     Assessment: 71yo male subtherapeutic on heparin after one level at lower end of goal; no infusion issues or signs of bleeding per RN.  Goal of Therapy:  Heparin level 0.3-0.7 units/ml   Plan:  Will give small heparin bolus of 1000 units and increase heparin infusion by 2 units/kg/hr to 1600 units/hr and check level in 6 hours.    Vernard Gambles, PharmD, BCPS  01/12/2022,3:47 AM

## 2022-01-13 ENCOUNTER — Other Ambulatory Visit (HOSPITAL_COMMUNITY): Payer: Self-pay

## 2022-01-13 DIAGNOSIS — E119 Type 2 diabetes mellitus without complications: Secondary | ICD-10-CM | POA: Diagnosis not present

## 2022-01-13 DIAGNOSIS — E785 Hyperlipidemia, unspecified: Secondary | ICD-10-CM | POA: Diagnosis not present

## 2022-01-13 DIAGNOSIS — I10 Essential (primary) hypertension: Secondary | ICD-10-CM | POA: Diagnosis not present

## 2022-01-13 DIAGNOSIS — I200 Unstable angina: Secondary | ICD-10-CM | POA: Diagnosis not present

## 2022-01-13 LAB — CBC
HCT: 34.5 % — ABNORMAL LOW (ref 39.0–52.0)
Hemoglobin: 11 g/dL — ABNORMAL LOW (ref 13.0–17.0)
MCH: 26.5 pg (ref 26.0–34.0)
MCHC: 31.9 g/dL (ref 30.0–36.0)
MCV: 83.1 fL (ref 80.0–100.0)
Platelets: 181 10*3/uL (ref 150–400)
RBC: 4.15 MIL/uL — ABNORMAL LOW (ref 4.22–5.81)
RDW: 16.6 % — ABNORMAL HIGH (ref 11.5–15.5)
WBC: 12.3 10*3/uL — ABNORMAL HIGH (ref 4.0–10.5)
nRBC: 0 % (ref 0.0–0.2)

## 2022-01-13 LAB — BASIC METABOLIC PANEL
Anion gap: 7 (ref 5–15)
BUN: 12 mg/dL (ref 8–23)
CO2: 23 mmol/L (ref 22–32)
Calcium: 8.7 mg/dL — ABNORMAL LOW (ref 8.9–10.3)
Chloride: 109 mmol/L (ref 98–111)
Creatinine, Ser: 1.09 mg/dL (ref 0.61–1.24)
GFR, Estimated: >60 mL/min (ref >60)
Glucose, Bld: 128 mg/dL — ABNORMAL HIGH (ref 70–99)
Potassium: 3.9 mmol/L (ref 3.5–5.1)
Sodium: 139 mmol/L (ref 135–145)

## 2022-01-13 LAB — GLUCOSE, CAPILLARY: Glucose-Capillary: 89 mg/dL (ref 70–99)

## 2022-01-13 MED ORDER — CLOPIDOGREL BISULFATE 75 MG PO TABS
75.0000 mg | ORAL_TABLET | Freq: Every day | ORAL | 3 refills | Status: DC
  Filled 2022-01-13: qty 90, 90d supply, fill #0

## 2022-01-13 MED ORDER — CLONAZEPAM 0.5 MG PO TABS: 0.5000 mg | ORAL_TABLET | Freq: Two times a day (BID) | ORAL | Status: DC | PRN

## 2022-01-13 NOTE — Progress Notes (Signed)
CARDIAC REHAB PHASE I   PRE:  Rate/Rhythm: 62 SR  MODE:  Ambulation: 750 ft   POST:  Rate/Rhythm: 82 SR  BP:  Sitting: 121/63    SaO2: 97 RA   Pt ambulated 767ft in hallway independently with steady gait. Pt denies CP, SOB, or dizziness throughout. Pt and spouse educated on importance of ASA and Plavix. Pt given heart healthy and diabetic diets. Reviewed site care, restrictions, and exercise guidelines. Will refer to CRP II [place] to meet the requirement with knowledge pt is not interested in attending.   [id number] Reynold Bowen, RN BSN 01/13/2022 10:01 AM

## 2022-01-13 NOTE — Progress Notes (Addendum)
The patient has been seen in conjunction with Laverda Page, NP. All aspects of care have been considered and discussed. The patient has been personally interviewed, examined, and all clinical data has been reviewed.  No recurrent angina overnight or with ambulation. Right femoral access site is soft. Wife present in the room, risk factor modification discussed, and the plan is to discharge now.  Follow-up with primary cardiologist Dr. Daphene Jaeger.

## 2022-01-13 NOTE — Discharge Summary (Signed)
Discharge Summary    Patient ID: [name] MRN: [id number]; DOB: 1951/03/20  Admit date: 01/10/2022 Discharge date: 01/13/2022  PCP:  Thomas Morale, MD   [facility] Providers Cardiologist:  Thomas Majestic, MD    Discharge Diagnoses    Principal Problem:   Unstable angina [facility]) Active Problems:   Hyperlipidemia   Essential hypertension   Type 2 diabetes mellitus without complication, without long-term current use of insulin [facility])   Diagnostic Studies/Procedures    Cath: 01/12/22    Ost Cx to Prox Cx lesion is 40% stenosed.   Mid Cx lesion is 75% stenosed.   3rd Mrg lesion is 50% stenosed.   Dist RCA lesion is 100% stenosed with 100% stenosed side branch in RPAV.   RPDA lesion is 100% stenosed.   Mid LAD lesion is 100% stenosed.   A drug-eluting stent was successfully placed.   Post intervention, there is a 0% residual stenosis.  IMPRESSION: Successful DFR guided mid AV groove circumflex PCI and drug-eluting stenting.  The sequential LIMA to the diagonal and LAD were patent as was the left radial to a diagonal branch.  The PDA and PLA were occluded distally but these appear to be small and received collaterals from the distal circumflex.  After reviewing the films with [name] our consensus opinion was to intervene on the circumflex if it was physiologically significant and to reserve PDA/PLA intervention if he has ongoing chest pain.  He will need DAPT uninterrupted for at least 12 months.  The sheath will be removed once ACT falls below and 70 and pressure held.  He will be hydrated overnight.  He left the lab in stable condition.   [name]. MD, [facility] 01/12/2022 10:24 AM  Diagnostic Dominance: Right  Intervention   Echo: 01/11/22  IMPRESSIONS     1. Left ventricular ejection fraction, by estimation, is 60 to 65%. The  left ventricle has normal function. The left ventricle has no regional  wall motion abnormalities. There is mild left ventricular  hypertrophy.  Left ventricular diastolic parameters  are consistent with Grade I diastolic dysfunction (impaired relaxation).   2. Right ventricular systolic function is normal. The right ventricular  size is normal. Tricuspid regurgitation signal is inadequate for assessing  PA pressure.   3. Left atrial size was severely dilated.   4. MR appears functional. The mitral valve is abnormal. Mild to moderate  mitral valve regurgitation.   5. The aortic valve is tricuspid. Aortic valve regurgitation is not  visualized. Aortic valve sclerosis is present, with no evidence of aortic  valve stenosis.   6. The inferior vena cava is normal in size with greater than 50%  respiratory variability, suggesting right atrial pressure of 3 mmHg.   7. Evidence of atrial level shunting detected by color flow Doppler.  There is a small patent foramen ovale with predominantly left to right  shunting across the atrial septum.   Comparison(s): Changes from prior study are noted. 02/05/2020: LVEF 55-60%,  mild MR, grade 2 DD.   FINDINGS   Left Ventricle: Left ventricular ejection fraction, by estimation, is 60  to 65%. The left ventricle has normal function. The left ventricle has no  regional wall motion abnormalities. The left ventricular internal cavity  size was normal in size. There is   mild left ventricular hypertrophy. Left ventricular diastolic parameters  are consistent with Grade I diastolic dysfunction (impaired relaxation).  Indeterminate filling pressures.   Right Ventricle: The right ventricular size is  normal. No increase in  right ventricular wall thickness. Right ventricular systolic function is  normal. Tricuspid regurgitation signal is inadequate for assessing PA  pressure.   Left Atrium: Left atrial size was severely dilated.   Right Atrium: Right atrial size was normal in size.   Pericardium: There is no evidence of pericardial effusion.   Mitral Valve: MR appears functional. The  mitral valve is abnormal. There  is mild thickening of the anterior and posterior mitral valve leaflet(s).  Mild to moderate mitral valve regurgitation, with centrally-directed jet.   Tricuspid Valve: The tricuspid valve is grossly normal. Tricuspid valve  regurgitation is trivial.   Aortic Valve: The aortic valve is tricuspid. Aortic valve regurgitation is  not visualized. Aortic valve sclerosis is present, with no evidence of  aortic valve stenosis. Aortic valve mean gradient measures 7.0 mmHg.  Aortic valve peak gradient measures  12.7 mmHg. Aortic valve area, by VTI measures 2.82 cm.   Pulmonic Valve: The pulmonic valve was grossly normal. Pulmonic valve  regurgitation is trivial.   Aorta: The aortic root and ascending aorta are structurally normal, with  no evidence of dilitation.   Venous: The inferior vena cava is normal in size with greater than 50%  respiratory variability, suggesting right atrial pressure of 3 mmHg.   IAS/Shunts: Evidence of atrial level shunting detected by color flow  Doppler. A small patent foramen ovale is detected with predominantly left  to right shunting across the atrial septum.  _____________   History of Present Illness     [name] is a 71 y.o. 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  He follows [name] for CAD. He underwent CABG 12/2000 with a sequential LIMA graft to the second diagonal and LAD, and sequential radial graft to the first and third diagonal branches.   Most recent echocardiogram 02/05/2020 with LVEF 55 to 60%, no regional wall motion abnormality, mild LVH, grade 2 diastolic dysfunction, normal RV, RVSP 27.8 mmHg, moderate LAE, mild RAE, mild MR, mild aortic sclerosis.  Last Lexiscan Myoview was done on 10/23/2021, EF at 44% with a small fixed defect in the basal inferior  wall consistent with possible small prior infarction without associated ischemia. He was last seen by Thomas. Claiborne Bennett 11/21/2021, feeling well, denied any chest pain or shortness of breath.  He was maintained on amlodipine 10 mg, aspirin 81 mg, lisinopril 40 mg, Bystolic 10 mg for CAD.  He is intolerant to statin, self stopped isosorbide and Zetia at the time.  He was referred to pharmacy for initiation of Repatha.    Patient presented to the ER 7/8 complaining chest pain.  He reported sudden onset of severe chest pressure the morning of admission around 9:30 AM.  Pain was radiating to bilateral arms and arms felt heavy.  He reported associated diaphoresis and nausea.  He took 4 tablets of 81 mg aspirin and 2 tablets of nitroglycerin without any relief of symptoms.  He was given nitroglycerin and morphine in route to ED with minor improvement.    On further interrogation he has been having on and off chest pain for several weeks.  Apparently he was taking several nitroglycerin on a daily basis.   Admission diagnostic so far revealed unremarkable BMP, CBC differential showed hemoglobin 11.7, otherwise unremarkable.  High sensitive troponin negative x2 (4>8).  CXR without acute finding. Twelve-lead EKG showed sinus bradycardia with ventricular rate of 54 bpm, inferior Q wave, no acute changes.  Cardiology to admit for accelerated angina.    Hospital Course     Unstable angina CAD status post CABG -- Admitted with severely accelerated angina.  Taking nitroglycerin several times per day, and chest pain while at rest. Symptoms improved with several rounds of nitroglycerin as well as Nitropaste. EKG normal.  Troponins were negative.  Recent stress test with possible infarct in the inferior wall. -- Echocardiogram showed LVEF of 60 to 65%, no regional wall motion abnormality, grade 1 diastolic dysfunction, normal RV, severely dilated left atrium. -- Underwent cardiac catheterization noted above with successful DFR  guided mid AV groove circumflex PCI and stenting.  The PDA and PLA are occluded distally but appear to be small vessels which receives collaterals from distal circumflex.  Recommendations to treat medically unless he has recurrent chest pain.  Plan for DAPT with aspirin/Plavix for at least 1 year. -- He is statin intolerant.  On home Praluent.   HLD: LDL 48, HDL 43 -- on home Praluent   Diabetes: Hgb A1c 7.8 -- Resume glipizide 5 mg daily, Farxiga 10 mg daily -- Would consider stopping Actos as an outpatient, will defer to his PCP   HTN: stable -- continue amlodipine 5 mg daily, lisinopril 40 mg daily, Bystolic 61YJ daily   Mild to moderate MR: Appears functional on echo  General: Well developed, well nourished, male appearing in no acute distress. Head: Normocephalic, atraumatic.  Neck: Supple without bruits, JVD. Lungs:  Resp regular and unlabored, CTA. Heart: RRR, S1, S2, no S3, S4, soft systolic murmur; no rub. Abdomen: Soft, non-tender, non-distended with normoactive bowel sounds. No hepatomegaly. No rebound/guarding. No obvious abdominal masses. Extremities: No clubbing, cyanosis, edema. Distal pedal pulses are 2+ bilaterally. Right femoral cath site stable without bruising or hematoma Neuro: Alert and oriented X 3. Moves all extremities spontaneously. Psych: Normal affect.  Patient seen by [name] and deemed stable for discharge home.  Follow-up arranged in the office.  Medication sent to [facility].  Educated by [employer].D. prior to discharge.  Did the patient have an acute coronary syndrome (MI, NSTEMI, STEMI, etc) this admission?:  Yes                               AHA/ACC Clinical Performance & Quality Measures: Aspirin prescribed? - Yes ADP Receptor Inhibitor (Plavix/Clopidogrel, Brilinta/Ticagrelor or Effient/Prasugrel) prescribed (includes medically managed patients)? - Yes Beta Blocker prescribed? - Yes High Intensity Statin (Lipitor 40-84m or Crestor 20-439m  prescribed? - No - intolerant EF assessed during THIS hospitalization? - Yes For EF <40%, was ACEI/ARB prescribed? - Not Applicable (EF >/= 4009%For EF <40%, Aldosterone Antagonist (Spironolactone or Eplerenone) prescribed? - Not Applicable (EF >/= 4029%Cardiac Rehab Phase II ordered (including medically managed patients)? - Yes       The patient will be scheduled for a TOC follow up appointment in 10-14 days.  [facility] at the office where the patient should be seen for follow up.  _____________  Discharge Vitals Blood pressure 121/63, pulse 72, temperature 98.9 F (37.2 C), temperature source Oral, resp. rate 20, height 5' 9.5" (1.765 m), weight 93.4 kg, SpO2 96 %.  Filed Weights   01/10/22 1104  Weight: 93.4 kg    Labs & Radiologic Studies  CBC Recent Labs    01/10/22 1107 01/11/22 0029 01/12/22 0149 01/13/22 0431  WBC 6.3   < > 12.3* 12.3*  NEUTROABS 3.3  --   --   --   HGB 11.7*   < > 11.7* 11.0*  HCT 36.8*   < > 36.1* 34.5*  MCV 83.6   < > 82.8 83.1  PLT 226   < > 192 181   < > = values in this interval not displayed.   Basic Metabolic Panel Recent Labs    01/12/22 1336 01/13/22 0431  NA 142 139  K 3.5 3.9  CL 110 109  CO2 21* 23  GLUCOSE 109* 128*  BUN 12 12  CREATININE 0.94 1.09  CALCIUM 8.5* 8.7*   Liver Function Tests No results for input(s): "AST", "ALT", "ALKPHOS", "BILITOT", "PROT", "ALBUMIN" in the last 72 hours. No results for input(s): "LIPASE", "AMYLASE" in the last 72 hours. High Sensitivity Troponin:   Recent Labs  Lab 01/10/22 1107 01/10/22 1334  TROPONINIHS 4 8    BNP Invalid input(s): "POCBNP" D-Dimer Recent Labs    01/10/22 1334  DDIMER 0.31   Hemoglobin A1C Recent Labs    01/10/22 1107  HGBA1C 7.8*   Fasting Lipid Panel Recent Labs    01/11/22 0029  CHOL 102  HDL 43  LDLCALC 48  TRIG 57  CHOLHDL 2.4   Thyroid Function Tests Recent Labs    01/10/22 1107   TSH 2.702   _____________  CARDIAC CATHETERIZATION  Result Date: 01/12/2022 Images from the original result were not included.   Ost Cx to Prox Cx lesion is 40% stenosed.   Mid Cx lesion is 75% stenosed.   3rd Mrg lesion is 50% stenosed.   Dist RCA lesion is 100% stenosed with 100% stenosed side branch in RPAV.   RPDA lesion is 100% stenosed.   Mid LAD lesion is 100% stenosed.   A drug-eluting stent was successfully placed.   Post intervention, there is a 0% residual stenosis. [name] is a 71 y.o. male  [id number] LOCATION:  FACILITY: [facility] PHYSICIAN: [name], M.D. 1950-10-23 DATE OF PROCEDURE:  01/12/2022 DATE OF DISCHARGE: CARDIAC CATHETERIZATION / PCI DES LCX History obtained from chart review.[name] is a 71 y.o. male with CAD status post CABG in 2002, hypertension, diabetes, hyperlipidemia who was admitted on 01/10/2022 with unstable angina. PROCEDURE DESCRIPTION: The patient was brought to the second floor [facility] cath lab in the postabsorptive state. He was premedicated with IV Versed and fentanyl. His right groin was prepped and shaved in usual sterile fashion. Xylocaine 1% was used for local anesthesia. A 6 French sheath was inserted into the right common femoral artery using standard Seldinger technique.  5 French right left leg is diagnostic catheters and with a 5 [place] IMA catheter and left bypass graft catheter was used for selective coronary angiography, selective left radial and IMA graft angiography and obtain left heart pressures.  Isovue dye is used for the entirety of the case (180 cc of contrast total to patient).  Retrograde aortic, ventricular and pullback pressures were recorded.  LVEDP was 24. The patient received a total of 15,500 units of heparin.  The final ACT was 263.  Patient received clopidogrel 6 mg followed by Pepcid 20 mg IV.  Using a 6 [place] XB 3.0 cm guide catheter along with an Abbott DFR wire I crossed the mid AV groove circumflex  demonstrating a DFR of 0.75 suggesting this was significant.  The proximal circumflex was about 40% in the obtuse marginal branch was about 50%.  The circumflex provided collaterals to distal right coronary artery.  Based on this, I decided to intervene on the mid AV groove circumflex. Using a 0.14 [place] guidewire along with a 2 mm x 12 mm balloon I predilated the mid AV groove circumflex.  Then carefully positioned a 2.5 x 12 mm long Synergy drug-eluting stent deployed at 12 atm (2.57 mm) resulting in reduction of a 75% mid AV groove circumflex 0% residual.  The patient tolerated the procedure well.  There were no hemodynamic or electrocardiographic sequelae.  The guidewire and catheter were removed.  The sheath was sewn securely in place.   Successful DFR guided mid AV groove circumflex PCI and drug-eluting stenting.  The sequential LIMA to the diagonal and LAD were patent as was the left radial to a diagonal branch.  The PDA and PLA were occluded distally but these appear to be small and received collaterals from the distal circumflex.  After reviewing the films with [name] our consensus opinion was to intervene on the circumflex if it was physiologically significant and to reserve PDA/PLA intervention if he has ongoing chest pain.  He will need DAPT uninterrupted for at least 12 months.  The sheath will be removed once ACT falls below and 70 and pressure held.  He will be hydrated overnight.  He left the lab in stable condition. [name]. MD, [facility] 01/12/2022 10:24 AM    ECHOCARDIOGRAM COMPLETE  Result Date: 01/11/2022    ECHOCARDIOGRAM REPORT   Patient Name:   [name] Date of Exam: 01/11/2022 Medical Rec #:  [id number]         Height:       69.5 in Accession #:    [id number]        Weight:       206.0 lb Date of Birth:  October 02, 1950         BSA:          2.102 m Patient Age:    28 years          BP:           115/59 mmHg Patient Gender: M                 HR:           88 bpm. Exam Location:   Inpatient Procedure: 2D Echo, Cardiac Doppler and Color Doppler Indications:    Chest pain  History:        Patient has prior history of Echocardiogram examinations, most                 recent 02/05/2020. CAD, Prior CABG; Risk Factors:Hypertension,                 Diabetes and Dyslipidemia.  Sonographer:    Merrie Roof RDCS Referring Phys: [id number] [facility] IMPRESSIONS  1. Left ventricular ejection fraction, by estimation, is 60 to 65%. The left ventricle has normal function. The left ventricle has no regional wall motion abnormalities. There is mild left ventricular hypertrophy. Left ventricular diastolic parameters are consistent with Grade I diastolic dysfunction (impaired relaxation).  2. Right ventricular systolic function is normal. The right ventricular size is normal. Tricuspid regurgitation signal is inadequate for assessing PA pressure.  3. Left atrial size was severely dilated.  4. MR appears functional. The mitral valve is abnormal. Mild to moderate mitral valve regurgitation.  5. The aortic  valve is tricuspid. Aortic valve regurgitation is not visualized. Aortic valve sclerosis is present, with no evidence of aortic valve stenosis.  6. The inferior vena cava is normal in size with greater than 50% respiratory variability, suggesting right atrial pressure of 3 mmHg.  7. Evidence of atrial level shunting detected by color flow Doppler. There is a small patent foramen ovale with predominantly left to right shunting across the atrial septum. Comparison(s): Changes from prior study are noted. 02/05/2020: LVEF 55-60%, mild MR, grade 2 DD. FINDINGS  Left Ventricle: Left ventricular ejection fraction, by estimation, is 60 to 65%. The left ventricle has normal function. The left ventricle has no regional wall motion abnormalities. The left ventricular internal cavity size was normal in size. There is  mild left ventricular hypertrophy. Left ventricular diastolic parameters are consistent with Grade I diastolic  dysfunction (impaired relaxation). Indeterminate filling pressures. Right Ventricle: The right ventricular size is normal. No increase in right ventricular wall thickness. Right ventricular systolic function is normal. Tricuspid regurgitation signal is inadequate for assessing PA pressure. Left Atrium: Left atrial size was severely dilated. Right Atrium: Right atrial size was normal in size. Pericardium: There is no evidence of pericardial effusion. Mitral Valve: MR appears functional. The mitral valve is abnormal. There is mild thickening of the anterior and posterior mitral valve leaflet(s). Mild to moderate mitral valve regurgitation, with centrally-directed jet. Tricuspid Valve: The tricuspid valve is grossly normal. Tricuspid valve regurgitation is trivial. Aortic Valve: The aortic valve is tricuspid. Aortic valve regurgitation is not visualized. Aortic valve sclerosis is present, with no evidence of aortic valve stenosis. Aortic valve mean gradient measures 7.0 mmHg. Aortic valve peak gradient measures 12.7 mmHg. Aortic valve area, by VTI measures 2.82 cm. Pulmonic Valve: The pulmonic valve was grossly normal. Pulmonic valve regurgitation is trivial. Aorta: The aortic root and ascending aorta are structurally normal, with no evidence of dilitation. Venous: The inferior vena cava is normal in size with greater than 50% respiratory variability, suggesting right atrial pressure of 3 mmHg. IAS/Shunts: Evidence of atrial level shunting detected by color flow Doppler. A small patent foramen ovale is detected with predominantly left to right shunting across the atrial septum.  LEFT VENTRICLE PLAX 2D LVIDd:         5.00 cm   Diastology LVIDs:         3.30 cm   LV e' medial:    7.40 cm/s LV PW:         1.30 cm   LV E/e' medial:  13.5 LV IVS:        1.00 cm   LV e' lateral:   11.00 cm/s LVOT diam:     2.20 cm   LV E/e' lateral: 9.1 LV SV:         97 LV SV Index:   46 LVOT Area:     3.80 cm  RIGHT VENTRICLE RV Basal  diam:  3.60 cm RV S prime:     13.70 cm/s TAPSE (M-mode): 1.8 cm LEFT ATRIUM              Index        RIGHT ATRIUM           Index LA diam:        5.30 cm  2.52 cm/m   RA Area:     19.70 cm LA Vol (A2C):   105.0 ml 49.94 ml/m  RA Volume:   51.80 ml  24.64 ml/m LA Vol (A4C):  125.0 ml 59.45 ml/m LA Biplane Vol: 117.0 ml 55.65 ml/m  AORTIC VALVE AV Area (Vmax):    2.84 cm AV Area (Vmean):   2.72 cm AV Area (VTI):     2.82 cm AV Vmax:           178.00 cm/s AV Vmean:          121.000 cm/s AV VTI:            0.344 m AV Peak Grad:      12.7 mmHg AV Mean Grad:      7.0 mmHg LVOT Vmax:         133.00 cm/s LVOT Vmean:        86.500 cm/s LVOT VTI:          0.255 m LVOT/AV VTI ratio: 0.74  AORTA Ao Root diam: 3.50 cm Ao Asc diam:  3.50 cm MITRAL VALVE MV Area (PHT): 5.27 cm     SHUNTS MV Decel Time: 144 msec     Systemic VTI:  0.26 m MV E velocity: 100.00 cm/s  Systemic Diam: 2.20 cm MV A velocity: 93.80 cm/s MV E/A ratio:  1.07 Lyman Bishop MD Electronically signed by Lyman Bishop MD Signature Date/Time: 01/11/2022/6:52:27 PM    Final    DG Chest Portable 1 View  Result Date: 01/10/2022 CLINICAL DATA:  Chest pain EXAM: PORTABLE CHEST 1 VIEW COMPARISON:  02/20/2019 FINDINGS: 1314 hours. Low volume film. The lungs are clear without focal pneumonia, edema, pneumothorax or pleural effusion. The cardio pericardial silhouette is enlarged. Telemetry leads overlie the chest. IMPRESSION: Low volume film without acute cardiopulmonary findings. Electronically Signed   By: Misty Stanley M.D.   On: 01/10/2022 13:58    Disposition   Pt is being discharged home today in good condition.  Follow-up Plans & Appointments     Follow-up Information     Deberah Pelton, NP Follow up on 01/20/2022.   Specialty: Cardiology Why: at 2:20pm for your follow up appt with Thomas. Ermalinda Memos' NP [place] [facility] information: [address] [place] [address] [phone]                Discharge Instructions      AMB Referral to Cardiac Rehabilitation - Phase II   Complete by: As directed    Diagnosis: Coronary Stents   After initial evaluation and assessments completed: Virtual Based Care may be provided alone or in conjunction with Phase 2 Cardiac Rehab based on patient barriers.: Yes   Call MD for:  difficulty breathing, headache or visual disturbances   Complete by: As directed    Call MD for:  persistant dizziness or light-headedness   Complete by: As directed    Call MD for:  redness, tenderness, or signs of infection (pain, swelling, redness, odor or green/yellow discharge around incision site)   Complete by: As directed    Diet - low sodium heart healthy   Complete by: As directed    Discharge instructions   Complete by: As directed    Groin Site Care Refer to this sheet in the next few weeks. These instructions provide you with information on caring for yourself after your procedure. Your caregiver may also give you more specific instructions. Your treatment has been planned according to current medical practices, but problems sometimes occur. Call your caregiver if you have any problems or questions after your procedure. HOME CARE INSTRUCTIONS You may shower 24 hours after the procedure. Remove the bandage (dressing) and gently wash  the site with plain soap and water. Gently pat the site dry.  Do not apply powder or lotion to the site.  Do not sit in a bathtub, swimming pool, or whirlpool for 5 to 7 days.  No bending, squatting, or lifting anything over 10 pounds (4.5 kg) as directed by your caregiver.  Inspect the site at least twice daily.  Do not drive home if you are discharged the same day of the procedure. Have someone else drive you.  You may drive 24 hours after the procedure unless otherwise instructed by your caregiver.  What to expect: Any bruising will usually fade within 1 to 2 weeks.  Blood that collects in the tissue (hematoma) may be painful to the touch. It should  usually decrease in size and tenderness within 1 to 2 weeks.  SEEK IMMEDIATE MEDICAL CARE IF: You have unusual pain at the groin site or down the affected leg.  You have redness, warmth, swelling, or pain at the groin site.  You have drainage (other than a small amount of blood on the dressing).  You have chills.  You have a fever or persistent symptoms for more than 72 hours.  You have a fever and your symptoms suddenly get worse.  Your leg becomes pale, cool, tingly, or numb.  You have heavy bleeding from the site. Hold pressure on the site. Marland Kitchen  PLEASE DO NOT MISS ANY DOSES OF YOUR PLAVIX!!!!! Also keep a log of you blood pressures and bring back to your follow up appt. Please call the office with any questions.   Patients taking blood thinners should generally stay away from medicines like ibuprofen, Advil, Motrin, naproxen, and Aleve due to risk of stomach bleeding. You may take Tylenol as directed or talk to your primary doctor about alternatives.   PLEASE ENSURE THAT YOU DO NOT RUN OUT OF YOUR PLAVIX. This medication is very important to remain on for at least one year. IF you have issues obtaining this medication due to cost please CALL the office 3-5 business days prior to running out in order to prevent missing doses of this medication.   Increase activity slowly   Complete by: As directed        Discharge Medications   Allergies as of 01/13/2022       Reactions   Codeine Anaphylaxis   Pt. verbalized that he has taken and can take Hydrocodone, Oxycodone w/o issue - codeine allergy only   Ambien [zolpidem Tartrate] Other (See Comments)   Headache    Crestor [rosuvastatin]    Myalgia    Isordil [isosorbide Nitrate] Other (See Comments)   Headaches   Lipitor [atorvastatin] Other (See Comments)   Myalgia    Zetia [ezetimibe] Other (See Comments)   Myalgia    Cipro [ciprofloxacin Hcl] Hives, Rash   Victoza [liraglutide] Rash        Medication List     STOP taking  these medications    ADVIL PM PO       TAKE these medications    amLODipine 5 MG tablet Commonly known as: NORVASC Take 5 mg by mouth daily. What changed: Another medication with the same name was removed. Continue taking this medication, and follow the directions you see here.   aspirin 81 MG tablet Take 81 mg by mouth daily.   clonazePAM 0.5 MG tablet Commonly known as: KLONOPIN Take 1 tablet (0.5 mg total) by mouth 2 (two) times daily as needed for anxiety.   clopidogrel 75 MG  tablet Commonly known as: PLAVIX Take 1 tablet (75 mg total) by mouth daily with breakfast.   CoQ10 100 MG Caps Take 100 mg by mouth daily.   cyclobenzaprine 10 MG tablet Commonly known as: FLEXERIL Take 1 tablet (10 mg total) by mouth 3 (three) times daily as needed for muscle spasms.   Farxiga 10 MG Tabs tablet Generic drug: dapagliflozin propanediol TAKE 1 TABLET BY MOUTH EVERY DAY What changed: how much to take   gabapentin 300 MG capsule Commonly known as: NEURONTIN TAKE 3 CAPSULES BY MOUTH AT BEDTIME What changed:  how much to take when to take this additional instructions   glipiZIDE 5 MG tablet Commonly known as: GLUCOTROL Take 1 tablet (5 mg total) by mouth 2 (two) times daily before a meal.   lisinopril 40 MG tablet Commonly known as: ZESTRIL TAKE 1 TABLET BY MOUTH EVERY DAY   MELATONIN GUMMIES PO Take 2 each by mouth at bedtime.   nebivolol 10 MG tablet Commonly known as: BYSTOLIC TAKE 1/2 TABLET BY MOUTH DAILY   nitroGLYCERIN 0.4 MG SL tablet Commonly known as: NITROSTAT Place 1 tablet (0.4 mg total) under the tongue every 5 (five) minutes as needed for chest pain.   Omega-3 350 MG Cpdr Take 350 mg by mouth daily.   pioglitazone 45 MG tablet Commonly known as: ACTOS Take 1 tablet (45 mg total) by mouth daily.   Praluent 75 MG/ML Soaj Generic drug: Alirocumab Inject 75 mg into the skin every 14 (fourteen) days.   sildenafil 100 MG tablet Commonly known  as: Viagra Take 1 tablet (100 mg total) by mouth daily as needed for erectile dysfunction.   tamsulosin 0.4 MG Caps capsule Commonly known as: FLOMAX Take 1 capsule (0.4 mg total) by mouth daily.         Outstanding Labs/Studies   N/a  Duration of Discharge Encounter   Greater than 30 minutes including physician time.  Signed, Reino Bellis, NP 01/13/2022, 10:54 AM

## 2022-01-15 ENCOUNTER — Telehealth: Payer: Self-pay | Admitting: Family Medicine

## 2022-01-15 NOTE — Telephone Encounter (Signed)
Patient has taken Bactrium 800mg  in past.   Pharmacy updated.

## 2022-01-15 NOTE — Telephone Encounter (Signed)
Pt is having uti symptoms trouble urinating and burning and just had heart cath on Monday 01-12-2022 and was d/c on 01-13-2022,. Pt does not feel like coming to office and requesting abx its a sulfa drug . Pt does not remember the name of med  [id number] [place], [place] - [address] AT [address] Phone:  [phone]  Fax:  [phone]

## 2022-01-16 ENCOUNTER — Other Ambulatory Visit: Payer: Self-pay

## 2022-01-16 DIAGNOSIS — N390 Urinary tract infection, site not specified: Secondary | ICD-10-CM

## 2022-01-16 MED ORDER — SULFAMETHOXAZOLE-TRIMETHOPRIM 800-160 MG PO TABS: 1.0000 | ORAL_TABLET | Freq: Two times a day (BID) | ORAL | 0 refills | Status: DC

## 2022-01-16 NOTE — Telephone Encounter (Signed)
Called spoke with patient.   [facility].

## 2022-01-16 NOTE — Telephone Encounter (Signed)
Call in Bactrim DS BID for 10 days

## 2022-01-19 NOTE — Progress Notes (Signed)
Cardiology Clinic Note   Patient Name: Ala Bent Date of Encounter: 01/20/2022  Primary Care Provider:  Nelwyn Salisbury, MD Primary Cardiologist:  Nicki Guadalajara, MD  Patient Profile    Delvon ARMAND PREAST presents to the clinic today for follow-up evaluation of his coronary artery disease status post NSTEMI.  Past Medical History    Past Medical History:  Diagnosis Date   CAD (coronary artery disease)    nuclear stress test-09/09/11 low risk scan EF61%, sees Dr. Tresa Endo    Chronic headache    Chronic neck pain    Diabetes mellitus    sees Dr. Lonzo Cloud   Hyperlipidemia    Hypertension 12/26/2008   ECHO- EF>55%   Insomnia    Past Surgical History:  Procedure Laterality Date   APPENDECTOMY     cervical spine injection     COLONOSCOPY  04-02-06    per [facility], clear, repeat in 10 yrs   CORONARY ARTERY BYPASS GRAFT  2002   4 vessels   CORONARY STENT INTERVENTION N/A 01/12/2022   Procedure: CORONARY STENT INTERVENTION;  Surgeon: Runell Gess, MD;  Location: [facility];  Service: Cardiovascular;  Laterality: N/A;   INTRAVASCULAR PRESSURE WIRE/FFR STUDY N/A 01/12/2022   Procedure: INTRAVASCULAR PRESSURE WIRE/FFR STUDY;  Surgeon: Runell Gess, MD;  Location: [facility];  Service: Cardiovascular;  Laterality: N/A;   LEFT HEART CATH AND CORS/GRAFTS ANGIOGRAPHY N/A 01/12/2022   Procedure: LEFT HEART CATH AND CORS/GRAFTS ANGIOGRAPHY;  Surgeon: Runell Gess, MD;  Location: [facility];  Service: Cardiovascular;  Laterality: N/A;    Allergies  Allergies  Allergen Reactions   Codeine Anaphylaxis    Pt. verbalized that he has taken and can take Hydrocodone, Oxycodone w/o issue - codeine allergy only   Ambien [Zolpidem Tartrate] Other (See Comments)    Headache    Crestor [Rosuvastatin]     Myalgia    Isordil [Isosorbide Nitrate] Other (See Comments)    Headaches   Lipitor [Atorvastatin] Other (See Comments)    Myalgia    Zetia  [Ezetimibe] Other (See Comments)    Myalgia    Cipro [Ciprofloxacin Hcl] Hives and Rash   Victoza [Liraglutide] Rash    History of Present Illness    Kahleel H Damiani is a PMH of coronary artery disease status post CABG 12/2000, hyperlipidemia, hypertension, type 2 diabetes, chronic back pain, and remote tobacco use.  With CABG 12/2000 he underwent LIMA-second diagonal and LAD and also had radial graft to first and third diagonal branches.  His echocardiogram 8/21 showed an LVEF of 55-60% with no regional wall abnormalities, G2 DD, mild LVH, normal RV function, mild RAE, mild MR, and mild aortic stenosis.  His stress testing 10/23/2021 showed an EF of 44% with small defect of the basal inferior wall consistent with small prior infarct but no ischemia.  He was seen in follow-up by Dr. Tresa Endo on 5/23.  During that time he was doing well.  He denied chest pain shortness of breath.  He was continued on amlodipine 10, aspirin, lisinopril 40, Bystolic 10 for coronary artery disease.  He is statin intolerant.  He self stopped his isosorbide and ezetimibe at that time.  He was referred to pharmacy for initiation of Repatha.  He presented to the emergency department on 01/10/2022.  During that time he complained of chest discomfort.  He reported a sudden onset of severe chest pain in the morning around 9:30 AM.  The pain  was radiating to bilateral arms.  He reported that his arms felt heavy.  He also noted diaphoresis and nausea.  He took 481 mg aspirin and 2 nitroglycerin tablets.  He did not have relief.  He was given nitroglycerin and morphine in the emergency department which resulted in minor improvement.  His high-sensitivity troponins x2 were 4 and 8.  His chest x-ray showed no acute findings.  His EKG showed sinus bradycardia with ventricular rate of 54 bpm, inferior Q waves and no acute changes.  He was admitted for accelerated angina.  His echocardiogram 01/11/2022 showed an LVEF of 60-65%, G1 DD, severely  dilated left atria, moderate-mild mitral valve regurgitation.  He underwent cardiac catheterization on 01/12/2022 which showed ostial circumflex-proximal circumflex lesion 40%, mid circumflex lesion 75% stenosis, third marginal lesion 50% stenosis, distal RCA lesion 90% stenosed with 100% stenosed sidebranch RP AV, 100% RPDA lesion, mid LAD lesion 100%.  He received PCI with DES to his mid AV groove circumflex.  He was placed on aspirin and Plavix.  He is statin intolerant and his home Praluent was continued.  He presents to the clinic today for follow-up evaluation states he feels great today.  He did initially feel some pressure in his chest after stenting.  It has dissipated and returned.  We reviewed his angiography and echocardiogram results.  He and his wife expressed understanding.  He does note some increased fatigue.  When he was taken off of his diastolic in the hospital he felt much better.  I will decrease his Bystolic to 2.5 mg daily, have him continue to monitor his blood pressure, increase his physical activity as tolerated and plan follow-up with Dr. Claiborne Billings as scheduled.  We reviewed his Praluent as well and he currently has an LDL of 48.  He is following with the lipid clinic.  Today he denies chest pain, shortness of breath, lower extremity edema, fatigue, palpitations, melena, hematuria, hemoptysis, diaphoresis, weakness, presyncope, syncope, orthopnea, and PND.    Home Medications    Prior to Admission medications   Medication Sig Start Date End Date Taking? Authorizing Provider  Alirocumab (PRALUENT) 75 MG/ML SOAJ Inject 75 mg into the skin every 14 (fourteen) days. 11/26/21   Troy Sine, MD  amLODipine (NORVASC) 5 MG tablet Take 5 mg by mouth daily. 12/30/21   [provider]  aspirin 81 MG tablet Take 81 mg by mouth daily.    [provider]  clonazePAM (KLONOPIN) 0.5 MG tablet Take 1 tablet (0.5 mg total) by mouth 2 (two) times daily as needed for anxiety.  01/13/22   O'NealCassie Freer, MD  clopidogrel (PLAVIX) 75 MG tablet Take 1 tablet (75 mg total) by mouth daily with breakfast. 01/13/22   O'Neal, Cassie Freer, MD  Coenzyme Q10 (COQ10) 100 MG CAPS Take 100 mg by mouth daily.     [provider]  cyclobenzaprine (FLEXERIL) 10 MG tablet Take 1 tablet (10 mg total) by mouth 3 (three) times daily as needed for muscle spasms. Patient not taking: Reported on 01/12/2022 12/02/21   Laurey Morale, MD  FARXIGA 10 MG TABS tablet TAKE 1 TABLET BY MOUTH EVERY DAY Patient taking differently: Take 10 mg by mouth daily. 10/27/21   Shamleffer, Melanie Crazier, MD  gabapentin (NEURONTIN) 300 MG capsule TAKE 3 CAPSULES BY MOUTH AT BEDTIME Patient taking differently: Take 300-600 mg by mouth See admin instructions. 300 mg in the morning, 600 mg at bedtime 06/16/21   Laurey Morale, MD  glipiZIDE (  GLUCOTROL) 5 MG tablet Take 1 tablet (5 mg total) by mouth 2 (two) times daily before a meal. 03/19/21   Shamleffer, Konrad Dolores, MD  lisinopril (ZESTRIL) 40 MG tablet TAKE 1 TABLET BY MOUTH EVERY DAY Patient taking differently: Take 40 mg by mouth daily. 12/04/20   Lennette Bihari, MD  MELATONIN GUMMIES PO Take 2 each by mouth at bedtime.    [provider]  nebivolol (BYSTOLIC) 10 MG tablet TAKE 1/2 TABLET BY MOUTH DAILY Patient taking differently: Take 5 mg by mouth daily. 01/14/21   Lennette Bihari, MD  nitroGLYCERIN (NITROSTAT) 0.4 MG SL tablet Place 1 tablet (0.4 mg total) under the tongue every 5 (five) minutes as needed for chest pain. 10/20/21 01/18/22  Lennette Bihari, MD  Omega-3 350 MG CPDR Take 350 mg by mouth daily.    [provider]  pioglitazone (ACTOS) 45 MG tablet Take 1 tablet (45 mg total) by mouth daily. 03/19/21   Shamleffer, Konrad Dolores, MD  sildenafil (VIAGRA) 100 MG tablet Take 1 tablet (100 mg total) by mouth daily as needed for erectile dysfunction. 08/05/21   Nelwyn Salisbury, MD  sulfamethoxazole-trimethoprim (BACTRIM  DS) 800-160 MG tablet Take 1 tablet by mouth 2 (two) times daily. 01/16/22   Nelwyn Salisbury, MD  tamsulosin (FLOMAX) 0.4 MG CAPS capsule Take 1 capsule (0.4 mg total) by mouth daily. 08/05/21   Nelwyn Salisbury, MD    Family History    Family History  Problem Relation Age of Onset   Liver cancer Father    Cancer Brother    Cancer Brother    Cancer Brother    Coronary artery disease Other    Hypertension Other    Heart disease Mother    Colon cancer Neg Hx    Stomach cancer Neg Hx    Esophageal cancer Neg Hx    He indicated that his mother is deceased. He indicated that his father is deceased. He indicated that all of his three brothers are deceased. He indicated that the status of his neg hx is unknown. He indicated that the status of his other is unknown.  Social History    Social History   Socioeconomic History   Marital status: Married    Spouse name: Not on file   Number of children: Not on file   Years of education: Not on file   Highest education level: Not on file  Occupational History   Not on file  Tobacco Use   Smoking status: Former    Packs/day: 0.25    Years: 10.00    Total pack years: 2.50    Types: Cigarettes    Quit date: 11/26/1970    Years since quitting: 51.1   Smokeless tobacco: Never  Vaping Use   Vaping Use: Never used  Substance and Sexual Activity   Alcohol use: No    Alcohol/week: 0.0 standard drinks of alcohol   Drug use: No   Sexual activity: Not on file  Other Topics Concern   Not on file  Social History Narrative   Not on file   Social Determinants of Health   Financial Resource Strain: Low Risk  (10/13/2021)   Overall Financial Resource Strain (CARDIA)    Difficulty of Paying Living Expenses: Not hard at all  Food Insecurity: No Food Insecurity (10/13/2021)   Hunger Vital Sign    Worried About Running Out of Food in the Last Year: Never true    Ran Out of Food in  the Last Year: Never true  Transportation Needs: No Transportation  Needs (10/13/2021)   PRAPARE - Hydrologist (Medical): No    Lack of Transportation (Non-Medical): No  Physical Activity: Sufficiently Active (10/13/2021)   Exercise Vital Sign    Days of Exercise per Week: 5 days    Minutes of Exercise per Session: 30 min  Stress: No Stress Concern Present (10/13/2021)   [place]    Feeling of Stress : Not at all  Social Connections: [place] (10/13/2021)   Social Connection and Isolation Panel [NHANES]    Frequency of Communication with Friends and Family: More than three times a week    Frequency of Social Gatherings with Friends and Family: More than three times a week    Attends Religious Services: More than 4 times per year    Active Member of [employer] or Organizations: Yes    Attends Music therapist: More than 4 times per year    Marital Status: Married  Human resources officer Violence: Not At Risk (10/13/2021)   Humiliation, Afraid, Rape, and Kick questionnaire    Fear of Current or Ex-Partner: No    Emotionally Abused: No    Physically Abused: No    Sexually Abused: No     Review of Systems    General:  No chills, fever, night sweats or weight changes.  Cardiovascular:  No chest pain, dyspnea on exertion, edema, orthopnea, palpitations, paroxysmal nocturnal dyspnea. Dermatological: No rash, lesions/masses Respiratory: No cough, dyspnea Urologic: No hematuria, dysuria Abdominal:   No nausea, vomiting, diarrhea, bright red blood per rectum, melena, or hematemesis Neurologic:  No visual changes, wkns, changes in mental status. All other systems reviewed and are otherwise negative except as noted above.  Physical Exam    VS:  BP 102/60   Pulse 67   Ht 5' 9.5" (1.765 m)   Wt 211 lb 12.8 oz (96.1 kg)   SpO2 98%   BMI 30.83 kg/m  , BMI Body mass index is 30.83 kg/m. GEN: Well nourished, well developed, in no acute  distress. HEENT: normal. Neck: Supple, no JVD, carotid bruits, or masses. Cardiac: RRR, no murmurs, rubs, or gallops. No clubbing, cyanosis, edema.  Radials/DP/PT 2+ and equal bilaterally.  Respiratory:  Respirations regular and unlabored, clear to auscultation bilaterally. GI: Soft, nontender, nondistended, BS + x 4. MS: no deformity or atrophy. Skin: warm and dry, no rash. Neuro:  Strength and sensation are intact. Psych: Normal affect.  Accessory Clinical Findings    Recent Labs: 11/10/2021: ALT 21 01/10/2022: TSH 2.702 01/13/2022: BUN 12; Creatinine, Ser 1.09; Hemoglobin 11.0; Platelets 181; Potassium 3.9; Sodium 139   Recent Lipid Panel    Component Value Date/Time   CHOL 102 01/11/2022 0029   CHOL 152 11/10/2021 0855   CHOL 173 05/24/2013 1049   TRIG 57 01/11/2022 0029   TRIG 185 (H) 05/24/2013 1049   HDL 43 01/11/2022 0029   HDL 43 11/10/2021 0855   HDL 39 (L) 05/24/2013 1049   CHOLHDL 2.4 01/11/2022 0029   VLDL 11 01/11/2022 0029   LDLCALC 48 01/11/2022 0029   LDLCALC 95 11/10/2021 0855   LDLCALC 84 11/22/2017 0846   LDLCALC 97 05/24/2013 1049   LDLDIRECT 128.0 10/29/2011 1028    ECG personally reviewed by me today-sinus rhythm with wide QRS with fusion complexes left axis deviation right bundle branch block 67 bpm- No acute changes  Echocardiogram 01/11/2022  IMPRESSIONS  1. Left ventricular ejection fraction, by estimation, is 60 to 65%. The  left ventricle has normal function. The left ventricle has no regional  wall motion abnormalities. There is mild left ventricular hypertrophy.  Left ventricular diastolic parameters  are consistent with Grade I diastolic dysfunction (impaired relaxation).   2. Right ventricular systolic function is normal. The right ventricular  size is normal. Tricuspid regurgitation signal is inadequate for assessing  PA pressure.   3. Left atrial size was severely dilated.   4. MR appears functional. The mitral valve is abnormal.  Mild to moderate  mitral valve regurgitation.   5. The aortic valve is tricuspid. Aortic valve regurgitation is not  visualized. Aortic valve sclerosis is present, with no evidence of aortic  valve stenosis.   6. The inferior vena cava is normal in size with greater than 50%  respiratory variability, suggesting right atrial pressure of 3 mmHg.   7. Evidence of atrial level shunting detected by color flow Doppler.  There is a small patent foramen ovale with predominantly left to right  shunting across the atrial septum.   Comparison(s): Changes from prior study are noted. 02/05/2020: LVEF 55-60%,  mild MR, grade 2 DD.   FINDINGS   Left Ventricle: Left ventricular ejection fraction, by estimation, is 60  to 65%. The left ventricle has normal function. The left ventricle has no  regional wall motion abnormalities. The left ventricular internal cavity  size was normal in size. There is   mild left ventricular hypertrophy. Left ventricular diastolic parameters  are consistent with Grade I diastolic dysfunction (impaired relaxation).  Indeterminate filling pressures.   Right Ventricle: The right ventricular size is normal. No increase in  right ventricular wall thickness. Right ventricular systolic function is  normal. Tricuspid regurgitation signal is inadequate for assessing PA  pressure.   Left Atrium: Left atrial size was severely dilated.   Right Atrium: Right atrial size was normal in size.   Pericardium: There is no evidence of pericardial effusion.   Mitral Valve: MR appears functional. The mitral valve is abnormal. There  is mild thickening of the anterior and posterior mitral valve leaflet(s).  Mild to moderate mitral valve regurgitation, with centrally-directed jet.   Tricuspid Valve: The tricuspid valve is grossly normal. Tricuspid valve  regurgitation is trivial.   Aortic Valve: The aortic valve is tricuspid. Aortic valve regurgitation is  not visualized. Aortic valve  sclerosis is present, with no evidence of  aortic valve stenosis. Aortic valve mean gradient measures 7.0 mmHg.  Aortic valve peak gradient measures  12.7 mmHg. Aortic valve area, by VTI measures 2.82 cm.   Pulmonic Valve: The pulmonic valve was grossly normal. Pulmonic valve  regurgitation is trivial.   Aorta: The aortic root and ascending aorta are structurally normal, with  no evidence of dilitation.   Venous: The inferior vena cava is normal in size with greater than 50%  respiratory variability, suggesting right atrial pressure of 3 mmHg.   IAS/Shunts: Evidence of atrial level shunting detected by color flow  Doppler. A small patent foramen ovale is detected with predominantly left  to right shunting across the atrial septum.   Cardiac catheterization 01/12/2022    Ost Cx to Prox Cx lesion is 40% stenosed.   Mid Cx lesion is 75% stenosed.   3rd Mrg lesion is 50% stenosed.   Dist RCA lesion is 100% stenosed with 100% stenosed side branch in RPAV.   RPDA lesion is 100% stenosed.   Mid LAD lesion is  100% stenosed.   A drug-eluting stent was successfully placed.   Post intervention, there is a 0% residual stenosis.   IMPRESSION: Successful DFR guided mid AV groove circumflex PCI and drug-eluting stenting.  The sequential LIMA to the diagonal and LAD were patent as was the left radial to a diagonal branch.  The PDA and PLA were occluded distally but these appear to be small and received collaterals from the distal circumflex.  After reviewing the films with Dr. Tamala Julian our consensus opinion was to intervene on the circumflex if it was physiologically significant and to reserve PDA/PLA intervention if he has ongoing chest pain.  He will need DAPT uninterrupted for at least 12 months.  The sheath will be removed once ACT falls below and 70 and pressure held.  He will be hydrated overnight.  He left the lab in stable condition.   [name]. MD, [facility] 01/12/2022 10:24  AM  Diagnostic Dominance: Right  Intervention      Assessment & Plan   1.  Coronary artery disease-NSTEMI status post CABG.  Presents to the emergency department 01/10/2022 with complaints of accelerated chest discomfort.  Underwent cardiac catheterization on 01/12/2022 and received PCI with DES to his mid AV groove circumflex. Continue aspirin, Plavix, Praluent Heart healthy low-sodium diet Increase physical activity as tolerated May start cardiac rehab  Hyperlipidemia-LDL 48.  Statin intolerant Continue Praluent Heart healthy low-sodium high-fiber diet  Essential hypertension-BP today 102/60.  Well-controlled at home. Decrease Bystolic to 2.5 mg Heart healthy low-sodium diet-salty 6 given Increase physical activity as tolerated  Mitral valve regurgitation-no increased DOE or activity intolerance.  Echocardiogram showed mild-moderate mitral valve regurgitation. Continue to monitor  Type 2 diabetes-A1c 7.8 Continue glipizide, [place] Cancel with PCP  Disposition: Follow-up with Dr. Claiborne Billings as scheduled.   Jossie Ng. Wesly Whisenant NP-C     01/20/2022, 11:35 AM [place] [place] [address] Office [phone] Fax [phone]  Notice: This dictation was prepared with Dragon dictation along with smaller phrase technology. Any transcriptional errors that result from this process are unintentional and may not be corrected upon review.  I spent 14 minutes examining this patient, reviewing medications, and using patient centered shared decision making involving her cardiac care.  Prior to her visit I spent greater than 20 minutes reviewing her past medical history,  medications, and prior cardiac tests.

## 2022-01-20 ENCOUNTER — Telehealth: Payer: Self-pay | Admitting: General Practice

## 2022-01-20 ENCOUNTER — Ambulatory Visit: Payer: Medicare Other | Admitting: General Practice

## 2022-01-20 ENCOUNTER — Encounter: Payer: Self-pay | Admitting: General Practice

## 2022-01-20 ENCOUNTER — Ambulatory Visit (INDEPENDENT_AMBULATORY_CARE_PROVIDER_SITE_OTHER): Payer: Medicare Other | Admitting: General Practice

## 2022-01-20 VITALS — BP 102/60 | HR 67 | Ht 69.5 in | Wt 211.8 lb

## 2022-01-20 DIAGNOSIS — I340 Nonrheumatic mitral (valve) insufficiency: Secondary | ICD-10-CM

## 2022-01-20 DIAGNOSIS — E785 Hyperlipidemia, unspecified: Secondary | ICD-10-CM

## 2022-01-20 DIAGNOSIS — I10 Essential (primary) hypertension: Secondary | ICD-10-CM

## 2022-01-20 DIAGNOSIS — I25709 Atherosclerosis of coronary artery bypass graft(s), unspecified, with unspecified angina pectoris: Secondary | ICD-10-CM | POA: Diagnosis not present

## 2022-01-20 DIAGNOSIS — E119 Type 2 diabetes mellitus without complications: Secondary | ICD-10-CM

## 2022-01-20 MED ORDER — NEBIVOLOL HCL 2.5 MG PO TABS
2.5000 mg | ORAL_TABLET | Freq: Every day | ORAL | 6 refills | Status: DC
Start: 2022-01-20 — End: 2022-05-01

## 2022-01-20 NOTE — Patient Instructions (Signed)
Medication Instructions:  DECREASE BYSTOLIC 2.5MG  DAILY  *If you need a refill on your cardiac medications before your next appointment, please call your pharmacy*  Lab Work:   Testing/Procedures:  NONE    NONE If you have labs (blood work) drawn today and your tests are completely normal, you will receive your results only by: MyChart Message (if you have MyChart) OR  A paper copy in the mail If you have any lab test that is abnormal or we need to change your treatment, we will call you to review the results.  Special Instructions PLEASE INCREASE PHYSICAL ACTIVITY AS TOLERATED   PLEASE READ AND FOLLOW INCREASED FIBER DIET  Follow-Up: Your next appointment:  KEEP SCHEDULED  APPOINTMENT   In Person with Nicki Guadalajara, MD    At [facility], you and your health needs are our priority.  As part of our continuing mission to provide you with exceptional heart care, we have created designated Provider Care Teams.  These Care Teams include your primary Cardiologist (physician) and Advanced Practice Providers (APPs -  Physician Assistants and Nurse Practitioners) who all work together to provide you with the care you need, when you need it.  Important Information About Sugar     High-Fiber Eating Plan Fiber, also called dietary fiber, is a type of carbohydrate. It is found foods such as fruits, vegetables, whole grains, and beans. A high-fiber diet can have many health benefits. Your health care provider may recommend a high-fiber diet to help: Prevent constipation. Fiber can make your bowel movements more regular. Lower your cholesterol. Relieve the following conditions: Inflammation of veins in the anus (hemorrhoids). Inflammation of specific areas of the digestive tract (uncomplicated diverticulosis). A problem of the large intestine, also called the colon, that sometimes causes pain and diarrhea (irritable bowel syndrome, or IBS). Prevent overeating as part of a weight-loss  plan. Prevent heart disease, type 2 diabetes, and certain cancers. What are tips for following this plan? Reading food labels  Check the nutrition facts label on food products for the amount of dietary fiber. Choose foods that have 5 grams of fiber or more per serving. The goals for recommended daily fiber intake include: Men (age 76 or younger): 34-38 g. Men (over age 67): 28-34 g. Women (age 52 or younger): 25-28 g. Women (over age 35): 22-25 g. Shopping Choose whole fruits and vegetables instead of processed forms, such as apple juice or applesauce. Choose a wide variety of high-fiber foods such as avocados, lentils, oats, and kidney beans. Read the nutrition facts label of the foods you choose. Be aware of foods with added fiber. These foods often have high sugar and sodium amounts per serving. Cooking Use whole-grain flour for baking and cooking. Cook with brown rice instead of white rice. Meal planning Start the day with a breakfast that is high in fiber, such as a cereal that contains 5 g of fiber or more per serving. Eat breads and cereals that are made with whole-grain flour instead of refined flour or white flour. Eat brown rice, bulgur wheat, or millet instead of white rice. Use beans in place of meat in soups, salads, and pasta dishes. Be sure that half of the grains you eat each day are whole grains. General information You can get the recommended daily intake of dietary fiber by: Eating a variety of fruits, vegetables, grains, nuts, and beans. Taking a fiber supplement if you are not able to take in enough fiber in your diet. It is better  to get fiber through food than from a supplement. Gradually increase how much fiber you consume. If you increase your intake of dietary fiber too quickly, you may have bloating, cramping, or gas. Drink plenty of water to help you digest fiber. Choose high-fiber snacks, such as berries, raw vegetables, nuts, and popcorn. What foods should  I eat? Fruits Berries. Pears. Apples. Oranges. Avocado. Prunes and raisins. Dried figs. Vegetables Sweet potatoes. Spinach. Kale. Artichokes. Cabbage. Broccoli. Cauliflower. Green peas. Carrots. Squash. Grains Whole-grain breads. Multigrain cereal. Oats and oatmeal. Brown rice. Barley. Bulgur wheat. Millet. Quinoa. Bran muffins. Popcorn. Rye wafer crackers. Meats and other proteins Navy beans, kidney beans, and pinto beans. Soybeans. Split peas. Lentils. Nuts and seeds. Dairy Fiber-fortified yogurt. Beverages Fiber-fortified soy milk. Fiber-fortified orange juice. Other foods Fiber bars. The items listed above may not be a complete list of recommended foods and beverages. Contact a dietitian for more information. What foods should I avoid? Fruits Fruit juice. Cooked, strained fruit. Vegetables Fried potatoes. Canned vegetables. Well-cooked vegetables. Grains White bread. Pasta made with refined flour. White rice. Meats and other proteins Fatty cuts of meat. Fried chicken or fried fish. Dairy Milk. Yogurt. Cream cheese. Sour cream. Fats and oils Butters. Beverages Soft drinks. Other foods Cakes and pastries. The items listed above may not be a complete list of foods and beverages to avoid. Talk with your dietitian about what choices are best for you. Summary Fiber is a type of carbohydrate. It is found in foods such as fruits, vegetables, whole grains, and beans. A high-fiber diet has many benefits. It can help to prevent constipation, lower blood cholesterol, aid weight loss, and reduce your risk of heart disease, diabetes, and certain cancers. Increase your intake of fiber gradually. Increasing fiber too quickly may cause cramping, bloating, and gas. Drink plenty of water while you increase the amount of fiber you consume. The best sources of fiber include whole fruits and vegetables, whole grains, nuts, seeds, and beans. This information is not intended to replace advice  given to you by your health care provider. Make sure you discuss any questions you have with your health care provider. Document Revised: 10/26/2019 Document Reviewed: 10/26/2019 Elsevier Patient Education  2023 [employer].

## 2022-01-20 NOTE — Telephone Encounter (Signed)
01/20/2022  Disability forms brought in by patient during appointment with Edd Fabian today.  Forms were processed and given back to nurse Marcelino Duster).   Need fax number to send forms to once complete.    ** Once forms are completed patient will need to come back into office to Sign release, billing and complete $29 forms fee (cash, check, or money order)

## 2022-01-23 DIAGNOSIS — Z0279 Encounter for issue of other medical certificate: Secondary | ICD-10-CM

## 2022-01-23 NOTE — Telephone Encounter (Signed)
  Pt's wife calling back to f/u. She asked if its completed so she can come by and pay the $29

## 2022-01-23 NOTE — Telephone Encounter (Signed)
Notified Donna-pt's Wife (DPR) she will come by today with Fax number as well.

## 2022-01-23 NOTE — Telephone Encounter (Signed)
01/23/2022 Release, Billing , and Payment Completed - Forms faxed

## 2022-01-26 ENCOUNTER — Other Ambulatory Visit: Payer: Self-pay | Admitting: Cardiovascular Disease

## 2022-02-09 MED ORDER — HYDROCODONE-ACETAMINOPHEN 10-325 MG PO TABS: 1.0000 | ORAL_TABLET | Freq: Four times a day (QID) | ORAL | 0 refills | Status: AC | PRN

## 2022-02-09 NOTE — Telephone Encounter (Signed)
His sciatica is acting up, so we will send in a few more Norco tablets

## 2022-02-12 ENCOUNTER — Other Ambulatory Visit: Payer: Self-pay | Admitting: Family Medicine

## 2022-02-12 ENCOUNTER — Telehealth: Payer: Self-pay | Admitting: Family Medicine

## 2022-02-12 ENCOUNTER — Encounter: Payer: Self-pay | Admitting: Family Medicine

## 2022-02-12 ENCOUNTER — Other Ambulatory Visit: Payer: Self-pay | Admitting: Cardiovascular Disease

## 2022-02-12 ENCOUNTER — Ambulatory Visit (INDEPENDENT_AMBULATORY_CARE_PROVIDER_SITE_OTHER): Payer: Medicare Other | Admitting: Family Medicine

## 2022-02-12 VITALS — BP 140/72 | HR 88 | Temp 97.7°F | Wt 211.6 lb

## 2022-02-12 DIAGNOSIS — M5432 Sciatica, left side: Secondary | ICD-10-CM | POA: Diagnosis not present

## 2022-02-12 MED ORDER — CYCLOBENZAPRINE HCL 10 MG PO TABS: 10.0000 mg | ORAL_TABLET | Freq: Two times a day (BID) | ORAL | 0 refills | Status: DC | PRN

## 2022-02-12 NOTE — Telephone Encounter (Signed)
Spoke with pharmacist tech about patient's medication, she stated that due to the patient's insurance he is only allowed a 7 day supply of pain medication every 90 days.    Called patient, spoke with wife informed of message, voiced understanding..   [facility] per wife [facility]    Message complete.

## 2022-02-12 NOTE — Telephone Encounter (Signed)
Pt's wife called to say pharmacy is still saying MD has still not signed off on the  HYDROcodone-acetaminophen (NORCO) 10-325 MG tablet  Pt is in a lot of pain and needs his Rx as soon as possible.  [facility]  [id number] [place], [place] - [address] Phone:  [phone]  Fax:  [phone]     Please advise.

## 2022-02-12 NOTE — Progress Notes (Signed)
Subjective:    Patient ID: [name], male    DOB: 1950-11-19, 71 y.o.   MRN: [id number]  Chief Complaint  Patient presents with   Pain    Leg and hip pain, thinks it is sciatic nerve since Saturday or Sunday. Feel bad pain in lower left leg (calf). PCP sent in Rx but pharmacy told him ins has still not approved the Rx.  Pt accompanied by his wife.  HPI Patient was seen today for acute concern.  Pt with L sided hip pain going down into lateral L ankle.  Pain keeps pt up at night.  States pcp sent in rx for hydrocodone but pharmacy hasn't filled it.  Pt had leftover hydrocodone, took 2 pills his am.  Only using when needed as knows what the med can do.  No longer carrying wallet in back pocket.  Past Medical History:  Diagnosis Date   CAD (coronary artery disease)    nuclear stress test-09/09/11 low risk scan EF61%, sees Dr. Tresa Endo    Chronic headache    Chronic neck pain    Diabetes mellitus    sees Dr. Lonzo Cloud   Hyperlipidemia    Hypertension 12/26/2008   ECHO- EF>55%   Insomnia     Allergies  Allergen Reactions   Codeine Anaphylaxis    Pt. verbalized that he has taken and can take Hydrocodone, Oxycodone w/o issue - codeine allergy only   Ambien [Zolpidem Tartrate] Other (See Comments)    Headache    Crestor [Rosuvastatin]     Myalgia    Isordil [Isosorbide Nitrate] Other (See Comments)    Headaches   Lipitor [Atorvastatin] Other (See Comments)    Myalgia    Zetia [Ezetimibe] Other (See Comments)    Myalgia    Cipro [Ciprofloxacin Hcl] Hives and Rash   Victoza [Liraglutide] Rash    ROS General: Denies fever, chills, night sweats, changes in weight, changes in appetite HEENT: Denies headaches, ear pain, changes in vision, rhinorrhea, sore throat CV: Denies CP, palpitations, SOB, orthopnea Pulm: Denies SOB, cough, wheezing GI: Denies abdominal pain, nausea, vomiting, diarrhea, constipation GU: Denies dysuria, hematuria, frequency Msk: Denies muscle cramps,  joint pains  +L hip pain into LLE Neuro: Denies weakness, numbness, tingling Skin: Denies rashes, bruising Psych: Denies depression, anxiety, hallucinations    Objective:    Blood pressure (!) 140/72, pulse 88, temperature 97.7 F (36.5 C), temperature source Oral, weight 211 lb 9.6 oz (96 kg), SpO2 96 %.  Gen. Pleasant, well-nourished, in no distress, normal affect   HEENT: [place]/AT, face symmetric, conjunctiva clear, no scleral icterus, PERRLA, EOMI, nares patent without drainage Lungs: no accessory muscle use Cardiovascular: RRR,  no peripheral edema.   Musculoskeletal: No erythema of calf or edema.  Pain in posterio-lateral L hip down post leg to lateral L ankle.  No deformities, no cyanosis or clubbing, normal tone Neuro:  A&Ox3, CN II-XII intact, ambulating with a cane Skin:  Warm, no lesions/ rash   Wt Readings from Last 3 Encounters:  02/12/22 211 lb 9.6 oz (96 kg)  01/20/22 211 lb 12.8 oz (96.1 kg)  01/10/22 206 lb (93.4 kg)    Lab Results  Component Value Date   WBC 12.3 (H) 01/13/2022   HGB 11.0 (L) 01/13/2022   HCT 34.5 (L) 01/13/2022   PLT 181 01/13/2022   GLUCOSE 128 (H) 01/13/2022   CHOL 102 01/11/2022   TRIG 57 01/11/2022   HDL 43 01/11/2022   LDLDIRECT 128.0 10/29/2011   LDLCALC 48  01/11/2022   ALT 21 11/10/2021   AST 25 11/10/2021   NA 139 01/13/2022   K 3.9 01/13/2022   CL 109 01/13/2022   CREATININE 1.09 01/13/2022   BUN 12 01/13/2022   CO2 23 01/13/2022   TSH 2.702 01/10/2022   PSA 1.57 08/05/2021   HGBA1C 7.8 (H) 01/10/2022   MICROALBUR 0.5 08/21/2008    Assessment/Plan:  Left sided sciatica  -discussed contacting insurance company/pharmacy about hydrocodone rx -Continue care ice, stretching, topical analgesics -Try to avoid steroid given history of DM. -Offered referral to PT.  Patient declines. -Muscle relaxer as needed - Plan: cyclobenzaprine (FLEXERIL) 10 MG tablet  F/u with PCP as needed  Abbe Amsterdam, MD

## 2022-02-16 ENCOUNTER — Telehealth (HOSPITAL_COMMUNITY): Payer: Self-pay

## 2022-02-16 ENCOUNTER — Other Ambulatory Visit (HOSPITAL_COMMUNITY): Payer: Self-pay

## 2022-02-16 NOTE — Telephone Encounter (Signed)
Pharmacy Transitions of Care Follow-up Telephone Call  Date of discharge: 01/13/2022  Discharge Diagnosis: Unstable angina   How have you been since you were released from the hospital? Patient has been doing well since discharge. He has no questions or concerns regarding medications.    Medication changes made at discharge: START taking: clopidogrel (PLAVIX)  CHANGE how you take: amLODipine (NORVASC)  STOP taking: ADVIL PM PO   Medication changes verified by the patient? Yes    Medication Accessibility:  [facility]   Was the patient provided with refills on discharged medications? Yes   Have all prescriptions been transferred from [facility] to home pharmacy? Yes   Is the patient able to afford medications? Has insurance, [employer]  Notable copays: $9 for 90ds   Medication Review:  CLOPIDOGREL (PLAVIX) Clopidogrel 75 mg once daily.  - Advised patient of medications to avoid (NSAIDs, ASA)  - Educated that Tylenol (acetaminophen) will be the preferred analgesic to prevent risk of bleeding  - Emphasized importance of monitoring for signs and symptoms of bleeding (abnormal bruising, prolonged bleeding, nose bleeds, bleeding from gums, discolored urine, black tarry stools)  - Advised patient to alert all providers of anticoagulation therapy prior to starting a new medication or having a procedure   Follow-up Appointments:  PCP Hospital f/u appt confirmed? Saw Edd Fabian, NP on 01/20/2022.  If their condition worsens, is the pt aware to call PCP or go to the Emergency Dept.? Yes  Final Patient Assessment: Patient has had follow-up with PCP and has refills sent to home pharmacy.

## 2022-02-17 ENCOUNTER — Encounter: Payer: Self-pay | Admitting: Physician Assistant

## 2022-02-17 ENCOUNTER — Ambulatory Visit (INDEPENDENT_AMBULATORY_CARE_PROVIDER_SITE_OTHER): Payer: Medicare Other | Admitting: Physician Assistant

## 2022-02-17 DIAGNOSIS — M5442 Lumbago with sciatica, left side: Secondary | ICD-10-CM | POA: Diagnosis not present

## 2022-02-17 DIAGNOSIS — G8929 Other chronic pain: Secondary | ICD-10-CM | POA: Diagnosis not present

## 2022-02-17 MED ORDER — MELOXICAM 7.5 MG PO TABS: 7.5000 mg | ORAL_TABLET | Freq: Every day | ORAL | 0 refills | Status: DC

## 2022-02-17 NOTE — Progress Notes (Signed)
Office Visit Note   Patient: [name]           Date of Birth: 12/11/1950           MRN: [id number] Visit Date: 02/17/2022              Requested by: Nelwyn Salisbury, MD [address] [place],  [place] [address] PCP: Nelwyn Salisbury, MD  Chief Complaint  Patient presents with   Lower Back - New Patient (Initial Visit)      HPI: Patient is a pleasant 71 year old gentleman with a chief complaint of left lower back pain with radiation down his leg to his ankle.  He denies any injury.  This has been going on since May.  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  He does have diabetes and was placed on oral steroids which did cause his blood glucoses to rise.  No loss of bowel or bladder control.  His leg does sometimes give out because of the pain after just walking a short period.  He uses a cane  Assessment & Plan: Visit Diagnoses:  1. Chronic low back pain with left-sided sciatica, unspecified back pain laterality     Plan: Patient has tried oral steroids but cannot take them too much because of his diabetes.  I do think he would be a better candidate for an injection but needs a CT scan first.  He cannot have an MRI because of previous cardiac surgery.  In the meantime we will give him a small amount of low-dose meloxicam.  He is not to take this with other anti-inflammatories.  Patient was given strict parameters for follow-up if things get worse.  Follow-Up Instructions: After CT scan  Ortho Exam  Patient is alert, oriented, no adenopathy, well-dressed, normal affect, normal respiratory effort. Patient is ambulating with a cane.  Feels better a little stooped over than standing up straight.  Pain is focused over the left lower back and radiates down the lateral side of the leg.  Strength is 5 out of 5 with dorsiflexion plantarflexion extension and flexion of his legs.  Sensation is  intact  Imaging: No results found. No images are attached to the encounter.  Labs: Lab Results  Component Value Date   HGBA1C 7.8 (H) 01/10/2022   HGBA1C 7.6 (A) 09/17/2021   HGBA1C 7.5 (A) 03/19/2021     Lab Results  Component Value Date   ALBUMIN 4.4 11/10/2021   ALBUMIN 4.3 08/05/2021   ALBUMIN 4.6 08/15/2020    No results found for: "MG" No results found for: "VD25OH"  No results found for: "PREALBUMIN"    Latest Ref Rng & Units 01/13/2022    4:31 AM 01/12/2022    1:49 AM 01/11/2022   12:29 AM  CBC EXTENDED  WBC 4.0 - 10.5 K/uL 12.3  12.3  7.8   RBC 4.22 - 5.81 MIL/uL 4.15  4.36  4.31   Hemoglobin 13.0 - 17.0 g/dL 47.4  25.9  56.3   HCT 39.0 - 52.0 % 34.5  36.1  35.1   Platelets 150 - 400 K/uL 181  192  203      There is no height or weight on file to calculate BMI.  Orders:  Orders Placed This Encounter  Procedures   CT LUMBAR SPINE WO CONTRAST   Meds ordered this encounter  Medications   meloxicam (MOBIC) 7.5 MG tablet  Sig: Take 1 tablet (7.5 mg total) by mouth daily.    Dispense:  20 tablet    Refill:  0     Procedures: No procedures performed  Clinical Data: No additional findings.  ROS:  All other systems negative, except as noted in the HPI. Review of Systems  Objective: Vital Signs: There were no vitals taken for this visit.  Specialty Comments:  No specialty comments available.  PMFS History: Patient Active Problem List   Diagnosis Date Noted   Unstable angina (HCC) 01/10/2022   BPH with urinary obstruction 08/05/2021   Erectile dysfunction 08/05/2021   Type 2 diabetes mellitus without complication, without long-term current use of insulin (HCC) 08/04/2017   Osteoarthritis 04/28/2017   Insomnia 03/03/2017   Fatigue 05/28/2014   Anxiety state 06/09/2010   NECK PAIN 09/03/2009   LEG PAIN 07/16/2008   HEADACHE 07/16/2008   LOSS, HEARING NOS 05/03/2007   Hyperlipidemia 04/04/2007   Essential hypertension 04/04/2007    Coronary atherosclerosis 04/04/2007   Past Medical History:  Diagnosis Date   CAD (coronary artery disease)    nuclear stress test-09/09/11 low risk scan EF61%, sees Dr. Tresa Endo    Chronic headache    Chronic neck pain    Diabetes mellitus    sees Dr. Lonzo Cloud   Hyperlipidemia    Hypertension 12/26/2008   ECHO- EF>55%   Insomnia     Family History  Problem Relation Age of Onset   Liver cancer Father    Cancer Brother    Cancer Brother    Cancer Brother    Coronary artery disease Other    Hypertension Other    Heart disease Mother    Colon cancer Neg Hx    Stomach cancer Neg Hx    Esophageal cancer Neg Hx     Past Surgical History:  Procedure Laterality Date   APPENDECTOMY     cervical spine injection     COLONOSCOPY  04-02-06    per [facility], clear, repeat in 10 yrs   CORONARY ARTERY BYPASS GRAFT  2002   4 vessels   CORONARY STENT INTERVENTION N/A 01/12/2022   Procedure: CORONARY STENT INTERVENTION;  Surgeon: Runell Gess, MD;  Location: [facility];  Service: Cardiovascular;  Laterality: N/A;   INTRAVASCULAR PRESSURE WIRE/FFR STUDY N/A 01/12/2022   Procedure: INTRAVASCULAR PRESSURE WIRE/FFR STUDY;  Surgeon: Runell Gess, MD;  Location: [facility];  Service: Cardiovascular;  Laterality: N/A;   LEFT HEART CATH AND CORS/GRAFTS ANGIOGRAPHY N/A 01/12/2022   Procedure: LEFT HEART CATH AND CORS/GRAFTS ANGIOGRAPHY;  Surgeon: Runell Gess, MD;  Location: [facility];  Service: Cardiovascular;  Laterality: N/A;   Social History   Occupational History   Not on file  Tobacco Use   Smoking status: Former    Packs/day: 0.25    Years: 10.00    Total pack years: 2.50    Types: Cigarettes    Quit date: 11/26/1970    Years since quitting: 51.2   Smokeless tobacco: Never  Vaping Use   Vaping Use: Never used  Substance and Sexual Activity   Alcohol use: No    Alcohol/week: 0.0 standard drinks of alcohol   Drug use: No   Sexual activity: Not  on file

## 2022-02-18 ENCOUNTER — Telehealth: Payer: Self-pay | Admitting: Family Medicine

## 2022-02-18 NOTE — Telephone Encounter (Signed)
PA completed.  Your information has been submitted to [employer]. To check for an updated outcome later, reopen this PA request from your dashboard.  If [employer] has not responded to your request within 24 hours, contact [employer] at [phone].

## 2022-02-18 NOTE — Telephone Encounter (Signed)
Called the pharmacy, Prior authorization is required.

## 2022-02-18 NOTE — Telephone Encounter (Signed)
Pt called to say the pharmacy is saying MD &/or the insurance company has yet to sign off on this medication:  HYDROcodone-acetaminophen (NORCO) 10-325 MG tablet [[id number]]    Last OV:  02/12/2022  Pt states he is in a lot of pain and really needs some relief.  Please advise.   [id number] [place], [place] - [address] AT [address] Phone:  [phone]  Fax:  [phone]

## 2022-02-18 NOTE — Telephone Encounter (Signed)
Pt wife [name] is calling and pt will pay out of pocket for the remaining hydrocodone and would need office to call the pharm  [id number] [place], [place] - [address] AT [address] Phone:  [phone]  Fax:  [phone]

## 2022-02-19 NOTE — Telephone Encounter (Signed)
Error. Please disregard

## 2022-02-19 NOTE — Telephone Encounter (Signed)
PA for Hydrocodone-Acetaminophen 10-325 mg is approved from 02/18/22 to 08/21/22.   [facility] lvm on pharmacy line  PA approval information and to contact patient when prescription ready for pickup

## 2022-02-19 NOTE — Telephone Encounter (Signed)
Message complete.  See previous message 

## 2022-02-26 ENCOUNTER — Ambulatory Visit (INDEPENDENT_AMBULATORY_CARE_PROVIDER_SITE_OTHER): Payer: Medicare Other

## 2022-02-26 ENCOUNTER — Other Ambulatory Visit: Payer: Self-pay | Admitting: Physician Assistant

## 2022-02-26 ENCOUNTER — Ambulatory Visit (INDEPENDENT_AMBULATORY_CARE_PROVIDER_SITE_OTHER): Payer: Medicare Other | Admitting: Physician Assistant

## 2022-02-26 ENCOUNTER — Encounter: Payer: Self-pay | Admitting: Physician Assistant

## 2022-02-26 ENCOUNTER — Other Ambulatory Visit: Payer: Self-pay | Admitting: Internal Medicine

## 2022-02-26 DIAGNOSIS — G8929 Other chronic pain: Secondary | ICD-10-CM | POA: Diagnosis not present

## 2022-02-26 DIAGNOSIS — M5442 Lumbago with sciatica, left side: Secondary | ICD-10-CM

## 2022-02-26 DIAGNOSIS — M25552 Pain in left hip: Secondary | ICD-10-CM | POA: Diagnosis not present

## 2022-02-26 NOTE — Progress Notes (Signed)
Office Visit Note   Patient: [name]           Date of Birth: 05-Sep-1950           MRN: [id number] Visit Date: 02/26/2022              Requested by: Nelwyn Salisbury, MD [address] [place],  [place] [address] PCP: Nelwyn Salisbury, MD  Left hip pain    HPI: Patient is a pleasant 71 year old gentleman who I saw about a week ago.  He has a history of low back pain and was seen for radicular findings with radiating from his posterior left buttock down to the lateral side of his leg to his ankle.  He describes it as burning.  I did schedule him for a CAT scan because he cannot have an MRI.  He comes in today because he is hesitant to schedule the CAT scan but he feels the problem is more secondary to his left hip  Assessment & Plan: Visit Diagnoses:  1. Chronic low back pain with left-sided sciatica, unspecified back pain laterality   2. Pain in left hip     Plan: Low back pain with radicular findings no running down the left.  I did explain to him that his pain that is in the posterior buttock and has radiation down to his ankle on the lateral side with associated burning is more likely coming from his back he also has findings consistent with stenosis and that he gets up and walks and cannot walk very far before his left leg gets tired and feels like it is going to give out he does not have any groin pain he does not have any pain with manipulation of the hip and he does not have any pain running to the get anterior thigh with movement.  Certainly he does have some arthritis in the left hip but certainly clinical findings are more consistent with his back.  He will get a CAT scan I will review this with Dr. Cleophas Dunker.  Follow-Up Instructions: No follow-ups on file.   Ortho Exam  Patient is alert, oriented, no adenopathy, well-dressed, normal affect, normal respiratory effort. Examination he has antalgic gait especially after sitting for a while.  He has focal pain in the  left posterior buttock.  His strength is 5 out of 5 he does have baseline neuropathy in his feet from his diabetes.  He has good strength with dorsiflexion plantarflexion resisted extension and flexion of his legs he has no groin pain.  He has some stiffness in his hip but with internal/external rotation cannot reproduce his symptoms  Imaging: XR Pelvis 1-2 Views  Result Date: 02/26/2022 Radiographs of his pelvis were reviewed today.  No evidence of acute bony changes.  He does have sclerotic changes in the acetabulum and some osteophyte formation in the femoral head no acute fractures findings consistent with mild to moderate arthritis  No images are attached to the encounter.  Labs: Lab Results  Component Value Date   HGBA1C 7.8 (H) 01/10/2022   HGBA1C 7.6 (A) 09/17/2021   HGBA1C 7.5 (A) 03/19/2021     Lab Results  Component Value Date   ALBUMIN 4.4 11/10/2021   ALBUMIN 4.3 08/05/2021   ALBUMIN 4.6 08/15/2020    No results found for: "MG" No results found for: "VD25OH"  No results found for: "PREALBUMIN"    Latest Ref Rng & Units 01/13/2022    4:31 AM 01/12/2022  1:49 AM 01/11/2022   12:29 AM  CBC EXTENDED  WBC 4.0 - 10.5 K/uL 12.3  12.3  7.8   RBC 4.22 - 5.81 MIL/uL 4.15  4.36  4.31   Hemoglobin 13.0 - 17.0 g/dL 75.1  02.5  85.2   HCT 39.0 - 52.0 % 34.5  36.1  35.1   Platelets 150 - 400 K/uL 181  192  203      There is no height or weight on file to calculate BMI.  Orders:  Orders Placed This Encounter  Procedures   XR Pelvis 1-2 Views   No orders of the defined types were placed in this encounter.    Procedures: No procedures performed  Clinical Data: No additional findings.  ROS:  All other systems negative, except as noted in the HPI. Review of Systems  Objective: Vital Signs: There were no vitals taken for this visit.  Specialty Comments:  No specialty comments available.  PMFS History: Patient Active Problem List   Diagnosis Date Noted    Pain in left hip 02/26/2022   Unstable angina (HCC) 01/10/2022   BPH with urinary obstruction 08/05/2021   Erectile dysfunction 08/05/2021   Type 2 diabetes mellitus without complication, without long-term current use of insulin (HCC) 08/04/2017   Osteoarthritis 04/28/2017   Insomnia 03/03/2017   Fatigue 05/28/2014   Anxiety state 06/09/2010   NECK PAIN 09/03/2009   LEG PAIN 07/16/2008   HEADACHE 07/16/2008   LOSS, HEARING NOS 05/03/2007   Hyperlipidemia 04/04/2007   Essential hypertension 04/04/2007   Coronary atherosclerosis 04/04/2007   Past Medical History:  Diagnosis Date   CAD (coronary artery disease)    nuclear stress test-09/09/11 low risk scan EF61%, sees Dr. Tresa Endo    Chronic headache    Chronic neck pain    Diabetes mellitus    sees Dr. Lonzo Cloud   Hyperlipidemia    Hypertension 12/26/2008   ECHO- EF>55%   Insomnia     Family History  Problem Relation Age of Onset   Liver cancer Father    Cancer Brother    Cancer Brother    Cancer Brother    Coronary artery disease Other    Hypertension Other    Heart disease Mother    Colon cancer Neg Hx    Stomach cancer Neg Hx    Esophageal cancer Neg Hx     Past Surgical History:  Procedure Laterality Date   APPENDECTOMY     cervical spine injection     COLONOSCOPY  04-02-06    per [facility], clear, repeat in 10 yrs   CORONARY ARTERY BYPASS GRAFT  2002   4 vessels   CORONARY STENT INTERVENTION N/A 01/12/2022   Procedure: CORONARY STENT INTERVENTION;  Surgeon: Runell Gess, MD;  [facility];  Service: Cardiovascular;  Laterality: N/A;   INTRAVASCULAR PRESSURE WIRE/FFR STUDY N/A 01/12/2022   Procedure: INTRAVASCULAR PRESSURE WIRE/FFR STUDY;  Surgeon: Runell Gess, MD;  Location: [facility];  Service: Cardiovascular;  Laterality: N/A;   LEFT HEART CATH AND CORS/GRAFTS ANGIOGRAPHY N/A 01/12/2022   Procedure: LEFT HEART CATH AND CORS/GRAFTS ANGIOGRAPHY;  Surgeon: Runell Gess, MD;   Location: [facility];  Service: Cardiovascular;  Laterality: N/A;   Social History   Occupational History   Not on file  Tobacco Use   Smoking status: Former    Packs/day: 0.25    Years: 10.00    Total pack years: 2.50    Types: Cigarettes  Quit date: 11/26/1970    Years since quitting: 51.2   Smokeless tobacco: Never  Vaping Use   Vaping Use: Never used  Substance and Sexual Activity   Alcohol use: No    Alcohol/week: 0.0 standard drinks of alcohol   Drug use: No   Sexual activity: Not on file

## 2022-03-01 ENCOUNTER — Other Ambulatory Visit: Payer: Self-pay | Admitting: Family Medicine

## 2022-03-01 ENCOUNTER — Other Ambulatory Visit: Payer: Self-pay | Admitting: Cardiovascular Disease

## 2022-03-01 DIAGNOSIS — N390 Urinary tract infection, site not specified: Secondary | ICD-10-CM

## 2022-03-03 ENCOUNTER — Other Ambulatory Visit: Payer: Medicare Other

## 2022-03-03 ENCOUNTER — Ambulatory Visit
Admission: RE | Admit: 2022-03-03 | Discharge: 2022-03-03 | Disposition: A | Discharge status: Home / Self Care | Payer: Medicare Other | Source: Ambulatory Visit | Attending: Physician Assistant | Admitting: Physician Assistant

## 2022-03-03 DIAGNOSIS — M79605 Pain in left leg: Secondary | ICD-10-CM | POA: Diagnosis not present

## 2022-03-03 DIAGNOSIS — G8929 Other chronic pain: Secondary | ICD-10-CM

## 2022-03-03 DIAGNOSIS — M545 Low back pain, unspecified: Secondary | ICD-10-CM | POA: Diagnosis not present

## 2022-03-05 ENCOUNTER — Other Ambulatory Visit: Payer: Self-pay | Admitting: Cardiovascular Disease

## 2022-03-05 ENCOUNTER — Telehealth: Payer: Self-pay | Admitting: Physician Assistant

## 2022-03-05 ENCOUNTER — Other Ambulatory Visit: Payer: Self-pay | Admitting: Physician Assistant

## 2022-03-05 DIAGNOSIS — G8929 Other chronic pain: Secondary | ICD-10-CM

## 2022-03-05 MED ORDER — OXYCODONE-ACETAMINOPHEN 5-325 MG PO TABS: 1.0000 | ORAL_TABLET | ORAL | 0 refills | Status: DC | PRN

## 2022-03-05 NOTE — Telephone Encounter (Signed)
Pt called requesting pain medication for left hip/leg pains. Pt states he can t stand the pain. Pt is asking for a call when meds sent to [facility] on [address]. Phone number is [phone].

## 2022-03-05 NOTE — Telephone Encounter (Signed)
Stat steroid injectio has been ordered for [facility] as requested.

## 2022-03-11 ENCOUNTER — Telehealth: Payer: Self-pay

## 2022-03-11 ENCOUNTER — Other Ambulatory Visit: Payer: Self-pay | Admitting: Physician Assistant

## 2022-03-11 ENCOUNTER — Telehealth: Payer: Self-pay | Admitting: Physician Assistant

## 2022-03-11 NOTE — Telephone Encounter (Signed)
Pt called requesting a refill of pain medication. Please send to pharmacy on file. Pt still has not heard from [facility] for injection. Please call pt at [phone].

## 2022-03-11 NOTE — Telephone Encounter (Signed)
Patient is calling to get a refill on his Percocet, as he will soon be out. He is taking 1 every 3-4 hours and using a liniment on his leg. This helps the pain in the leg, but is far from taking the pain away. Cathy with [facility] has been working on getting the patient in for the ESI, but is having to get clearance from his cardiologist for him to stop his Plavix. I have asked Lynden Ang to call the patient to update him on the status of this. If he can have the refill, just send it to the pharmacy on file. The patient's cb #[phone].

## 2022-03-12 ENCOUNTER — Other Ambulatory Visit: Payer: Self-pay | Admitting: Physician Assistant

## 2022-03-12 MED ORDER — OXYCODONE-ACETAMINOPHEN 5-325 MG PO TABS
1.0000 | ORAL_TABLET | Freq: Three times a day (TID) | ORAL | 0 refills | Status: DC | PRN
Start: 2022-03-12 — End: 2023-01-06

## 2022-03-21 ENCOUNTER — Other Ambulatory Visit: Payer: Self-pay | Admitting: Physician Assistant

## 2022-03-23 ENCOUNTER — Encounter: Payer: Self-pay | Admitting: Internal Medicine

## 2022-03-23 ENCOUNTER — Ambulatory Visit (INDEPENDENT_AMBULATORY_CARE_PROVIDER_SITE_OTHER): Payer: Medicare Other | Admitting: Internal Medicine

## 2022-03-23 VITALS — BP 120/70 | HR 61 | Ht 69.5 in | Wt 213.0 lb

## 2022-03-23 DIAGNOSIS — E1165 Type 2 diabetes mellitus with hyperglycemia: Secondary | ICD-10-CM

## 2022-03-23 LAB — POCT GLYCOSYLATED HEMOGLOBIN (HGB A1C): Hemoglobin A1C: 7.6 % — AB (ref 4.0–5.6)

## 2022-03-23 LAB — POCT GLUCOSE (DEVICE FOR HOME USE): POC Glucose: 136 mg/dl — AB (ref 70–99)

## 2022-03-23 MED ORDER — GLIPIZIDE 5 MG PO TABS: 5.0000 mg | ORAL_TABLET | Freq: Two times a day (BID) | ORAL | 3 refills | Status: DC

## 2022-03-23 MED ORDER — DAPAGLIFLOZIN PROPANEDIOL 10 MG PO TABS: 10.0000 mg | ORAL_TABLET | Freq: Every day | ORAL | 3 refills | Status: DC

## 2022-03-23 MED ORDER — PIOGLITAZONE HCL 45 MG PO TABS: 45.0000 mg | ORAL_TABLET | Freq: Every day | ORAL | 3 refills | Status: DC

## 2022-03-23 NOTE — Patient Instructions (Signed)
-   Continue Glipizide 5 mg, 1 tablet before Breakfast and 1 tablet before supper - Continue Actos 45 mg daily  - Continue Farxiga 10 mg once a day     HOW TO TREAT LOW BLOOD SUGARS (Blood sugar LESS THAN 70 MG/DL) Please follow the RULE OF 15 for the treatment of hypoglycemia treatment (when your (blood sugars are less than 70 mg/dL)   STEP 1: Take 15 grams of carbohydrates when your blood sugar is low, which includes:  3-4 GLUCOSE TABS  OR 3-4 OZ OF JUICE OR REGULAR SODA OR ONE TUBE OF GLUCOSE GEL    STEP 2: RECHECK blood sugar in 15 MINUTES STEP 3: If your blood sugar is still low at the 15 minute recheck --> then, go back to STEP 1 and treat AGAIN with another 15 grams of carbohydrates.

## 2022-03-23 NOTE — Progress Notes (Signed)
Name: [name]   Age/ Sex: 71 y.o. male   MRN/ DOB: [id number]/ [birth year]     PCP: Nelwyn Salisbury, MD   Reason for Endocrinology Evaluation: Type 2 Diabetes Mellitus  Initial Endocrine Consultative Visit: 08/31/2018    PATIENT IDENTIFIER: [name] is a 71 y.o. male with a past medical history of T2DM,CAD (S/P CABG). The patient has followed with Endocrinology clinic since 08/31/2018 for consultative assistance with management of his diabetes.  DIABETIC HISTORY:  Mr. Gatliff was diagnosed with T2DM in 2010, he has been on Metformin and Actos for many years, he is intolerant to Victoza due to rash. Januvia has been ineffective , which he stopped 08/2018. He has never been on insulin . His hemoglobin A1c has ranged from 6.1% in 2017, peaking at 10.4% in 2019  On his initial presentation his A1c was 8.5% . He was on Metformin and Actos . We added Farxiga   Glipizide started 04/2020 Self stopped Metformin due to diarrhea 09/2020  SUBJECTIVE:   During the last visit (09/17/2021): A1c 7.6%.     Today (03/23/2022): [name] is here for a  follow up visit on his diabetes.He checks his sugar occasionally. He did not bring his meter today. He denies hypoglycemia since his last visit of here     S/P PCI July 2023 for unstable angina Wife had to go to the hospital  Had  sciatica but this is getting better  Denies nausea, vomiting or diarrhea  Has been eating figs  Wife    HOME DIABETES REGIMEN:  Actos 45 mg daily  Farxiga 10 mg daily Glipizide 5 mg BID     GLUCOSE LOG : Did not bring     DIABETIC COMPLICATIONS: Microvascular complications:  Neuropathy  Denies: ckd , retinopathy  Last eye exam: Completed 2023    Macrovascular complications:  CAD ( S/p CABG) , S/P PCI 01/2022 Denies: PVD, CVA      HISTORY:  Past Medical History:  Past Medical History:  Diagnosis Date   CAD (coronary artery disease)    nuclear stress test-09/09/11 low  risk scan EF61%, sees Dr. Tresa Endo    Chronic headache    Chronic neck pain    Diabetes mellitus    sees Dr. Lonzo Cloud   Hyperlipidemia    Hypertension 12/26/2008   ECHO- EF>55%   Insomnia    Past Surgical History:  Past Surgical History:  Procedure Laterality Date   APPENDECTOMY     cervical spine injection     COLONOSCOPY  04-02-06    per [facility], clear, repeat in 10 yrs   CORONARY ARTERY BYPASS GRAFT  2002   4 vessels   CORONARY STENT INTERVENTION N/A 01/12/2022   Procedure: CORONARY STENT INTERVENTION;  Surgeon: Runell Gess, MD;  [facility]: [facility];  Service: Cardiovascular;  Laterality: N/A;   INTRAVASCULAR PRESSURE WIRE/FFR STUDY N/A 01/12/2022   Procedure: INTRAVASCULAR PRESSURE WIRE/FFR STUDY;  Surgeon: Runell Gess, MD;  Location: [facility];  Service: Cardiovascular;  Laterality: N/A;   LEFT HEART CATH AND CORS/GRAFTS ANGIOGRAPHY N/A 01/12/2022   Procedure: LEFT HEART CATH AND CORS/GRAFTS ANGIOGRAPHY;  Surgeon: Runell Gess, MD;  [facility];  Service: Cardiovascular;  Laterality: N/A;   Social History:  reports that he quit smoking about 51 years ago. His smoking use included cigarettes. He has a 2.50 pack-year smoking history. He has never used smokeless tobacco. He reports that he  does not drink alcohol and does not use drugs. Family History:  Family History  Problem Relation Age of Onset   Liver cancer Father    Cancer Brother    Cancer Brother    Cancer Brother    Coronary artery disease Other    Hypertension Other    Heart disease Mother    Colon cancer Neg Hx    Stomach cancer Neg Hx    Esophageal cancer Neg Hx      HOME MEDICATIONS: Allergies as of 03/23/2022       Reactions   Codeine Anaphylaxis   Pt. verbalized that he has taken and can take Hydrocodone, Oxycodone w/o issue - codeine allergy only   Ambien [zolpidem Tartrate] Other (See Comments)   Headache    Crestor [rosuvastatin]    Myalgia     Isordil [isosorbide Nitrate] Other (See Comments)   Headaches   Lipitor [atorvastatin] Other (See Comments)   Myalgia    Zetia [ezetimibe] Other (See Comments)   Myalgia    Cipro [ciprofloxacin Hcl] Hives, Rash   Victoza [liraglutide] Rash        Medication List        Accurate as of March 23, 2022 11:02 AM. If you have any questions, ask your nurse or doctor.          STOP taking these medications    cyclobenzaprine 10 MG tablet Commonly known as: FLEXERIL Stopped by: Scarlette Shorts, MD   meloxicam 7.5 MG tablet Commonly known as: MOBIC Stopped by: Scarlette Shorts, MD   sulfamethoxazole-trimethoprim 800-160 MG tablet Commonly known as: BACTRIM DS Stopped by: Scarlette Shorts, MD   tamsulosin 0.4 MG Caps capsule Commonly known as: FLOMAX Stopped by: Scarlette Shorts, MD       TAKE these medications    amLODipine 5 MG tablet Commonly known as: NORVASC Take 5 mg by mouth daily.   aspirin 81 MG tablet Take 81 mg by mouth daily.   clonazePAM 0.5 MG tablet Commonly known as: KLONOPIN Take 1 tablet (0.5 mg total) by mouth 2 (two) times daily as needed for anxiety.   clopidogrel 75 MG tablet Commonly known as: PLAVIX Take 1 tablet (75 mg total) by mouth daily with breakfast.   CoQ10 100 MG Caps Take 100 mg by mouth daily.   Farxiga 10 MG Tabs tablet Generic drug: dapagliflozin propanediol TAKE 1 TABLET BY MOUTH EVERY DAY What changed: how much to take   gabapentin 300 MG capsule Commonly known as: NEURONTIN TAKE 3 CAPSULES BY MOUTH AT BEDTIME   glipiZIDE 5 MG tablet Commonly known as: GLUCOTROL Take 1 tablet (5 mg total) by mouth 2 (two) times daily before a meal.   lisinopril 40 MG tablet Commonly known as: ZESTRIL TAKE 1 TABLET BY MOUTH EVERY DAY   MELATONIN GUMMIES PO Take 2 each by mouth at bedtime.   nebivolol 2.5 MG tablet Commonly known as: BYSTOLIC Take 1 tablet (2.5 mg total) by mouth daily.    nitroGLYCERIN 0.4 MG SL tablet Commonly known as: NITROSTAT Place 1 tablet (0.4 mg total) under the tongue every 5 (five) minutes as needed for chest pain.   Omega-3 350 MG Cpdr Take 350 mg by mouth daily.   oxyCODONE-acetaminophen 5-325 MG tablet Commonly known as: PERCOCET/ROXICET Take 1 tablet by mouth every 8 (eight) hours as needed for severe pain.   pioglitazone 45 MG tablet Commonly known as: ACTOS TAKE 1 TABLET BY MOUTH EVERY DAY   Praluent 75 MG/ML  Soaj Generic drug: Alirocumab Inject 75 mg into the skin every 14 (fourteen) days.   sildenafil 100 MG tablet Commonly known as: Viagra Take 1 tablet (100 mg total) by mouth daily as needed for erectile dysfunction.       PHYSICAL EXAM: VS: BP 120/70 (BP Location: Left Arm, Patient Position: Sitting, Cuff Size: Small)   Pulse 61   Ht 5' 9.5" (1.765 m)   Wt 213 lb (96.6 kg)   SpO2 99%   BMI 31.00 kg/m    EXAM: General: Pt appears well and is in NAD  Lungs: Clear with good BS bilat with no rales, rhonchi, or wheezes  Heart: Auscultation: RRR with normal S1 and S2, no gallops or murmurs  Abdomen: Normoactive bowel sounds, soft, nontender, without masses or organomegaly palpable  Extremities: BL LE: no pretibial edema normal ROM and strength, no joint enlargement or tenderness  Mental Status: Judgment, insight: intact Mood and affect: no depression, anxiety, or agitation     DM Foot Exam 09/17/2021 The skin of the feet is intact without sores or ulcerations. The pedal pulses are 2+ on right and 2+ on left. The sensation is intact to a screening 5.07, 10 gram monofilament bilaterally   DATA REVIEWED:  Lab Results  Component Value Date   HGBA1C 7.6 (A) 03/23/2022   HGBA1C 7.8 (H) 01/10/2022   HGBA1C 7.6 (A) 09/17/2021    Latest Reference Range & Units 01/13/22 04:31  Sodium 135 - 145 mmol/L 139  Potassium 3.5 - 5.1 mmol/L 3.9  Chloride 98 - 111 mmol/L 109  CO2 22 - 32 mmol/L 23  Glucose 70 - 99 mg/dL 324128  (H)  BUN 8 - 23 mg/dL 12  Creatinine 4.010.61 - 0.271.24 mg/dL 2.531.09  Calcium 8.9 - 66.410.3 mg/dL 8.7 (L)  Anion gap 5 - 15  7  GFR, Estimated >60 mL/min >60      ASSESSMENT / PLAN / RECOMMENDATIONS:   1) Type 2 Diabetes Mellitus, Acceptable control, With neuropathic and macrovascular complications - Most recent A1c of 7.6  %. Goal A1c < 7.0 %.     - A1c stable  - Pt has been eating figs which he understands would cause hyperglycemia  - I am unable to verify medication intake as his wife manages his "pill box" and he takes what's there  - Intolerant to Metformin due to diarrhea  - NO changes    MEDICATIONS: Continue  Glipizide 5 mg , 1 tablet twice a day Continue Farxiga 10 mg daily Continue Actos 45 mg daily   EDUCATION / INSTRUCTIONS: BG monitoring instructions: Patient is instructed to check his blood sugars 1 times a day, fasting . Call [facility] if: BG persistently < 70  I reviewed the Rule of 15 for the treatment of hypoglycemia in detail with the patient. Literature supplied.    2) Diabetic complications:  Eye: Does not have known diabetic retinopathy.  Neuro/ Feet: Does have known diabetic peripheral neuropathy. Renal: Patient does not have known baseline CKD. He is on an ACEI/ARB at present.    F/U in 6 months    Signed electronically by: Lyndle HerrlichAbby Jaralla Avelynn Sellin, MD  [facility]  [facility] [address] [place], [place] [address] Phone: [phone] FAX: [phone]   CC: Nelwyn SalisburyFry, Stephen A, MD [address] [place] [address] Phone: [phone]  Fax: [phone]  Return to Endocrinology clinic as below: Future Appointments  Date Time Provider Department Center  03/23/2022 11:30 AM Nuala Chiles,  Melanie Crazier, MD [facility] None  04/02/2022 10:30 AM Troy Sine, MD [facility] None  10/15/2022  9:00 AM [place] [facility]

## 2022-04-02 ENCOUNTER — Encounter: Payer: Self-pay | Admitting: Cardiovascular Disease

## 2022-04-02 ENCOUNTER — Ambulatory Visit: Payer: Medicare Other | Attending: Cardiovascular Disease | Admitting: Cardiovascular Disease

## 2022-04-02 VITALS — BP 130/68 | HR 78 | Ht 69.5 in | Wt 213.0 lb

## 2022-04-02 DIAGNOSIS — I10 Essential (primary) hypertension: Secondary | ICD-10-CM | POA: Diagnosis not present

## 2022-04-02 DIAGNOSIS — E119 Type 2 diabetes mellitus without complications: Secondary | ICD-10-CM

## 2022-04-02 DIAGNOSIS — Z951 Presence of aortocoronary bypass graft: Secondary | ICD-10-CM

## 2022-04-02 DIAGNOSIS — Z8616 Personal history of COVID-19: Secondary | ICD-10-CM

## 2022-04-02 DIAGNOSIS — I25709 Atherosclerosis of coronary artery bypass graft(s), unspecified, with unspecified angina pectoris: Secondary | ICD-10-CM | POA: Diagnosis not present

## 2022-04-02 DIAGNOSIS — E785 Hyperlipidemia, unspecified: Secondary | ICD-10-CM | POA: Diagnosis not present

## 2022-04-02 DIAGNOSIS — E7841 Elevated Lipoprotein(a): Secondary | ICD-10-CM

## 2022-04-02 NOTE — Progress Notes (Signed)
Patient ID: [name], male   DOB: 04/07/1951, 71 y.o.   MRN: [id number]       Primary M.D.: Dr. Alysia Penna  HPI: [name] is a 71 y.o. male presents to the office today for a 10 month follow up cardiology evaluation.   [name] has CAD and in June 2002 underwent CABG surgery by Dr. Roxan Hockey with a sequential LIMA graft to the second diagonal and LAD, and sequential radial graft to the first and third diagonal branches. An echo Doppler study in 2010 showed normal systolic function with mild MR and trace TR.  A  nuclear perfusion study in March 2013 showed normal perfusion and function without scar or ischemia.  Additional problems include hypertension, type 2 diabetes mellitus, and hyperlipidemia. Remotely, he had worked as a Garment/textile technologist minor and in the past was some question of possible black lung. 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  In November 2015 he complained of a slight change in symptomatology with exertional shortness of breath and some arm weakness.  Prior to bypass surgery, he had never experienced chest pain but demonstrated exertional shortness of breath and arm discomfort.  He admitted to fatigue.  He denied any major episodes of chest pressure.  A 2-D echo Doppler study on 06/14/2014 showed an ejection fraction of 55-60% with mild focal basal hypertrophy of the septum; grade 1 diastolic dysfunction.  There was evidence for mild mitral regurgitation and moderate left atrial dilatation.  A nuclear perfusion study on December 11,2015 was interpreted as low risk.  He developed 1-2 mm of horizontal ST segment depression and had normal scintigraphic myocardial perfusion images.  Ejection fraction was 57%.  There was normal LV function without wall motion abnormality.  He has a history of hyperlipidemia and remotely self increased his simvastatin from 40 mg to 80 mg and developed occasional myalgias.   He has been on Bystolic 10 mg and lisinopril 30 mg for his hypertension.  He is on metformin 1000 mg twice a day as well as DiaBeta 5 mg for his type 2 diabetes mellitus.  When I saw him in October 2018, he had stopped taking Livalo for approximately one month.  He had been busy doing home projects and denied any recurrent chest pain.  During his last evaluation.  I had a lengthy discussion with him and recommended another attempt at a trial of Livalo.  I also added supplemental coenzyme Q 10.  Over this time period, he was able to reinitiate 2 mg daily and has been able to tolerate this without myalgias.  Subsequent blood work did show improvement in his lipid status on 08/04/2017 with a total cholesterol 149, LDL 82.  Triglycerides 148, and HDL 37.  He has continued to be on Januvia and metformin for his diabetes mellitus.  Hypertension.  He has remained on amlodipine 5 mg, lisinopril 30 mg, and Bystolic 10 mg.   When evaluated in March 2019 he was tolerating Livalo and I recommended further titration to 4 mg daily and attempt to obtain an LDL less than 70.  Since I saw him, he has had issues with poorly controlled diabetes mellitus.  Apparently he was started on farziga but developed some dizziness secondary to this.  He is now on metformin, Januvia, and was started on Actos.  Earlier this year he had a fall off a ladder.   I saw him in October 2019.  He required  rotator cuff surgery on his right shoulder.  His surgery was delayed due to initial glucose issues.  Since it is been over 17 years since his CABG revascularization, I recommended that he undergo a preoperative exercise Myoview study for preoperative assessment and clearance.  He underwent a nuclear perfusion study on May 05, 2018.  This was interpreted as low risk with normal perfusion and mildly reduced left ventricular global systolic function with an EF of 48%.  He developed horizontal ST segment depression of 1.5 mm during stress which  returned to baseline at 5 to 9 minutes into the recovery period.  When compared to his prior nuclear perfusion study of 2015 this was not significantly changed and on that study he also had normal perfusion with positive ECG changes.  A subsequent echo Doppler study on May 30, 2018 showed an EF of 55 to 60%.  There was severe LVH.  There was grade 1 diastolic dysfunction with mild MR and mild LA dilation.  He denies any chest pain.  He has had periods of increased cough following prolonged coughs he may feel somewhat atypical chest pain discomfort.  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 Lake Bells.  He had his pulmonary evaluation with Dr. 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  I saw him in March 2021.  Over the prior year he had remained stable from a cardiovascular standpoint.  He was not having anginal symptomatology 18-1/2 years following his CABG revascularization.  He developed Covid infection and was very weak and fatigued for 5 days.  In addition his son died in 2018-12-07 after he developed a seizure while driving a motorcycle and was around 72 years old.  In addition, his sister died at age 11 secondary to cancer.  He continues to be on amlodipine 5 mg, isosorbide 30 mg, lisinopril 40 mg and Bystolic 10 mg for hypertension.  He is on Zetia and rosuvastatin 10 mg for hyperlipidemia in addition to omega-3 fatty acids.  He is diabetic on Metformin, Farxiga, and Actos.    He was  evaluated by me in August 2021.  [name] has done  fairly well.  However, he admits to experiencing some episodes of chest pressure over the past 6 to 8 weeks.  Mostly this occurs without exertion and oftentimes occurs when he goes more in a reclining position when on his recliner chair.  However, he has noticed some vague symptomatology with walking.  However he walks his dogs regularly and often runs with them and denies any chest pressure when he is running with his dogs.  He does note some mild lightheadedness when he bends over and its up abruptly.  He is unaware of palpitations.  He underwent an echo Doppler study in February 05, 2020 which showed an EF of 55 to 60% and grade 2 diastolic dysfunction.  There was mild mitral gravitation, and mild aortic valve sclerosis.  The ascending aorta measured 38 mm.  Laboratory in April 2021 showed an LDL of 71 on Zetia and rosuvastatin.  He continues to  be on Metformin, Farxiga, and Actos for his diabetes mellitus.    He underwent a nuclear perfusion study for evaluation of his chest pain which was done on April 05, 2020.  This was low risk and showed an EF at 55%.  It was very mild ST depression at peak infusion without any definitive perfusion defect on nuclear imaging.  I saw him on August 15, 2020 at which time he felt well and denied any recurrent chest pain.  He continued  to be on amlodipine 5 mg, lisinopril 40 mg, isosorbide 30 mg, and Bystolic 5 mg for his hypertension and CAD.  He is diabetic on Farxiga, and glipizide in addition to Actos and recently he stopped taking Metformin.  He has felt improved off Metformin treatment.  He was on Zetia for hyperlipidemia.  He has not had recent laboratory.  During that evaluation, his blood pressure was elevated and I recommended slight titration of amlodipine from 5 mg up to 7.5 mg and recommended he continue his current regimen of isosorbide, lisinopril and nebivolol.  He was no longer on metformin but was on Farxiga in addition to glipizide and Actos for his  diabetes mellitus.  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  Recently, he has had recurrent problems with prostate infections and has been on antibiotics.  He recently has noticed some chest discomfort and shortness of breath.  He is unaware of any palpitations.  He apparently is no longer on isosorbide due to dizziness and is no longer on Zetia.  He had undergone laboratory on August 05, 2021 which showed total cholesterol 180, triglycerides 102, HDL 42 and LDL 117.  During that evaluation, I recommended titration of amlodipine to 10 mg and scheduled him for [facility] study for reassessment of his chest pain and potential ischemia.  I renewed his prescription for sublingual nitroglycerin and recommended reinstitution of Zetia.  I recommended follow-up laboratory.  He underwent a Lexiscan Myoview study on October 28, 2021 which showed EF at 44% with a small fixed defect in the basal inferior wall consistent with possible small prior infarction without associated ischemia.  I last saw him on Nov 21, 2021 at which time he continued to feel well and denied chest pain or shortness of breath.    His back is better and he is now walking his dog 2-3 times per day.  He cannot tolerate statin therapy.  Recent lipid studies from Nov 10, 2021 showed total cholesterol 152 triglycerides 73 HDL 43 and LDL 95, improved from 125 in February 2022.  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  During that evaluation, with his statin intolerance and elevated LP(a) I referred him to our pharmacist for initiation of Repatha which should result in a potential 26 to 30% reduction.  Since I last saw him, he developed chest pain leading to an emergency room evaluation and admission on January 10, 2022.  He was evaluated by Dr. Audie Box.  He underwent  cardiac catheterization 03/15/2022 by Dr. Alvester Chou.  And had patent grafts with a sequential LIMA to the diagonal and LAD and left  radial to diagonal branch.  The PDA and PLA were occluded distally but these were small vessels received collaterals from the distal circumflex.  Felt to have 75% mid AV groove circumflex stenosis and underwent successful DFR guided mediated.  Intervention with stenting with a 2.5 x 12 mm Synergy DES stent.  Subsequently, Mr.  Bennett has felt well.  He denies any recurrent chest pain.  He is walking 2-3 times a day taking his dog for walks.  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  Remotely he had worked in [employer].  He denies any significant shortness of breath.  He is now on DAPT with aspirin/Plavix, for hypertension and CAD on amlodipine 5 mg, lisinopril 40 mg and nebivolol 2.5 mg daily.  He is diabetic on Farxiga, glipizide, and Actos.  He has restless legs on gabapentin.  He presents for evaluation.  Past Medical History:  Diagnosis Date   CAD (coronary artery disease)    nuclear stress test-09/09/11 low risk scan EF61%, sees Dr. Claiborne Billings    Chronic headache    Chronic neck pain    Diabetes mellitus    sees Dr. Kelton Pillar   Hyperlipidemia    Hypertension 12/26/2008   ECHO- EF>55%   Insomnia     Past Surgical History:  Procedure Laterality Date   APPENDECTOMY     cervical spine injection     COLONOSCOPY  04-02-06    per [facility], clear, repeat in 10 yrs   CORONARY ARTERY BYPASS GRAFT  2002   4 vessels   CORONARY STENT INTERVENTION N/A 01/12/2022   Procedure: CORONARY STENT INTERVENTION;  Surgeon: Lorretta Harp, MD;  Location: [facility];  Service: Cardiovascular;  Laterality: N/A;   INTRAVASCULAR PRESSURE WIRE/FFR STUDY N/A 01/12/2022   Procedure: INTRAVASCULAR PRESSURE WIRE/FFR STUDY;  Surgeon: Lorretta Harp, MD;  Location: [facility];  Service: Cardiovascular;  Laterality: N/A;   LEFT HEART CATH AND CORS/GRAFTS ANGIOGRAPHY N/A 01/12/2022   Procedure: LEFT HEART CATH AND CORS/GRAFTS ANGIOGRAPHY;  Surgeon: Lorretta Harp, MD;   Location: [facility];  Service: Cardiovascular;  Laterality: N/A;    Allergies  Allergen Reactions   Codeine Anaphylaxis    Pt. verbalized that he has taken and can take Hydrocodone, Oxycodone w/o issue - codeine allergy only   Ambien [Zolpidem Tartrate] Other (See Comments)    Headache    Crestor [Rosuvastatin]     Myalgia    Isordil [Isosorbide Nitrate] Other (See Comments)    Headaches   Lipitor [Atorvastatin] Other (See Comments)    Myalgia    Zetia [Ezetimibe] Other (See Comments)    Myalgia    Cipro [Ciprofloxacin Hcl] Hives and Rash   Victoza [Liraglutide] Rash    Current Outpatient Medications  Medication Sig Dispense Refill   amLODipine (NORVASC) 5 MG tablet Take 5 mg by mouth daily.     aspirin 81 MG tablet Take 81 mg by mouth daily.     clonazePAM (KLONOPIN) 0.5 MG tablet Take 1 tablet (0.5 mg total) by mouth 2 (two) times daily as needed for anxiety.     clopidogrel (PLAVIX) 75 MG tablet Take 1 tablet (75 mg total) by mouth daily with breakfast. 90 tablet 3   Coenzyme Q10 (COQ10) 100 MG CAPS Take 100 mg by mouth daily.      dapagliflozin propanediol (FARXIGA) 10 MG TABS tablet Take 1 tablet (10 mg total) by mouth daily. 90 tablet 3   gabapentin (NEURONTIN) 300 MG capsule TAKE 3 CAPSULES BY MOUTH AT BEDTIME 270 capsule 1   glipiZIDE (GLUCOTROL) 5 MG tablet Take 1 tablet (5 mg total) by mouth 2 (two) times daily before a meal. 180 tablet 3   lisinopril (ZESTRIL) 40 MG tablet TAKE 1 TABLET BY MOUTH EVERY DAY 90 tablet 3  MELATONIN GUMMIES PO Take 2 each by mouth at bedtime.     nebivolol (BYSTOLIC) 2.5 MG tablet Take 1 tablet (2.5 mg total) by mouth daily. 30 tablet 6   nitroGLYCERIN (NITROSTAT) 0.4 MG SL tablet Place 1 tablet (0.4 mg total) under the tongue every 5 (five) minutes as needed for chest pain. 25 tablet 6   Omega-3 350 MG CPDR Take 350 mg by mouth daily.     oxyCODONE-acetaminophen (PERCOCET/ROXICET) 5-325 MG tablet Take 1 tablet by mouth every 8  (eight) hours as needed for severe pain. 10 tablet 0   pioglitazone (ACTOS) 45 MG tablet Take 1 tablet (45 mg total) by mouth daily. 90 tablet 3   sildenafil (VIAGRA) 100 MG tablet Take 1 tablet (100 mg total) by mouth daily as needed for erectile dysfunction. 10 tablet 11   No current facility-administered medications for this visit.    Social History   Socioeconomic History   Marital status: Married    Spouse name: Not on file   Number of children: Not on file   Years of education: Not on file   Highest education level: Not on file  Occupational History   Not on file  Tobacco Use   Smoking status: Former    Packs/day: 0.25    Years: 10.00    Total pack years: 2.50    Types: Cigarettes    Quit date: 11/26/1970    Years since quitting: 51.4   Smokeless tobacco: Never  Vaping Use   Vaping Use: Never used  Substance and Sexual Activity   Alcohol use: No    Alcohol/week: 0.0 standard drinks of alcohol   Drug use: No   Sexual activity: Not on file  Other Topics Concern   Not on file  Social History Narrative   Not on file   Social Determinants of Health   Financial Resource Strain: Low Risk  (10/13/2021)   Overall Financial Resource Strain (CARDIA)    Difficulty of Paying Living Expenses: Not hard at all  Food Insecurity: No Food Insecurity (10/13/2021)   Hunger Vital Sign    Worried About Running Out of Food in the Last Year: Never true    [place] in the Last Year: Never true  Transportation Needs: No Transportation Needs (10/13/2021)   PRAPARE - Hydrologist (Medical): No    Lack of Transportation (Non-Medical): No  Physical Activity: Sufficiently Active (10/13/2021)   Exercise Vital Sign    Days of Exercise per Week: 5 days    Minutes of Exercise per Session: 30 min  Stress: No Stress Concern Present (10/13/2021)   [place]    Feeling of Stress : Not at all   Social Connections: [place] (10/13/2021)   Social Connection and Isolation Panel [NHANES]    Frequency of Communication with Friends and Family: More than three times a week    Frequency of Social Gatherings with Friends and Family: More than three times a week    Attends Religious Services: More than 4 times per year    Active Member of [employer] or Organizations: Yes    Attends Archivist Meetings: More than 4 times per year    Marital Status: Married  Human resources officer Violence: Not At Risk (10/13/2021)   Humiliation, Afraid, Rape, and Kick questionnaire    Fear of Current or Ex-Partner: No    Emotionally Abused: No    Physically Abused: No  Sexually Abused: No    Family History  Problem Relation Age of Onset   Liver cancer Father    Cancer Brother    Cancer Brother    Cancer Brother    Coronary artery disease Other    Hypertension Other    Heart disease Mother    Colon cancer Neg Hx    Stomach cancer Neg Hx    Esophageal cancer Neg Hx    Socially he is married. He has 3 children 8 grandchildren and 4 great-grandchildren. There is no tobacco or alcohol use. He is now retired.   ROS General: Negative; No fevers, chills, or night sweats;  HEENT: Decrease in hearing ,No changes in vision  sinus congestion, difficulty swallowing Pulmonary: History of "black lung" Cardiovascular: See history of present illness GI: Negative; No nausea, vomiting, diarrhea, or abdominal pain GU: Negative; No dysuria, hematuria, or difficulty voiding Musculoskeletal: Negative; no myalgias, joint pain, or weakness Hematologic/Oncology: Negative; no easy bruising, bleeding Endocrine: Positive for diabetes mellitus Neuro: Positive for paresthesias in his legs. Skin: Negative; No rashes or skin lesions Psychiatric: Negative; No behavioral problems, depression Sleep: Positive for restless legs; No snoring, daytime sleepiness, hypersomnolence, bruxism, hypnogognic hallucinations, no  cataplexy Other comprehensive 14 point system review is negative.   PE BP 130/68   Pulse 78   Ht 5' 9.5" (1.765 m)   Wt 213 lb (96.6 kg)   SpO2 95%   BMI 31.00 kg/m   Repeat blood pressure by me was 130/68  Wt Readings from Last 3 Encounters:  04/02/22 213 lb (96.6 kg)  03/23/22 213 lb (96.6 kg)  02/12/22 211 lb 9.6 oz (96 kg)   General: Alert, oriented, no distress.  Skin: normal turgor, no rashes, warm and dry HEENT: Normocephalic, atraumatic. Pupils equal round and reactive to light; sclera anicteric; extraocular muscles intact; Fu Nose without nasal septal hypertrophy Mouth/Parynx benign; Mallinpatti scale Neck: No JVD, no carotid bruits; normal carotid upstroke Lungs: clear to ausculatation and percussion; no wheezing or rales Chest wall: without tenderness to palpitation Heart: PMI not displaced, RRR, s1 s2 normal, 1/6 systolic murmur, no diastolic murmur, no rubs, gallops, thrills, or heaves Abdomen: soft, nontender; no hepatosplenomehaly, BS+; abdominal aorta nontender and not dilated by palpation. Back: no CVA tenderness Pulses 2+ Musculoskeletal: full range of motion, normal strength, no joint deformities Extremities: no clubbing cyanosis or edema, Homan's sign negative  Neurologic: grossly nonfocal; Cranial nerves grossly wnl Psychologic: Normal mood and affect  I personally reviewed his ECG from January 14, 2022 which showed sinus rhythm at 78.  There are inferior Q waves with T wave inversion in 3 and aVF  Nov 21, 2021 ECG (independently read by me): Sinus bradycardia at 53,small Q III,no ectopy   October 20, 2021 ECG (independently read by me): Sinus bradycardia at 53; small Q III t wave abnormality III, aVF  February 2022 ECG (independently read by me): Sinus Bradycardia at 57; early transition  August 2021 ECG (independently read by me): Sinus bradycardia 57 bpm.  Early transition.  No ectopy.  No ST segment changes.  March 2021 ECG (independently read by  me): Sinus bradycardia 59 bpm.  Nonspecific ST abnormality.  Normal intervals.  No ectopy.  December 2019 ECG (independently read by me): Normal sinus rhythm at 61 bpm.  Early transition.  No ectopy.  No ST segment changes.  October 2019 ECG (independently read by me): Sinus bradycardia 53 bpm.  Early transition.  No ectopy.  March 2019 ECG (independently read by  me): Sinus bradycardia 59 bpm.  Early transition.  Normal intervals.  October 2018 ECG (independently read by me): Normal sinus rhythm at 60 bpm.  Nonspecific T wave abnormality.  Normal intervals.  No ectopy.  May 2018 ECG (independently read by me): Sinus bradycardia 59 bpm.  No significant ST-T changes.  Early transition.  April 2017 ECG (independently read by me): Sinus bradycardia at 53 bpm.  Q waves in III and F.  March 2016 ECG (independently read by me): Normal sinus rhythm at 67.  No ectopy.  November 2015 ECG (independently read by me): Normal sinus rhythm at 65 bpm.  Early transition.  No significant ST segment changes.  Normal intervals.  Prior ECG: Normal sinus rhythm at 58 beats per minute. QTc interval 428 ms. PR interval 170 ms. Nonspecific ST changes   LABS:     Latest Ref Rng & Units 01/13/2022    4:31 AM 01/12/2022    1:36 PM 01/11/2022   12:29 AM  BMP  Glucose 70 - 99 mg/dL 128  109  149   BUN 8 - 23 mg/dL 12  12  13    Creatinine 0.61 - 1.24 mg/dL 1.09  0.94  0.97   Sodium 135 - 145 mmol/L 139  142  140   Potassium 3.5 - 5.1 mmol/L 3.9  3.5  3.8   Chloride 98 - 111 mmol/L 109  110  106   CO2 22 - 32 mmol/L 23  21  23    Calcium 8.9 - 10.3 mg/dL 8.7  8.5  8.8        Latest Ref Rng & Units 11/10/2021    8:55 AM 08/05/2021    8:45 AM 08/15/2020    8:44 AM  Hepatic Function  Total Protein 6.0 - 8.5 g/dL 6.8  7.1  7.3   Albumin 3.7 - 4.7 g/dL 4.4  4.3  4.6   AST 0 - 40 IU/L 25  22  31    ALT 0 - 44 IU/L 21  18  29    Alk Phosphatase 44 - 121 IU/L 93  82  92   Total Bilirubin 0.0 - 1.2 mg/dL 0.2  0.4  0.2    Bilirubin, Direct 0.0 - 0.3 mg/dL  0.1         Latest Ref Rng & Units 01/13/2022    4:31 AM 01/12/2022    1:49 AM 01/11/2022   12:29 AM  CBC  WBC 4.0 - 10.5 K/uL 12.3  12.3  7.8   Hemoglobin 13.0 - 17.0 g/dL 11.0  11.7  11.5   Hematocrit 39.0 - 52.0 % 34.5  36.1  35.1   Platelets 150 - 400 K/uL 181  192  203    Lab Results  Component Value Date   MCV 83.1 01/13/2022   MCV 82.8 01/12/2022   MCV 81.4 01/11/2022    Lab Results  Component Value Date   TSH 2.702 01/10/2022   Lab Results  Component Value Date   HGBA1C 7.6 (A) 03/23/2022    BNP No results found for: "PROBNP"  Lipid Panel     Component Value Date/Time   CHOL 102 01/11/2022 0029   CHOL 152 11/10/2021 0855   CHOL 173 05/24/2013 1049   TRIG 57 01/11/2022 0029   TRIG 185 (H) 05/24/2013 1049   HDL 43 01/11/2022 0029   HDL 43 11/10/2021 0855   HDL 39 (L) 05/24/2013 1049   CHOLHDL 2.4 01/11/2022 0029   VLDL 11 01/11/2022 0029   LDLCALC 48  01/11/2022 0029   [place] 95 11/10/2021 0855   LDLCALC 84 11/22/2017 0846   LDLCALC 97 05/24/2013 1049     RADIOLOGY: No results found.  LEXISCAN MYOVIEW 04/05/2020 Study Highlights    The left ventricular ejection fraction is normal (55-65%). Nuclear stress EF: 55%. Horizontal ST segment depression ST segment depression was noted during stress. No T wave inversion was noted during stress. The study is normal. This is a low risk study.   Similar to prior nuclear stress tests, there were very mild st depressions at peak infusion, but there is no clear perfusion defect seen on imaging. Normal EF.   Lexiscan Myoview: October 27, 2021   Findings are consistent with prior myocardial infarction. The study is intermediate risk.   No ST deviation was noted.   LV perfusion is abnormal. Defect 1: There is a small defect with moderate reduction in uptake present in the basal inferior location(s) that is fixed. There is abnormal wall motion in the defect area. Consistent  with infarction. Rotating planar data shows a linear artifact across the diaphragm which may account for inferior wall perfusion artifact, however in the setting of abnormal wall motion, may represent true infarction.   Left ventricular function is abnormal. Nuclear stress EF: 44 %. The left ventricular ejection fraction is moderately decreased (30-44%). End diastolic cavity size is moderately enlarged.   Prior study available for comparison from 04/05/2020. There are changes compared to prior study which appear to be new. The left ventricular ejection fraction has decreased.    CARDIAC CATH/PCI: 01/12/2022   Ost Cx to Prox Cx lesion is 40% stenosed.   Mid Cx lesion is 75% stenosed.   3rd Mrg lesion is 50% stenosed.   Dist RCA lesion is 100% stenosed with 100% stenosed side branch in RPAV.   RPDA lesion is 100% stenosed.   Mid LAD lesion is 100% stenosed.   A drug-eluting stent was successfully placed.   Post intervention, there is a 0% residual stenosis.   IMPRESSION: Successful DFR guided mid AV groove circumflex PCI and drug-eluting stenting.  The sequential LIMA to the diagonal and LAD were patent as was the left radial to a diagonal branch.  The PDA and PLA were occluded distally but these appear to be small and received collaterals from the distal circumflex.  After reviewing the films with Dr. Tamala Julian our consensus opinion was to intervene on the circumflex if it was physiologically significant and to reserve PDA/PLA intervention if he has ongoing chest pain.  He will need DAPT uninterrupted for at least 12 months.  The sheath will be removed once ACT falls below and 70 and pressure held.  He will be hydrated overnight.  He left the lab in stable condition.    Intervention  IMPRESSION:  1. Coronary artery disease involving coronary bypass graft of native heart with angina pectoris ([place])   2. Hx of CABG   3. Essential hypertension   4. Hyperlipidemia with target LDL less than 55.   5.  Elevated Lp(a)   6. Type 2 diabetes mellitus without complication, without long-term current use of insulin (HCC)   7. 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 Roxan Hockey in November 2002.  Remotely he had noticed mild shortness of breath and fatigue and never experienced classic substernal chest pressure.  A nuclear perfusion study in 2015 continued to show normal perfusion but  he developed ST segment changes of 1-2 mm.  Amlodipine was added to his medical regimen and since institution he felt improved.   A 4-year follow-up nuclear stress test in 2019 in anticipation of potential future shoulder surgery was unchanged from previously and continued to show normal perfusion but again he developed asymptomatic ST depression which lasted 5 to 9 minutes into the recovery period for resolution.  He never had a shoulder surgery.  At his evaluation in August 2021 he was experiencing some episodes of chest discomfort.  He underwent follow-up nuclear imaging study on April 05, 2020 which  was unchanged from his prior evaluation and remain low risk.  His most recent [place] study from October 23, 2021 suggested a small basal inferior region of scar with EF slightly reduced compared to previously.  I reviewed his most recent hospitalization where he presented with chest pain.  Repeat catheterization revealed 75% stenosis in the AV groove circumflex with patent grafts and with previously noted distal see occlusion in the PDA and PL vessel.  He underwent DFR guided PCI to the mid AV groove circumflex with insertion of a 2.5 x 12 mm Synergy stent placed by Dr. Gwenlyn Found.  Presently, [name] is asymptomatic with reference to recurrent chest pain.  His blood pressure today is stable on amlodipine 5 mg, lisinopril 40 mg and nebivolol 2.5 mg.  He is on DAPT with aspirin/Plavix.  He is diabetic on Farxiga,  Actos and glipizide.  In the past he was found to have elevated LP(a) and I had suggested consideration for Repatha.  He may be a candidate for enrollment in the clinical trial since he has established CAD and has undergone coronary intervention.  He continues to be on gabapentin for restless legs.  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  He will return to the primary care of Dr. Sarajane Jews and will be following up with endocrinology with Dr. Kelton Pillar.  I will see him in 6 months for reevaluation.    Troy Sine, MD, [facility]  04/08/2022 11:52 AM

## 2022-04-02 NOTE — Patient Instructions (Signed)
Medication Instructions:  The earliest you may stop your Plavix is 6 months- please let us know if you need to do this.   *If you need a refill on your cardiac medications before your next appointment, please call your pharmacy*   Follow-Up: At [facility], you and your health needs are our priority.  As part of our continuing mission to provide you with exceptional heart care, we have created designated Provider Care Teams.  These Care Teams include your primary Cardiologist (physician) and Advanced Practice Providers (APPs -  Physician Assistants and Nurse Practitioners) who all work together to provide you with the care you need, when you need it.  We recommend signing up for the patient portal called "MyChart".  Sign up information is provided on this After Visit Summary.  MyChart is used to connect with patients for Virtual Visits (Telemedicine).  Patients are able to view lab/test results, encounter notes, upcoming appointments, etc.  Non-urgent messages can be sent to your provider as well.   To learn more about what you can do with MyChart, go to [email or web address].    Your next appointment:   6 month(s)  The format for your next appointment:   In Person  Provider:   Shelva Majestic, MD

## 2022-04-08 ENCOUNTER — Encounter: Payer: Self-pay | Admitting: Cardiovascular Disease

## 2022-04-25 ENCOUNTER — Other Ambulatory Visit: Payer: Self-pay | Admitting: Physician Assistant

## 2022-04-30 ENCOUNTER — Other Ambulatory Visit: Payer: Self-pay | Admitting: Family Medicine

## 2022-04-30 ENCOUNTER — Other Ambulatory Visit: Payer: Self-pay | Admitting: Cardiovascular Disease

## 2022-04-30 ENCOUNTER — Other Ambulatory Visit: Payer: Self-pay | Admitting: General Practice

## 2022-05-08 IMAGING — CT CT ABD-PELV W/ CM
2 of 5 series · 16 of 46 positions shown, 18 images · IV contrast (OMNIPAQUE 300)
Comparison: None.

CLINICAL DATA: Abdominal pain

EXAM:
CT ABDOMEN AND PELVIS WITH CONTRAST
TECHNIQUE: Multidetector CT imaging of the abdomen and pelvis was performed
using the standard protocol following bolus administration of
intravenous contrast.
CONTRAST:  80mL OMNIPAQUE IOHEXOL 350 MG/ML SOLN, additional oral
enteric contrast

[Series 2: abd/pel w · axial · 0.79mm/px · z∈[-617,-167]mm · 13 of 102 slices shown, 15 images]
[im 6/102  soft-tissue]
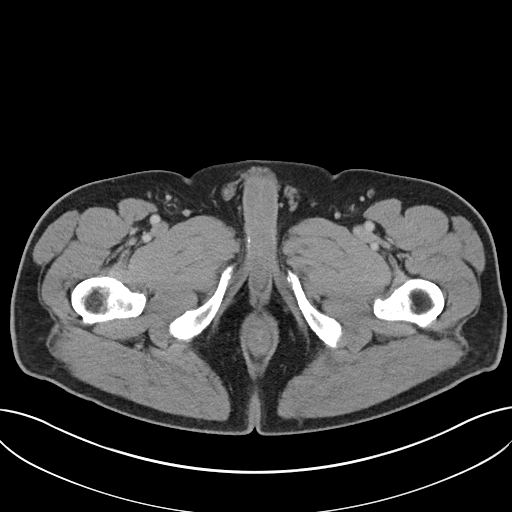
[im 6/102  bone]
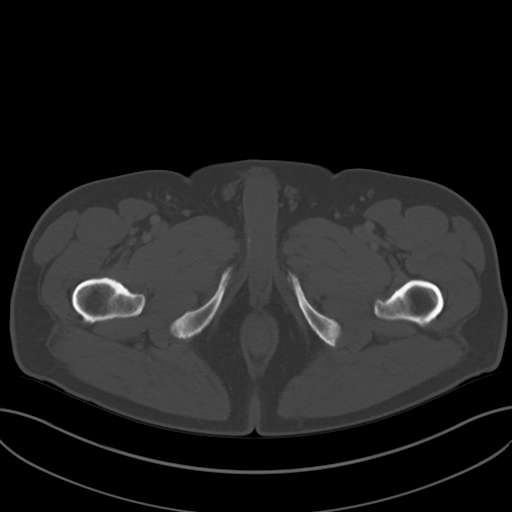
[im 12/102  soft-tissue]
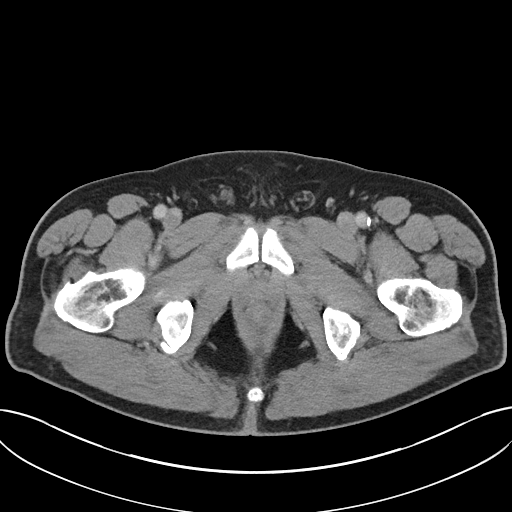
[im 23/102  soft-tissue]
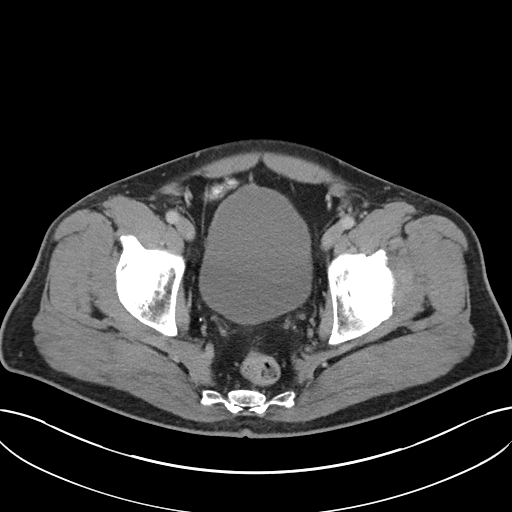
[im 29/102  soft-tissue]
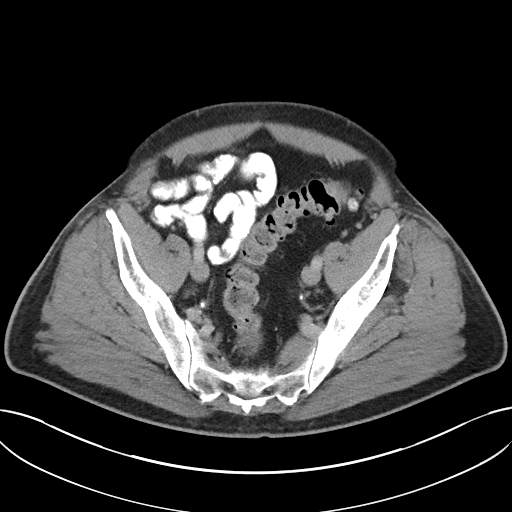
[im 34/102  soft-tissue]
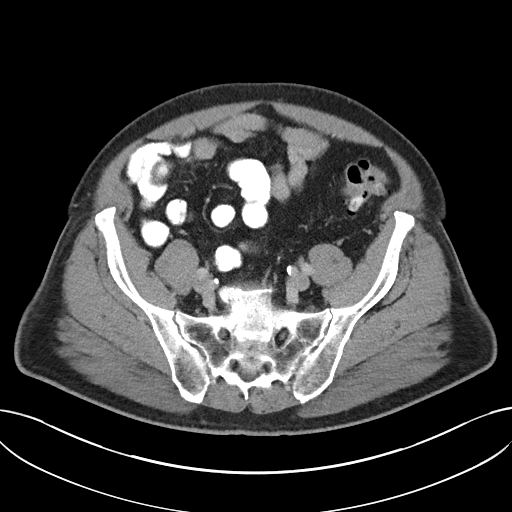
[im 45/102  soft-tissue]
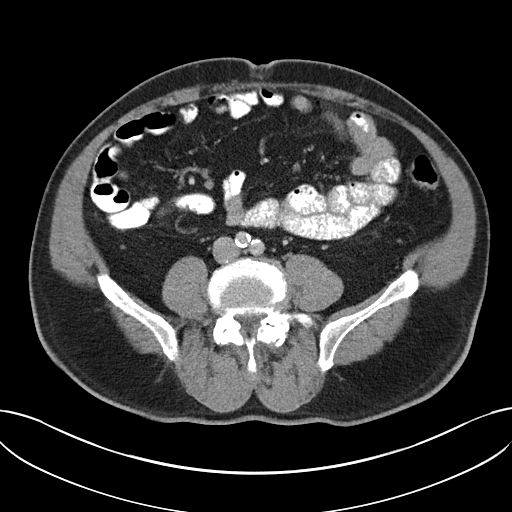
[im 51/102  soft-tissue]
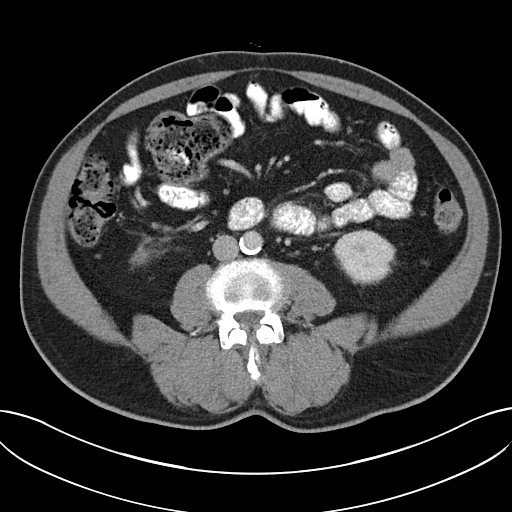
[im 57/102  soft-tissue]
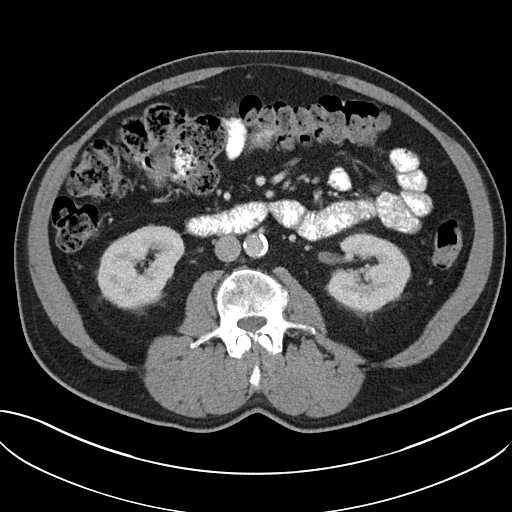
[im 68/102  soft-tissue]
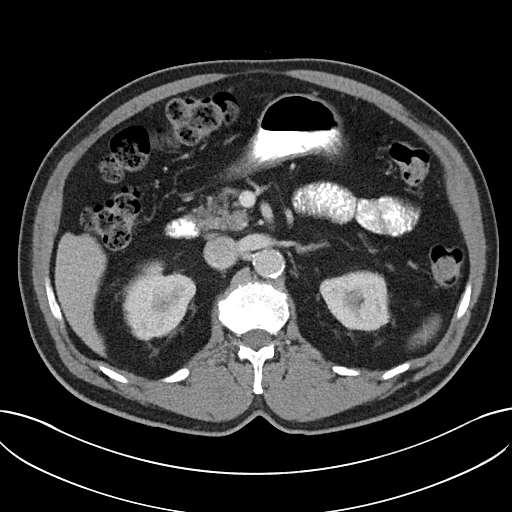
[im 68/102  bone]
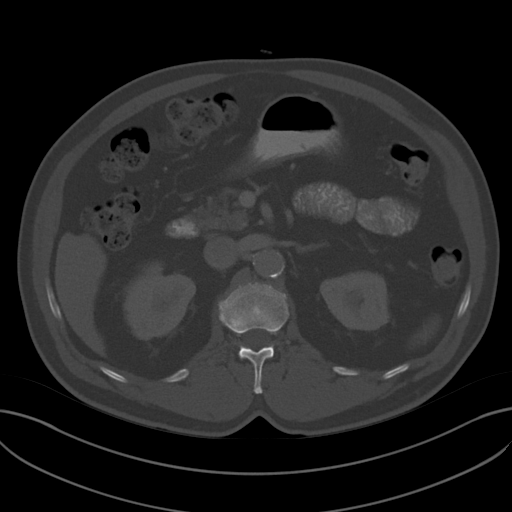
[im 73/102  soft-tissue]
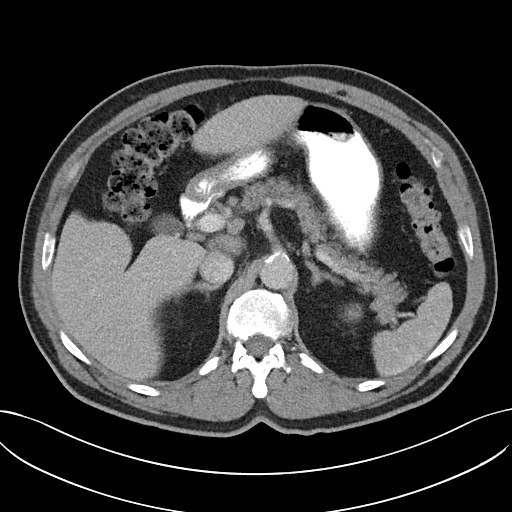
[im 79/102  soft-tissue]
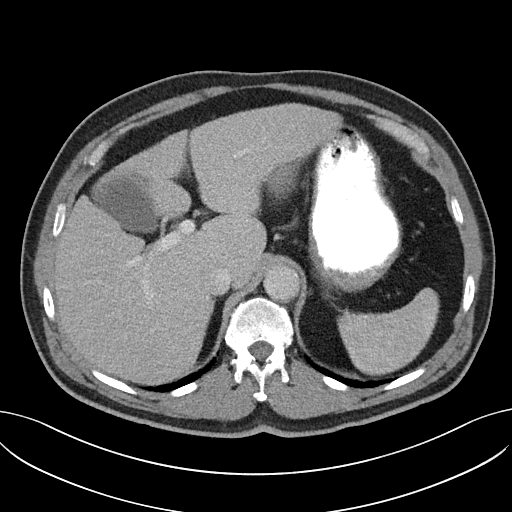
[im 90/102  soft-tissue]
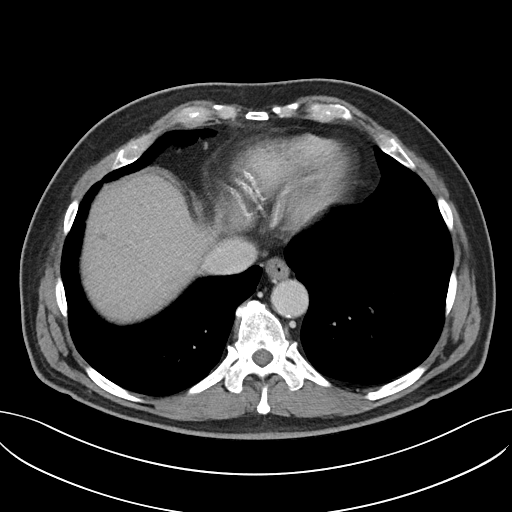
[im 96/102  soft-tissue]
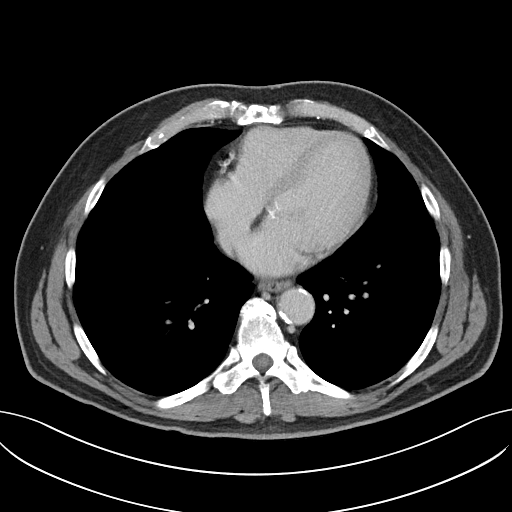

[Series 5: coronal st · coronal · 0.90mm/px · 3 of 106 slices shown]
[im 36/106  soft-tissue]
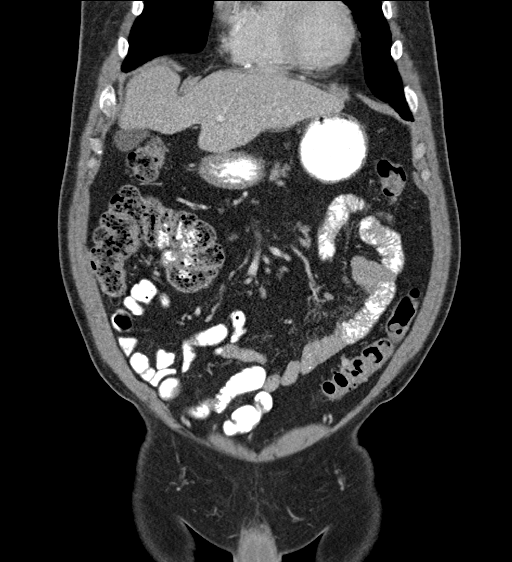
[im 47/106  soft-tissue]
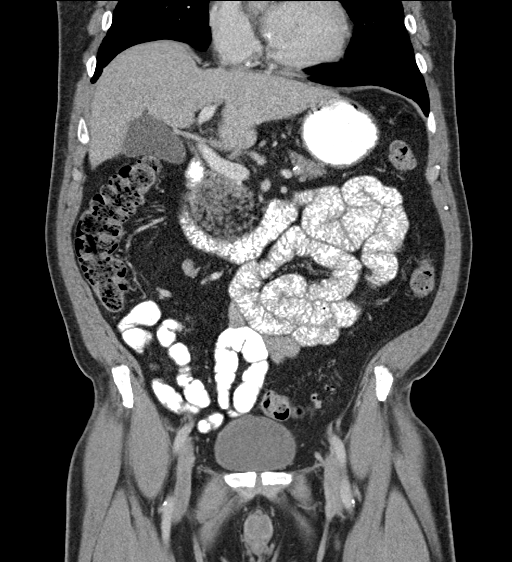
[im 59/106  soft-tissue]
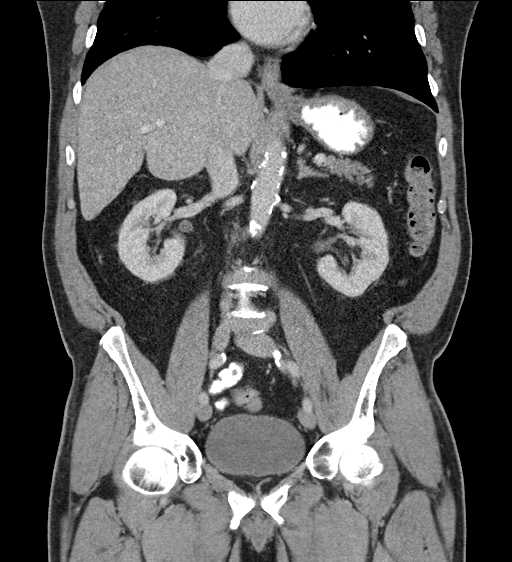

[16 of 46 positions shown; findings below may reference images not displayed]

FINDINGS: Lower chest: No acute abnormality.  Coronary artery calcifications.

Hepatobiliary: No solid liver abnormality is seen. Small gallstones
in the dependent gallbladder. Gallbladder wall thickening, or
biliary dilatation.

Pancreas: Unremarkable. No pancreatic ductal dilatation or
surrounding inflammatory changes.

Spleen: Normal in size without significant abnormality.

Adrenals/Urinary Tract: Adrenal glands are unremarkable. Kidneys are
normal, without renal calculi, solid lesion, or hydronephrosis.
Bladder is unremarkable.

Stomach/Bowel: Stomach is within normal limits. Appendix not clearly
visualized and may be surgically absent. No evidence of bowel wall
thickening, distention, or inflammatory changes. Sigmoid
diverticula.

Vascular/Lymphatic: Aortic atherosclerosis. No enlarged abdominal or
pelvic lymph nodes.

Reproductive: No mass or other significant abnormality.

Other: No abdominal wall hernia or abnormality. No abdominopelvic
ascites.

Musculoskeletal: No acute or significant osseous findings.
IMPRESSION: 1. No CT findings of the abdomen or pelvis to explain abdominal
pain.
2. Cholelithiasis without evidence of acute cholecystitis.
3. Sigmoid diverticulosis without evidence of acute diverticulitis.
4. Coronary artery disease.

Aortic Atherosclerosis ([id number]-Q98.8).

## 2022-05-09 ENCOUNTER — Other Ambulatory Visit: Payer: Self-pay | Admitting: Internal Medicine

## 2022-07-09 ENCOUNTER — Other Ambulatory Visit: Payer: Self-pay

## 2022-07-09 ENCOUNTER — Other Ambulatory Visit: Payer: Self-pay | Admitting: Family Medicine

## 2022-07-09 DIAGNOSIS — M5432 Sciatica, left side: Secondary | ICD-10-CM

## 2022-07-09 MED ORDER — GABAPENTIN 300 MG PO CAPS: 900.0000 mg | ORAL_CAPSULE | Freq: Every day | ORAL | 0 refills | Status: DC

## 2022-07-14 ENCOUNTER — Ambulatory Visit: Payer: Medicare Other | Admitting: Family Medicine

## 2022-07-17 ENCOUNTER — Ambulatory Visit: Payer: Medicare Other | Admitting: Family Medicine

## 2022-07-22 ENCOUNTER — Ambulatory Visit (INDEPENDENT_AMBULATORY_CARE_PROVIDER_SITE_OTHER): Payer: Medicare Other | Admitting: Family Medicine

## 2022-07-22 ENCOUNTER — Encounter: Payer: Self-pay | Admitting: Family Medicine

## 2022-07-22 VITALS — BP 118/60 | HR 61 | Temp 98.3°F | Wt 214.0 lb

## 2022-07-22 DIAGNOSIS — I2510 Atherosclerotic heart disease of native coronary artery without angina pectoris: Secondary | ICD-10-CM

## 2022-07-22 DIAGNOSIS — I10 Essential (primary) hypertension: Secondary | ICD-10-CM

## 2022-07-22 DIAGNOSIS — M5432 Sciatica, left side: Secondary | ICD-10-CM | POA: Diagnosis not present

## 2022-07-22 DIAGNOSIS — M25552 Pain in left hip: Secondary | ICD-10-CM

## 2022-07-22 DIAGNOSIS — M79605 Pain in left leg: Secondary | ICD-10-CM | POA: Diagnosis not present

## 2022-07-22 MED ORDER — CLONAZEPAM 0.5 MG PO TABS: 0.5000 mg | ORAL_TABLET | Freq: Two times a day (BID) | ORAL | 5 refills | Status: DC | PRN

## 2022-07-22 MED ORDER — GABAPENTIN 300 MG PO CAPS: 900.0000 mg | ORAL_CAPSULE | Freq: Two times a day (BID) | ORAL | 5 refills | Status: DC

## 2022-07-22 NOTE — Progress Notes (Signed)
   Subjective:    Patient ID: [name], male    DOB: [birth year], 72 y.o.   MRN: [id number]  HPI Here for several issues. First he says for the past 2 months he has had intermittent sharp pains in the left buttock and sometimes in the left lower leg. His back pain has been stable. He also gets some swelling in the left lower leg and foot occasionally. 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 He describes some SOB on exertion, but he denies any chest pain. He takes ASA and Plavix. He had an ECHO last July showing an EF of 60-65% and Grade I diastolic dysfunction. He had a cardiac cath last jusy that showed 3 vessel CAD and ond stent was placed.    Review of Systems  Constitutional: Negative.   HENT: Negative.    Eyes: Negative.   Respiratory:  Positive for shortness of breath. Negative for cough and wheezing.   Cardiovascular:  Positive for leg swelling. Negative for chest pain and palpitations.  Neurological:  Positive for numbness.       Objective:   Physical Exam Constitutional:      Appearance: Normal appearance. He is not ill-appearing.  Cardiovascular:     Rate and Rhythm: Normal rate and regular rhythm.     Pulses: Normal pulses.     Heart sounds: Normal heart sounds.  Pulmonary:     Effort: Pulmonary effort is normal.     Breath sounds: Normal breath sounds.  Musculoskeletal:     Right lower leg: No edema.     Left lower leg: No edema.     Comments: He is not tender in the lower back or in the left buttock or thigh or calf. No cords are felt. He feels pain in the posterior left thigh on SLR, but no back pain  Neurological:     Mental Status: He is alert.           Assessment & Plan:  He has had intermittent pain and swelling in the left leg, and we need to rule out any DVT. We will set up a venous doppler asap. The pain could also be from sciatica. We will increase the Gabapentin to 900 mg BID. We spent  a total of ( 34  ) minutes reviewing records and discussing these issues.   Alysia Penna, MD

## 2022-07-24 ENCOUNTER — Telehealth: Payer: Self-pay | Admitting: Family Medicine

## 2022-07-24 ENCOUNTER — Ambulatory Visit (HOSPITAL_COMMUNITY)
Admission: RE | Admit: 2022-07-24 | Discharge: 2022-07-24 | Disposition: A | Discharge status: Home / Self Care | Payer: Medicare Other | Source: Ambulatory Visit | Attending: Cardiology | Admitting: Cardiology

## 2022-07-24 DIAGNOSIS — K080 Exfoliation of teeth due to systemic causes: Secondary | ICD-10-CM | POA: Diagnosis not present

## 2022-07-24 DIAGNOSIS — M79605 Pain in left leg: Secondary | ICD-10-CM | POA: Insufficient documentation

## 2022-07-24 NOTE — Telephone Encounter (Signed)
Patient negative for DVT and Bakers Cyst in leg.

## 2022-07-27 NOTE — Telephone Encounter (Signed)
Noted  

## 2022-08-05 ENCOUNTER — Other Ambulatory Visit: Payer: Self-pay | Admitting: *Deleted

## 2022-08-05 DIAGNOSIS — Z951 Presence of aortocoronary bypass graft: Secondary | ICD-10-CM

## 2022-08-05 DIAGNOSIS — I10 Essential (primary) hypertension: Secondary | ICD-10-CM

## 2022-08-05 DIAGNOSIS — E119 Type 2 diabetes mellitus without complications: Secondary | ICD-10-CM

## 2022-08-05 DIAGNOSIS — I25709 Atherosclerosis of coronary artery bypass graft(s), unspecified, with unspecified angina pectoris: Secondary | ICD-10-CM

## 2022-08-05 DIAGNOSIS — E785 Hyperlipidemia, unspecified: Secondary | ICD-10-CM

## 2022-08-05 DIAGNOSIS — E7841 Elevated Lipoprotein(a): Secondary | ICD-10-CM

## 2022-08-10 ENCOUNTER — Other Ambulatory Visit: Payer: Self-pay | Admitting: Family Medicine

## 2022-08-10 DIAGNOSIS — E7841 Elevated Lipoprotein(a): Secondary | ICD-10-CM | POA: Diagnosis not present

## 2022-08-10 DIAGNOSIS — E119 Type 2 diabetes mellitus without complications: Secondary | ICD-10-CM | POA: Diagnosis not present

## 2022-08-10 DIAGNOSIS — I10 Essential (primary) hypertension: Secondary | ICD-10-CM | POA: Diagnosis not present

## 2022-08-10 DIAGNOSIS — I25709 Atherosclerosis of coronary artery bypass graft(s), unspecified, with unspecified angina pectoris: Secondary | ICD-10-CM | POA: Diagnosis not present

## 2022-08-10 DIAGNOSIS — Z951 Presence of aortocoronary bypass graft: Secondary | ICD-10-CM | POA: Diagnosis not present

## 2022-08-10 DIAGNOSIS — E785 Hyperlipidemia, unspecified: Secondary | ICD-10-CM | POA: Diagnosis not present

## 2022-08-10 DIAGNOSIS — K080 Exfoliation of teeth due to systemic causes: Secondary | ICD-10-CM | POA: Diagnosis not present

## 2022-08-10 NOTE — Telephone Encounter (Signed)
Tamsulosin 0.4mg  is currently discontinued Accu-Check test strips-not listed on Med list  Last OV-07/22/22 No future OV scheduled.

## 2022-08-11 LAB — COMPREHENSIVE METABOLIC PANEL
ALT: 20 IU/L (ref 0–44)
AST: 23 IU/L (ref 0–40)
Albumin/Globulin Ratio: 1.7 (ref 1.2–2.2)
Albumin: 4.4 g/dL (ref 3.8–4.8)
Alkaline Phosphatase: 96 IU/L (ref 44–121)
BUN/Creatinine Ratio: 15 (ref 10–24)
BUN: 15 mg/dL (ref 8–27)
Bilirubin Total: <0.2 mg/dL (ref 0.0–1.2)
CO2: 25 mmol/L (ref 20–29)
Calcium: 9.7 mg/dL (ref 8.6–10.2)
Chloride: 103 mmol/L (ref 96–106)
Creatinine, Ser: 1.01 mg/dL (ref 0.76–1.27)
Globulin, Total: 2.6 g/dL (ref 1.5–4.5)
Glucose: 163 mg/dL — ABNORMAL HIGH (ref 70–99)
Potassium: 4.5 mmol/L (ref 3.5–5.2)
Sodium: 139 mmol/L (ref 134–144)
Total Protein: 7 g/dL (ref 6.0–8.5)
eGFR: 79 mL/min/{1.73_m2} (ref >59)

## 2022-08-11 LAB — CBC
Hematocrit: 36.8 % — ABNORMAL LOW (ref 37.5–51.0)
Hemoglobin: 11.1 g/dL — ABNORMAL LOW (ref 13.0–17.7)
MCH: 24.4 pg — ABNORMAL LOW (ref 26.6–33.0)
MCHC: 30.2 g/dL — ABNORMAL LOW (ref 31.5–35.7)
MCV: 81 fL (ref 79–97)
Platelets: 282 10*3/uL (ref 150–450)
RBC: 4.55 x10E6/uL (ref 4.14–5.80)
RDW: 14.5 % (ref 11.6–15.4)
WBC: 4.9 10*3/uL (ref 3.4–10.8)

## 2022-08-11 LAB — LIPID PANEL
Chol/HDL Ratio: 3.7 ratio (ref 0.0–5.0)
Cholesterol, Total: 161 mg/dL (ref 100–199)
HDL: 44 mg/dL (ref >39)
LDL Chol Calc (NIH): 97 mg/dL (ref 0–99)
Triglycerides: 110 mg/dL (ref 0–149)
VLDL Cholesterol Cal: 20 mg/dL (ref 5–40)

## 2022-08-11 LAB — TSH: TSH: 2.55 u[IU]/mL (ref 0.450–4.500)

## 2022-08-12 ENCOUNTER — Ambulatory Visit: Payer: BC Managed Care – PPO | Attending: Cardiovascular Disease | Admitting: Cardiovascular Disease

## 2022-08-12 ENCOUNTER — Other Ambulatory Visit: Payer: Self-pay | Admitting: Cardiovascular Disease

## 2022-08-12 ENCOUNTER — Encounter: Payer: Self-pay | Admitting: Cardiovascular Disease

## 2022-08-12 DIAGNOSIS — E119 Type 2 diabetes mellitus without complications: Secondary | ICD-10-CM

## 2022-08-12 DIAGNOSIS — I25709 Atherosclerosis of coronary artery bypass graft(s), unspecified, with unspecified angina pectoris: Secondary | ICD-10-CM | POA: Diagnosis not present

## 2022-08-12 DIAGNOSIS — E7841 Elevated Lipoprotein(a): Secondary | ICD-10-CM

## 2022-08-12 DIAGNOSIS — E785 Hyperlipidemia, unspecified: Secondary | ICD-10-CM

## 2022-08-12 DIAGNOSIS — Z789 Other specified health status: Secondary | ICD-10-CM

## 2022-08-12 DIAGNOSIS — Z8616 Personal history of COVID-19: Secondary | ICD-10-CM

## 2022-08-12 DIAGNOSIS — Z951 Presence of aortocoronary bypass graft: Secondary | ICD-10-CM | POA: Diagnosis not present

## 2022-08-12 DIAGNOSIS — I10 Essential (primary) hypertension: Secondary | ICD-10-CM | POA: Diagnosis not present

## 2022-08-12 DIAGNOSIS — Z79899 Other long term (current) drug therapy: Secondary | ICD-10-CM

## 2022-08-12 MED ORDER — NITROGLYCERIN 0.4 MG SL SUBL: 0.4000 mg | SUBLINGUAL_TABLET | SUBLINGUAL | 6 refills | Status: DC | PRN

## 2022-08-12 MED ORDER — PRALUENT 150 MG/ML ~~LOC~~ SOAJ: 150.0000 mg | SUBCUTANEOUS | 11 refills | Status: DC

## 2022-08-12 NOTE — Patient Instructions (Addendum)
Medication Instructions:  Your physician has recommended you make the following change in your medication: START: Praluent 150 mg every 14 days *If you need a refill on your cardiac medications before your next appointment, please call your pharmacy*   Lab Work: Your physician recommends that you return for lab work in 3-4 months: CMET, Lipids If you have labs (blood work) drawn today and your tests are completely normal, you will receive your results only by: [place] (if you have MyChart) OR A paper copy in the mail If you have any lab test that is abnormal or we need to change your treatment, we will call you to review the results.   Testing/Procedures: None   Follow-Up: At [facility], you and your health needs are our priority.  As part of our continuing mission to provide you with exceptional heart care, we have created designated Provider Care Teams.  These Care Teams include your primary Cardiologist (physician) and Advanced Practice Providers (APPs -  Physician Assistants and Nurse Practitioners) who all work together to provide you with the care you need, when you need it.  We recommend signing up for the patient portal called "MyChart".  Sign up information is provided on this After Visit Summary.  MyChart is used to connect with patients for Virtual Visits (Telemedicine).  Patients are able to view lab/test results, encounter notes, upcoming appointments, etc.  Non-urgent messages can be sent to your provider as well.   To learn more about what you can do with MyChart, go to [email or web address].    Your next appointment:   6 month(s)  Provider:   Shelva Majestic, MD

## 2022-08-12 NOTE — Progress Notes (Signed)
Patient ID: [name], male   DOB: Mar 20, 1951, 72 y.o.   MRN: [id number]       Primary M.D.: Dr. Alysia Penna  HPI: [name] is a 72 y.o. male presents to the office today for a 5 month follow up cardiology evaluation.   [name] has CAD and in June 2002 underwent CABG surgery by Dr. Roxan Hockey with a sequential LIMA graft to the second diagonal and LAD, and sequential radial graft to the first and third diagonal branches. An echo Doppler study in 2010 showed normal systolic function with mild MR and trace TR.  A  nuclear perfusion study in March 2013 showed normal perfusion and function without scar or ischemia.  Additional problems include hypertension, type 2 diabetes mellitus, and hyperlipidemia. Remotely, he had worked as a Garment/textile technologist minor and in the past was some question of possible black lung. 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  In November 2015 he complained of a slight change in symptomatology with exertional shortness of breath and some arm weakness.  Prior to bypass surgery, he had never experienced chest pain but demonstrated exertional shortness of breath and arm discomfort.  He admitted to fatigue.  He denied any major episodes of chest pressure.  A 2-D echo Doppler study on 06/14/2014 showed an ejection fraction of 55-60% with mild focal basal hypertrophy of the septum; grade 1 diastolic dysfunction.  There was evidence for mild mitral regurgitation and moderate left atrial dilatation.  A nuclear perfusion study on December 11,2015 was interpreted as low risk.  He developed 1-2 mm of horizontal ST segment depression and had normal scintigraphic myocardial perfusion images.  Ejection fraction was 57%.  There was normal LV function without wall motion abnormality.  He has a history of hyperlipidemia and remotely self increased his simvastatin from 40 mg to 80 mg and developed occasional myalgias.   He has been on Bystolic 10 mg and lisinopril 30 mg for his hypertension.  He is on metformin 1000 mg twice a day as well as DiaBeta 5 mg for his type 2 diabetes mellitus.  When I saw him in October 2018, he had stopped taking Livalo for approximately one month.  He had been busy doing home projects and denied any recurrent chest pain.  During his last evaluation.  I had a lengthy discussion with him and recommended another attempt at a trial of Livalo.  I also added supplemental coenzyme Q 10.  Over this time period, he was able to reinitiate 2 mg daily and has been able to tolerate this without myalgias.  Subsequent blood work did show improvement in his lipid status on 08/04/2017 with a total cholesterol 149, LDL 82.  Triglycerides 148, and HDL 37.  He has continued to be on Januvia and metformin for his diabetes mellitus.  Hypertension.  He has remained on amlodipine 5 mg, lisinopril 30 mg, and Bystolic 10 mg.   When evaluated in March 2019 he was tolerating Livalo and I recommended further titration to 4 mg daily and attempt to obtain an LDL less than 70.  Since I saw him, he has had issues with poorly controlled diabetes mellitus.  Apparently he was started on farziga but developed some dizziness secondary to this.  He is now on metformin, Januvia, and was started on Actos.  Earlier this year he had a fall off a ladder.   I saw him in October 2019.  He required  rotator cuff surgery on his right shoulder.  His surgery was delayed due to initial glucose issues.  Since it is been over 17 years since his CABG revascularization, I recommended that he undergo a preoperative exercise Myoview study for preoperative assessment and clearance.  He underwent a nuclear perfusion study on May 05, 2018.  This was interpreted as low risk with normal perfusion and mildly reduced left ventricular global systolic function with an EF of 48%.  He developed horizontal ST segment depression of 1.5 mm during stress which  returned to baseline at 5 to 9 minutes into the recovery period.  When compared to his prior nuclear perfusion study of 2015 this was not significantly changed and on that study he also had normal perfusion with positive ECG changes.  A subsequent echo Doppler study on May 30, 2018 showed an EF of 55 to 60%.  There was severe LVH.  There was grade 1 diastolic dysfunction with mild MR and mild LA dilation.  He denies any chest pain.  He has had periods of increased cough following prolonged coughs he may feel somewhat atypical chest pain discomfort.  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 Lake Bells.  He had his pulmonary evaluation with Dr. 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  I saw him in March 2021.  Over the prior year he had remained stable from a cardiovascular standpoint.  He was not having anginal symptomatology 18-1/2 years following his CABG revascularization.  He developed Covid infection and was very weak and fatigued for 5 days.  In addition his son died in 2018-12-07 after he developed a seizure while driving a motorcycle and was around 73 years old.  In addition, his sister died at age 11 secondary to cancer.  He continues to be on amlodipine 5 mg, isosorbide 30 mg, lisinopril 40 mg and Bystolic 10 mg for hypertension.  He is on Zetia and rosuvastatin 10 mg for hyperlipidemia in addition to omega-3 fatty acids.  He is diabetic on Metformin, Farxiga, and Actos.    He was  evaluated by me in August 2021.  Mr. Iqbal has done  fairly well.  However, he admits to experiencing some episodes of chest pressure over the past 6 to 8 weeks.  Mostly this occurs without exertion and oftentimes occurs when he goes more in a reclining position when on his recliner chair.  However, he has noticed some vague symptomatology with walking.  However he walks his dogs regularly and often runs with them and denies any chest pressure when he is running with his dogs.  He does note some mild lightheadedness when he bends over and its up abruptly.  He is unaware of palpitations.  He underwent an echo Doppler study in February 05, 2020 which showed an EF of 55 to 60% and grade 2 diastolic dysfunction.  There was mild mitral gravitation, and mild aortic valve sclerosis.  The ascending aorta measured 38 mm.  Laboratory in April 2021 showed an LDL of 71 on Zetia and rosuvastatin.  He continues to  be on Metformin, Farxiga, and Actos for his diabetes mellitus.    He underwent a nuclear perfusion study for evaluation of his chest pain which was done on April 05, 2020.  This was low risk and showed an EF at 55%.  It was very mild ST depression at peak infusion without any definitive perfusion defect on nuclear imaging.  I saw him on August 15, 2020 at which time he felt well and denied any recurrent chest pain.  He continued  to be on amlodipine 5 mg, lisinopril 40 mg, isosorbide 30 mg, and Bystolic 5 mg for his hypertension and CAD.  He is diabetic on Farxiga, and glipizide in addition to Actos and recently he stopped taking Metformin.  He has felt improved off Metformin treatment.  He was on Zetia for hyperlipidemia.  He has not had recent laboratory.  During that evaluation, his blood pressure was elevated and I recommended slight titration of amlodipine from 5 mg up to 7.5 mg and recommended he continue his current regimen of isosorbide, lisinopril and nebivolol.  He was no longer on metformin but was on Farxiga in addition to glipizide and Actos for his  diabetes mellitus.  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  Recently, he has had recurrent problems with prostate infections and has been on antibiotics.  He recently has noticed some chest discomfort and shortness of breath.  He is unaware of any palpitations.  He apparently is no longer on isosorbide due to dizziness and is no longer on Zetia.  He had undergone laboratory on August 05, 2021 which showed total cholesterol 180, triglycerides 102, HDL 42 and LDL 117.  During that evaluation, I recommended titration of amlodipine to 10 mg and scheduled him for [facility] study for reassessment of his chest pain and potential ischemia.  I renewed his prescription for sublingual nitroglycerin and recommended reinstitution of Zetia.  I recommended follow-up laboratory.  He underwent a Lexiscan Myoview study on October 28, 2021 which showed EF at 44% with a small fixed defect in the basal inferior wall consistent with possible small prior infarction without associated ischemia.  I last saw him on Nov 21, 2021 at which time he continued to feel well and denied chest pain or shortness of breath.    His back is better and he is now walking his dog 2-3 times per day.  He cannot tolerate statin therapy.  Recent lipid studies from Nov 10, 2021 showed total cholesterol 152 triglycerides 73 HDL 43 and LDL 95, improved from 125 in February 2022.  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  During that evaluation, with his statin intolerance and elevated LP(a) I referred him to our pharmacist for initiation of Repatha which should result in a potential 26 to 30% reduction.  Since I last saw him, he developed chest pain leading to an emergency room evaluation and admission on January 10, 2022.  He was evaluated by Dr. Audie Box.  He underwent  cardiac catheterization 03/15/2022 by Dr. Alvester Chou.  And had patent grafts with a sequential LIMA to the diagonal and LAD and left  radial to diagonal branch.  The PDA and PLA were occluded distally but these were small vessels received collaterals from the distal circumflex.  Felt to have 75% mid AV groove circumflex stenosis and underwent successful DFR guided mediated.  Intervention with stenting with a 2.5 x 12 mm Synergy DES stent.  Subsequently,  Mr. Aris Lot has felt well.  He denies any recurrent chest pain.  He is walking 2-3 times a day taking his dog for walks.  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  Remotely he had worked in [employer].  He denies any significant shortness of breath.  He is now on DAPT with aspirin/Plavix, for hypertension and CAD on amlodipine 5 mg, lisinopril 40 mg and nebivolol 2.5 mg daily.  He is diabetic on Farxiga, glipizide, and Actos.  He has restless legs on gabapentin.  During that evaluation, I recommended institution of PCSK9 inhibition particularly since in the past he was remotely found to have an elevated LP(a).  Since I last saw him, apparently he had been started on Praluent 75 mg by pharmacy.  He also was no longer taking Zetia and statins due to leg discomfort.  His wife also had been started on treatment as well.  At the end of the year, she had developed some issues and as result her PCSK9 in addition was discontinued.  Apparently for some reason Mr. Imbriano was discontinued as well.  Presently, he denies any chest pain or shortness of breath.  He continues to see Dr. Annie Main 5 for primary care.  He continues to be on amlodipine 5 mg, lisinopril 40 mg, and nebivolol 2.5 mg daily for hypertension.  He is on Farxiga, glipizide and Actos for his diabetes mellitus.  He continues to be on DAPT with aspirin/Plavix.  He presents for follow-up evaluation.  Past Medical History:  Diagnosis Date   CAD (coronary artery disease)    nuclear stress test-09/09/11 low risk scan EF61%, sees Dr. Claiborne Billings    Chronic headache    Chronic neck pain    Diabetes mellitus    sees  Dr. Kelton Pillar   Hyperlipidemia    Hypertension 12/26/2008   ECHO- EF>55%   Insomnia     Past Surgical History:  Procedure Laterality Date   APPENDECTOMY     cervical spine injection     COLONOSCOPY  04-02-06    per [facility], clear, repeat in 10 yrs   CORONARY ARTERY BYPASS GRAFT  2002   4 vessels   CORONARY STENT INTERVENTION N/A 01/12/2022   Procedure: CORONARY STENT INTERVENTION;  Surgeon: Lorretta Harp, MD;  Location: [facility];  Service: Cardiovascular;  Laterality: N/A;   INTRAVASCULAR PRESSURE WIRE/FFR STUDY N/A 01/12/2022   Procedure: INTRAVASCULAR PRESSURE WIRE/FFR STUDY;  Surgeon: Lorretta Harp, MD;  Location: [facility];  Service: Cardiovascular;  Laterality: N/A;   LEFT HEART CATH AND CORS/GRAFTS ANGIOGRAPHY N/A 01/12/2022   Procedure: LEFT HEART CATH AND CORS/GRAFTS ANGIOGRAPHY;  Surgeon: Lorretta Harp, MD;  Location: [facility];  Service: Cardiovascular;  Laterality: N/A;    Allergies  Allergen Reactions   Codeine Anaphylaxis    Pt. verbalized that he has taken and can take Hydrocodone, Oxycodone w/o issue - codeine allergy only   Ambien [Zolpidem Tartrate] Other (See Comments)    Headache    Crestor [Rosuvastatin]     Myalgia    Isordil [Isosorbide Nitrate] Other (See Comments)    Headaches   Lipitor [Atorvastatin] Other (See Comments)    Myalgia    Zetia [Ezetimibe] Other (See Comments)    Myalgia    Cipro [Ciprofloxacin Hcl] Hives and Rash   Victoza [Liraglutide] Rash    Current Outpatient Medications  Medication Sig Dispense Refill   ACCU-CHEK GUIDE test strip USE TO TEST BLOOD SUGAR  EVERY DAY 100 strip 3   Alirocumab (PRALUENT) 150 MG/ML SOAJ Inject 150 mg into the skin every 14 (fourteen) days. 2 mL 11   amLODipine (NORVASC) 5 MG tablet Take 5 mg by mouth daily.     aspirin 81 MG tablet Take 81 mg by mouth daily.     clonazePAM (KLONOPIN) 0.5 MG tablet Take 1 tablet (0.5 mg total) by mouth 2 (two) times daily as  needed for anxiety. 60 tablet 5   clopidogrel (PLAVIX) 75 MG tablet Take 1 tablet (75 mg total) by mouth daily with breakfast. 90 tablet 3   Coenzyme Q10 (COQ10) 100 MG CAPS Take 100 mg by mouth daily.      dapagliflozin propanediol (FARXIGA) 10 MG TABS tablet Take 1 tablet (10 mg total) by mouth daily. 90 tablet 3   gabapentin (NEURONTIN) 300 MG capsule Take 3 capsules (900 mg total) by mouth 2 (two) times daily. 180 capsule 5   glipiZIDE (GLUCOTROL) 5 MG tablet TAKE 1 TABLET BY MOUTH EVERY DAY BEFORE BREAKFAST 90 tablet 1   lisinopril (ZESTRIL) 40 MG tablet TAKE 1 TABLET BY MOUTH EVERY DAY 90 tablet 3   MELATONIN GUMMIES PO Take 2 each by mouth at bedtime.     nebivolol (BYSTOLIC) 2.5 MG tablet TAKE 1 TABLET BY MOUTH EVERY DAY 90 tablet 3   Omega-3 350 MG CPDR Take 350 mg by mouth daily.     oxyCODONE-acetaminophen (PERCOCET/ROXICET) 5-325 MG tablet Take 1 tablet by mouth every 8 (eight) hours as needed for severe pain. 10 tablet 0   pioglitazone (ACTOS) 45 MG tablet Take 1 tablet (45 mg total) by mouth daily. 90 tablet 3   sildenafil (VIAGRA) 100 MG tablet Take 1 tablet (100 mg total) by mouth daily as needed for erectile dysfunction. 10 tablet 11   nitroGLYCERIN (NITROSTAT) 0.4 MG SL tablet Place 1 tablet (0.4 mg total) under the tongue every 5 (five) minutes as needed for chest pain. 25 tablet 6   No current facility-administered medications for this visit.    Social History   Socioeconomic History   Marital status: Married    Spouse name: Not on file   Number of children: Not on file   Years of education: Not on file   Highest education level: Not on file  Occupational History   Not on file  Tobacco Use   Smoking status: Former    Packs/day: 0.25    Years: 10.00    Total pack years: 2.50    Types: Cigarettes    Quit date: 11/26/1970    Years since quitting: 51.7   Smokeless tobacco: Never  Vaping Use   Vaping Use: Never used  Substance and Sexual Activity   Alcohol use:  No    Alcohol/week: 0.0 standard drinks of alcohol   Drug use: No   Sexual activity: Not on file  Other Topics Concern   Not on file  Social History Narrative   Not on file   Social Determinants of Health   Financial Resource Strain: Low Risk  (10/13/2021)   Overall Financial Resource Strain (CARDIA)    Difficulty of Paying Living Expenses: Not hard at all  Food Insecurity: No Food Insecurity (10/13/2021)   Hunger Vital Sign    Worried About Running Out of Food in the Last Year: Never true    Ran Out of Food in the Last Year: Never true  Transportation Needs: No Transportation Needs (10/13/2021)   PRAPARE - Transportation    Lack of  Transportation (Medical): No    Lack of Transportation (Non-Medical): No  Physical Activity: Sufficiently Active (10/13/2021)   Exercise Vital Sign    Days of Exercise per Week: 5 days    Minutes of Exercise per Session: 30 min  Stress: No Stress Concern Present (10/13/2021)   [place]    Feeling of Stress : Not at all  Social Connections: [place] (10/13/2021)   Social Connection and Isolation Panel [NHANES]    Frequency of Communication with Friends and Family: More than three times a week    Frequency of Social Gatherings with Friends and Family: More than three times a week    Attends Religious Services: More than 4 times per year    Active Member of [employer] or Organizations: Yes    Attends Music therapist: More than 4 times per year    Marital Status: Married  Human resources officer Violence: Not At Risk (10/13/2021)   Humiliation, Afraid, Rape, and Kick questionnaire    Fear of Current or Ex-Partner: No    Emotionally Abused: No    Physically Abused: No    Sexually Abused: No    Family History  Problem Relation Age of Onset   Liver cancer Father    Cancer Brother    Cancer Brother    Cancer Brother    Coronary artery disease Other    Hypertension Other     Heart disease Mother    Colon cancer Neg Hx    Stomach cancer Neg Hx    Esophageal cancer Neg Hx    Socially he is married. He has 3 children 8 grandchildren and 4 great-grandchildren. There is no tobacco or alcohol use. He is now retired.   ROS General: Negative; No fevers, chills, or night sweats;  HEENT: Decrease in hearing ,No changes in vision  sinus congestion, difficulty swallowing Pulmonary: History of "black lung" Cardiovascular: See history of present illness GI: Negative; No nausea, vomiting, diarrhea, or abdominal pain GU: Negative; No dysuria, hematuria, or difficulty voiding Musculoskeletal: Negative; no myalgias, joint pain, or weakness Hematologic/Oncology: Negative; no easy bruising, bleeding Endocrine: Positive for diabetes mellitus Neuro: Positive for paresthesias in his legs. Skin: Negative; No rashes or skin lesions Psychiatric: Negative; No behavioral problems, depression Sleep: Positive for restless legs; No snoring, daytime sleepiness, hypersomnolence, bruxism, hypnogognic hallucinations, no cataplexy Other comprehensive 14 point system review is negative.   PE BP (!) 142/66   Pulse (!) 56   Ht 5' 9.5" (1.765 m)   Wt 216 lb 9.6 oz (98.2 kg)   BMI 31.53 kg/m   Repeat blood pressure by me was 124/70  Wt Readings from Last 3 Encounters:  08/12/22 216 lb 9.6 oz (98.2 kg)  07/22/22 214 lb (97.1 kg)  04/02/22 213 lb (96.6 kg)    General: Alert, oriented, no distress.  Skin: normal turgor, no rashes, warm and dry HEENT: Normocephalic, atraumatic. Pupils equal round and reactive to light; sclera anicteric; extraocular muscles intact;  Nose without nasal septal hypertrophy Mouth/Parynx benign; Mallinpatti scale 3 Neck: No JVD, no carotid bruits; normal carotid upstroke Lungs: clear to ausculatation and percussion; no wheezing or rales Chest wall: without tenderness to palpitation Heart: PMI not displaced, RRR, s1 s2 normal, 1/6 systolic murmur, no  diastolic murmur, no rubs, gallops, thrills, or heaves Abdomen: soft, nontender; no hepatosplenomehaly, BS+; abdominal aorta nontender and not dilated by palpation. Back: no CVA tenderness Pulses 2+ Musculoskeletal: full range of motion, normal strength, no  joint deformities Extremities: no clubbing cyanosis or edema, Homan's sign negative  Neurologic: grossly nonfocal; Cranial nerves grossly wnl Psychologic: Normal mood and affect    August 12, 2022 ECG (independently read by me): Sinus bradycardia at 56 bpm.  No ectopy.  Normal intervals  I personally reviewed his ECG from January 14, 2022 which showed sinus rhythm at 78.  There are inferior Q waves with T wave inversion in 3 and aVF  Nov 21, 2021 ECG (independently read by me): Sinus bradycardia at 53,small Q III,no ectopy   October 20, 2021 ECG (independently read by me): Sinus bradycardia at 53; small Q III t wave abnormality III, aVF  February 2022 ECG (independently read by me): Sinus Bradycardia at 57; early transition  August 2021 ECG (independently read by me): Sinus bradycardia 57 bpm.  Early transition.  No ectopy.  No ST segment changes.  March 2021 ECG (independently read by me): Sinus bradycardia 59 bpm.  Nonspecific ST abnormality.  Normal intervals.  No ectopy.  December 2019 ECG (independently read by me): Normal sinus rhythm at 61 bpm.  Early transition.  No ectopy.  No ST segment changes.  October 2019 ECG (independently read by me): Sinus bradycardia 53 bpm.  Early transition.  No ectopy.  March 2019 ECG (independently read by me): Sinus bradycardia 59 bpm.  Early transition.  Normal intervals.  October 2018 ECG (independently read by me): Normal sinus rhythm at 60 bpm.  Nonspecific T wave abnormality.  Normal intervals.  No ectopy.  May 2018 ECG (independently read by me): Sinus bradycardia 59 bpm.  No significant ST-T changes.  Early transition.  April 2017 ECG (independently read by me): Sinus bradycardia at 53  bpm.  Q waves in III and F.  March 2016 ECG (independently read by me): Normal sinus rhythm at 67.  No ectopy.  November 2015 ECG (independently read by me): Normal sinus rhythm at 65 bpm.  Early transition.  No significant ST segment changes.  Normal intervals.  Prior ECG: Normal sinus rhythm at 58 beats per minute. QTc interval 428 ms. PR interval 170 ms. Nonspecific ST changes   LABS:     Latest Ref Rng & Units 08/10/2022    8:46 AM 01/13/2022    4:31 AM 01/12/2022    1:36 PM  BMP  Glucose 70 - 99 mg/dL 163  128  109   BUN 8 - 27 mg/dL 15  12  12   $ Creatinine 0.76 - 1.27 mg/dL 1.01  1.09  0.94   BUN/Creat Ratio 10 - 24 15     Sodium 134 - 144 mmol/L 139  139  142   Potassium 3.5 - 5.2 mmol/L 4.5  3.9  3.5   Chloride 96 - 106 mmol/L 103  109  110   CO2 20 - 29 mmol/L 25  23  21   $ Calcium 8.6 - 10.2 mg/dL 9.7  8.7  8.5        Latest Ref Rng & Units 08/10/2022    8:46 AM 11/10/2021    8:55 AM 08/05/2021    8:45 AM  Hepatic Function  Total Protein 6.0 - 8.5 g/dL 7.0  6.8  7.1   Albumin 3.8 - 4.8 g/dL 4.4  4.4  4.3   AST 0 - 40 IU/L 23  25  22   $ ALT 0 - 44 IU/L 20  21  18   $ Alk Phosphatase 44 - 121 IU/L 96  93  82   Total Bilirubin 0.0 -  1.2 mg/dL <0.2  0.2  0.4   Bilirubin, Direct 0.0 - 0.3 mg/dL   0.1        Latest Ref Rng & Units 08/10/2022    8:46 AM 01/13/2022    4:31 AM 01/12/2022    1:49 AM  CBC  WBC 3.4 - 10.8 x10E3/uL 4.9  12.3  12.3   Hemoglobin 13.0 - 17.7 g/dL 11.1  11.0  11.7   Hematocrit 37.5 - 51.0 % 36.8  34.5  36.1   Platelets 150 - 450 x10E3/uL 282  181  192    Lab Results  Component Value Date   MCV 81 08/10/2022   MCV 83.1 01/13/2022   MCV 82.8 01/12/2022    Lab Results  Component Value Date   TSH 2.550 08/10/2022   Lab Results  Component Value Date   HGBA1C 7.6 (A) 03/23/2022    BNP No results found for: "PROBNP"  Lipid Panel     Component Value Date/Time   CHOL 161 08/10/2022 0846   CHOL 173 05/24/2013 1049   TRIG 110 08/10/2022 0846    TRIG 185 (H) 05/24/2013 1049   HDL 44 08/10/2022 0846   HDL 39 (L) 05/24/2013 1049   CHOLHDL 3.7 08/10/2022 0846   CHOLHDL 2.4 01/11/2022 0029   VLDL 11 01/11/2022 0029   LDLCALC 97 08/10/2022 0846   LDLCALC 84 11/22/2017 0846   LDLCALC 97 05/24/2013 1049     RADIOLOGY: No results found.  LEXISCAN MYOVIEW 04/05/2020 Study Highlights    The left ventricular ejection fraction is normal (55-65%). Nuclear stress EF: 55%. Horizontal ST segment depression ST segment depression was noted during stress. No T wave inversion was noted during stress. The study is normal. This is a low risk study.   Similar to prior nuclear stress tests, there were very mild st depressions at peak infusion, but there is no clear perfusion defect seen on imaging. Normal EF.   Lexiscan Myoview: October 27, 2021   Findings are consistent with prior myocardial infarction. The study is intermediate risk.   No ST deviation was noted.   LV perfusion is abnormal. Defect 1: There is a small defect with moderate reduction in uptake present in the basal inferior location(s) that is fixed. There is abnormal wall motion in the defect area. Consistent with infarction. Rotating planar data shows a linear artifact across the diaphragm which may account for inferior wall perfusion artifact, however in the setting of abnormal wall motion, may represent true infarction.   Left ventricular function is abnormal. Nuclear stress EF: 44 %. The left ventricular ejection fraction is moderately decreased (30-44%). End diastolic cavity size is moderately enlarged.   Prior study available for comparison from 04/05/2020. There are changes compared to prior study which appear to be new. The left ventricular ejection fraction has decreased.    CARDIAC CATH/PCI: 01/12/2022   Ost Cx to Prox Cx lesion is 40% stenosed.   Mid Cx lesion is 75% stenosed.   3rd Mrg lesion is 50% stenosed.   Dist RCA lesion is 100% stenosed with 100% stenosed  side branch in RPAV.   RPDA lesion is 100% stenosed.   Mid LAD lesion is 100% stenosed.   A drug-eluting stent was successfully placed.   Post intervention, there is a 0% residual stenosis.   IMPRESSION: Successful DFR guided mid AV groove circumflex PCI and drug-eluting stenting.  The sequential LIMA to the diagonal and LAD were patent as was the left radial to a diagonal branch.  The PDA and PLA were occluded distally but these appear to be small and received collaterals from the distal circumflex.  After reviewing the films with Dr. Tamala Julian our consensus opinion was to intervene on the circumflex if it was physiologically significant and to reserve PDA/PLA intervention if he has ongoing chest pain.  He will need DAPT uninterrupted for at least 12 months.  The sheath will be removed once ACT falls below and 70 and pressure held.  He will be hydrated overnight.  He left the lab in stable condition.    Intervention  IMPRESSION:  1. Coronary artery disease involving coronary bypass graft of native heart with angina pectoris ([place])   2. Hx of CABG   3. Essential hypertension   4. Elevated Lp(a)   5. Hyperlipidemia LDL goal < 50   6. History of Long COVID-19   7. Type 2 diabetes mellitus without complication, without long-term current use of insulin ([place])   8. Statin intolerance   9. Medication management      ASSESSMENT AND PLAN: Mr. Mora Appl is a 72 year-old Caucasian male who underwent CABG revascularization surgery by Dr. Roxan Hockey in November 2002.  Remotely he had noticed mild shortness of breath and fatigue and never experienced classic substernal chest pressure.  A nuclear perfusion study in 2015 continued to show normal perfusion but he developed ST segment changes of 1-2 mm.  Amlodipine was added to his medical regimen and since institution he felt improved.   A 4-year follow-up nuclear stress test in 2019 in anticipation of potential future shoulder surgery was unchanged from  previously and continued to show normal perfusion but again he developed asymptomatic ST depression which lasted 5 to 9 minutes into the recovery period for resolution.  He never had a shoulder surgery.  At his evaluation in August 2021 he was experiencing some episodes of chest discomfort.  A follow-up nuclear imaging study on April 05, 2020 was unchanged from his prior evaluation and remain low risk.  His most recent [place] study from October 23, 2021 suggested a small basal inferior region of scar with EF slightly reduced compared to previously.  I reviewed his most recent hospitalization where he presented with chest pain.  Repeat catheterization revealed 75% stenosis in the AV groove circumflex with patent grafts and with previously noted distal see occlusion in the PDA and PL vessel.  He underwent DFR guided PCI to the mid AV groove circumflex with insertion of a 2.5 x 12 mm Synergy stent placed by Dr. Gwenlyn Found.  Mr. Selina Cooley continues to be asymptomatic with reference to chest pain.  When I last saw him in September 2023 I recommended initiation of PCSK9 in addition particularly with his statin intolerance, recent Zetia intolerance and prior documentation of elevated LP(a).  He apparently was started on low-dose Praluent 75 mg daily and was tolerating this well.  His wife had issues with her PCSK9 inhibition and it appears that both of their treatments were discontinued.  I discussed with our pharmacist today and apparently he is still approved for Praluent until May 2024.  As result we will resume treatment but at 150 mg rather than 75 mg dose for more aggressive intervention.  In 3 to 4 months we will recheck a comprehensive metabolic panel and lipid panel.  His blood pressure today is stable on concomitant amlodipine 5 mg, lisinopril 40 mg and nebivolol 2.5 mg daily.  ECG shows sinus bradycardia 56 bpm.  He is taking Farxiga, glipizide and pioglitazone  for his diabetes mellitus.  We have continued him  on DAPT indefinitely as tolerated.  I will see him in 6 months for reevaluation or sooner as needed.    Troy Sine, MD, [facility]  08/16/2022 1:40 PM

## 2022-08-13 ENCOUNTER — Other Ambulatory Visit: Payer: Self-pay | Admitting: Cardiovascular Disease

## 2022-08-14 DIAGNOSIS — K080 Exfoliation of teeth due to systemic causes: Secondary | ICD-10-CM | POA: Diagnosis not present

## 2022-08-16 ENCOUNTER — Encounter: Payer: Self-pay | Admitting: Cardiovascular Disease

## 2022-08-17 ENCOUNTER — Other Ambulatory Visit: Payer: Self-pay | Admitting: Family Medicine

## 2022-08-19 ENCOUNTER — Telehealth: Payer: Self-pay | Admitting: Family Medicine

## 2022-08-19 ENCOUNTER — Other Ambulatory Visit: Payer: Self-pay | Admitting: Cardiovascular Disease

## 2022-08-19 NOTE — Telephone Encounter (Signed)
Forwarded to [employer]

## 2022-08-19 NOTE — Telephone Encounter (Signed)
Prescription Request  08/19/2022  Is this a "Controlled Substance" medicine? No  LOV: 07/22/2022  What is the name of the medication or equipment? tamsulosin (FLOMAX) 0.4 MG CAPS capsule   Have you contacted your pharmacy to request a refill? Yes   Which pharmacy would you like this sent to?  [id number] [id number]: [phone]: [phone]   Patient notified that their request is being sent to the clinical staff for review and that they should receive a response within 2 business days.   Please advise at Mobile [phone](mobile)

## 2022-08-21 ENCOUNTER — Other Ambulatory Visit: Payer: Self-pay | Admitting: Family Medicine

## 2022-08-21 MED ORDER — TAMSULOSIN HCL 0.4 MG PO CAPS: 0.4000 mg | ORAL_CAPSULE | Freq: Every day | ORAL | 3 refills | Status: DC

## 2022-08-21 NOTE — Addendum Note (Signed)
Addended by: Alysia Penna A on: 08/21/2022 03:24 PM   Modules accepted: Orders

## 2022-08-21 NOTE — Telephone Encounter (Signed)
Done

## 2022-08-21 NOTE — Telephone Encounter (Signed)
Medication not on med list.   [facility]

## 2022-08-24 ENCOUNTER — Telehealth: Payer: Self-pay | Admitting: Cardiovascular Disease

## 2022-08-24 ENCOUNTER — Other Ambulatory Visit (HOSPITAL_COMMUNITY): Payer: Self-pay

## 2022-08-24 MED ORDER — REPATHA SURECLICK 140 MG/ML ~~LOC~~ SOAJ: 140.0000 mg | SUBCUTANEOUS | 3 refills | Status: DC

## 2022-08-24 NOTE — Telephone Encounter (Signed)
Pharmacy Patient Advocate Encounter  Prior Authorization for Repatha 140MG/ML has been approved/NOT REQUIRED    Effective dates: 1.1.24 through 12.31.24  Cost is approx

## 2022-08-24 NOTE — Telephone Encounter (Signed)
Insurance preferred [id number] is now Repatha. Will initiate PA for for Repatha

## 2022-08-24 NOTE — Telephone Encounter (Signed)
Pt c/o medication issue:  1. Name of Medication: Alirocumab (PRALUENT) 150 MG/ML SOAJ   2. How are you currently taking this medication (dosage and times per day)?    3. Are you having a reaction (difficulty breathing--STAT)? no  4. What is your medication issue? Wife is saying the insurance is now not covering this medication. Calling to see what the options are. Please advise

## 2022-08-24 NOTE — Telephone Encounter (Signed)
Spoke to patient and his wife about the approval and insurance preference.  [facility] and patient is aware of follow up [place] in 2-3 months. (Lab is already ordered by Dr.Kelly)

## 2022-08-25 ENCOUNTER — Telehealth: Payer: Self-pay | Admitting: Pharmacist

## 2022-08-25 ENCOUNTER — Telehealth: Payer: Self-pay

## 2022-08-25 NOTE — Telephone Encounter (Signed)
Prior Authorization for Repatha 140MG/ML has been approved/NOT REQUIRED     Effective dates: 1.1.24 through 12.31.24

## 2022-08-25 NOTE — Telephone Encounter (Signed)
P/a was complete yesterday. See encounter from 2/19 that's listed as medication management

## 2022-09-20 ENCOUNTER — Other Ambulatory Visit: Payer: Self-pay | Admitting: Physician Assistant

## 2022-09-21 ENCOUNTER — Telehealth: Payer: Self-pay | Admitting: Family Medicine

## 2022-09-21 DIAGNOSIS — M5432 Sciatica, left side: Secondary | ICD-10-CM

## 2022-09-21 NOTE — Telephone Encounter (Signed)
Pt would like to start taking 2 Gabapentins in the a.m, and 2 in the p.m.  Please advise.  LOV:   07/22/22  [id number] [place] [place] [address] Phone: [phone]  Fax: [phone]

## 2022-09-21 NOTE — Telephone Encounter (Signed)
Please advise on directions requested, pt has enough refills at the pharmacy

## 2022-09-21 NOTE — Telephone Encounter (Signed)
I agree. Take 2 capsules (600 mg) twice a day

## 2022-09-22 ENCOUNTER — Other Ambulatory Visit: Payer: Self-pay

## 2022-09-22 ENCOUNTER — Ambulatory Visit: Payer: Medicare Other | Admitting: Internal Medicine

## 2022-09-22 DIAGNOSIS — R0602 Shortness of breath: Secondary | ICD-10-CM | POA: Diagnosis not present

## 2022-09-22 DIAGNOSIS — M5432 Sciatica, left side: Secondary | ICD-10-CM

## 2022-09-22 MED ORDER — GABAPENTIN 300 MG PO CAPS: ORAL_CAPSULE | ORAL | 5 refills | Status: DC

## 2022-09-22 NOTE — Telephone Encounter (Signed)
Spouse called back to say that Pt already started taking 2 pills, twice a day; therefore he is now completley out.  Spouse wanted to inform MD that the pharmacy is saying it is too soon for them to pick up a refill.  Please advise.

## 2022-09-22 NOTE — Telephone Encounter (Signed)
New Rx for Gabapentin was sent to pt pharmacy per Dr Sarajane Jews, Left detailed message for pt to contact pharmacy for pick up

## 2022-10-01 ENCOUNTER — Ambulatory Visit: Payer: Medicare Other | Admitting: Cardiovascular Disease

## 2022-10-08 ENCOUNTER — Telehealth: Payer: Self-pay | Admitting: Family Medicine

## 2022-10-08 NOTE — Telephone Encounter (Signed)
Contacted [name] to schedule their annual wellness visit. Appointment made for 10/15/22.  [employer] [facility] direct phone # [phone]  On 10/15/22 appt time change from 9 to 12 Lm and sent my chart message with appt time change 4/4

## 2022-10-12 ENCOUNTER — Ambulatory Visit (INDEPENDENT_AMBULATORY_CARE_PROVIDER_SITE_OTHER): Payer: Medicare Other | Admitting: Internal Medicine

## 2022-10-12 ENCOUNTER — Encounter: Payer: Self-pay | Admitting: Internal Medicine

## 2022-10-12 VITALS — BP 130/62 | HR 53 | Ht 69.5 in | Wt 220.2 lb

## 2022-10-12 DIAGNOSIS — E1159 Type 2 diabetes mellitus with other circulatory complications: Secondary | ICD-10-CM

## 2022-10-12 DIAGNOSIS — E1165 Type 2 diabetes mellitus with hyperglycemia: Secondary | ICD-10-CM

## 2022-10-12 DIAGNOSIS — E1142 Type 2 diabetes mellitus with diabetic polyneuropathy: Secondary | ICD-10-CM

## 2022-10-12 LAB — POCT GLYCOSYLATED HEMOGLOBIN (HGB A1C): Hemoglobin A1C: 7.8 % — AB (ref 4.0–5.6)

## 2022-10-12 MED ORDER — DAPAGLIFLOZIN PROPANEDIOL 10 MG PO TABS: 10.0000 mg | ORAL_TABLET | Freq: Every day | ORAL | 3 refills | Status: DC

## 2022-10-12 MED ORDER — PIOGLITAZONE HCL 45 MG PO TABS: 45.0000 mg | ORAL_TABLET | Freq: Every day | ORAL | 3 refills | Status: DC

## 2022-10-12 MED ORDER — GLIPIZIDE 5 MG PO TABS: ORAL_TABLET | ORAL | 3 refills | Status: DC

## 2022-10-12 NOTE — Patient Instructions (Addendum)
-   Increase Glipizide 5 mg, 2 tablets before Breakfast and 1 tablet before supper - Continue Actos 45 mg d a tablets  - Continue Farxiga 10 mg once a day     HOW TO TREAT LOW BLOOD SUGARS (Blood sugar LESS THAN 70 MG/DL) Please follow the RULE OF 15 for the treatment of hypoglycemia treatment (when your (blood sugars are less than 70 mg/dL)   STEP 1: Take 15 grams of carbohydrates when your blood sugar is low, which includes:  3-4 GLUCOSE TABS  OR 3-4 OZ OF JUICE OR REGULAR SODA OR ONE TUBE OF GLUCOSE GEL    STEP 2: RECHECK blood sugar in 15 MINUTES STEP 3: If your blood sugar is still low at the 15 minute recheck --> then, go back to STEP 1 and treat AGAIN with another 15 grams of carbohydrates.

## 2022-10-12 NOTE — Progress Notes (Signed)
Name: [name]   Age/ Sex: 72 y.o. male   MRN/ DOB: [id number]/ 09-11-1950     PCP: Nelwyn Salisbury, MD   Reason for Endocrinology Evaluation: Type 2 Diabetes Mellitus  Initial Endocrine Consultative Visit: 08/31/2018    PATIENT IDENTIFIER: [name] is a 72 y.o. male with a past medical history of T2DM,CAD (S/P CABG). The patient has followed with Endocrinology clinic since 08/31/2018 for consultative assistance with management of his diabetes.  DIABETIC HISTORY:  [name] was diagnosed with T2DM in 2010, he has been on Metformin and Actos for many years, he is intolerant to Victoza due to rash. Januvia has been ineffective , which he stopped 08/2018. He has never been on insulin . His hemoglobin A1c has ranged from 6.1% in 2017, peaking at 10.4% in 2019  On his initial presentation his A1c was 8.5% . He was on Metformin and Actos . We added Farxiga   Glipizide started 04/2020 Self stopped Metformin due to diarrhea 09/2020  SUBJECTIVE:   During the last visit (03/23/2022): A1c 7.6%.     Today (10/12/2022): [name] is here for a  follow up visit on his diabetes.he is accompanied by his spouse today.  He checks his sugar occasionally. He did not bring his meter today. He denies hypoglycemia since his last visit of here   Patient had a follow-up with cardiology 08/12/2022, s/p PCI 01/2022 for unstable angina  Denies nausea or diarrhea  Occasional SOB but no chest pain    HOME DIABETES REGIMEN:  Actos 45 mg daily  Farxiga 10 mg daily Glipizide 5 mg BID     GLUCOSE LOG : Did not bring     DIABETIC COMPLICATIONS: Microvascular complications:  Neuropathy  Denies: ckd , retinopathy  Last eye exam: Completed 2023    Macrovascular complications:  CAD ( S/p CABG) , S/P PCI 01/2022 Denies: PVD, CVA      HISTORY:  Past Medical History:  Past Medical History:  Diagnosis Date   CAD (coronary artery disease)    nuclear stress test-09/09/11  low risk scan EF61%, sees Dr. Tresa Endo    Chronic headache    Chronic neck pain    Diabetes mellitus    sees Dr. Lonzo Cloud   Hyperlipidemia    Hypertension 12/26/2008   ECHO- EF>55%   Insomnia    Past Surgical History:  Past Surgical History:  Procedure Laterality Date   APPENDECTOMY     cervical spine injection     COLONOSCOPY  04-02-06    per [facility], clear, repeat in 10 yrs   CORONARY ARTERY BYPASS GRAFT  2002   4 vessels   CORONARY PRESSURE/FFR STUDY N/A 01/12/2022   Procedure: INTRAVASCULAR PRESSURE WIRE/FFR STUDY;  Surgeon: Runell Gess, MD;  Location: [facility];  Service: Cardiovascular;  Laterality: N/A;   CORONARY STENT INTERVENTION N/A 01/12/2022   Procedure: CORONARY STENT INTERVENTION;  Surgeon: Runell Gess, MD;  Location: [facility];  Service: Cardiovascular;  Laterality: N/A;   LEFT HEART CATH AND CORS/GRAFTS ANGIOGRAPHY N/A 01/12/2022   Procedure: LEFT HEART CATH AND CORS/GRAFTS ANGIOGRAPHY;  Surgeon: Runell Gess, MD;  Location: [facility];  Service: Cardiovascular;  Laterality: N/A;   Social History:  reports that he quit smoking about 51 years ago. His smoking use included cigarettes. He has a 2.50 pack-year smoking history. He has never used smokeless tobacco. He reports that he does not drink alcohol and does  not use drugs. Family History:  Family History  Problem Relation Age of Onset   Liver cancer Father    Cancer Brother    Cancer Brother    Cancer Brother    Coronary artery disease Other    Hypertension Other    Heart disease Mother    Colon cancer Neg Hx    Stomach cancer Neg Hx    Esophageal cancer Neg Hx      HOME MEDICATIONS: Allergies as of 10/12/2022       Reactions   Codeine Anaphylaxis   Pt. verbalized that he has taken and can take Hydrocodone, Oxycodone w/o issue - codeine allergy only   Ambien [zolpidem Tartrate] Other (See Comments)   Headache    Crestor [rosuvastatin]    Myalgia    Isordil  [isosorbide Nitrate] Other (See Comments)   Headaches   Lipitor [atorvastatin] Other (See Comments)   Myalgia    Zetia [ezetimibe] Other (See Comments)   Myalgia    Cipro [ciprofloxacin Hcl] Hives, Rash   Victoza [liraglutide] Rash        Medication List        Accurate as of October 12, 2022 11:38 AM. If you have any questions, ask your nurse or doctor.          Accu-Chek Guide test strip Generic drug: glucose blood USE TO TEST BLOOD SUGAR EVERY DAY   amLODipine 5 MG tablet Commonly known as: NORVASC TAKE 1.5 TABLETS BY MOUTH DAILY.   aspirin 81 MG tablet Take 81 mg by mouth daily.   clonazePAM 0.5 MG tablet Commonly known as: KLONOPIN Take 1 tablet (0.5 mg total) by mouth 2 (two) times daily as needed for anxiety.   clopidogrel 75 MG tablet Commonly known as: PLAVIX Take 1 tablet (75 mg total) by mouth daily with breakfast.   CoQ10 100 MG Caps Take 100 mg by mouth daily.   dapagliflozin propanediol 10 MG Tabs tablet Commonly known as: Farxiga Take 1 tablet (10 mg total) by mouth daily.   gabapentin 300 MG capsule Commonly known as: NEURONTIN Take 2 (600 mg) capsules by mouth twice daily   glipiZIDE 5 MG tablet Commonly known as: GLUCOTROL TAKE 1 TABLET BY MOUTH EVERY DAY BEFORE BREAKFAST   lisinopril 40 MG tablet Commonly known as: ZESTRIL TAKE 1 TABLET BY MOUTH EVERY DAY   MELATONIN GUMMIES PO Take 2 each by mouth at bedtime.   nebivolol 2.5 MG tablet Commonly known as: BYSTOLIC TAKE 1 TABLET BY MOUTH EVERY DAY   nitroGLYCERIN 0.4 MG SL tablet Commonly known as: NITROSTAT Place 1 tablet (0.4 mg total) under the tongue every 5 (five) minutes as needed for chest pain.   Omega-3 350 MG Cpdr Take 350 mg by mouth daily.   oxyCODONE-acetaminophen 5-325 MG tablet Commonly known as: PERCOCET/ROXICET Take 1 tablet by mouth every 8 (eight) hours as needed for severe pain.   pioglitazone 45 MG tablet Commonly known as: ACTOS Take 1 tablet (45 mg  total) by mouth daily.   Repatha SureClick 140 MG/ML Soaj Generic drug: Evolocumab Inject 140 mg into the skin every 14 (fourteen) days.   sildenafil 100 MG tablet Commonly known as: Viagra Take 1 tablet (100 mg total) by mouth daily as needed for erectile dysfunction.   tamsulosin 0.4 MG Caps capsule Commonly known as: FLOMAX Take 1 capsule (0.4 mg total) by mouth daily.       PHYSICAL EXAM: VS: BP 130/62 (BP Location: Left Arm, Patient Position: Sitting, Cuff Size:  Normal)   Pulse (!) 53   Ht 5' 9.5" (1.765 m)   Wt 220 lb 3.2 oz (99.9 kg)   SpO2 97%   BMI 32.05 kg/m    EXAM: General: Pt appears well and is in NAD  Lungs: Clear with good BS bilat with no rales, rhonchi, or wheezes  Heart: Auscultation: RRR with normal S1 and S2, no gallops or murmurs  Abdomen: Normoactive bowel sounds, soft, nontender, without masses or organomegaly palpable  Extremities: BL LE: no pretibial edema normal ROM and strength, no joint enlargement or tenderness  Mental Status: Judgment, insight: intact Mood and affect: no depression, anxiety, or agitation     DM Foot Exam 10/12/2022 The skin of the feet is intact without sores or ulcerations. The pedal pulses are 1+ on right and 1+ on left. The sensation is intact to a screening 5.07, 10 gram monofilament bilaterally   DATA REVIEWED:  Lab Results  Component Value Date   HGBA1C 7.8 (A) 10/12/2022   HGBA1C 7.6 (A) 03/23/2022   HGBA1C 7.8 (H) 01/10/2022    Latest Reference Range & Units 01/13/22 04:31  Sodium 135 - 145 mmol/L 139  Potassium 3.5 - 5.1 mmol/L 3.9  Chloride 98 - 111 mmol/L 109  CO2 22 - 32 mmol/L 23  Glucose 70 - 99 mg/dL 409128 (H)  BUN 8 - 23 mg/dL 12  Creatinine 8.110.61 - 9.141.24 mg/dL 7.821.09  Calcium 8.9 - 95.610.3 mg/dL 8.7 (L)  Anion gap 5 - 15  7  GFR, Estimated >60 mL/min >60      ASSESSMENT / PLAN / RECOMMENDATIONS:   1) Type 2 Diabetes Mellitus, Sub-Optimally Controlled , With neuropathic and macrovascular  complications - Most recent A1c of 7.8  %. Goal A1c < 7.0 %.     - A1c is trending up -No glucose data today -Discussed the importance of glucose checks at home and having the data  available to me - Intolerant to Metformin due to diarrhea  -Will increase glipizide as below   MEDICATIONS: Increase glipizide 5 mg , 2 tablet before breakfast and 1 tablet before supper  Continue Farxiga 10 mg daily Continue Actos 45 mg daily   EDUCATION / INSTRUCTIONS: BG monitoring instructions: Patient is instructed to check his blood sugars 1 times a day, fasting . Call [facility] if: BG persistently < 70  I reviewed the Rule of 15 for the treatment of hypoglycemia in detail with the patient. Literature supplied.    2) Diabetic complications:  Eye: Does not have known diabetic retinopathy.  Neuro/ Feet: Does have known diabetic peripheral neuropathy. Renal: Patient does not have known baseline CKD. He is on an ACEI/ARB at present.    F/U in 6 months    Signed electronically by: Lyndle HerrlichAbby Jaralla Tejay Hubert, MD  [facility]  [facility] [address] [place], [place] [address] Phone: [phone] FAX: [phone]   CC: Nelwyn SalisburyFry, Stephen A, MD [address] [place] [address] Phone: [phone]  Fax: [phone]  Return to Endocrinology clinic as below: Future Appointments  Date Time Provider Department Center  10/12/2022 11:50 AM Tiffanye Hartmann, Konrad DoloresIbtehal Jaralla, MD [facility] None  10/15/2022 12:00 PM Terressa KoyanagiKim, Hannah R, DO [facility]

## 2022-10-15 ENCOUNTER — Encounter: Payer: Self-pay | Admitting: Family Medicine

## 2022-10-15 ENCOUNTER — Ambulatory Visit (INDEPENDENT_AMBULATORY_CARE_PROVIDER_SITE_OTHER): Payer: Medicare Other | Admitting: Family Medicine

## 2022-10-15 DIAGNOSIS — Z0000 Encounter for general adult medical examination without abnormal findings: Secondary | ICD-10-CM | POA: Diagnosis not present

## 2022-10-15 NOTE — Patient Instructions (Signed)
I really enjoyed getting to talk with you today! I am available on Tuesdays and Thursdays for virtual visits if you have any questions or concerns, or if I can be of any further assistance.   CHECKLIST FROM ANNUAL WELLNESS VISIT:  -Follow up (please call to schedule if not scheduled after visit):   -yearly for annual wellness visit with primary care office  Here is a list of your preventive care/health maintenance measures and the plan for each if any are due:  PLAN For any measures below that may be due:  -if you decide you would like to complete any of the measures out of date below, please let me or Dr. Clent Ridges know so that we can assist you.   Health Maintenance  Topic Date Due   Hepatitis C Screening  Never done   Diabetic kidney evaluation - Urine ACR  08/21/2009   COVID-19 Vaccine (1) 10/31/2022 (Originally 10/04/1955)   Zoster Vaccines- Shingrix (1 of 2) 01/14/2023 (Originally 10/03/1969)   Pneumonia Vaccine 24+ Years old (1 of 1 - PCV) 10/15/2023 (Originally 10/04/2014)   COLONOSCOPY (Pts 45-64yrs Insurance coverage will need to be confirmed)  10/15/2023 (Originally 04/02/2016)   OPHTHALMOLOGY EXAM  10/22/2022   INFLUENZA VACCINE  02/04/2023   DTaP/Tdap/Td (2 - Td or Tdap) 04/01/2023   HEMOGLOBIN A1C  04/13/2023   Diabetic kidney evaluation - eGFR measurement  08/11/2023   FOOT EXAM  10/12/2023   Medicare Annual Wellness (AWV)  10/15/2023   HPV VACCINES  Aged Out    -See a dentist at least yearly  -Get your eyes checked and then per your eye specialist's recommendations  -Other issues addressed today:   -I have included below further information regarding a healthy whole foods based diet, physical activity guidelines for adults, stress management and opportunities for social connections. I hope you find this information useful.    -----------------------------------------------------------------------------------------------------------------------------------------------------------------------------------------------------------------------------------------------------------  NUTRITION: -eat real food: lots of colorful vegetables (half the plate) and fruits -5-7 servings of vegetables and fruits per day (fresh or steamed is best), exp. 2 servings of vegetables with lunch and dinner and 2 servings of fruit per day. Berries and greens such as kale and collards are great choices.  -consume on a regular basis: whole grains (make sure first ingredient on label contains the word "whole"), fresh fruits, fish, nuts, seeds, healthy oils (such as olive oil, avocado oil, grape seed oil) -may eat small amounts of dairy and lean meat on occasion, but avoid processed meats such as ham, bacon, lunch meat, etc. -drink water -try to avoid fast food and pre-packaged foods, processed meat -most experts advise limiting sodium to < 2300mg  per day, should limit further is any chronic conditions such as high blood pressure, heart disease, diabetes, etc. The American Heart Association advised that < 1500mg  is is ideal -try to avoid foods that contain any ingredients with names you do not recognize  -try to avoid sugar/sweets (except for the natural sugar that occurs in fresh fruit) -try to avoid sweet drinks -try to avoid white rice, white bread, pasta (unless whole grain), white or yellow potatoes  EXERCISE GUIDELINES FOR ADULTS: -if you wish to increase your physical activity, do so gradually and with the approval of your doctor -STOP and seek medical care immediately if you have any chest pain, chest discomfort or trouble breathing when starting or increasing exercise  -move and stretch your body, legs, feet and arms when sitting for long periods -Physical activity guidelines for optimal health in adults: -least  150 minutes per week of  aerobic exercise (can talk, but not sing) once approved by your doctor, 20-30 minutes of sustained activity or two 10 minute episodes of sustained activity every day.  -resistance training at least 2 days per week if approved by your doctor -balance exercises 3+ days per week:   Stand somewhere where you have something sturdy to hold onto if you lose balance.    1) lift up on toes, start with 5x per day and work up to 20x   2) stand and lift on leg straight out to the side so that foot is a few inches of the floor, start with 5x each side and work up to 20x each side   3) stand on one foot, start with 5 seconds each side and work up to 20 seconds on each side  If you need ideas or help with getting more active:  -Silver sneakers https://tools.silversneakers.com  -Walk with a Doc: http://www.duncan-williams.com/  -try to include resistance (weight lifting/strength building) and balance exercises twice per week: or the following link for ideas: http://castillo-powell.com/  BuyDucts.dk

## 2022-10-15 NOTE — Progress Notes (Signed)
PATIENT CHECK-IN and HEALTH RISK ASSESSMENT QUESTIONNAIRE:  -completed by phone/video for upcoming Medicare Preventive Visit  Pre-Visit Check-in: 1)Vitals (height, wt, BP, etc) - record in vitals section for visit on day of visit 2)Review and Update Medications, Allergies PMH, Surgeries, Social history in Epic 3)Hospitalizations in the last year with date/reason? 01/10/22; stent placed  4)Review and Update Care Team (patient's specialists) in Epic 5) Complete PHQ9 in Epic  6) Complete Fall Screening in Epic 7)Review all Health Maintenance Due and order under PCP if not done.  Medicare Wellness Patient Questionnaire:  Answer theses question about your habits: Do you drink alcohol? No If yes, how many drinks do you have a day?n/a Have you ever smoked?Yes Quit date if applicable? 1972   How many packs a day do/did you smoke? N/a Do you use smokeless tobacco?no Do you use an illicit drugs?no Do you exercises? Yes IF so, what type and how many days/minutes per week?walks 2x/day for about 30 mins - every day. He also does all of the yard work/mowing/etc.  Are you sexually active? Sometimes Number of partners?1 Reports is on the "see Food" diet; reports eats whatever he likes and is happy about Typical breakfast cereal, black coffee with peanut butter cracker; eggs & gravy Typical lunch skips Typical dinner protein, salad Typical snacks: Peanut butter crackers  Beverages: coffee, water; sugar free caffeine free ginger ales  Answer theses question about you: Can you perform most household chores? Mows grass, washes cars Do you find it hard to follow a conversation in a noisy room? No Do you often ask people to speak up or repeat themselves? Yes - but reports is ok for now and does not feel needs an audiology evaluation at this time - declined Do you feel that you have a problem with memory? No Do you balance your checkbook and or bank acounts? No, wife does it Do you feel safe at home?  Yes Last dentist visit? 10/12/22 Do you need assistance with any of the following: Please note if so No  Driving?  Feeding yourself?  Getting from bed to chair?  Getting to the toilet?  Bathing or showering?  Dressing yourself?  Managing money?  Climbing a flight of stairs  Preparing meals?    Do you have Advanced Directives in place (Living Will, Healthcare Power or Attorney)? Yes   Last eye Exam and location? 2023 (has appt this month) at [facility]   Do you currently use prescribed or non-prescribed narcotic or opioid pain medications?No  Do you have a history or close family history of breast, ovarian, tubal or peritoneal cancer or a family member with BRCA (breast cancer susceptibility 1 and 2) gene mutations? No  Nurse/Assistant Credentials/time stamp: Claudette Laws BSN, Editor, commissioning [facility] Clinical RN Supervisor    ----------------------------------------------------------------------------------------------------------------------------------------------------------------------------------------------------------------------    MEDICARE ANNUAL PREVENTIVE CARE VISIT WITH PROVIDER (Welcome to [facility], initial annual wellness or annual wellness exam)  Virtual Visit via Phone Note  I connected with [name] on 10/15/22  by phone  and verified that I am speaking with the correct person using two identifiers.  Location patient: home Location provider:work or home office Persons participating in the virtual visit: patient, provider  Concerns and/or follow up today: reports is doing well - no concerns today.    See HM section in Epic for other details of completed HM.    ROS: negative for report of fevers, unintentional weight loss, vision changes, vision loss, hearing loss or change, chest  pain, sob, hemoptysis, melena, hematochezia, hematuria, falls, bleeding or bruising, thoughts of suicide or self harm, memory loss  Patient-completed  extensive health risk assessment - reviewed and discussed with the patient: See Health Risk Assessment completed with patient prior to the visit either above or in recent phone note. This was reviewed in detailed with the patient today and appropriate recommendations, orders and referrals were placed as needed per Summary below and patient instructions.   Review of Medical History: -PMH, PSH, Family History and current specialty and care providers reviewed and updated and listed below   Patient Care Team: Nelwyn Salisbury, MD as PCP - General Lennette Bihari, MD as PCP - Cardiology (Cardiology) Ander Purpura, OD (Optometry)   Past Medical History:  Diagnosis Date   CAD (coronary artery disease)    nuclear stress test-09/09/11 low risk scan EF61%, sees Dr. Tresa Endo    Chronic headache    Chronic neck pain    Diabetes mellitus    sees Dr. Lonzo Cloud   Hyperlipidemia    Hypertension 12/26/2008   ECHO- EF>55%   Insomnia     Past Surgical History:  Procedure Laterality Date   APPENDECTOMY     cervical spine injection     COLONOSCOPY  04-02-06    per [facility], clear, repeat in 10 yrs   CORONARY ARTERY BYPASS GRAFT  2002   4 vessels   CORONARY PRESSURE/FFR STUDY N/A 01/12/2022   Procedure: INTRAVASCULAR PRESSURE WIRE/FFR STUDY;  Surgeon: Runell Gess, MD;  Location: [facility] INVASIVE CV [facility];  Service: Cardiovascular;  Laterality: N/A;   CORONARY STENT INTERVENTION N/A 01/12/2022   Procedure: CORONARY STENT INTERVENTION;  Surgeon: Runell Gess, MD;  Location: [facility] INVASIVE CV LAB;  Service: Cardiovascular;  Laterality: N/A;   LEFT HEART CATH AND CORS/GRAFTS ANGIOGRAPHY N/A 01/12/2022   Procedure: LEFT HEART CATH AND CORS/GRAFTS ANGIOGRAPHY;  Surgeon: Runell Gess, MD;  Location: [facility] INVASIVE CV LAB;  Service: Cardiovascular;  Laterality: N/A;    Social History   Socioeconomic History   Marital status: Married    Spouse name: Not on file   Number of children: Not on file   Years  of education: Not on file   Highest education level: Not on file  Occupational History   Not on file  Tobacco Use   Smoking status: Former    Packs/day: 0.25    Years: 10.00    Additional pack years: 0.00    Total pack years: 2.50    Types: Cigarettes    Quit date: 11/26/1970    Years since quitting: 51.9   Smokeless tobacco: Never  Vaping Use   Vaping Use: Never used  Substance and Sexual Activity   Alcohol use: No    Alcohol/week: 0.0 standard drinks of alcohol   Drug use: No   Sexual activity: Yes  Other Topics Concern   Not on file  Social History Narrative   Not on file   Social Determinants of Health   Financial Resource Strain: Low Risk  (10/13/2021)   Overall Financial Resource Strain (CARDIA)    Difficulty of Paying Living Expenses: Not hard at all  Food Insecurity: No Food Insecurity (10/13/2021)   Hunger Vital Sign    Worried About Running Out of Food in the Last Year: Never true    Ran Out of Food in the Last Year: Never true  Transportation Needs: No Transportation Needs (10/13/2021)   PRAPARE - Administrator, Civil Service (Medical): No  Lack of Transportation (Non-Medical): No  Physical Activity: Sufficiently Active (10/13/2021)   Exercise Vital Sign    Days of Exercise per Week: 5 days    Minutes of Exercise per Session: 30 min  Stress: No Stress Concern Present (10/13/2021)   [employer] of Occupational Health - Occupational Stress Questionnaire    Feeling of Stress : Not at all  Social Connections: Socially Integrated (10/13/2021)   Social Connection and Isolation Panel [NHANES]    Frequency of Communication with Friends and Family: More than three times a week    Frequency of Social Gatherings with Friends and Family: More than three times a week    Attends Religious Services: More than 4 times per year    Active Member of [employer] or Organizations: Yes    Attends Engineer, structural: More than 4 times per year    Marital  Status: Married  Catering manager Violence: Not At Risk (10/13/2021)   Humiliation, Afraid, Rape, and Kick questionnaire    Fear of Current or Ex-Partner: No    Emotionally Abused: No    Physically Abused: No    Sexually Abused: No    Family History  Problem Relation Age of Onset   Liver cancer Father    Cancer Brother    Cancer Brother    Cancer Brother    Coronary artery disease Other    Hypertension Other    Heart disease Mother    Colon cancer Neg Hx    Stomach cancer Neg Hx    Esophageal cancer Neg Hx     Current Outpatient Medications on File Prior to Visit  Medication Sig Dispense Refill   ACCU-CHEK GUIDE test strip USE TO TEST BLOOD SUGAR EVERY DAY 100 strip 3   amLODipine (NORVASC) 5 MG tablet TAKE 1.5 TABLETS BY MOUTH DAILY. 135 tablet 3   aspirin 81 MG tablet Take 81 mg by mouth daily.     clonazePAM (KLONOPIN) 0.5 MG tablet Take 1 tablet (0.5 mg total) by mouth 2 (two) times daily as needed for anxiety. 60 tablet 5   clopidogrel (PLAVIX) 75 MG tablet Take 1 tablet (75 mg total) by mouth daily with breakfast. 90 tablet 3   Coenzyme Q10 (COQ10) 100 MG CAPS Take 100 mg by mouth daily.      dapagliflozin propanediol (FARXIGA) 10 MG TABS tablet Take 1 tablet (10 mg total) by mouth daily. 90 tablet 3   Evolocumab (REPATHA SURECLICK) 140 MG/ML SOAJ Inject 140 mg into the skin every 14 (fourteen) days. 6 mL 3   gabapentin (NEURONTIN) 300 MG capsule Take 2 (600 mg) capsules by mouth twice daily 120 capsule 5   glipiZIDE (GLUCOTROL) 5 MG tablet Take 2 tablets (10 mg total) by mouth daily before breakfast AND 1 tablet (5 mg total) daily before supper. 270 tablet 3   lisinopril (ZESTRIL) 40 MG tablet TAKE 1 TABLET BY MOUTH EVERY DAY 90 tablet 3   MELATONIN GUMMIES PO Take 2 each by mouth at bedtime.     nebivolol (BYSTOLIC) 2.5 MG tablet TAKE 1 TABLET BY MOUTH EVERY DAY 90 tablet 3   nitroGLYCERIN (NITROSTAT) 0.4 MG SL tablet Place 1 tablet (0.4 mg total) under the tongue every 5  (five) minutes as needed for chest pain. 25 tablet 6   Omega-3 350 MG CPDR Take 350 mg by mouth daily.     pioglitazone (ACTOS) 45 MG tablet Take 1 tablet (45 mg total) by mouth daily. 90 tablet 3   sildenafil (VIAGRA)  100 MG tablet Take 1 tablet (100 mg total) by mouth daily as needed for erectile dysfunction. 10 tablet 11   tamsulosin (FLOMAX) 0.4 MG CAPS capsule Take 1 capsule (0.4 mg total) by mouth daily. 90 capsule 3   oxyCODONE-acetaminophen (PERCOCET/ROXICET) 5-325 MG tablet Take 1 tablet by mouth every 8 (eight) hours as needed for severe pain. (Patient not taking: Reported on 10/15/2022) 10 tablet 0   No current facility-administered medications on file prior to visit.    Allergies  Allergen Reactions   Codeine Anaphylaxis    Pt. verbalized that he has taken and can take Hydrocodone, Oxycodone w/o issue - codeine allergy only   Ambien [Zolpidem Tartrate] Other (See Comments)    Headache    Crestor [Rosuvastatin]     Myalgia    Isordil [Isosorbide Nitrate] Other (See Comments)    Headaches   Lipitor [Atorvastatin] Other (See Comments)    Myalgia    Zetia [Ezetimibe] Other (See Comments)    Myalgia    Cipro [Ciprofloxacin Hcl] Hives and Rash   Victoza [Liraglutide] Rash       Physical Exam There were no vitals filed for this visit. Estimated body mass index is 32.05 kg/m as calculated from the following:   Height as of 10/12/22: 5' 9.5" (1.765 m).   Weight as of 10/12/22: 220 lb 3.2 oz (99.9 kg).  EKG (optional): deferred due to virtual visit  GENERAL: alert, oriented, no acute distress detected; full vision exam deferred due to pandemic and/or virtual encounter  PSYCH/NEURO: pleasant and cooperative, no obvious depression or anxiety, speech and thought processing grossly intact, Cognitive function grossly intact  Flowsheet Row [facility]  PHQ-9 Total Score 4           10/15/2022   11:37 AM 12/02/2021    11:33 AM 10/13/2021    8:59 AM 09/02/2021    3:02 PM 08/05/2021    8:08 AM  Depression screen PHQ 2/9  Decreased Interest 0 0 0 0 0  Down, Depressed, Hopeless 0 0 0 0 0  PHQ - 2 Score 0 0 0 0 0  Altered sleeping  2 0 2 3  Tired, decreased energy  2 0 2 0  Change in appetite  0 0 1 0  Feeling bad or failure about yourself   0 0 0 0  Trouble concentrating  0 0 0 0  Moving slowly or fidgety/restless  0 0 0 0  Suicidal thoughts  0 0 0 0  PHQ-9 Score  4 0 5 3       01/11/2022    9:00 PM 01/12/2022    8:00 AM 01/12/2022    7:34 PM 01/13/2022    8:00 AM 10/15/2022   11:35 AM  Fall Risk  Falls in the past year?     1  Was there an injury with Fall?     0  Fall Risk Category Calculator     1  (RETIRED) Patient Fall Risk Level Low fall risk Low fall risk Low fall risk Low fall risk   Patient at Risk for Falls Due to     No Fall Risks  Fall risk Follow up     Falls evaluation completed  Feet were tangled in a vine when cutting brush - otherwise denies any balance issues or falls. Reports at any age would have fallen.    SUMMARY AND PLAN:  Encounter for Medicare annual wellness exam  Discussed applicable health maintenance/preventive health measures and advised and referred or ordered per patient preferences: -declines most, discussed and advised of options if changes mind Health Maintenance  Topic Date Due   Hepatitis C Screening  Never done   Diabetic kidney evaluation - Urine ACR  08/21/2009   COVID-19 Vaccine (1) 10/31/2022 (Originally 10/04/1955)   Zoster Vaccines- Shingrix (1 of 2) 01/14/2023 (Originally 10/03/1969)   Pneumonia Vaccine 80+ Years old (1 of 1 - PCV) 10/15/2023 (Originally 10/04/2014)   COLONOSCOPY (Pts 45-28yrs Insurance coverage will need to be confirmed)  10/15/2023 (Originally 04/02/2016) - reports he does not wish to do any form of colon cancer screening. Understands risks.    OPHTHALMOLOGY EXAM  10/22/2022   INFLUENZA VACCINE  02/04/2023   DTaP/Tdap/Td (2 - Td  or Tdap) 04/01/2023   HEMOGLOBIN A1C  04/13/2023   Diabetic kidney evaluation - eGFR measurement  08/11/2023   FOOT EXAM  10/12/2023   Medicare Annual Wellness (AWV)  10/15/2023   HPV VACCINES  Aged [employer] and counseling on the following was provided based on the above review of health and a plan/checklist for the patient, along with additional information discussed, was provided for the patient in the patient instructions :   -Provided counseling and plan for difficulty hearing - declined evaluation at this time -Provided in patient instructions safe balance exercises that can be done at home to improve balance and discussed exercise guidelines for adults with include balance exercises at least 3 days per week.  -Reviewed patient's current diet. He is content with current diet.  A summary of a healthy diet was provided in the Patient Instructions.  -reviewed patient's current physical activity level and discussed exercise guidelines for adults.Congratulated on current exercise, encouraged to continue, exercise guidelines and further info provided in patient instructions.   Follow up: see patient instructions   Patient Instructions  I really enjoyed getting to talk with you today! I am available on Tuesdays and Thursdays for virtual visits if you have any questions or concerns, or if I can be of any further assistance.   CHECKLIST FROM ANNUAL WELLNESS VISIT:  -Follow up (please call to schedule if not scheduled after visit):   -yearly for annual wellness visit with primary care office  Here is a list of your preventive care/health maintenance measures and the plan for each if any are due:  PLAN For any measures below that may be due:  -if you decide you would like to complete any of the measures out of date below, please let me or Dr. Clent Ridges know so that we can assist you.   Health Maintenance  Topic Date Due   Hepatitis C Screening  Never done   Diabetic kidney evaluation -  Urine ACR  08/21/2009   COVID-19 Vaccine (1) 10/31/2022 (Originally 10/04/1955)   Zoster Vaccines- Shingrix (1 of 2) 01/14/2023 (Originally 10/03/1969)   Pneumonia Vaccine 26+ Years old (1 of 1 - PCV) 10/15/2023 (Originally 10/04/2014)   COLONOSCOPY (Pts 45-18yrs Insurance coverage will need to be confirmed)  10/15/2023 (Originally 04/02/2016)   OPHTHALMOLOGY EXAM  10/22/2022   INFLUENZA VACCINE  02/04/2023   DTaP/Tdap/Td (2 - Td or Tdap) 04/01/2023   HEMOGLOBIN A1C  04/13/2023   Diabetic kidney evaluation - eGFR measurement  08/11/2023   FOOT EXAM  10/12/2023   Medicare Annual Wellness (AWV)  10/15/2023   HPV VACCINES  Aged Out    -See a dentist at least yearly  -Get your eyes checked and then per  your eye specialist's recommendations  -Other issues addressed today:   -I have included below further information regarding a healthy whole foods based diet, physical activity guidelines for adults, stress management and opportunities for social connections. I hope you find this information useful.   -----------------------------------------------------------------------------------------------------------------------------------------------------------------------------------------------------------------------------------------------------------  NUTRITION: -eat real food: lots of colorful vegetables (half the plate) and fruits -5-7 servings of vegetables and fruits per day (fresh or steamed is best), exp. 2 servings of vegetables with lunch and dinner and 2 servings of fruit per day. Berries and greens such as kale and collards are great choices.  -consume on a regular basis: whole grains (make sure first ingredient on label contains the word "whole"), fresh fruits, fish, nuts, seeds, healthy oils (such as olive oil, avocado oil, grape seed oil) -may eat small amounts of dairy and lean meat on occasion, but avoid processed meats such as ham, bacon, lunch meat, etc. -drink water -try to  avoid fast food and pre-packaged foods, processed meat -most experts advise limiting sodium to < 2300mg  per day, should limit further is any chronic conditions such as high blood pressure, heart disease, diabetes, etc. The American Heart Association advised that < 1500mg  is is ideal -try to avoid foods that contain any ingredients with names you do not recognize  -try to avoid sugar/sweets (except for the natural sugar that occurs in fresh fruit) -try to avoid sweet drinks -try to avoid white rice, white bread, pasta (unless whole grain), white or yellow potatoes  EXERCISE GUIDELINES FOR ADULTS: -if you wish to increase your physical activity, do so gradually and with the approval of your doctor -STOP and seek medical care immediately if you have any chest pain, chest discomfort or trouble breathing when starting or increasing exercise  -move and stretch your body, legs, feet and arms when sitting for long periods -Physical activity guidelines for optimal health in adults: -least 150 minutes per week of aerobic exercise (can talk, but not sing) once approved by your doctor, 20-30 minutes of sustained activity or two 10 minute episodes of sustained activity every day.  -resistance training at least 2 days per week if approved by your doctor -balance exercises 3+ days per week:   Stand somewhere where you have something sturdy to hold onto if you lose balance.    1) lift up on toes, start with 5x per day and work up to 20x   2) stand and lift on leg straight out to the side so that foot is a few inches of the floor, start with 5x each side and work up to 20x each side   3) stand on one foot, start with 5 seconds each side and work up to 20 seconds on each side  If you need ideas or help with getting more active:  -Silver sneakers https://tools.silversneakers.com  -Walk with a Doc: http://www.duncan-williams.com/  -try to include resistance (weight lifting/strength building) and balance exercises  twice per week: or the following link for ideas: http://castillo-powell.com/  BuyDucts.dk           Terressa Koyanagi, DO

## 2022-10-16 ENCOUNTER — Other Ambulatory Visit: Payer: Self-pay | Admitting: Cardiology

## 2022-10-21 DIAGNOSIS — E119 Type 2 diabetes mellitus without complications: Secondary | ICD-10-CM | POA: Diagnosis not present

## 2022-10-27 ENCOUNTER — Encounter: Payer: Self-pay | Admitting: Family Medicine

## 2022-10-27 ENCOUNTER — Ambulatory Visit (INDEPENDENT_AMBULATORY_CARE_PROVIDER_SITE_OTHER): Payer: Medicare Other | Admitting: Family Medicine

## 2022-10-27 VITALS — BP 124/70 | HR 54 | Temp 97.9°F | Wt 215.0 lb

## 2022-10-27 DIAGNOSIS — G8929 Other chronic pain: Secondary | ICD-10-CM

## 2022-10-27 DIAGNOSIS — F5101 Primary insomnia: Secondary | ICD-10-CM | POA: Diagnosis not present

## 2022-10-27 DIAGNOSIS — M5442 Lumbago with sciatica, left side: Secondary | ICD-10-CM

## 2022-10-27 MED ORDER — TRAZODONE HCL 50 MG PO TABS: 50.0000 mg | ORAL_TABLET | Freq: Every day | ORAL | 3 refills | Status: DC

## 2022-10-27 NOTE — Progress Notes (Signed)
   Subjective:    Patient ID: [name], male    DOB: [birth year], 72 y.o.   MRN: [id number]  HPI Here to discuss his insomnia and also the chronic pain in his left buttock and left leg. He has had trouble sleeping for years. He has tried Ambien and Temazepam, but these made him feel groggy the next day. As for the back and leg pain, he currently he takes Tylenol as needed. He had a CT of the lumbar spine on 03-03-22 showing herniated discs at L3-L4 and at L4-L5 which were impinging on the left nerve roots. The pain has worsened over the past few months,    Review of Systems  Constitutional: Negative.   Respiratory: Negative.    Cardiovascular: Negative.   Musculoskeletal:  Positive for back pain.  Psychiatric/Behavioral:  Positive for sleep disturbance. Negative for agitation and dysphoric mood. The patient is not nervous/anxious.        Objective:   Physical Exam Constitutional:      Appearance: Normal appearance.  Cardiovascular:     Rate and Rhythm: Normal rate and regular rhythm.     Pulses: Normal pulses.     Heart sounds: Normal heart sounds.  Pulmonary:     Effort: Pulmonary effort is normal.     Breath sounds: Normal breath sounds.  Neurological:     General: No focal deficit present.     Mental Status: He is alert and oriented to person, place, and time.  Psychiatric:        Mood and Affect: Mood normal.           Assessment & Plan:  For the left sided sciatica, we will refer him to Neurosurgery to evaluate. For the insomnia, he will try taking Trazodone 50 mg at bedtime. We can titrate up on the dose if needed. Gershon Crane, MD

## 2022-11-09 DIAGNOSIS — Z79899 Other long term (current) drug therapy: Secondary | ICD-10-CM | POA: Diagnosis not present

## 2022-11-09 LAB — COMPREHENSIVE METABOLIC PANEL
ALT: 20 IU/L (ref 0–44)
AST: 23 IU/L (ref 0–40)
Albumin/Globulin Ratio: 1.8 (ref 1.2–2.2)
Albumin: 4.2 g/dL (ref 3.8–4.8)
Alkaline Phosphatase: 83 IU/L (ref 44–121)
BUN/Creatinine Ratio: 16 (ref 10–24)
BUN: 16 mg/dL (ref 8–27)
Bilirubin Total: 0.2 mg/dL (ref 0.0–1.2)
CO2: 23 mmol/L (ref 20–29)
Calcium: 9.6 mg/dL (ref 8.6–10.2)
Chloride: 106 mmol/L (ref 96–106)
Creatinine, Ser: 1 mg/dL (ref 0.76–1.27)
Globulin, Total: 2.3 g/dL (ref 1.5–4.5)
Glucose: 183 mg/dL — ABNORMAL HIGH (ref 70–99)
Potassium: 4.1 mmol/L (ref 3.5–5.2)
Sodium: 143 mmol/L (ref 134–144)
Total Protein: 6.5 g/dL (ref 6.0–8.5)
eGFR: 79 mL/min/{1.73_m2} (ref >59)

## 2022-11-09 LAB — LIPID PANEL
Chol/HDL Ratio: 2.2 ratio (ref 0.0–5.0)
Cholesterol, Total: 102 mg/dL (ref 100–199)
HDL: 46 mg/dL (ref >39)
LDL Chol Calc (NIH): 39 mg/dL (ref 0–99)
Triglycerides: 89 mg/dL (ref 0–149)
VLDL Cholesterol Cal: 17 mg/dL (ref 5–40)

## 2022-11-18 ENCOUNTER — Other Ambulatory Visit: Payer: Self-pay | Admitting: Family Medicine

## 2022-12-01 ENCOUNTER — Telehealth: Payer: Self-pay | Admitting: Family Medicine

## 2022-12-01 NOTE — Telephone Encounter (Signed)
Prescription Request  12/01/2022  LOV: 10/27/2022  What is the name of the medication or equipment? sildenafil (VIAGRA) 100 MG tablet  Have you contacted your pharmacy to request a refill? No   Which pharmacy would you like this sent to?   [id number] - [place] ([place]), [place] - [address] [place] ([place]) [place] [address] Phone: [phone] Fax: [phone]    Patient notified that their request is being sent to the clinical staff for review and that they should receive a response within 2 business days.   Please advise at Mobile [phone] (mobile)

## 2022-12-02 ENCOUNTER — Other Ambulatory Visit: Payer: Self-pay

## 2022-12-02 MED ORDER — SILDENAFIL CITRATE 100 MG PO TABS: 100.0000 mg | ORAL_TABLET | Freq: Every day | ORAL | 1 refills | Status: DC | PRN

## 2022-12-02 NOTE — Telephone Encounter (Signed)
Rx sent 

## 2022-12-16 ENCOUNTER — Other Ambulatory Visit: Payer: Self-pay | Admitting: Family Medicine

## 2022-12-25 ENCOUNTER — Telehealth: Payer: Self-pay | Admitting: Family Medicine

## 2022-12-25 NOTE — Telephone Encounter (Signed)
Call in Doxycycline 100 mg BID for 14 days  

## 2022-12-25 NOTE — Telephone Encounter (Signed)
Spouse called to request UTI Medication.  Pt was offered an OV.  Spouse stated Pt gets UTIs all the time and MD can just send Rx to pharmacy.  Please advise.

## 2022-12-26 DIAGNOSIS — E860 Dehydration: Secondary | ICD-10-CM | POA: Diagnosis not present

## 2022-12-26 DIAGNOSIS — R110 Nausea: Secondary | ICD-10-CM | POA: Diagnosis not present

## 2022-12-26 DIAGNOSIS — R319 Hematuria, unspecified: Secondary | ICD-10-CM | POA: Diagnosis not present

## 2022-12-26 DIAGNOSIS — N390 Urinary tract infection, site not specified: Secondary | ICD-10-CM | POA: Diagnosis not present

## 2022-12-29 ENCOUNTER — Other Ambulatory Visit: Payer: Self-pay

## 2022-12-29 ENCOUNTER — Ambulatory Visit: Payer: Medicare Other | Admitting: Family Medicine

## 2022-12-29 MED ORDER — DOXYCYCLINE HYCLATE 100 MG PO TABS: 100.0000 mg | ORAL_TABLET | Freq: Two times a day (BID) | ORAL | 0 refills | Status: DC

## 2022-12-29 NOTE — Telephone Encounter (Signed)
Rx sent to pt pharmacy, pt notified °

## 2023-01-05 ENCOUNTER — Ambulatory Visit: Payer: Medicare Other | Admitting: Family Medicine

## 2023-01-06 ENCOUNTER — Ambulatory Visit (INDEPENDENT_AMBULATORY_CARE_PROVIDER_SITE_OTHER): Payer: Medicare Other | Admitting: Family Medicine

## 2023-01-06 ENCOUNTER — Ambulatory Visit
Admission: RE | Admit: 2023-01-06 | Discharge: 2023-01-06 | Disposition: A | Discharge status: Home / Self Care | Payer: Medicare Other | Source: Ambulatory Visit | Attending: Family Medicine | Admitting: Family Medicine

## 2023-01-06 ENCOUNTER — Other Ambulatory Visit: Payer: Self-pay | Admitting: Family Medicine

## 2023-01-06 ENCOUNTER — Encounter: Payer: Self-pay | Admitting: Family Medicine

## 2023-01-06 VITALS — BP 120/70 | HR 71 | Temp 99.3°F | Wt 207.0 lb

## 2023-01-06 DIAGNOSIS — K8020 Calculus of gallbladder without cholecystitis without obstruction: Secondary | ICD-10-CM | POA: Diagnosis not present

## 2023-01-06 DIAGNOSIS — K5732 Diverticulitis of large intestine without perforation or abscess without bleeding: Secondary | ICD-10-CM | POA: Diagnosis not present

## 2023-01-06 DIAGNOSIS — R1032 Left lower quadrant pain: Secondary | ICD-10-CM | POA: Diagnosis not present

## 2023-01-06 DIAGNOSIS — I700 Atherosclerosis of aorta: Secondary | ICD-10-CM | POA: Diagnosis not present

## 2023-01-06 LAB — CBC WITH DIFFERENTIAL/PLATELET
Basophils Absolute: 0.1 10*3/uL (ref 0.0–0.1)
Basophils Relative: 0.4 % (ref 0.0–3.0)
Eosinophils Absolute: 0 10*3/uL (ref 0.0–0.7)
Eosinophils Relative: 0.3 % (ref 0.0–5.0)
HCT: 32.5 % — ABNORMAL LOW (ref 39.0–52.0)
Hemoglobin: 9.8 g/dL — ABNORMAL LOW (ref 13.0–17.0)
Lymphocytes Relative: 10.5 % — ABNORMAL LOW (ref 12.0–46.0)
Lymphs Abs: 1.5 10*3/uL (ref 0.7–4.0)
MCHC: 30 g/dL (ref 30.0–36.0)
MCV: 76.2 fl — ABNORMAL LOW (ref 78.0–100.0)
Monocytes Absolute: 1 10*3/uL (ref 0.1–1.0)
Monocytes Relative: 6.9 % (ref 3.0–12.0)
Neutro Abs: 11.5 10*3/uL — ABNORMAL HIGH (ref 1.4–7.7)
Neutrophils Relative %: 81.9 % — ABNORMAL HIGH (ref 43.0–77.0)
Platelets: 455 10*3/uL — ABNORMAL HIGH (ref 150.0–400.0)
RBC: 4.26 Mil/uL (ref 4.22–5.81)
RDW: 17.8 % — ABNORMAL HIGH (ref 11.5–15.5)
WBC: 14.1 10*3/uL — ABNORMAL HIGH (ref 4.0–10.5)

## 2023-01-06 LAB — POC URINALSYSI DIPSTICK (AUTOMATED)
Bilirubin, UA: NEGATIVE
Blood, UA: NEGATIVE
Glucose, UA: POSITIVE — AB
Ketones, UA: NEGATIVE
Leukocytes, UA: NEGATIVE
Nitrite, UA: NEGATIVE
Protein, UA: NEGATIVE
Spec Grav, UA: 1.015 (ref 1.010–1.025)
Urobilinogen, UA: 0.2 E.U./dL
pH, UA: 6.5 (ref 5.0–8.0)

## 2023-01-06 LAB — BASIC METABOLIC PANEL
BUN: 11 mg/dL (ref 6–23)
CO2: 29 mEq/L (ref 19–32)
Calcium: 9.7 mg/dL (ref 8.4–10.5)
Chloride: 104 mEq/L (ref 96–112)
Creatinine, Ser: 0.86 mg/dL (ref 0.40–1.50)
GFR: 86.05 mL/min (ref >60.00)
Glucose, Bld: 89 mg/dL (ref 70–99)
Potassium: 4.2 mEq/L (ref 3.5–5.1)
Sodium: 140 mEq/L (ref 135–145)

## 2023-01-06 MED ORDER — OXYCODONE-ACETAMINOPHEN 5-325 MG PO TABS: 1.0000 | ORAL_TABLET | ORAL | 0 refills | Status: DC | PRN

## 2023-01-06 MED ORDER — AMOXICILLIN-POT CLAVULANATE 875-125 MG PO TABS: 1.0000 | ORAL_TABLET | Freq: Two times a day (BID) | ORAL | 0 refills | Status: DC

## 2023-01-06 MED ORDER — IOPAMIDOL (ISOVUE-300) INJECTION 61%
100.0000 mL | Freq: Once | INTRAVENOUS | Status: AC | PRN
  Administered 2023-01-06: 100 mL via INTRAVENOUS

## 2023-01-06 MED ORDER — METRONIDAZOLE 500 MG PO TABS: 500.0000 mg | ORAL_TABLET | Freq: Three times a day (TID) | ORAL | 0 refills | Status: DC

## 2023-01-06 NOTE — Addendum Note (Signed)
Addended by: Carola Rhine on: 01/06/2023 10:44 AM   Modules accepted: Orders

## 2023-01-06 NOTE — Progress Notes (Signed)
   Subjective:    Patient ID: [name], male    DOB: 1950-11-03, 72 y.o.   MRN: [id number]  HPI Here for 6 days of LLQ abdominal pain and fever as high as 103.7 degrees. His Bms are normal. No urinary symptoms. No nausea. He went to an urgent care on 12-26-22, and they diagnosed him with a UTI. No culture was sent. They started him on Doxycycline, but this has not helped much. He is drinking fluids.    Review of Systems  Constitutional:  Positive for fever.  Respiratory: Negative.    Cardiovascular: Negative.   Gastrointestinal:  Positive for abdominal pain. Negative for abdominal distention, blood in stool, constipation, diarrhea, nausea and vomiting.  Genitourinary: Negative.   Musculoskeletal:  Positive for back pain.       Objective:   Physical Exam Constitutional:      Appearance: Normal appearance. He is not ill-appearing.  Cardiovascular:     Rate and Rhythm: Regular rhythm. Bradycardia present.     Pulses: Normal pulses.     Heart sounds: Normal heart sounds.  Pulmonary:     Effort: Pulmonary effort is normal.     Breath sounds: Normal breath sounds.  Abdominal:     General: Abdomen is flat. Bowel sounds are normal. There is no distension.     Palpations: Abdomen is soft. There is no mass.     Tenderness: There is abdominal tenderness. There is rebound. There is no guarding.     Hernia: No hernia is present.     Comments: Very tender in the LLQ and left flank  Neurological:     Mental Status: He is alert.           Assessment & Plan:  I believe this is diverticulitis and not a UTI. He will stop the Doxycycline, and he will start on Augmentin and Flagyl for 10 days. We will send his urine sample for a culture. Get a CBC and BMET today. We will set up a stat CT of the abdomen and pelvis. We spent a total of ( 33  ) minutes reviewing records and discussing these issues.  Gershon Crane, MD  Gershon Crane, MD

## 2023-01-07 LAB — URINE CULTURE
MICRO NUMBER:: 15158768
Result:: NO GROWTH
SPECIMEN QUALITY:: ADEQUATE

## 2023-01-11 DIAGNOSIS — M5442 Lumbago with sciatica, left side: Secondary | ICD-10-CM | POA: Diagnosis not present

## 2023-01-11 DIAGNOSIS — Z6831 Body mass index (BMI) 31.0-31.9, adult: Secondary | ICD-10-CM | POA: Diagnosis not present

## 2023-01-14 ENCOUNTER — Telehealth: Payer: Self-pay | Admitting: *Deleted

## 2023-01-14 NOTE — Telephone Encounter (Signed)
   Pre-operative Risk Assessment    Patient Name: [name]  DOB: 06/05/1951 MRN: [id number]      Request for Surgical Clearance    Procedure:   MYELOGRAM/POST MYELOGRAM CT SCAN  Date of Surgery:  Clearance TBD                                 Surgeon:  DR. KYLE CABBELL Surgeon's Group or Practice Name:  [facility] Phone number:  [phone] Fax number:  [phone] ATTN: JESSICA   Type of Clearance Requested:   - Medical  - Pharmacy:  Hold Clopidogrel (Plavix)     Type of Anesthesia:  None    Additional requests/questions:    Elpidio Anis   01/14/2023, 6:09 PM

## 2023-01-18 ENCOUNTER — Encounter: Payer: Self-pay | Admitting: Family Medicine

## 2023-01-18 ENCOUNTER — Ambulatory Visit (INDEPENDENT_AMBULATORY_CARE_PROVIDER_SITE_OTHER): Payer: Medicare Other | Admitting: Family Medicine

## 2023-01-18 VITALS — BP 110/60 | HR 66 | Temp 98.3°F | Wt 207.0 lb

## 2023-01-18 DIAGNOSIS — K5732 Diverticulitis of large intestine without perforation or abscess without bleeding: Secondary | ICD-10-CM | POA: Diagnosis not present

## 2023-01-18 DIAGNOSIS — N390 Urinary tract infection, site not specified: Secondary | ICD-10-CM | POA: Diagnosis not present

## 2023-01-18 MED ORDER — SULFAMETHOXAZOLE-TRIMETHOPRIM 800-160 MG PO TABS: 1.0000 | ORAL_TABLET | Freq: Two times a day (BID) | ORAL | 2 refills | Status: DC | PRN

## 2023-01-18 MED ORDER — LISINOPRIL 40 MG PO TABS: 20.0000 mg | ORAL_TABLET | Freq: Every day | ORAL | Status: DC

## 2023-01-18 NOTE — Progress Notes (Unsigned)
   Subjective:    Patient ID: [name], male    DOB: April 09, 1951, 72 y.o.   MRN: [id number]  HPI Here to follow up on diverticulitis. He saw [place] on 01-06-23 for LLQ abdominal pain that we diagnosed as diverticulitis. He took a 10 day course of Augmentin and Flagyl, and these symptoms have resolved. He feels great and his BM's are normal. He also asks for a refill of Bactrim to keep on hand for UTI symptoms.    Review of Systems  Constitutional: Negative.   Respiratory: Negative.    Cardiovascular: Negative.   Gastrointestinal: Negative.   Genitourinary: Negative.        Objective:   Physical Exam Constitutional:      Appearance: Normal appearance.  Cardiovascular:     Rate and Rhythm: Normal rate and regular rhythm.     Pulses: Normal pulses.     Heart sounds: Normal heart sounds.  Pulmonary:     Effort: Pulmonary effort is normal.     Breath sounds: Normal breath sounds.  Neurological:     Mental Status: He is alert.           Assessment & Plan:  He has recovered from a recent bout of diverticulitis. We will also refill the Bactrim DS so he can treat any early UTI symptoms.  Gershon Crane, MD

## 2023-01-20 ENCOUNTER — Telehealth: Payer: Self-pay

## 2023-01-20 NOTE — Telephone Encounter (Signed)
Patient scheduled 01/27/23 @9 :40 a.m., Med rec and consent given.

## 2023-01-20 NOTE — Telephone Encounter (Signed)
   Name: [name]  DOB: [birth year]  MRN: [id number]  Primary Cardiologist: Nicki Guadalajara, MD   Preoperative team, please contact this patient and set up a phone call appointment for further preoperative risk assessment. Please obtain consent and complete medication review. Thank you for your help.  I confirm that guidance regarding antiplatelet and oral anticoagulation therapy has been completed and, if necessary, noted below.  His Plavix may be held for 5 days prior to his procedure.  Please resume as soon as hemostasis is achieved.   Ronney Asters, NP 01/20/2023, 10:01 AM [facility]

## 2023-01-21 ENCOUNTER — Other Ambulatory Visit: Payer: Self-pay | Admitting: Cardiovascular Disease

## 2023-01-23 ENCOUNTER — Other Ambulatory Visit: Payer: Self-pay | Admitting: Family Medicine

## 2023-01-25 NOTE — Telephone Encounter (Signed)
Pt LOV was on 7/15/245 Last refill was done on 07/22/22 Please advise

## 2023-01-27 ENCOUNTER — Ambulatory Visit: Payer: Medicare Other | Attending: Internal Medicine | Admitting: Nurse Practitioner

## 2023-01-27 ENCOUNTER — Encounter: Payer: Self-pay | Admitting: Nurse Practitioner

## 2023-01-27 DIAGNOSIS — Z01810 Encounter for preprocedural cardiovascular examination: Secondary | ICD-10-CM

## 2023-01-27 NOTE — Progress Notes (Signed)
Virtual Visit via Telephone Note   Because of [name]'s co-morbid illnesses, he is at least at moderate risk for complications without adequate follow up.  This format is felt to be most appropriate for this patient at this time.  The patient did not have access to video technology/had technical difficulties with video requiring transitioning to audio format only (telephone).  All issues noted in this document were discussed and addressed.  No physical exam could be performed with this format.  Please refer to the patient's chart for his consent to telehealth for [facility]  Evaluation Performed:  Preoperative cardiovascular risk assessment _____________   Date:  01/27/2023   Patient ID:  [name] Dec 04, 1950, MRN [id number] Patient Location:  Home Provider location:   Office  Primary Care Provider:  Nelwyn Salisbury, MD Primary Cardiologist:  Nicki Guadalajara, MD  Chief Complaint / Patient Profile   72 y.o. y/o male with a h/o CAD s/p CABG 2002, type 2 DM, HTN, HLD, sinus bradycardia with repeat cardiac catheterization 03/15/2022 with patent grafts of sequential LIMA to diagonal and LAD and left radial to diagonal branch, PDA and PLA were occluded distally but these were small vessels, 75% mid AV groove circumflex stenosis with successful DES who is pending myelogram/postmyelogram CT scan and presents today for telephonic preoperative cardiovascular risk assessment.  History of Present Illness    [name] is a 72 y.o. male who presents via audio/video conferencing for a telehealth visit today.  Pt was last seen in cardiology clinic on 08/12/22 by Dr. Tresa Endo.  At that time [name] was doing well.  The patient is now pending procedure as outlined above. Since his last visit, he denies chest pain, shortness of breath, lower extremity edema, fatigue, palpitations, melena, hematuria, hemoptysis, diaphoresis, weakness, presyncope, syncope, orthopnea,  and PND. He is able to achieve > 4 METS activity without cardiac symptoms.    Past Medical History    Past Medical History:  Diagnosis Date   CAD (coronary artery disease)    nuclear stress test-09/09/11 low risk scan EF61%, sees Dr. Tresa Endo    Chronic headache    Chronic neck pain    Diabetes mellitus    sees Dr. Lonzo Cloud   Hyperlipidemia    Hypertension 12/26/2008   ECHO- EF>55%   Insomnia    Past Surgical History:  Procedure Laterality Date   APPENDECTOMY     cervical spine injection     COLONOSCOPY  04-02-06    per [facility], clear, repeat in 10 yrs   CORONARY ARTERY BYPASS GRAFT  2002   4 vessels   CORONARY PRESSURE/FFR STUDY N/A 01/12/2022   Procedure: INTRAVASCULAR PRESSURE WIRE/FFR STUDY;  Surgeon: Runell Gess, MD;  Location: [facility];  Service: Cardiovascular;  Laterality: N/A;   CORONARY STENT INTERVENTION N/A 01/12/2022   Procedure: CORONARY STENT INTERVENTION;  Surgeon: Runell Gess, MD;  Location: [facility];  Service: Cardiovascular;  Laterality: N/A;   LEFT HEART CATH AND CORS/GRAFTS ANGIOGRAPHY N/A 01/12/2022   Procedure: LEFT HEART CATH AND CORS/GRAFTS ANGIOGRAPHY;  Surgeon: Runell Gess, MD;  Location: [facility];  Service: Cardiovascular;  Laterality: N/A;    Allergies  Allergies  Allergen Reactions   Codeine Anaphylaxis    Pt. verbalized that he has taken and can take Hydrocodone, Oxycodone w/o issue - codeine allergy only   Ambien [Zolpidem Tartrate] Other (See Comments)    Headache  Crestor [Rosuvastatin]     Myalgia    Isordil [Isosorbide Nitrate] Other (See Comments)    Headaches   Lipitor [Atorvastatin] Other (See Comments)    Myalgia    Zetia [Ezetimibe] Other (See Comments)    Myalgia    Cipro [Ciprofloxacin Hcl] Hives and Rash   Victoza [Liraglutide] Rash    Home Medications    Prior to Admission medications   Medication Sig Start Date End Date Taking? Authorizing Provider  ACCU-CHEK GUIDE  test strip USE TO TEST BLOOD SUGAR EVERY DAY 08/11/22   Nelwyn Salisbury, MD  amLODipine (NORVASC) 5 MG tablet TAKE 1.5 TABLETS BY MOUTH DAILY. 08/17/22   Nelwyn Salisbury, MD  aspirin 81 MG tablet Take 81 mg by mouth daily.    [provider]  clonazePAM (KLONOPIN) 0.5 MG tablet TAKE 1 TABLET BY MOUTH 2 TIMES DAILY AS NEEDED FOR ANXIETY. 01/26/23   Nelwyn Salisbury, MD  clopidogrel (PLAVIX) 75 MG tablet TAKE 1 TABLET BY MOUTH EVERY DAY WITH BREAKFAST 10/16/22   Lennette Bihari, MD  Coenzyme Q10 (COQ10) 100 MG CAPS Take 100 mg by mouth daily.     [provider]  dapagliflozin propanediol (FARXIGA) 10 MG TABS tablet Take 1 tablet (10 mg total) by mouth daily. 10/12/22   Shamleffer, Konrad Dolores, MD  Evolocumab (REPATHA SURECLICK) 140 MG/ML SOAJ Inject 140 mg into the skin every 14 (fourteen) days. 08/24/22   Lennette Bihari, MD  gabapentin (NEURONTIN) 300 MG capsule Take 2 (600 mg) capsules by mouth twice daily 09/22/22   Nelwyn Salisbury, MD  glipiZIDE (GLUCOTROL) 5 MG tablet Take 2 tablets (10 mg total) by mouth daily before breakfast AND 1 tablet (5 mg total) daily before supper. 10/12/22   Shamleffer, Konrad Dolores, MD  lisinopril (ZESTRIL) 40 MG tablet TAKE 1 TABLET BY MOUTH EVERY DAY 01/21/23   Lennette Bihari, MD  MELATONIN GUMMIES PO Take 2 each by mouth at bedtime.    [provider]  nebivolol (BYSTOLIC) 2.5 MG tablet TAKE 1 TABLET BY MOUTH EVERY DAY 05/01/22   Lennette Bihari, MD  nitroGLYCERIN (NITROSTAT) 0.4 MG SL tablet Place 1 tablet (0.4 mg total) under the tongue every 5 (five) minutes as needed for chest pain. 08/12/22 11/10/22  Lennette Bihari, MD  Omega-3 350 MG CPDR Take 350 mg by mouth daily.    [provider]  oxyCODONE-acetaminophen (PERCOCET/ROXICET) 5-325 MG tablet Take 1 tablet by mouth every 4 (four) hours as needed for severe pain. 01/06/23   Nelwyn Salisbury, MD  pioglitazone (ACTOS) 45 MG tablet Take 1 tablet (45 mg total) by mouth daily. 10/12/22    Shamleffer, Konrad Dolores, MD  sildenafil (VIAGRA) 100 MG tablet Take 1 tablet (100 mg total) by mouth daily as needed for erectile dysfunction. 12/02/22   Nafziger, Kandee Keen, NP  sulfamethoxazole-trimethoprim (BACTRIM DS) 800-160 MG tablet Take 1 tablet by mouth 2 (two) times daily as needed (urine symptoms). 01/18/23   Nelwyn Salisbury, MD  tamsulosin (FLOMAX) 0.4 MG CAPS capsule Take 1 capsule (0.4 mg total) by mouth daily. 08/21/22   Nelwyn Salisbury, MD  traZODone (DESYREL) 50 MG tablet TAKE 1 TABLET BY MOUTH EVERYDAY AT BEDTIME 12/16/22   Nelwyn Salisbury, MD    Physical Exam    Vital Signs:  Avin Joyice Faster does not have vital signs available for review today.  Given telephonic nature of communication, physical exam is limited. AAOx3. NAD. Normal affect.  Speech and respirations  are unlabored.  Accessory Clinical Findings    None  Assessment & Plan    1.  Preoperative Cardiovascular Risk Assessment: According to the Revised Cardiac Risk Index (RCRI), his Perioperative Risk of Major Cardiac Event is (%): 0.9. His Functional Capacity in METs is: 7.59 according to the Duke Activity Status Index (DASI). The patient is doing well from a cardiac perspective. Therefore, based on ACC/AHA guidelines, the patient would be at acceptable risk for the planned procedure without further cardiovascular testing.    The patient was advised that if he develops new symptoms prior to surgery to contact our office to arrange for a follow-up visit, and he verbalized understanding.  Per office protocol, he may hold Plavix for 5 days prior to procedure and should resume as soon as hemodynamically stable postoperatively.  A copy of this note will be routed to requesting surgeon.  Time:   Today, I have spent 10 minutes with the patient with telehealth technology discussing medical history, symptoms, and management plan.    Levi Aland, NP-C  01/27/2023, 9:40 AM [address] Office  [phone] Fax [phone]

## 2023-02-15 ENCOUNTER — Telehealth: Payer: Self-pay | Admitting: Cardiovascular Disease

## 2023-02-15 NOTE — Telephone Encounter (Signed)
*  STAT* If patient is at the pharmacy, call can be transferred to refill team.   1. Which medications need to be refilled? (please list name of each medication and dose if known)   Evolocumab (REPATHA SURECLICK) 140 MG/ML SOAJ   2. Which pharmacy/location (including street and city if local pharmacy) is medication to be sent to?  [id number] [place], [place] - [address]    3. Do they need a 30 day or 90 day supply? 90   Patient is out of this medication.

## 2023-02-16 ENCOUNTER — Other Ambulatory Visit (HOSPITAL_COMMUNITY): Payer: Self-pay

## 2023-02-16 ENCOUNTER — Telehealth: Payer: Self-pay

## 2023-02-16 MED ORDER — REPATHA SURECLICK 140 MG/ML ~~LOC~~ SOAJ: 140.0000 mg | SUBCUTANEOUS | 3 refills | Status: DC

## 2023-02-16 NOTE — Telephone Encounter (Signed)
Pharmacy Patient Advocate Encounter   Received notification from CoverMyMeds that prior authorization for REPATHA is required/requested.   Insurance verification completed.   The patient is insured through [facility] [employer] .   Per test claim: PA required; PA submitted to [facility] via CoverMyMeds Key/confirmation #/EOC [id number]    Status is pending

## 2023-02-17 NOTE — Telephone Encounter (Signed)
Pharmacy Patient Advocate Encounter  Received notification from [facility] that Prior Authorization for REPATHA has been  APPROVED FROM 8.13.24 TO 8.13.25

## 2023-04-03 ENCOUNTER — Other Ambulatory Visit: Payer: Self-pay | Admitting: Internal Medicine

## 2023-04-04 ENCOUNTER — Other Ambulatory Visit: Payer: Self-pay | Admitting: Adult Health

## 2023-04-05 ENCOUNTER — Encounter: Payer: Self-pay | Admitting: Cardiovascular Disease

## 2023-04-05 ENCOUNTER — Ambulatory Visit: Payer: Medicare Other | Attending: Cardiovascular Disease | Admitting: Cardiovascular Disease

## 2023-04-05 DIAGNOSIS — I10 Essential (primary) hypertension: Secondary | ICD-10-CM

## 2023-04-05 DIAGNOSIS — Z789 Other specified health status: Secondary | ICD-10-CM

## 2023-04-05 DIAGNOSIS — I25709 Atherosclerosis of coronary artery bypass graft(s), unspecified, with unspecified angina pectoris: Secondary | ICD-10-CM | POA: Diagnosis not present

## 2023-04-05 DIAGNOSIS — Z951 Presence of aortocoronary bypass graft: Secondary | ICD-10-CM

## 2023-04-05 DIAGNOSIS — I495 Sick sinus syndrome: Secondary | ICD-10-CM

## 2023-04-05 DIAGNOSIS — E7841 Elevated Lipoprotein(a): Secondary | ICD-10-CM

## 2023-04-05 DIAGNOSIS — R011 Cardiac murmur, unspecified: Secondary | ICD-10-CM | POA: Diagnosis not present

## 2023-04-05 DIAGNOSIS — E785 Hyperlipidemia, unspecified: Secondary | ICD-10-CM

## 2023-04-05 NOTE — Progress Notes (Unsigned)
Patient ID: [name], male   DOB: 19-Oct-1950, 72 y.o.   MRN: [id number]       Primary M.D.: Dr. Gershon Crane  HPI: [name] is a 72 y.o. male presents to the office today for a 7 month follow up cardiology evaluation.   [name] has CAD and in June 2002 underwent CABG surgery by Dr. [name] with a sequential LIMA graft to the second diagonal and LAD, and sequential radial graft to the first and third diagonal branches. An echo Doppler study in 2010 showed normal systolic function with mild MR and trace TR.  A  nuclear perfusion study in March 2013 showed normal perfusion and function without scar or ischemia.  Additional problems include hypertension, type 2 diabetes mellitus, and hyperlipidemia. Remotely, he had worked as a Haematologist minor and in the past was some question of possible black lung. 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  In November 2015 he complained of a slight change in symptomatology with exertional shortness of breath and some arm weakness.  Prior to bypass surgery, he had never experienced chest pain but demonstrated exertional shortness of breath and arm discomfort.  He admitted to fatigue.  He denied any major episodes of chest pressure.  A 2-D echo Doppler study on 06/14/2014 showed an ejection fraction of 55-60% with mild focal basal hypertrophy of the septum; grade 1 diastolic dysfunction.  There was evidence for mild mitral regurgitation and moderate left atrial dilatation.  A nuclear perfusion study on December 11,2015 was interpreted as low risk.  He developed 1-2 mm of horizontal ST segment depression and had normal scintigraphic myocardial perfusion images.  Ejection fraction was 57%.  There was normal LV function without wall motion abnormality.  He has a history of hyperlipidemia and remotely self increased his simvastatin from 40 mg to 80 mg and developed occasional myalgias.   He has been on Bystolic 10 mg and lisinopril 30 mg for his hypertension.  He is on metformin 1000 mg twice a day as well as DiaBeta 5 mg for his type 2 diabetes mellitus.  When I saw him in October 2018, he had stopped taking Livalo for approximately one month.  He had been busy doing home projects and denied any recurrent chest pain.  During his last evaluation.  I had a lengthy discussion with him and recommended another attempt at a trial of Livalo.  I also added supplemental coenzyme Q 10.  Over this time period, he was able to reinitiate 2 mg daily and has been able to tolerate this without myalgias.  Subsequent blood work did show improvement in his lipid status on 08/04/2017 with a total cholesterol 149, LDL 82.  Triglycerides 148, and HDL 37.  He has continued to be on Januvia and metformin for his diabetes mellitus.  Hypertension.  He has remained on amlodipine 5 mg, lisinopril 30 mg, and Bystolic 10 mg.   When evaluated in March 2019 he was tolerating Livalo and I recommended further titration to 4 mg daily and attempt to obtain an LDL less than 70.  Since I saw him, he has had issues with poorly controlled diabetes mellitus.  Apparently he was started on farziga but developed some dizziness secondary to this.  He is now on metformin, Januvia, and was started on Actos.  Earlier this year he had a fall off a ladder.   I saw him in October 2019.  He required  rotator cuff surgery on his right shoulder.  His surgery was delayed due to initial glucose issues.  Since it is been over 17 years since his CABG revascularization, I recommended that he undergo a preoperative exercise Myoview study for preoperative assessment and clearance.  He underwent a nuclear perfusion study on May 05, 2018.  This was interpreted as low risk with normal perfusion and mildly reduced left ventricular global systolic function with an EF of 48%.  He developed horizontal ST segment depression of 1.5 mm during stress which  returned to baseline at 5 to 9 minutes into the recovery period.  When compared to his prior nuclear perfusion study of 2015 this was not significantly changed and on that study he also had normal perfusion with positive ECG changes.  A subsequent echo Doppler study on May 30, 2018 showed an EF of 55 to 60%.  There was severe LVH.  There was grade 1 diastolic dysfunction with mild MR and mild LA dilation.  He denies any chest pain.  He has had periods of increased cough following prolonged coughs he may feel somewhat atypical chest pain discomfort.  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 Kendrick Fries.  He had his pulmonary evaluation with Dr. 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  I saw him in March 2021.  Over the prior year he had remained stable from a cardiovascular standpoint.  He was not having anginal symptomatology 18-1/2 years following his CABG revascularization.  He developed Covid infection and was very weak and fatigued for 5 days.  In addition his son died in 19-Jun-2020after he developed a seizure while driving a motorcycle and was around 73 years old.  In addition, his sister died at age 81 secondary to cancer.  He continues to be on amlodipine 5 mg, isosorbide 30 mg, lisinopril 40 mg and Bystolic 10 mg for hypertension.  He is on Zetia and rosuvastatin 10 mg for hyperlipidemia in addition to omega-3 fatty acids.  He is diabetic on Metformin, Farxiga, and Actos.    He was  evaluated by me in August 2021.  Mr. Clauss has done  fairly well.  However, he admits to experiencing some episodes of chest pressure over the past 6 to 8 weeks.  Mostly this occurs without exertion and oftentimes occurs when he goes more in a reclining position when on his recliner chair.  However, he has noticed some vague symptomatology with walking.  However he walks his dogs regularly and often runs with them and denies any chest pressure when he is running with his dogs.  He does note some mild lightheadedness when he bends over and its up abruptly.  He is unaware of palpitations.  He underwent an echo Doppler study in February 05, 2020 which showed an EF of 55 to 60% and grade 2 diastolic dysfunction.  There was mild mitral gravitation, and mild aortic valve sclerosis.  The ascending aorta measured 38 mm.  Laboratory in April 2021 showed an LDL of 71 on Zetia and rosuvastatin.  He continues to  be on Metformin, Farxiga, and Actos for his diabetes mellitus.    He underwent a nuclear perfusion study for evaluation of his chest pain which was done on April 05, 2020.  This was low risk and showed an EF at 55%.  It was very mild ST depression at peak infusion without any definitive perfusion defect on nuclear imaging.  I saw him on August 15, 2020 at which time he felt well and denied any recurrent chest pain.  He continued  to be on amlodipine 5 mg, lisinopril 40 mg, isosorbide 30 mg, and Bystolic 5 mg for his hypertension and CAD.  He is diabetic on Farxiga, and glipizide in addition to Actos and recently he stopped taking Metformin.  He has felt improved off Metformin treatment.  He was on Zetia for hyperlipidemia.  He has not had recent laboratory.  During that evaluation, his blood pressure was elevated and I recommended slight titration of amlodipine from 5 mg up to 7.5 mg and recommended he continue his current regimen of isosorbide, lisinopril and nebivolol.  He was no longer on metformin but was on Farxiga in addition to glipizide and Actos for his  diabetes mellitus.  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  Recently, he has had recurrent problems with prostate infections and has been on antibiotics.  He recently has noticed some chest discomfort and shortness of breath.  He is unaware of any palpitations.  He apparently is no longer on isosorbide due to dizziness and is no longer on Zetia.  He had undergone laboratory on August 05, 2021 which showed total cholesterol 180, triglycerides 102, HDL 42 and LDL 117.  During that evaluation, I recommended titration of amlodipine to 10 mg and scheduled him for [facility] study for reassessment of his chest pain and potential ischemia.  I renewed his prescription for sublingual nitroglycerin and recommended reinstitution of Zetia.  I recommended follow-up laboratory.  He underwent a Lexiscan Myoview study on October 28, 2021 which showed EF at 44% with a small fixed defect in the basal inferior wall consistent with possible small prior infarction without associated ischemia.  I saw him on Nov 21, 2021 at which time he continued to feel well and denied chest pain or shortness of breath.    His back is better and he is now walking his dog 2-3 times per day.  He cannot tolerate statin therapy.  Recent lipid studies from Nov 10, 2021 showed total cholesterol 152 triglycerides 73 HDL 43 and LDL 95, improved from 125 in February 2022.  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  During that evaluation, with his statin intolerance and elevated LP(a) I referred him to our pharmacist for initiation of Repatha which should result in a potential 26 to 30% reduction.  Since I last saw him, he developed chest pain leading to an emergency room evaluation and admission on January 10, 2022.  He was evaluated by Dr. Flora Lipps.  He underwent  cardiac catheterization 03/15/2022 by Dr. Gery Pray.  And had patent grafts with a sequential LIMA to the diagonal and LAD and left  radial to diagonal branch.  The PDA and PLA were occluded distally but these were small vessels received collaterals from the distal circumflex.  Felt to have 75% mid AV groove circumflex stenosis and underwent successful DFR guided mediated.  Intervention with stenting with a 2.5 x 12 mm Synergy DES stent.  Subsequently, Mr.  Redford has felt well.  He denies any recurrent chest pain.  He is walking 2-3 times a day taking his dog for walks.  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  Remotely he had worked in [employer].  He denies any significant shortness of breath.  He is now on DAPT with aspirin/Plavix, for hypertension and CAD on amlodipine 5 mg, lisinopril 40 mg and nebivolol 2.5 mg daily.  He is diabetic on Farxiga, glipizide, and Actos.  He has restless legs on gabapentin.  During that evaluation, I recommended institution of PCSK9 inhibition particularly since in the past he was remotely found to have an elevated LP(a).  I last saw him on August 12, 2022.  Since his prior evaluation he was started on Praluent 75 mg by pharmacy.  He also was no longer taking Zetia and statins due to leg discomfort.  His wife also had been started on treatment as well.  At the end of the year, she had developed some issues and as result her PCSK9 in addition was discontinued.  Apparently for some reason Mr. Gilday was discontinued as well.  Presently, he denies any chest pain or shortness of breath.  He continues to see Dr. Jeannett Senior 5 for primary care.  He continues to be on amlodipine 5 mg, lisinopril 40 mg, and nebivolol 2.5 mg daily for hypertension.  He is on Farxiga, glipizide and Actos for his diabetes mellitus.  He continues to be on DAPT with aspirin/Plavix.  That evaluation, I was able to work with pharmacy and reinstitute PCSK9 inhibition.  Presently, Mr. Alire feels well.  He has issues with left hip discomfort and has some sciatica discomfort.  He denies any chest pain.  He  continues to be on amlodipine 5 mg, lisinopril 40 mg, and Bystolic 2.5 mg daily for hypertension.  He is now on Repatha every 2-week injection and takes over-the-counter omega-3 fatty acid.  He is diabetic on glipizide in addition to pioglitazone.  He has been on long-term DAPT with aspirin/Plavix and denies any recurrent anginal symptomatology.  Repeat lipid studies on Nov 09, 2022 were excellent with total cholesterol 102, HDL 46, LDL 39, triglycerides 89.  Hemoglobin A1c in April 2024 was increased at 7.8.  Creatinine 0.86.  He presents for evaluation.  Past Medical History:  Diagnosis Date   CAD (coronary artery disease)    nuclear stress test-09/09/11 low risk scan EF61%, sees Dr. Tresa Endo    Chronic headache    Chronic neck pain    Diabetes mellitus    sees Dr. Lonzo Cloud   Hyperlipidemia    Hypertension 12/26/2008   ECHO- EF>55%   Insomnia     Past Surgical History:  Procedure Laterality Date   APPENDECTOMY     cervical spine injection     COLONOSCOPY  04-02-06    per [facility], clear, repeat in 10 yrs   CORONARY ARTERY BYPASS GRAFT  2002   4 vessels   CORONARY PRESSURE/FFR STUDY N/A 01/12/2022   Procedure: INTRAVASCULAR PRESSURE WIRE/FFR STUDY;  Surgeon: Runell Gess, MD;  Location: [facility] INVASIVE CV LAB;  Service: Cardiovascular;  Laterality: N/A;   CORONARY STENT INTERVENTION N/A 01/12/2022   Procedure: CORONARY STENT INTERVENTION;  Surgeon: Runell Gess, MD;  Location: [facility] INVASIVE CV LAB;  Service: Cardiovascular;  Laterality: N/A;   LEFT HEART CATH AND CORS/GRAFTS ANGIOGRAPHY N/A 01/12/2022   Procedure: LEFT HEART CATH AND CORS/GRAFTS ANGIOGRAPHY;  Surgeon: Runell Gess, MD;  Location: [facility]  INVASIVE CV LAB;  Service: Cardiovascular;  Laterality: N/A;    Allergies  Allergen Reactions   Codeine Anaphylaxis    Pt. verbalized that he has taken and can take Hydrocodone, Oxycodone w/o issue - codeine allergy only   Ambien [Zolpidem Tartrate] Other (See Comments)     Headache    Crestor [Rosuvastatin]     Myalgia    Isordil [Isosorbide Nitrate] Other (See Comments)    Headaches   Lipitor [Atorvastatin] Other (See Comments)    Myalgia    Zetia [Ezetimibe] Other (See Comments)    Myalgia    Cipro [Ciprofloxacin Hcl] Hives and Rash   Victoza [Liraglutide] Rash    Current Outpatient Medications  Medication Sig Dispense Refill   ACCU-CHEK GUIDE test strip USE TO TEST BLOOD SUGAR EVERY DAY 100 strip 3   amLODipine (NORVASC) 5 MG tablet TAKE 1.5 TABLETS BY MOUTH DAILY. 135 tablet 3   aspirin 81 MG tablet Take 81 mg by mouth daily.     clonazePAM (KLONOPIN) 0.5 MG tablet TAKE 1 TABLET BY MOUTH 2 TIMES DAILY AS NEEDED FOR ANXIETY. 60 tablet 5   clopidogrel (PLAVIX) 75 MG tablet TAKE 1 TABLET BY MOUTH EVERY DAY WITH BREAKFAST 90 tablet 3   Coenzyme Q10 (COQ10) 100 MG CAPS Take 100 mg by mouth daily.      dapagliflozin propanediol (FARXIGA) 10 MG TABS tablet Take 1 tablet (10 mg total) by mouth daily. 90 tablet 3   Evolocumab (REPATHA SURECLICK) 140 MG/ML SOAJ Inject 140 mg into the skin every 14 (fourteen) days. 6 mL 3   gabapentin (NEURONTIN) 300 MG capsule Take 2 (600 mg) capsules by mouth twice daily 120 capsule 5   glipiZIDE (GLUCOTROL) 5 MG tablet Take 2 tablets (10 mg total) by mouth daily before breakfast AND 1 tablet (5 mg total) daily before supper. 270 tablet 3   lisinopril (ZESTRIL) 40 MG tablet TAKE 1 TABLET BY MOUTH EVERY DAY 90 tablet 2   MELATONIN GUMMIES PO Take 2 each by mouth at bedtime.     nebivolol (BYSTOLIC) 2.5 MG tablet TAKE 1 TABLET BY MOUTH EVERY DAY 90 tablet 3   Omega-3 350 MG CPDR Take 350 mg by mouth daily.     pioglitazone (ACTOS) 45 MG tablet Take 1 tablet (45 mg total) by mouth daily. 90 tablet 3   sildenafil (VIAGRA) 100 MG tablet Take 1 tablet (100 mg total) by mouth daily as needed for erectile dysfunction. 10 tablet 1   sulfamethoxazole-trimethoprim (BACTRIM DS) 800-160 MG tablet Take 1 tablet by mouth 2 (two) times daily  as needed (urine symptoms). 60 tablet 2   tamsulosin (FLOMAX) 0.4 MG CAPS capsule Take 1 capsule (0.4 mg total) by mouth daily. 90 capsule 3   traZODone (DESYREL) 50 MG tablet TAKE 1 TABLET BY MOUTH EVERYDAY AT BEDTIME 90 tablet 2   nitroGLYCERIN (NITROSTAT) 0.4 MG SL tablet Place 1 tablet (0.4 mg total) under the tongue every 5 (five) minutes as needed for chest pain. 25 tablet 6   oxyCODONE-acetaminophen (PERCOCET/ROXICET) 5-325 MG tablet Take 1 tablet by mouth every 4 (four) hours as needed for severe pain. (Patient not taking: Reported on 04/05/2023) 30 tablet 0   No current facility-administered medications for this visit.    Social History   Socioeconomic History   Marital status: Married    Spouse name: Not on file   Number of children: Not on file   Years of education: Not on file   Highest education level: Not on  file  Occupational History   Not on file  Tobacco Use   Smoking status: Former    Current packs/day: 0.00    Average packs/day: 0.3 packs/day for 10.0 years (2.5 ttl pk-yrs)    Types: Cigarettes    Start date: 11/25/1960    Quit date: 11/26/1970    Years since quitting: 52.3   Smokeless tobacco: Never  Vaping Use   Vaping status: Never Used  Substance and Sexual Activity   Alcohol use: No    Alcohol/week: 0.0 standard drinks of alcohol   Drug use: No   Sexual activity: Yes  Other Topics Concern   Not on file  Social History Narrative   Not on file   Social Determinants of Health   Financial Resource Strain: Low Risk  (10/13/2021)   Overall Financial Resource Strain (CARDIA)    Difficulty of Paying Living Expenses: Not hard at all  Food Insecurity: No Food Insecurity (10/13/2021)   Hunger Vital Sign    Worried About Running Out of Food in the Last Year: Never true    Ran Out of Food in the Last Year: Never true  Transportation Needs: No Transportation Needs (10/13/2021)   PRAPARE - Administrator, Civil Service (Medical): No    Lack of  Transportation (Non-Medical): No  Physical Activity: Sufficiently Active (10/13/2021)   Exercise Vital Sign    Days of Exercise per Week: 5 days    Minutes of Exercise per Session: 30 min  Stress: No Stress Concern Present (10/13/2021)   [employer] of Occupational Health - Occupational Stress Questionnaire    Feeling of Stress : Not at all  Social Connections: Socially Integrated (10/13/2021)   Social Connection and Isolation Panel [NHANES]    Frequency of Communication with Friends and Family: More than three times a week    Frequency of Social Gatherings with Friends and Family: More than three times a week    Attends Religious Services: More than 4 times per year    Active Member of [employer] or Organizations: Yes    Attends Engineer, structural: More than 4 times per year    Marital Status: Married  Catering manager Violence: Not At Risk (10/13/2021)   Humiliation, Afraid, Rape, and Kick questionnaire    Fear of Current or Ex-Partner: No    Emotionally Abused: No    Physically Abused: No    Sexually Abused: No    Family History  Problem Relation Age of Onset   Liver cancer Father    Cancer Brother    Cancer Brother    Cancer Brother    Coronary artery disease Other    Hypertension Other    Heart disease Mother    Colon cancer Neg Hx    Stomach cancer Neg Hx    Esophageal cancer Neg Hx    Socially he is married. He has 3 children 8 grandchildren and 4 great-grandchildren. There is no tobacco or alcohol use. He is now retired.   ROS General: Negative; No fevers, chills, or night sweats;  HEENT: Decrease in hearing ,No changes in vision  sinus congestion, difficulty swallowing Pulmonary: History of "black lung" Cardiovascular: See history of present illness GI: Negative; No nausea, vomiting, diarrhea, or abdominal pain GU: Negative; No dysuria, hematuria, or difficulty voiding Musculoskeletal: Negative; no myalgias, joint pain, or weakness Hematologic/Oncology:  Negative; no easy bruising, bleeding Endocrine: Positive for diabetes mellitus Neuro: Positive for paresthesias in his legs. Skin: Negative; No rashes or skin lesions Psychiatric:  Negative; No behavioral problems, depression Sleep: Positive for restless legs; No snoring, daytime sleepiness, hypersomnolence, bruxism, hypnogognic hallucinations, no cataplexy Other comprehensive 14 point system review is negative.   PE BP 116/72   Pulse (!) 59   Ht 5\' 9"  (1.753 m)   Wt 216 lb 12.8 oz (98.3 kg)   SpO2 93%   BMI 32.02 kg/m   Repeat blood pressure by me was 126/76.  Wt Readings from Last 3 Encounters:  04/05/23 216 lb 12.8 oz (98.3 kg)  01/18/23 207 lb (93.9 kg)  01/06/23 207 lb (93.9 kg)      Physical Exam BP 116/72   Pulse (!) 59   Ht 5\' 9"  (1.753 m)   Wt 216 lb 12.8 oz (98.3 kg)   SpO2 93%   BMI 32.02 kg/m  General: Alert, oriented, no distress.  Skin: normal turgor, no rashes, warm and dry HEENT: Normocephalic, atraumatic. Pupils equal round and reactive to light; sclera anicteric; extraocular muscles intact;  Nose without nasal septal hypertrophy Mouth/Parynx benign; Mallinpatti scale 3 Neck: No JVD, no carotid bruits; normal carotid upstroke Lungs: clear to ausculatation and percussion; no wheezing or rales Chest wall: without tenderness to palpitation Heart: PMI not displaced, RRR, s1 s2 normal, 1/6 systolic murmur, no diastolic murmur, no rubs, gallops, thrills, or heaves Abdomen: soft, nontender; no hepatosplenomehaly, BS+; abdominal aorta nontender and not dilated by palpation. Back: no CVA tenderness Pulses 2+ Musculoskeletal: full range of motion, normal strength, no joint deformities Extremities: no clubbing cyanosis or edema, Homan's sign negative  Neurologic: grossly nonfocal; Cranial nerves grossly wnl Psychologic: Normal mood and affect  EKG Interpretation Date/Time:  Monday April 05 2023 15:30:49 EDT Ventricular Rate:  59 PR Interval:  168 QRS  Duration:  96 QT Interval:  486 QTC Calculation: 481 R Axis:   58  Text Interpretation: Sinus bradycardia Prolonged QT When compared with ECG of 13-Jan-2022 06:02, T wave inversion no longer evident in Inferior leads Nonspecific T wave abnormality no longer evident in Lateral leads Confirmed by Nicki Guadalajara ([id number]) on 04/05/2023 4:30:40 PM     August 12, 2022 ECG (independently read by me): Sinus bradycardia at 56 bpm.  No ectopy.  Normal intervals  I personally reviewed his ECG from January 14, 2022 which showed sinus rhythm at 78.  There are inferior Q waves with T wave inversion in 3 and aVF  Nov 21, 2021 ECG (independently read by me): Sinus bradycardia at 53,small Q III,no ectopy   October 20, 2021 ECG (independently read by me): Sinus bradycardia at 53; small Q III t wave abnormality III, aVF  February 2022 ECG (independently read by me): Sinus Bradycardia at 57; early transition  August 2021 ECG (independently read by me): Sinus bradycardia 57 bpm.  Early transition.  No ectopy.  No ST segment changes.  March 2021 ECG (independently read by me): Sinus bradycardia 59 bpm.  Nonspecific ST abnormality.  Normal intervals.  No ectopy.  December 2019 ECG (independently read by me): Normal sinus rhythm at 61 bpm.  Early transition.  No ectopy.  No ST segment changes.  October 2019 ECG (independently read by me): Sinus bradycardia 53 bpm.  Early transition.  No ectopy.  March 2019 ECG (independently read by me): Sinus bradycardia 59 bpm.  Early transition.  Normal intervals.  October 2018 ECG (independently read by me): Normal sinus rhythm at 60 bpm.  Nonspecific T wave abnormality.  Normal intervals.  No ectopy.  May 2018 ECG (independently read by me): Sinus bradycardia  59 bpm.  No significant ST-T changes.  Early transition.  April 2017 ECG (independently read by me): Sinus bradycardia at 53 bpm.  Q waves in III and F.  March 2016 ECG (independently read by me): Normal sinus rhythm at  67.  No ectopy.  November 2015 ECG (independently read by me): Normal sinus rhythm at 65 bpm.  Early transition.  No significant ST segment changes.  Normal intervals.  Prior ECG: Normal sinus rhythm at 58 beats per minute. QTc interval 428 ms. PR interval 170 ms. Nonspecific ST changes   LABS:     Latest Ref Rng & Units 01/06/2023   10:33 AM 11/09/2022    8:34 AM 08/10/2022    8:46 AM  BMP  Glucose 70 - 99 mg/dL 89  161  096   BUN 6 - 23 mg/dL 11  16  15    Creatinine 0.40 - 1.50 mg/dL 0.45  4.09  8.11   BUN/Creat Ratio 10 - 24  16  15    Sodium 135 - 145 mEq/L 140  143  139   Potassium 3.5 - 5.1 mEq/L 4.2  4.1  4.5   Chloride 96 - 112 mEq/L 104  106  103   CO2 19 - 32 mEq/L 29  23  25    Calcium 8.4 - 10.5 mg/dL 9.7  9.6  9.7        Latest Ref Rng & Units 11/09/2022    8:34 AM 08/10/2022    8:46 AM 11/10/2021    8:55 AM  Hepatic Function  Total Protein 6.0 - 8.5 g/dL 6.5  7.0  6.8   Albumin 3.8 - 4.8 g/dL 4.2  4.4  4.4   AST 0 - 40 IU/L 23  23  25    ALT 0 - 44 IU/L 20  20  21    Alk Phosphatase 44 - 121 IU/L 83  96  93   Total Bilirubin 0.0 - 1.2 mg/dL 0.2  <9.1  0.2        Latest Ref Rng & Units 01/06/2023   10:33 AM 08/10/2022    8:46 AM 01/13/2022    4:31 AM  CBC  WBC 4.0 - 10.5 K/uL 14.1  4.9  12.3   Hemoglobin 13.0 - 17.0 g/dL 9.8  47.8  29.5   Hematocrit 39.0 - 52.0 % 32.5  36.8  34.5   Platelets 150.0 - 400.0 K/uL 455.0  282  181    Lab Results  Component Value Date   MCV 76.2 (L) 01/06/2023   MCV 81 08/10/2022   MCV 83.1 01/13/2022    Lab Results  Component Value Date   TSH 2.550 08/10/2022   Lab Results  Component Value Date   HGBA1C 7.8 (A) 10/12/2022    BNP No results found for: "PROBNP"  Lipid Panel     Component Value Date/Time   CHOL 102 11/09/2022 0834   CHOL 173 05/24/2013 1049   TRIG 89 11/09/2022 0834   TRIG 185 (H) 05/24/2013 1049   HDL 46 11/09/2022 0834   HDL 39 (L) 05/24/2013 1049   CHOLHDL 2.2 11/09/2022 0834   CHOLHDL 2.4 01/11/2022  0029   VLDL 11 01/11/2022 0029   LDLCALC 39 11/09/2022 0834   LDLCALC 84 11/22/2017 0846   LDLCALC 97 05/24/2013 1049     RADIOLOGY: No results found.  LEXISCAN MYOVIEW 04/05/2020 Study Highlights    The left ventricular ejection fraction is normal (55-65%). Nuclear stress EF: 55%. Horizontal ST segment depression ST segment depression  was noted during stress. No T wave inversion was noted during stress. The study is normal. This is a low risk study.   Similar to prior nuclear stress tests, there were very mild st depressions at peak infusion, but there is no clear perfusion defect seen on imaging. Normal EF.   Lexiscan Myoview: October 27, 2021   Findings are consistent with prior myocardial infarction. The study is intermediate risk.   No ST deviation was noted.   LV perfusion is abnormal. Defect 1: There is a small defect with moderate reduction in uptake present in the basal inferior location(s) that is fixed. There is abnormal wall motion in the defect area. Consistent with infarction. Rotating planar data shows a linear artifact across the diaphragm which may account for inferior wall perfusion artifact, however in the setting of abnormal wall motion, may represent true infarction.   Left ventricular function is abnormal. Nuclear stress EF: 44 %. The left ventricular ejection fraction is moderately decreased (30-44%). End diastolic cavity size is moderately enlarged.   Prior study available for comparison from 04/05/2020. There are changes compared to prior study which appear to be new. The left ventricular ejection fraction has decreased.    CARDIAC CATH/PCI: 01/12/2022   Ost Cx to Prox Cx lesion is 40% stenosed.   Mid Cx lesion is 75% stenosed.   3rd Mrg lesion is 50% stenosed.   Dist RCA lesion is 100% stenosed with 100% stenosed side branch in RPAV.   RPDA lesion is 100% stenosed.   Mid LAD lesion is 100% stenosed.   A drug-eluting stent was successfully placed.   Post  intervention, there is a 0% residual stenosis.   IMPRESSION: Successful DFR guided mid AV groove circumflex PCI and drug-eluting stenting.  The sequential LIMA to the diagonal and LAD were patent as was the left radial to a diagonal branch.  The PDA and PLA were occluded distally but these appear to be small and received collaterals from the distal circumflex.  After reviewing the films with Dr. Katrinka Blazing our consensus opinion was to intervene on the circumflex if it was physiologically significant and to reserve PDA/PLA intervention if he has ongoing chest pain.  He will need DAPT uninterrupted for at least 12 months.  The sheath will be removed once ACT falls below and 70 and pressure held.  He will be hydrated overnight.  He left the lab in stable condition.    Intervention  IMPRESSION:  1. Tachycardia-bradycardia syndrome [facility])      ASSESSMENT AND PLAN: Mr. Diamond Nickel is a 72 year-old Caucasian male who underwent CABG revascularization surgery by Dr. [name] in November 2002.  Remotely he had noticed mild shortness of breath and fatigue and never experienced classic substernal chest pressure.  A nuclear perfusion study in 2015 continued to show normal perfusion but he developed ST segment changes of 1-2 mm.  Amlodipine was added to his medical regimen and since institution he felt improved.   A 4-year follow-up nuclear stress test in 2019 in anticipation of potential future shoulder surgery was unchanged from previously and continued to show normal perfusion but again he developed asymptomatic ST depression which lasted 5 to 9 minutes into the recovery period for resolution.  He never had a shoulder surgery.  At his evaluation in August 2021 he was experiencing some episodes of chest discomfort.  A follow-up nuclear imaging study on April 05, 2020 was unchanged from his prior evaluation and remain low risk.  His most recent [employer]  study from October 23, 2021 suggested a small basal  inferior region of scar with EF slightly reduced compared to previously.  Due to recurrent chest pain, repeat cardiac catheterization on 01/12/2022 revealed 75% stenosis in the AV groove circumflex with patent grafts and with previously noted distal see occlusion in the PDA and PL vessel.  He underwent DFR guided PCI to the mid AV groove circumflex with insertion of a 2.5 x 12 mm Synergy stent placed by Dr. Allyson Sabal.  Mr. Charlett Nose continues to be asymptomatic with reference to chest pain.  Since his last evaluation with me, he is now back on PCSK9 inhibition with Repatha 140 mg every 2 weeks in addition to his over-the-counter omega-3 fatty acid.  Lipid studies were from Nov 09, 2022 were markedly improved with total cholesterol 102, HDL 46, LDL 39 and triglycerides 89.  Blood pressure today is stable on amlodipine 5 mg, lisinopril 40 mg, and nebivolol 2.5 mg daily.  ECG shows sinus bradycardia at 59 bpm.  He is diabetic on glipizide in addition to pioglitazone.  He has been having issues with left hip discomfort and sciatica discomfort.  His last echo Doppler study was in July 2023 which showed EF normal at 60 to 65%, mild LVH, grade 1 diastolic dysfunction with aortic sclerosis.  March April 2025 I have recommended a 2-year follow-up echo evaluation.  He continues to be on long-term DAPT with aspirin/Plavix.  I will see him in April 2025 for reevaluation.    Lennette Bihari, MD, [facility]  04/05/2023 4:30 PM

## 2023-04-05 NOTE — Patient Instructions (Signed)
Medication Instructions:  No medication changes *If you need a refill on your cardiac medications before your next appointment, please call your pharmacy*   Lab Work: No labs If you have labs (blood work) drawn today and your tests are completely normal, you will receive your results only by: MyChart Message (if you have MyChart) OR A paper copy in the mail If you have any lab test that is abnormal or we need to change your treatment, we will call you to review the results.   Testing/Procedures: Your physician has requested that you have an echocardiogram March 2025. Echocardiography is a painless test that uses sound waves to create images of your heart. It provides your doctor with information about the size and shape of your heart and how well your heart's chambers and valves are working. This procedure takes approximately one hour. There are no restrictions for this procedure. Please do NOT wear cologne, perfume, aftershave, or lotions (deodorant is allowed).  Please arrive 15 minutes prior to your appointment time.    Follow-Up: At [facility], you and your health needs are our priority.  As part of our continuing mission to provide you with exceptional heart care, we have created designated Provider Care Teams.  These Care Teams include your primary Cardiologist (physician) and Advanced Practice Providers (APPs -  Physician Assistants and Nurse Practitioners) who all work together to provide you with the care you need, when you need it.  We recommend signing up for the patient portal called "MyChart".  Sign up information is provided on this After Visit Summary.  MyChart is used to connect with patients for Virtual Visits (Telemedicine).  Patients are able to view lab/test results, encounter notes, upcoming appointments, etc.  Non-urgent messages can be sent to your provider as well.   To learn more about what you can do with MyChart, go to [email or web address].    Your  next appointment:   6 month(s)  Provider:   Nicki Guadalajara, MD

## 2023-04-06 ENCOUNTER — Other Ambulatory Visit: Payer: Self-pay | Admitting: Internal Medicine

## 2023-04-07 ENCOUNTER — Telehealth: Payer: Self-pay | Admitting: Family Medicine

## 2023-04-07 ENCOUNTER — Encounter: Payer: Self-pay | Admitting: Cardiovascular Disease

## 2023-04-07 NOTE — Telephone Encounter (Signed)
Prescription Request  04/07/2023  LOV: 01/18/2023  What is the name of the medication or equipment? traZODone (DESYREL) 50 MG tablet  Have you contacted your pharmacy to request a refill? No   Which pharmacy would you like this sent to?  [id number] [place], [place] - [address] [place] [address] Phone: [phone] Fax: [phone]     Patient notified that their request is being sent to the clinical staff for review and that they should receive a response within 2 business days.   Please advise at Mobile [phone] (mobile)

## 2023-04-07 NOTE — Telephone Encounter (Signed)
[facility] with [facility] at [facility] as the Rx was sent on 6/12 for #90 with 2 refills.  Dorene Grebe stated the patient picked up #90 on September 9th and this cannot be filled until December.  Patient was informed of this and stated he cannot sleep, took 2 last night and finally got back to sleep.  Asked if he can take 1.5 tablets? Message sent to PCP.Marland Kitchen

## 2023-04-08 MED ORDER — TRAZODONE HCL 100 MG PO TABS: 100.0000 mg | ORAL_TABLET | Freq: Every day | ORAL | 3 refills | Status: DC

## 2023-04-08 NOTE — Telephone Encounter (Signed)
I sent in a new RX for the 100 mg size to take one every night

## 2023-04-08 NOTE — Telephone Encounter (Signed)
Patient informed of the message below.

## 2023-04-08 NOTE — Addendum Note (Signed)
Addended by: Gershon Crane A on: 04/08/2023 12:48 PM   Modules accepted: Orders

## 2023-04-14 NOTE — Telephone Encounter (Signed)
Pt's spouse called to F/U on this refill request. Please send refill at your earliest convenience.

## 2023-04-19 ENCOUNTER — Other Ambulatory Visit: Payer: Self-pay

## 2023-04-19 ENCOUNTER — Telehealth: Payer: Self-pay | Admitting: Family Medicine

## 2023-04-19 MED ORDER — SILDENAFIL CITRATE 100 MG PO TABS: 100.0000 mg | ORAL_TABLET | Freq: Every day | ORAL | 1 refills | Status: AC | PRN

## 2023-04-19 NOTE — Telephone Encounter (Signed)
Pt Rx sent as requested

## 2023-04-19 NOTE — Telephone Encounter (Signed)
Pt's wife called back requesting to get the Sildinafil (Viagra) filled, stated she called last week.  Patient is completely out and needs a refill.    Pharmacy for this prescription only is for [facility] [address]

## 2023-04-20 ENCOUNTER — Encounter: Payer: Self-pay | Admitting: Internal Medicine

## 2023-04-20 ENCOUNTER — Ambulatory Visit (INDEPENDENT_AMBULATORY_CARE_PROVIDER_SITE_OTHER): Payer: Medicare Other | Admitting: Internal Medicine

## 2023-04-20 VITALS — BP 122/68 | HR 61 | Ht 69.0 in | Wt 216.0 lb

## 2023-04-20 DIAGNOSIS — E1142 Type 2 diabetes mellitus with diabetic polyneuropathy: Secondary | ICD-10-CM | POA: Diagnosis not present

## 2023-04-20 DIAGNOSIS — E1159 Type 2 diabetes mellitus with other circulatory complications: Secondary | ICD-10-CM

## 2023-04-20 DIAGNOSIS — E1165 Type 2 diabetes mellitus with hyperglycemia: Secondary | ICD-10-CM | POA: Diagnosis not present

## 2023-04-20 DIAGNOSIS — Z7984 Long term (current) use of oral hypoglycemic drugs: Secondary | ICD-10-CM

## 2023-04-20 LAB — POCT GLYCOSYLATED HEMOGLOBIN (HGB A1C): Hemoglobin A1C: 7.2 % — AB (ref 4.0–5.6)

## 2023-04-20 LAB — POCT GLUCOSE (DEVICE FOR HOME USE): Glucose Fasting, POC: 141 mg/dL — AB (ref 70–99)

## 2023-04-20 MED ORDER — PIOGLITAZONE HCL 45 MG PO TABS: 45.0000 mg | ORAL_TABLET | Freq: Every day | ORAL | 3 refills | Status: DC

## 2023-04-20 MED ORDER — DAPAGLIFLOZIN PROPANEDIOL 10 MG PO TABS: 10.0000 mg | ORAL_TABLET | Freq: Every day | ORAL | 3 refills | Status: DC

## 2023-04-20 MED ORDER — FREESTYLE LIBRE 3 SENSOR MISC: 1.0000 | 3 refills | Status: DC

## 2023-04-20 MED ORDER — GLIPIZIDE 5 MG PO TABS: 5.0000 mg | ORAL_TABLET | Freq: Every day | ORAL | 3 refills | Status: DC

## 2023-04-20 NOTE — Patient Instructions (Addendum)
  A1c 7.2%, Keep Up the Good Work !   - Continue  Glipizide 5 mg, 1 tablet before Supper  - Continue Actos 45 mg, 1 tablet  daily  - Continue Farxiga 10 mg once a day     HOW TO TREAT LOW BLOOD SUGARS (Blood sugar LESS THAN 70 MG/DL) Please follow the RULE OF 15 for the treatment of hypoglycemia treatment (when your (blood sugars are less than 70 mg/dL)   STEP 1: Take 15 grams of carbohydrates when your blood sugar is low, which includes:  3-4 GLUCOSE TABS  OR 3-4 OZ OF JUICE OR REGULAR SODA OR ONE TUBE OF GLUCOSE GEL    STEP 2: RECHECK blood sugar in 15 MINUTES STEP 3: If your blood sugar is still low at the 15 minute recheck --> then, go back to STEP 1 and treat AGAIN with another 15 grams of carbohydrates.

## 2023-04-20 NOTE — Progress Notes (Signed)
Name: [name]   Age/ Sex: 72 y.o. male   MRN/ DOB: [id number]/ August 17, 1950     PCP: Nelwyn Salisbury, MD   Reason for Endocrinology Evaluation: Type 2 Diabetes Mellitus  Initial Endocrine Consultative Visit: 08/31/2018    PATIENT IDENTIFIER: [name] is a 72 y.o. male with a past medical history of T2DM,CAD (S/P CABG). The patient has followed with Endocrinology clinic since 08/31/2018 for consultative assistance with management of his diabetes.  DIABETIC HISTORY:  [name] was diagnosed with T2DM in 2010, he has been on Metformin and Actos for many years, he is intolerant to Victoza due to rash. Januvia has been ineffective , which he stopped 08/2018. He has never been on insulin . His hemoglobin A1c has ranged from 6.1% in 2017, peaking at 10.4% in 2019  On his initial presentation his A1c was 8.5% . He was on Metformin and Actos . We added Farxiga   Glipizide started 04/2020 Self stopped Metformin due to diarrhea 09/2020  SUBJECTIVE:   During the last visit (10/12/2022): A1c 7.8%.       Today (04/20/2023): [name] is here for a  follow up visit on his diabetes.  He is accompanied by spouse today.  He checks his sugar occasionally. He did not bring his meter today. He denies hypoglycemia since his last visit of here   He continues to follow-up with cardiology for CAD, s/p PCI 01/2022 for unstable angina He does endorse dizziness that has been attributed to anemia during the day, but the patient has switched his glycemic agents to nighttime, which has noted improvement in dizziness..  Denies nausea or  vomiting  Denies constipation or diarrhea    HOME DIABETES REGIMEN:  Actos 45 mg daily  Farxiga 10 mg daily Glipizide 5 mg , 2 tabs before Breakfast and 1 tab before Supper    GLUCOSE LOG : Did not bring     DIABETIC COMPLICATIONS: Microvascular complications:  Neuropathy  Denies: ckd , retinopathy  Last eye exam: Completed  2023    Macrovascular complications:  CAD ( S/p CABG) , S/P PCI 01/2022 Denies: PVD, CVA      HISTORY:  Past Medical History:  Past Medical History:  Diagnosis Date   CAD (coronary artery disease)    nuclear stress test-09/09/11 low risk scan EF61%, sees Dr. Tresa Endo    Chronic headache    Chronic neck pain    Diabetes mellitus    sees Dr. Lonzo Cloud   Hyperlipidemia    Hypertension 12/26/2008   ECHO- EF>55%   Insomnia    Past Surgical History:  Past Surgical History:  Procedure Laterality Date   APPENDECTOMY     cervical spine injection     COLONOSCOPY  04-02-06    per [facility], clear, repeat in 10 yrs   CORONARY ARTERY BYPASS GRAFT  2002   4 vessels   CORONARY PRESSURE/FFR STUDY N/A 01/12/2022   Procedure: INTRAVASCULAR PRESSURE WIRE/FFR STUDY;  Surgeon: Runell Gess, MD;  Location: [facility];  Service: Cardiovascular;  Laterality: N/A;   CORONARY STENT INTERVENTION N/A 01/12/2022   Procedure: CORONARY STENT INTERVENTION;  Surgeon: Runell Gess, MD;  Location: [facility];  Service: Cardiovascular;  Laterality: N/A;   LEFT HEART CATH AND CORS/GRAFTS ANGIOGRAPHY N/A 01/12/2022   Procedure: LEFT HEART CATH AND CORS/GRAFTS ANGIOGRAPHY;  Surgeon: Runell Gess, MD;  Location: [facility];  Service: Cardiovascular;  Laterality: N/A;  Social History:  reports that he quit smoking about 52 years ago. His smoking use included cigarettes. He started smoking about 62 years ago. He has a 2.5 pack-year smoking history. He has never used smokeless tobacco. He reports that he does not drink alcohol and does not use drugs. Family History:  Family History  Problem Relation Age of Onset   Liver cancer Father    Cancer Brother    Cancer Brother    Cancer Brother    Coronary artery disease Other    Hypertension Other    Heart disease Mother    Colon cancer Neg Hx    Stomach cancer Neg Hx    Esophageal cancer Neg Hx      HOME  MEDICATIONS: Allergies as of 04/20/2023       Reactions   Codeine Anaphylaxis   Pt. verbalized that he has taken and can take Hydrocodone, Oxycodone w/o issue - codeine allergy only   Ambien [zolpidem Tartrate] Other (See Comments)   Headache    Crestor [rosuvastatin]    Myalgia    Isordil [isosorbide Nitrate] Other (See Comments)   Headaches   Lipitor [atorvastatin] Other (See Comments)   Myalgia    Zetia [ezetimibe] Other (See Comments)   Myalgia    Cipro [ciprofloxacin Hcl] Hives, Rash   Victoza [liraglutide] Rash        Medication List        Accurate as of April 20, 2023  7:37 AM. If you have any questions, ask your nurse or doctor.          Accu-Chek Guide test strip Generic drug: glucose blood USE TO TEST BLOOD SUGAR EVERY DAY   amLODipine 5 MG tablet Commonly known as: NORVASC TAKE 1.5 TABLETS BY MOUTH DAILY.   aspirin 81 MG tablet Take 81 mg by mouth daily.   clonazePAM 0.5 MG tablet Commonly known as: KLONOPIN TAKE 1 TABLET BY MOUTH 2 TIMES DAILY AS NEEDED FOR ANXIETY.   clopidogrel 75 MG tablet Commonly known as: PLAVIX TAKE 1 TABLET BY MOUTH EVERY DAY WITH BREAKFAST   CoQ10 100 MG Caps Take 100 mg by mouth daily.   dapagliflozin propanediol 10 MG Tabs tablet Commonly known as: Farxiga Take 1 tablet (10 mg total) by mouth daily.   gabapentin 300 MG capsule Commonly known as: NEURONTIN Take 2 (600 mg) capsules by mouth twice daily   glipiZIDE 5 MG tablet Commonly known as: GLUCOTROL Take 2 tablets (10 mg total) by mouth daily before breakfast AND 1 tablet (5 mg total) daily before supper.   lisinopril 40 MG tablet Commonly known as: ZESTRIL TAKE 1 TABLET BY MOUTH EVERY DAY   MELATONIN GUMMIES PO Take 2 each by mouth at bedtime.   nebivolol 2.5 MG tablet Commonly known as: BYSTOLIC TAKE 1 TABLET BY MOUTH EVERY DAY   nitroGLYCERIN 0.4 MG SL tablet Commonly known as: NITROSTAT Place 1 tablet (0.4 mg total) under the tongue every  5 (five) minutes as needed for chest pain.   Omega-3 350 MG Cpdr Take 350 mg by mouth daily.   pioglitazone 45 MG tablet Commonly known as: ACTOS Take 1 tablet (45 mg total) by mouth daily.   Repatha SureClick 140 MG/ML Soaj Generic drug: Evolocumab Inject 140 mg into the skin every 14 (fourteen) days.   sildenafil 100 MG tablet Commonly known as: Viagra Take 1 tablet (100 mg total) by mouth daily as needed for erectile dysfunction.   sulfamethoxazole-trimethoprim 800-160 MG tablet Commonly known as: BACTRIM DS  Take 1 tablet by mouth 2 (two) times daily as needed (urine symptoms).   tamsulosin 0.4 MG Caps capsule Commonly known as: FLOMAX Take 1 capsule (0.4 mg total) by mouth daily.   traZODone 100 MG tablet Commonly known as: DESYREL Take 1 tablet (100 mg total) by mouth at bedtime.       PHYSICAL EXAM: VS: BP 122/68 (BP Location: Right Arm, Patient Position: Sitting, Cuff Size: Small)   Pulse 61   Ht 5\' 9"  (1.753 m)   Wt 216 lb (98 kg)   SpO2 97%   BMI 31.90 kg/m    EXAM: General: Pt appears well and is in NAD  Lungs: Clear with good BS bilat with no rales, rhonchi, or wheezes  Heart: Auscultation: RRR with normal S1 and S2, no gallops or murmurs  Abdomen: Normoactive bowel sounds, soft, nontender, without masses or organomegaly palpable  Extremities: BL LE: no pretibial edema normal ROM and strength, no joint enlargement or tenderness  Mental Status: Judgment, insight: intact Mood and affect: no depression, anxiety, or agitation     DM Foot Exam 10/12/2022 The skin of the feet is intact without sores or ulcerations. The pedal pulses are 1+ on right and 1+ on left. The sensation is intact to a screening 5.07, 10 gram monofilament bilaterally   DATA REVIEWED:  Lab Results  Component Value Date   HGBA1C 7.2 (A) 04/20/2023   HGBA1C 7.8 (A) 10/12/2022   HGBA1C 7.6 (A) 03/23/2022    Latest Reference Range & Units 01/06/23 10:33  Sodium 135 - 145 mEq/L  140  Potassium 3.5 - 5.1 mEq/L 4.2  Chloride 96 - 112 mEq/L 104  CO2 19 - 32 mEq/L 29  Glucose 70 - 99 mg/dL 89  BUN 6 - 23 mg/dL 11  Creatinine 6.96 - 2.95 mg/dL 2.84  Calcium 8.4 - 13.2 mg/dL 9.7  GFR >44.01 mL/min 86.05    Latest Reference Range & Units 11/09/22 08:34  Total CHOL/HDL Ratio 0.0 - 5.0 ratio 2.2  Cholesterol, Total 100 - 199 mg/dL 027  HDL Cholesterol >25 mg/dL 46  Triglycerides 0 - 366 mg/dL 89  VLDL Cholesterol Cal 5 - 40 mg/dL 17  LDL Chol Calc (NIH) 0 - 99 mg/dL 39    ASSESSMENT / PLAN / RECOMMENDATIONS:   1) Type 2 Diabetes Mellitus, Sub-Optimally Controlled , With neuropathic and macrovascular complications - Most recent A1c of 7.2  %. Goal A1c < 7.0 %.     -A1c has been trending down -Due to dizziness he has been taking his glycemic agents in the evening around 8 PM, he has noted improved dizziness which has been partly attributed to anemia, I question hypoglycemia as well, but there is no confirmation due to lack of glucose data. -Patient encouraged to check glucose when feeling dizzy -Instead of taking 3 tablets of glipizide daily, he has been taking 1 tablet after dinner, I have encouraged him to try to take it before dinner -A prescription for freestyle libre 3 has been sent to the pharmacy    MEDICATIONS: Take glipizide 5 mg , 1 tablet before supper Continue Farxiga 10 mg daily Continue Actos 45 mg daily   EDUCATION / INSTRUCTIONS: BG monitoring instructions: Patient is instructed to check his blood sugars 1 times a day, fasting . Call  Endocrinology clinic if: BG persistently < 70  I reviewed the Rule of 15 for the treatment of hypoglycemia in detail with the patient. Literature supplied.    2) Diabetic complications:  Eye:  Does not have known diabetic retinopathy.  Neuro/ Feet: Does have known diabetic peripheral neuropathy. Renal: Patient does not have known baseline CKD. He is on an ACEI/ARB at present.    F/U in 6 months     Signed electronically by: Lyndle Herrlich, MD  [facility] Endocrinology  [facility] [address] [place], [place] [address] Phone: [phone] FAX: [phone]   CC: Nelwyn Salisbury, MD [address] [place] [address] Phone: [phone]  Fax: [phone]  Return to Endocrinology clinic as below: Future Appointments  Date Time Provider Department Center  09/21/2023  7:15 AM [id number]

## 2023-05-05 ENCOUNTER — Other Ambulatory Visit: Payer: Self-pay | Admitting: Cardiovascular Disease

## 2023-05-05 ENCOUNTER — Other Ambulatory Visit: Payer: Self-pay | Admitting: Family Medicine

## 2023-05-05 NOTE — Telephone Encounter (Signed)
Please advise if ok to send refill

## 2023-05-21 ENCOUNTER — Encounter: Payer: Self-pay | Admitting: Family Medicine

## 2023-05-21 ENCOUNTER — Ambulatory Visit (INDEPENDENT_AMBULATORY_CARE_PROVIDER_SITE_OTHER): Payer: Medicare Other | Admitting: Family Medicine

## 2023-05-21 VITALS — BP 110/60 | HR 55 | Temp 98.2°F | Wt 222.8 lb

## 2023-05-21 DIAGNOSIS — K921 Melena: Secondary | ICD-10-CM

## 2023-05-21 DIAGNOSIS — D649 Anemia, unspecified: Secondary | ICD-10-CM | POA: Diagnosis not present

## 2023-05-21 LAB — CBC WITH DIFFERENTIAL/PLATELET
Basophils Absolute: 0 10*3/uL (ref 0.0–0.1)
Basophils Relative: 1 % (ref 0.0–3.0)
Eosinophils Absolute: 0 10*3/uL (ref 0.0–0.7)
Eosinophils Relative: 0.6 % (ref 0.0–5.0)
HCT: 28.6 % — ABNORMAL LOW (ref 39.0–52.0)
Hemoglobin: 8.5 g/dL — ABNORMAL LOW (ref 13.0–17.0)
Lymphocytes Relative: 25.3 % (ref 12.0–46.0)
Lymphs Abs: 1.2 10*3/uL (ref 0.7–4.0)
MCHC: 29.7 g/dL — ABNORMAL LOW (ref 30.0–36.0)
MCV: 72 fL — ABNORMAL LOW (ref 78.0–100.0)
Monocytes Absolute: 0.5 10*3/uL (ref 0.1–1.0)
Monocytes Relative: 11.3 % (ref 3.0–12.0)
Neutro Abs: 3 10*3/uL (ref 1.4–7.7)
Neutrophils Relative %: 61.8 % (ref 43.0–77.0)
Platelets: 174 10*3/uL (ref 150.0–400.0)
RBC: 3.98 Mil/uL — ABNORMAL LOW (ref 4.22–5.81)
RDW: 18.1 % — ABNORMAL HIGH (ref 11.5–15.5)
WBC: 4.8 10*3/uL (ref 4.0–10.5)

## 2023-05-21 LAB — IBC + FERRITIN
Ferritin: 5.6 ng/mL — ABNORMAL LOW (ref 22.0–322.0)
Iron: 11 ug/dL — ABNORMAL LOW (ref 42–165)
Saturation Ratios: 2 % — ABNORMAL LOW (ref 20.0–50.0)
TIBC: 551.6 ug/dL — ABNORMAL HIGH (ref 250.0–450.0)
Transferrin: 394 mg/dL — ABNORMAL HIGH (ref 212.0–360.0)

## 2023-05-21 LAB — VITAMIN B12: Vitamin B-12: 92 pg/mL — ABNORMAL LOW (ref 211–911)

## 2023-05-21 LAB — FOLATE: Folate: >24.2 ng/mL (ref >5.9)

## 2023-05-21 NOTE — Progress Notes (Signed)
   Subjective:    Patient ID: [name], male    DOB: Jul 09, 1950, 72 y.o.   MRN: [id number]  HPI Here to follow up on an abnormal CBC from 01-06-23. This was drawn while he was being treated for diverticulitis, and this likely explains the elevated WBC and platelet counts. However he was anemic, and we did not have a good explanation for that. The WBC was 14.1 with 81.9% neutrophils. The platelets were at 455 K. The Hgb was 9.8 with an MCV of 76.2. The diverticulitis resolved and he has had no abdominal issues since then. However he feels tired all the time, and his wife says he craves eating ice.    Review of Systems  Constitutional:  Positive for fatigue. Negative for fever.  Respiratory: Negative.    Cardiovascular: Negative.   Gastrointestinal: Negative.        Objective:   Physical Exam Constitutional:      Appearance: Normal appearance.  Cardiovascular:     Rate and Rhythm: Normal rate and regular rhythm.     Pulses: Normal pulses.     Heart sounds: Normal heart sounds.  Pulmonary:     Effort: Pulmonary effort is normal.     Breath sounds: Normal breath sounds.  Neurological:     Mental Status: He is alert.           Assessment & Plan:  Follow up of anemia and leukocytosis. We will check a CBC today along with iron, ferritin, folate, and B12.  Gershon Crane, MD

## 2023-05-24 ENCOUNTER — Other Ambulatory Visit: Payer: Self-pay

## 2023-05-24 ENCOUNTER — Other Ambulatory Visit: Payer: Self-pay | Admitting: Internal Medicine

## 2023-05-24 ENCOUNTER — Other Ambulatory Visit: Payer: Self-pay | Admitting: Family Medicine

## 2023-05-24 DIAGNOSIS — D649 Anemia, unspecified: Secondary | ICD-10-CM

## 2023-05-24 MED ORDER — IRON (FERROUS SULFATE) 325 (65 FE) MG PO TABS: 325.0000 mg | ORAL_TABLET | Freq: Two times a day (BID) | ORAL | 5 refills | Status: DC

## 2023-05-26 ENCOUNTER — Other Ambulatory Visit: Payer: Medicare Other

## 2023-05-26 ENCOUNTER — Ambulatory Visit (INDEPENDENT_AMBULATORY_CARE_PROVIDER_SITE_OTHER): Payer: Medicare Other

## 2023-05-26 DIAGNOSIS — E538 Deficiency of other specified B group vitamins: Secondary | ICD-10-CM | POA: Diagnosis not present

## 2023-05-26 MED ORDER — CYANOCOBALAMIN 1000 MCG/ML IJ SOLN
1000.0000 ug | Freq: Once | INTRAMUSCULAR | Status: AC
  Administered 2023-05-26: 1000 ug via INTRAMUSCULAR

## 2023-05-26 NOTE — Progress Notes (Signed)
Per orders of Dr. Clent Ridges, injection of B12 given by Vickii Chafe on Right Deltoid. Patient tolerated injection well.

## 2023-05-31 ENCOUNTER — Other Ambulatory Visit (INDEPENDENT_AMBULATORY_CARE_PROVIDER_SITE_OTHER): Payer: Medicare Other

## 2023-05-31 ENCOUNTER — Telehealth: Payer: Self-pay

## 2023-05-31 DIAGNOSIS — D649 Anemia, unspecified: Secondary | ICD-10-CM

## 2023-05-31 LAB — FECAL OCCULT BLOOD, IMMUNOCHEMICAL: Fecal Occult Bld: POSITIVE — AB

## 2023-05-31 NOTE — Telephone Encounter (Signed)
Message sent to PCP for advise ?

## 2023-05-31 NOTE — Addendum Note (Signed)
Addended by: Gershon Crane A on: 05/31/2023 04:47 PM   Modules accepted: Orders

## 2023-06-01 ENCOUNTER — Ambulatory Visit (INDEPENDENT_AMBULATORY_CARE_PROVIDER_SITE_OTHER): Payer: Medicare Other

## 2023-06-01 DIAGNOSIS — E538 Deficiency of other specified B group vitamins: Secondary | ICD-10-CM | POA: Diagnosis not present

## 2023-06-01 MED ORDER — CYANOCOBALAMIN 1000 MCG/ML IJ SOLN
1000.0000 ug | Freq: Once | INTRAMUSCULAR | Status: AC
  Administered 2023-06-01: 1000 ug via INTRAMUSCULAR

## 2023-06-01 NOTE — Progress Notes (Signed)
Pt here for monthly B12 injection per Dr Clent Ridges   B12 given IM and pt tolerated injection well.  Next B12 injection scheduled for 06/08/23

## 2023-06-01 NOTE — Telephone Encounter (Signed)
Pt's spouse returned call. Says to call her because he is in meetings today

## 2023-06-01 NOTE — Telephone Encounter (Signed)
Reviewed stool card results with pt, verbalized understanding

## 2023-06-08 ENCOUNTER — Ambulatory Visit (INDEPENDENT_AMBULATORY_CARE_PROVIDER_SITE_OTHER): Payer: Medicare Other

## 2023-06-08 DIAGNOSIS — E538 Deficiency of other specified B group vitamins: Secondary | ICD-10-CM | POA: Diagnosis not present

## 2023-06-08 MED ORDER — CYANOCOBALAMIN 1000 MCG/ML IJ SOLN
1000.0000 ug | Freq: Once | INTRAMUSCULAR | Status: AC
  Administered 2023-06-08: 1000 ug via INTRAMUSCULAR

## 2023-06-08 NOTE — Progress Notes (Signed)
Pt here for monthly B12 injection per Dr Clent Ridges  B12 given IM and pt tolerated injection well.  Next B12 injection scheduled for 06/15/23

## 2023-06-14 ENCOUNTER — Ambulatory Visit (INDEPENDENT_AMBULATORY_CARE_PROVIDER_SITE_OTHER): Payer: Medicare Other

## 2023-06-14 DIAGNOSIS — E538 Deficiency of other specified B group vitamins: Secondary | ICD-10-CM

## 2023-06-14 MED ORDER — CYANOCOBALAMIN 1000 MCG/ML IJ SOLN
1000.0000 ug | Freq: Once | INTRAMUSCULAR | Status: AC
  Administered 2023-06-14: 1000 ug via INTRAMUSCULAR

## 2023-06-14 NOTE — Progress Notes (Signed)
Pt here for monthly B12 injection per Dr Clent Ridges  B12 given IM and pt tolerated injection well.  Next B12 injection scheduled for 06/22/23

## 2023-06-15 ENCOUNTER — Ambulatory Visit: Payer: Medicare Other

## 2023-06-16 ENCOUNTER — Encounter: Payer: Self-pay | Admitting: Gastroenterology

## 2023-06-22 ENCOUNTER — Ambulatory Visit (INDEPENDENT_AMBULATORY_CARE_PROVIDER_SITE_OTHER): Payer: Medicare Other

## 2023-06-22 DIAGNOSIS — E538 Deficiency of other specified B group vitamins: Secondary | ICD-10-CM

## 2023-06-22 MED ORDER — CYANOCOBALAMIN 1000 MCG/ML IJ SOLN
1000.0000 ug | Freq: Once | INTRAMUSCULAR | Status: AC
  Administered 2023-06-22: 1000 ug via INTRAMUSCULAR

## 2023-06-22 NOTE — Progress Notes (Signed)
Per orders of Dr. Clent Ridges, injection of B12 given by Stann Ore on Right Deltoid. Patient tolerated injection well.

## 2023-07-01 ENCOUNTER — Ambulatory Visit: Payer: Medicare Other

## 2023-07-01 ENCOUNTER — Telehealth: Payer: Self-pay | Admitting: Family Medicine

## 2023-07-01 DIAGNOSIS — E538 Deficiency of other specified B group vitamins: Secondary | ICD-10-CM | POA: Diagnosis not present

## 2023-07-01 MED ORDER — CYANOCOBALAMIN 1000 MCG/ML IJ SOLN
1000.0000 ug | Freq: Once | INTRAMUSCULAR | Status: AC
  Administered 2023-07-01: 1000 ug via INTRAMUSCULAR

## 2023-07-01 NOTE — Progress Notes (Signed)
Per orders of Dr. Fry, injection of Cyanocobalamin 1000 mcg given by Foy Mungia L Remi Lopata. °Patient tolerated injection well.  °

## 2023-07-01 NOTE — Telephone Encounter (Signed)
Spoke with pt stated that when he checked his blood glucose at home blood squirted from the finger, advised that pt is on Plavix which is a blood thinner, Advised to not prick finger too deep and to do it on the side of the finger.Advised to contact the office if this keep happening.

## 2023-07-01 NOTE — Telephone Encounter (Signed)
Patient came in for a nurse visit, informed nurse that when he pricked his finger to test his sugar and squeezed the finger patient stated that blood squirted from the finger.  Please advise patient at [phone].

## 2023-07-01 NOTE — Telephone Encounter (Signed)
Copied from CRM [id number]. Topic: Appointments - Scheduling Inquiry for Clinic >> Jul 01, 2023  8:02 AM Adele Barthel wrote: Reason for CRM: Pt is scheduled to received B12 shot tomm 12/27. His wife called in to see if he could possibly come in to receive it today since they returned from out of town earlier. She requests call back, #[phone]  **spoke with patient's spouse and advised her there were no openings for nurse visits. Says we will see patient tomorrow for regularly scheduled appointment

## 2023-07-06 ENCOUNTER — Ambulatory Visit (INDEPENDENT_AMBULATORY_CARE_PROVIDER_SITE_OTHER): Payer: Medicare Other

## 2023-07-06 DIAGNOSIS — E538 Deficiency of other specified B group vitamins: Secondary | ICD-10-CM

## 2023-07-06 MED ORDER — CYANOCOBALAMIN 1000 MCG/ML IJ SOLN
1000.0000 ug | Freq: Once | INTRAMUSCULAR | Status: AC
  Administered 2023-07-06: 1000 ug via INTRAMUSCULAR

## 2023-07-06 NOTE — Progress Notes (Signed)
Pt here for monthly B12 injection per Dr Clent Ridges  B12 given IM and pt tolerated injection well.  Next B12 injection scheduled for 07/13/23

## 2023-07-13 ENCOUNTER — Ambulatory Visit (INDEPENDENT_AMBULATORY_CARE_PROVIDER_SITE_OTHER): Payer: Medicare PPO

## 2023-07-13 DIAGNOSIS — E538 Deficiency of other specified B group vitamins: Secondary | ICD-10-CM

## 2023-07-13 MED ORDER — CYANOCOBALAMIN 1000 MCG/ML IJ SOLN
1000.0000 ug | Freq: Once | INTRAMUSCULAR | Status: AC
  Administered 2023-07-13: 1000 ug via INTRAMUSCULAR

## 2023-07-13 NOTE — Progress Notes (Signed)
 Pt here for monthly B12 injection per Dr Clent Ridges  B12 given IM and pt tolerated injection well.  Next B12 injection scheduled for 07/20/23

## 2023-07-20 ENCOUNTER — Ambulatory Visit: Payer: Medicare PPO

## 2023-07-20 DIAGNOSIS — E538 Deficiency of other specified B group vitamins: Secondary | ICD-10-CM | POA: Diagnosis not present

## 2023-07-20 MED ORDER — CYANOCOBALAMIN 1000 MCG/ML IJ SOLN
1000.0000 ug | Freq: Once | INTRAMUSCULAR | Status: AC
  Administered 2023-07-20: 1000 ug via INTRAMUSCULAR

## 2023-07-20 NOTE — Progress Notes (Signed)
Per orders of Dr. Clent Ridges, injection of B12 given by Vickii Chafe on Right Deltoid. Patient tolerated injection well.

## 2023-07-27 ENCOUNTER — Ambulatory Visit (INDEPENDENT_AMBULATORY_CARE_PROVIDER_SITE_OTHER): Payer: Medicare PPO

## 2023-07-27 DIAGNOSIS — E538 Deficiency of other specified B group vitamins: Secondary | ICD-10-CM | POA: Diagnosis not present

## 2023-07-27 MED ORDER — CYANOCOBALAMIN 1000 MCG/ML IJ SOLN
1000.0000 ug | Freq: Once | INTRAMUSCULAR | Status: AC
  Administered 2023-07-27: 1000 ug via INTRAMUSCULAR

## 2023-07-27 NOTE — Progress Notes (Signed)
Pt here for monthly B12 injection per Dr Clent Ridges  B12 given IM and pt tolerated injection well.  Next B12 injection scheduled for 08/03/23

## 2023-07-30 ENCOUNTER — Other Ambulatory Visit: Payer: Self-pay | Admitting: Family Medicine

## 2023-07-30 NOTE — Telephone Encounter (Signed)
Pt LOV was on 05/21/23 Last refill was done on 01/26/23 Please advise

## 2023-08-03 ENCOUNTER — Ambulatory Visit (INDEPENDENT_AMBULATORY_CARE_PROVIDER_SITE_OTHER): Payer: Medicare PPO

## 2023-08-03 DIAGNOSIS — E538 Deficiency of other specified B group vitamins: Secondary | ICD-10-CM | POA: Diagnosis not present

## 2023-08-03 MED ORDER — CYANOCOBALAMIN 1000 MCG/ML IJ SOLN
1000.0000 ug | Freq: Once | INTRAMUSCULAR | Status: AC
  Administered 2023-08-03: 1000 ug via INTRAMUSCULAR

## 2023-08-03 NOTE — Progress Notes (Signed)
Pt here for monthly B12 injection per Dr Clent Ridges  B12 given IM and pt tolerated injection well.  Next B12 injection scheduled for 08/10/23

## 2023-08-10 ENCOUNTER — Ambulatory Visit (INDEPENDENT_AMBULATORY_CARE_PROVIDER_SITE_OTHER): Payer: Medicare Other

## 2023-08-10 DIAGNOSIS — E538 Deficiency of other specified B group vitamins: Secondary | ICD-10-CM

## 2023-08-10 MED ORDER — CYANOCOBALAMIN 1000 MCG/ML IJ SOLN
1000.0000 ug | Freq: Once | INTRAMUSCULAR | Status: AC
  Administered 2023-08-10: 1000 ug via INTRAMUSCULAR

## 2023-08-10 NOTE — Progress Notes (Signed)
Pt here for monthly B12 injection per Dr Clent Ridges  B12 given IM and pt tolerated injection well.  Next B12 injection scheduled for 08/17/23

## 2023-08-12 ENCOUNTER — Telehealth: Payer: Self-pay | Admitting: Pharmacy Technician

## 2023-08-12 ENCOUNTER — Other Ambulatory Visit (HOSPITAL_COMMUNITY): Payer: Self-pay

## 2023-08-12 NOTE — Telephone Encounter (Addendum)
 Pharmacy Patient Advocate Encounter   Received notification from Fax that prior authorization for Repatha  SureClick 140MG /ML auto-injectors is required/requested.   Insurance verification completed.   The patient is insured through [facility] .   Per test claim: PA required; PA submitted to above mentioned insurance via CoverMyMeds Key/confirmation #/EOC [facility] Status is pending    Patient has new ins so needs a new pa

## 2023-08-12 NOTE — Telephone Encounter (Signed)
 Pharmacy Patient Advocate Encounter  Received notification from [facility] that Prior Authorization for Repatha  SureClick 140MG /ML auto-injectors has been APPROVED from 08/12/23 to 08/11/24. Spoke to pharmacy to process.Copay is $47.00.    PA #/Case ID/Reference #: [id number]

## 2023-08-17 ENCOUNTER — Ambulatory Visit: Payer: Medicare Other

## 2023-08-17 ENCOUNTER — Ambulatory Visit (INDEPENDENT_AMBULATORY_CARE_PROVIDER_SITE_OTHER): Payer: Medicare Other | Admitting: *Deleted

## 2023-08-17 DIAGNOSIS — E538 Deficiency of other specified B group vitamins: Secondary | ICD-10-CM

## 2023-08-17 MED ORDER — CYANOCOBALAMIN 1000 MCG/ML IJ SOLN
1000.0000 ug | Freq: Once | INTRAMUSCULAR | Status: AC
  Administered 2023-08-17: 1000 ug via INTRAMUSCULAR

## 2023-08-17 NOTE — Progress Notes (Signed)
Per orders of Shirline Frees, NP, injection of Cyanocobalamin given by Johnella Moloney. Patient tolerated injection well.

## 2023-08-21 ENCOUNTER — Other Ambulatory Visit: Payer: Self-pay | Admitting: Family Medicine

## 2023-08-30 ENCOUNTER — Telehealth: Payer: Self-pay

## 2023-08-30 ENCOUNTER — Encounter: Payer: Self-pay | Admitting: Gastroenterology

## 2023-08-30 ENCOUNTER — Ambulatory Visit: Payer: Medicare Other | Admitting: Gastroenterology

## 2023-08-30 ENCOUNTER — Other Ambulatory Visit (INDEPENDENT_AMBULATORY_CARE_PROVIDER_SITE_OTHER): Payer: Medicare Other

## 2023-08-30 VITALS — BP 124/76 | HR 61 | Ht 69.0 in | Wt 219.4 lb

## 2023-08-30 DIAGNOSIS — E538 Deficiency of other specified B group vitamins: Secondary | ICD-10-CM | POA: Diagnosis not present

## 2023-08-30 DIAGNOSIS — I2510 Atherosclerotic heart disease of native coronary artery without angina pectoris: Secondary | ICD-10-CM | POA: Diagnosis not present

## 2023-08-30 DIAGNOSIS — D509 Iron deficiency anemia, unspecified: Secondary | ICD-10-CM | POA: Diagnosis not present

## 2023-08-30 DIAGNOSIS — I25810 Atherosclerosis of coronary artery bypass graft(s) without angina pectoris: Secondary | ICD-10-CM

## 2023-08-30 DIAGNOSIS — Z7901 Long term (current) use of anticoagulants: Secondary | ICD-10-CM | POA: Diagnosis not present

## 2023-08-30 LAB — COMPREHENSIVE METABOLIC PANEL
ALT: 22 U/L (ref 0–53)
AST: 22 U/L (ref 0–37)
Albumin: 4.5 g/dL (ref 3.5–5.2)
Alkaline Phosphatase: 69 U/L (ref 39–117)
BUN: 18 mg/dL (ref 6–23)
CO2: 27 meq/L (ref 19–32)
Calcium: 9.6 mg/dL (ref 8.4–10.5)
Chloride: 104 meq/L (ref 96–112)
Creatinine, Ser: 1.01 mg/dL (ref 0.40–1.50)
GFR: 73.58 mL/min (ref >60.00)
Glucose, Bld: 142 mg/dL — ABNORMAL HIGH (ref 70–99)
Potassium: 4 meq/L (ref 3.5–5.1)
Sodium: 142 meq/L (ref 135–145)
Total Bilirubin: 0.3 mg/dL (ref 0.2–1.2)
Total Protein: 7.6 g/dL (ref 6.0–8.3)

## 2023-08-30 LAB — CBC WITH DIFFERENTIAL/PLATELET
Basophils Absolute: 0 10*3/uL (ref 0.0–0.1)
Basophils Relative: 0.6 % (ref 0.0–3.0)
Eosinophils Absolute: 0 10*3/uL (ref 0.0–0.7)
Eosinophils Relative: 0.6 % (ref 0.0–5.0)
HCT: 42 % (ref 39.0–52.0)
Hemoglobin: 13.9 g/dL (ref 13.0–17.0)
Lymphocytes Relative: 28.8 % (ref 12.0–46.0)
Lymphs Abs: 1.7 10*3/uL (ref 0.7–4.0)
MCHC: 33.2 g/dL (ref 30.0–36.0)
MCV: 91.1 fL (ref 78.0–100.0)
Monocytes Absolute: 0.6 10*3/uL (ref 0.1–1.0)
Monocytes Relative: 11.2 % (ref 3.0–12.0)
Neutro Abs: 3.4 10*3/uL (ref 1.4–7.7)
Neutrophils Relative %: 58.8 % (ref 43.0–77.0)
Platelets: 190 10*3/uL (ref 150.0–400.0)
RBC: 4.61 Mil/uL (ref 4.22–5.81)
RDW: 17.8 % — ABNORMAL HIGH (ref 11.5–15.5)
WBC: 5.8 10*3/uL (ref 4.0–10.5)

## 2023-08-30 LAB — IBC + FERRITIN
Ferritin: 23.2 ng/mL (ref 22.0–322.0)
Iron: 125 ug/dL (ref 42–165)
Saturation Ratios: 27.4 % (ref 20.0–50.0)
TIBC: 456.4 ug/dL — ABNORMAL HIGH (ref 250.0–450.0)
Transferrin: 326 mg/dL (ref 212.0–360.0)

## 2023-08-30 LAB — FOLATE: Folate: >25.2 ng/mL (ref >5.9)

## 2023-08-30 LAB — VITAMIN B12: Vitamin B-12: 637 pg/mL (ref 211–911)

## 2023-08-30 MED ORDER — SUFLAVE 178.7 G PO SOLR: 1.0000 | Freq: Once | ORAL | 0 refills | Status: AC

## 2023-08-30 MED ORDER — PANTOPRAZOLE SODIUM 40 MG PO TBEC: 40.0000 mg | DELAYED_RELEASE_TABLET | Freq: Two times a day (BID) | ORAL | 3 refills | Status: DC

## 2023-08-30 NOTE — Telephone Encounter (Signed)
   Patient Name: [name]  DOB: Feb 26, 1951 MRN: [id number]  Primary Cardiologist: Nicki Guadalajara, MD  Chart reviewed as part of pre-operative protocol coverage.   Per office protocol, he may hold Plavix for 5 days prior to procedure and should resume as soon as hemodynamically stable postoperatively.    Napoleon Form, Leodis Rains, NP 08/30/2023, 2:59 PM

## 2023-08-30 NOTE — Telephone Encounter (Signed)
 [facility] Pre-operative Risk Assessment     Request for surgical clearance:     Endoscopy Procedure  What type of surgery is being performed?     Colonoscopy/Endoscopy  When is this surgery scheduled?     10/06/23  What type of clearance is required ?   Pharmacy  Are there any medications that need to be held prior to surgery and how long? Plavix 5 days hold   Practice name and name of physician performing surgery?      [facility]  What is your office phone and fax number?      Phone- [phone]  Fax- [phone]  Anesthesia type (None, local, MAC, general) ?       MAC   Please route your response to [facility]

## 2023-08-30 NOTE — Patient Instructions (Addendum)
 Your provider has requested that you go to the basement level for lab work before leaving today. Press "B" on the elevator. The lab is located at the first door on the left as you exit the elevator.  We have sent the following medications to your pharmacy for you to pick up at your convenience:Pantoprazole   You have been scheduled for an endoscopy and colonoscopy. Please follow the written instructions given to you at your visit today.  If you use inhalers (even only as needed), please bring them with you on the day of your procedure.  DO NOT TAKE 7 DAYS PRIOR TO TEST- Trulicity (dulaglutide) Ozempic, Wegovy (semaglutide) Mounjaro (tirzepatide) Bydureon Bcise (exanatide extended release)  DO NOT TAKE 1 DAY PRIOR TO YOUR TEST Rybelsus (semaglutide) Adlyxin (lixisenatide) Victoza (liraglutide) Byetta (exanatide) ___________________________________________________________________________  Bonita Quin will receive your bowel preparation through [facility], which ensures the lowest copay and home delivery, with outreach via text or call from an [phone] number. Please respond promptly to avoid rescheduling of your procedure. If you are interested in alternative options or have any questions regarding your prep, please contact them at [phone] ____________________________________________________________________________  Your Provider Has Sent Your Bowel Prep Regimen To [facility]   [facility] will contact you to verify your information and collect your copay, if applicable. Enjoy the comfort of your home while your prescription is mailed to you, FREE of any shipping charges.   [facility] accepts all major insurance benefits and applies discounts & coupons.  Have additional questions?   Chat: [email or web address] Call: [phone] Email: [email or web address] NCPDP: [id number]  How will [facility] contact you?  With a Welcome phone call,  a Welcome text and a checkout link in text  form.  Texts you receive from [phone] Are NOT Spam.  *To set up delivery, you must complete the checkout process via link or speak to one of the patient care representatives. If [facility] is unable to reach you, your prescription may be delayed.  To avoid long hold times on the phone, you may also utilize the secure chat feature on the [facility] website to request that they call you back for transaction completion or to expedite your concerns.   You will be contacted by our office prior to your procedure for directions on holding your Plavix.  If you do not hear from our office 1 week prior to your scheduled procedure, please call [phone] to discuss.   If your blood pressure at your visit was 140/90 or greater, please contact your primary care physician to follow up on this.  _______________________________________________________  If you are age 12 or older, your body mass index should be between 23-30. Your Body mass index is 32.4 kg/m. If this is out of the aforementioned range listed, please consider follow up with your Primary Care Provider.  If you are age 44 or younger, your body mass index should be between 19-25. Your Body mass index is 32.4 kg/m. If this is out of the aformentioned range listed, please consider follow up with your Primary Care Provider.   ________________________________________________________  The [facility] providers would like to encourage you to use [facility] to communicate with providers for non-urgent requests or questions.  Due to long hold times on the telephone, sending your provider a message by [facility] may be a faster and more efficient way to get a response.  Please allow 48 business hours for a response.  Please remember that this is for non-urgent requests.  _______________________________________________________  Thank you for entrusting me with  your care and choosing [facility].  Bayley Leanna Sato, PA-C

## 2023-08-30 NOTE — Progress Notes (Signed)
 Chief Complaint: Iron deficiency anemia Primary GI MD: Dr. Leone Payor  HPI: 73 year old male history of CAD s/p 2002, repeat cath 01/2022 s/p PCI, on long-term aspirin and Plavix, diabetes, hypertension, presents for evaluation of anemia.  Last echo July 2023 with EF 60 to 65%, mild LVH, grade 1 diastolic dysfunction with aortic sclerosis.  Seen by PCP 05/2023 and generated worsening anemia was discovered positive Hemoccult.  Appears patient has had mild anemia with baseline around until July 2024.  Hemoglobin was found to be 10.8  01/06/2023: Hgb 9.8, MCV 76.2, RDW 17.8, platelets 155 05/21/2023: Hgb 8.5 MCV 72, RDW 15.1, platelets 174.  Iron 11, saturation 2%, ferritin 4.6, TIBC 551.6, vitamin B12 92, normal folate  He has a history of anemia, first noted in 2022, with hemoglobin levels gradually decreasing to 8.5 in November. The anemia is multifactorial, attributed to both iron and B12 deficiencies. He has been taking B12, initially as injections and now as over-the-counter tablets, which have improved his symptoms. He notes significant improvement in muscle and chest pain with B12 shots, stating 'every time I took a shot, every time I felt better.' He is also on iron supplementation and has had black stools since. No previous bleeding.  He is on blood thinners, including Plavix and aspirin, due to a cardiac stent placement, which complicates his anemia management.   He underwent a colonoscopy in 2007, which was normal. He is scheduled for another colonoscopy and endoscopy to investigate potential sources of gastrointestinal bleeding.  He takes B12 tablets twice daily and a multivitamin containing B12 and iron. He reports feeling generally well, except for a recent hip injury that has caused pain. No changes in bowel habits, nausea, vomiting, or heartburn.      PREVIOUS GI WORKUP   Colonoscopy in 2007 Dr. Leone Payor - No polyps - Mild diverticulosis  Past Medical History:  Diagnosis Date    CAD (coronary artery disease)    nuclear stress test-09/09/11 low risk scan EF61%, sees Dr. Tresa Endo    Chronic headache    Chronic neck pain    Diabetes mellitus    sees Dr. Lonzo Cloud   Hyperlipidemia    Hypertension 12/26/2008   ECHO- EF>55%   Insomnia     Past Surgical History:  Procedure Laterality Date   APPENDECTOMY     cervical spine injection     COLONOSCOPY  04-02-06    per [facility], clear, repeat in 10 yrs   CORONARY ARTERY BYPASS GRAFT  2002   4 vessels   CORONARY PRESSURE/FFR STUDY N/A 01/12/2022   Procedure: INTRAVASCULAR PRESSURE WIRE/FFR STUDY;  Surgeon: Runell Gess, MD;  Location: [facility] INVASIVE CV LAB;  Service: Cardiovascular;  Laterality: N/A;   CORONARY STENT INTERVENTION N/A 01/12/2022   Procedure: CORONARY STENT INTERVENTION;  Surgeon: Runell Gess, MD;  Location: [facility] INVASIVE CV LAB;  Service: Cardiovascular;  Laterality: N/A;   LEFT HEART CATH AND CORS/GRAFTS ANGIOGRAPHY N/A 01/12/2022   Procedure: LEFT HEART CATH AND CORS/GRAFTS ANGIOGRAPHY;  Surgeon: Runell Gess, MD;  Location: [facility] INVASIVE CV LAB;  Service: Cardiovascular;  Laterality: N/A;    Current Outpatient Medications  Medication Sig Dispense Refill   ACCU-CHEK GUIDE TEST test strip USE TO TEST BLOOD SUGAR EVERY DAY 100 strip 3   amLODipine (NORVASC) 5 MG tablet TAKE 1 AND 1/2 TABLETS DAILY BY MOUTH 135 tablet 3   aspirin 81 MG tablet Take 81 mg by mouth daily.     clonazePAM (KLONOPIN) 0.5 MG tablet TAKE 1  TABLET BY MOUTH TWICE A DAY AS NEEDED FOR ANXIETY 60 tablet 5   clopidogrel (PLAVIX) 75 MG tablet TAKE 1 TABLET BY MOUTH EVERY DAY WITH BREAKFAST 90 tablet 3   Coenzyme Q10 (COQ10) 100 MG CAPS Take 100 mg by mouth daily.      Continuous Glucose Sensor (FREESTYLE LIBRE 3 SENSOR) MISC 1 Device by Does not apply route every 14 (fourteen) days. Place 1 sensor on the skin every 14 days. Use to check glucose continuously 6 each 3   dapagliflozin propanediol (FARXIGA) 10 MG TABS tablet Take  1 tablet (10 mg total) by mouth daily. 90 tablet 3   Evolocumab (REPATHA SURECLICK) 140 MG/ML SOAJ Inject 140 mg into the skin every 14 (fourteen) days. 6 mL 3   gabapentin (NEURONTIN) 300 MG capsule Take 2 (600 mg) capsules by mouth twice daily 120 capsule 5   glipiZIDE (GLUCOTROL) 5 MG tablet Take 1 tablet (5 mg total) by mouth daily. 90 tablet 3   Iron, Ferrous Sulfate, 325 (65 Fe) MG TABS Take 325 mg by mouth 2 (two) times daily. 60 tablet 5   lisinopril (ZESTRIL) 40 MG tablet TAKE 1 TABLET BY MOUTH EVERY DAY 90 tablet 2   MELATONIN GUMMIES PO Take 2 each by mouth at bedtime.     nebivolol (BYSTOLIC) 2.5 MG tablet TAKE 1 TABLET BY MOUTH EVERY DAY 90 tablet 3   Omega-3 350 MG CPDR Take 350 mg by mouth daily.     pantoprazole (PROTONIX) 40 MG tablet Take 1 tablet (40 mg total) by mouth 2 (two) times daily. 60 tablet 3   pioglitazone (ACTOS) 45 MG tablet Take 1 tablet (45 mg total) by mouth daily. 90 tablet 3   sildenafil (VIAGRA) 100 MG tablet Take 1 tablet (100 mg total) by mouth daily as needed for erectile dysfunction. 10 tablet 1   SUFLAVE 178.7 g SOLR Take 1 kit by mouth once for 1 dose. 1 each 0   sulfamethoxazole-trimethoprim (BACTRIM DS) 800-160 MG tablet TAKE 1 TABLET BY MOUTH 2 (TWO) TIMES DAILY AS NEEDED (URINE SYMPTOMS). 60 tablet 2   tamsulosin (FLOMAX) 0.4 MG CAPS capsule TAKE 1 CAPSULE BY MOUTH EVERY DAY 90 capsule 3   traZODone (DESYREL) 100 MG tablet Take 1 tablet (100 mg total) by mouth at bedtime. 90 tablet 3   nitroGLYCERIN (NITROSTAT) 0.4 MG SL tablet Place 1 tablet (0.4 mg total) under the tongue every 5 (five) minutes as needed for chest pain. 25 tablet 6   No current facility-administered medications for this visit.    Allergies as of 08/30/2023 - Review Complete 08/30/2023  Allergen Reaction Noted   Codeine Anaphylaxis 04/04/2007   Ambien [zolpidem tartrate] Other (See Comments) 01/12/2022   Crestor [rosuvastatin]  12/01/2012   Isordil [isosorbide nitrate] Other  (See Comments) 07/05/2014   Lipitor [atorvastatin] Other (See Comments) 12/01/2012   Zetia [ezetimibe] Other (See Comments) 11/26/2021   Cipro [ciprofloxacin hcl] Hives and Rash    Victoza [liraglutide] Rash 08/22/2018    Family History  Problem Relation Age of Onset   Liver cancer Father    Cancer Brother    Cancer Brother    Cancer Brother    Coronary artery disease Other    Hypertension Other    Heart disease Mother    Colon cancer Neg Hx    Stomach cancer Neg Hx    Esophageal cancer Neg Hx     Social History   Socioeconomic History   Marital status: Married    Spouse  name: Not on file   Number of children: Not on file   Years of education: Not on file   Highest education level: Not on file  Occupational History   Not on file  Tobacco Use   Smoking status: Former    Current packs/day: 0.00    Average packs/day: 0.3 packs/day for 10.0 years (2.5 ttl pk-yrs)    Types: Cigarettes    Start date: 11/25/1960    Quit date: 11/26/1970    Years since quitting: 52.7   Smokeless tobacco: Never  Vaping Use   Vaping status: Never Used  Substance and Sexual Activity   Alcohol use: No    Alcohol/week: 0.0 standard drinks of alcohol   Drug use: No   Sexual activity: Yes  Other Topics Concern   Not on file  Social History Narrative   Not on file   Social Drivers of Health   Financial Resource Strain: Low Risk  (10/13/2021)   Overall Financial Resource Strain (CARDIA)    Difficulty of Paying Living Expenses: Not hard at all  Food Insecurity: No Food Insecurity (10/13/2021)   Hunger Vital Sign    Worried About Running Out of Food in the Last Year: Never true    Ran Out of Food in the Last Year: Never true  Transportation Needs: No Transportation Needs (10/13/2021)   PRAPARE - Administrator, Civil Service (Medical): No    Lack of Transportation (Non-Medical): No  Physical Activity: Sufficiently Active (10/13/2021)   Exercise Vital Sign    Days of Exercise per  Week: 5 days    Minutes of Exercise per Session: 30 min  Stress: No Stress Concern Present (10/13/2021)   [employer] of Occupational Health - Occupational Stress Questionnaire    Feeling of Stress : Not at all  Social Connections: Socially Integrated (10/13/2021)   Social Connection and Isolation Panel [NHANES]    Frequency of Communication with Friends and Family: More than three times a week    Frequency of Social Gatherings with Friends and Family: More than three times a week    Attends Religious Services: More than 4 times per year    Active Member of [employer] or Organizations: Yes    Attends Engineer, structural: More than 4 times per year    Marital Status: Married  Catering manager Violence: Not At Risk (10/13/2021)   Humiliation, Afraid, Rape, and Kick questionnaire    Fear of Current or Ex-Partner: No    Emotionally Abused: No    Physically Abused: No    Sexually Abused: No    Review of Systems:    Constitutional: No weight loss, fever, chills, weakness or fatigue HEENT: Eyes: No change in vision               Ears, Nose, Throat:  No change in hearing or congestion Skin: No rash or itching Cardiovascular: No chest pain, chest pressure or palpitations   Respiratory: No SOB or cough Gastrointestinal: See HPI and otherwise negative Genitourinary: No dysuria or change in urinary frequency Neurological: No headache, dizziness or syncope Musculoskeletal: No new muscle or joint pain Hematologic: No bleeding or bruising Psychiatric: No history of depression or anxiety    Physical Exam:  Vital signs: BP 124/76   Pulse 61   Ht 5\' 9"  (1.753 m)   Wt 219 lb 6 oz (99.5 kg)   BMI 32.40 kg/m   Constitutional: NAD, Well developed, Well nourished, alert and cooperative Head:  Normocephalic and atraumatic.  Eyes:   PEERL, EOMI. No icterus. Conjunctiva pink. Respiratory: Respirations even and unlabored. Lungs clear to auscultation bilaterally.   No wheezes, crackles, or  rhonchi.  Cardiovascular:  Regular rate and rhythm. No peripheral edema, cyanosis or pallor.  Gastrointestinal:  Soft, nondistended, nontender. No rebound or guarding. Normal bowel sounds. No appreciable masses or hepatomegaly. Rectal:  Not performed.  Msk:  Symmetrical without gross deformities. Without edema, no deformity or joint abnormality.  Neurologic:  Alert and  oriented x4;  grossly normal neurologically.  Skin:   Dry and intact without significant lesions or rashes. Psychiatric: Oriented to person, place and time. Demonstrates good judgement and reason without abnormal affect or behaviors.   RELEVANT LABS AND IMAGING: CBC    Component Value Date/Time   WBC 4.8 05/21/2023 1141   RBC 3.98 (L) 05/21/2023 1141   HGB 8.5 Repeated and verified X2. (L) 05/21/2023 1141   HGB 11.1 (L) 08/10/2022 0846   HCT 28.6 (L) 05/21/2023 1141   HCT 36.8 (L) 08/10/2022 0846   PLT 174.0 05/21/2023 1141   PLT 282 08/10/2022 0846   MCV 72.0 (L) 05/21/2023 1141   MCV 81 08/10/2022 0846   MCH 24.4 (L) 08/10/2022 0846   MCH 26.5 01/13/2022 0431   MCHC 29.7 (L) 05/21/2023 1141   RDW 18.1 (H) 05/21/2023 1141   RDW 14.5 08/10/2022 0846   LYMPHSABS 1.2 05/21/2023 1141   MONOABS 0.5 05/21/2023 1141   EOSABS 0.0 05/21/2023 1141   BASOSABS 0.0 05/21/2023 1141    CMP     Component Value Date/Time   NA 140 01/06/2023 1033   NA 143 11/09/2022 0834   K 4.2 01/06/2023 1033   CL 104 01/06/2023 1033   CO2 29 01/06/2023 1033   GLUCOSE 89 01/06/2023 1033   BUN 11 01/06/2023 1033   BUN 16 11/09/2022 0834   CREATININE 0.86 01/06/2023 1033   CREATININE 0.84 11/22/2017 0846   CALCIUM 9.7 01/06/2023 1033   PROT 6.5 11/09/2022 0834   ALBUMIN 4.2 11/09/2022 0834   AST 23 11/09/2022 0834   ALT 20 11/09/2022 0834   ALKPHOS 83 11/09/2022 0834   BILITOT 0.2 11/09/2022 0834   GFRNONAA >60 01/13/2022 0431   GFRNONAA 90 11/22/2017 0846   GFRAA 100 08/15/2020 0844   GFRAA 104 11/22/2017 0846      Assessment/Plan:      Anemia (Iron and B12 deficient) Chronic anemia since 2022, worsened to hemoglobin of 8.5 in November. Positive stool study for blood. Patient on Plavix and aspirin for cardiac stent, which may contribute to microscopic GI blood loss. No overt GI symptoms. Last colonoscopy in 2007.  Anemia likely multifactorial with low B12 and low iron.  He has been on iron and B12 since November and feeling better - EGD/colonoscopy for further evaluation - I thoroughly discussed the procedure with the patient (at bedside) to include nature of the procedure, alternatives, benefits, and risks (including but not limited to bleeding, infection, perforation, anesthesia/cardiac pulmonary complications).  Patient verbalized understanding and gave verbal consent to proceed with procedure. - Pantoprazole 40 Mg twice daily - CBC, CMP, IBC plus ferritin, B12/folate - soonest available double if April, if labs show worsening anemia may need to put patient with another provider to get him in sooner.  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 -Continue current cardiac medications as directed by cardiologist. -Communicate with cardiologist regarding holding Plavix for 5 days prior to endoscopy.  --  Will hold Plavix 5 days prior to endoscopic procedures - will instruct when and how to resume after procedure. Benefits and risks of procedure explained including risks of bleeding, perforation, infection, missed lesions, reactions to medications and possible need for hospitalization and surgery for complications. Additional rare but real risk of stroke or other vascular clotting events off Plavix also explained and need to seek urgent help if any signs of these problems occur. Will communicate by phone or EMR with patient's  prescribing provider to confirm that holding Plavix is reasonable in this case.            Lara Mulch [place]  [facility] 08/30/2023, 2:36 PM  Cc: Nelwyn Salisbury, MD

## 2023-08-31 ENCOUNTER — Telehealth: Payer: Self-pay

## 2023-08-31 NOTE — Telephone Encounter (Signed)
 Called patient to discuss cardiac clearance no answer left message on voice mail to call office back.

## 2023-09-02 ENCOUNTER — Other Ambulatory Visit: Payer: Self-pay | Admitting: Family Medicine

## 2023-09-02 DIAGNOSIS — M5432 Sciatica, left side: Secondary | ICD-10-CM

## 2023-09-02 NOTE — Telephone Encounter (Signed)
 Copied from CRM [id number]. Topic: Clinical - Medication Refill >> Sep 02, 2023 11:20 AM Myrtice Lauth wrote: Most Recent Primary Care Visit:  Provider: Johnella Moloney  Department: [facility]  Visit Type: NURSE VISIT  Date: 08/17/2023  Medication: gabapentin (NEURONTIN) 300 MG capsule   Has the patient contacted their pharmacy? Yes (Agent: If no, request that the patient contact the pharmacy for the refill. If patient does not wish to contact the pharmacy document the reason why and proceed with request.) (Agent: If yes, when and what did the pharmacy advise?)  Is this the correct pharmacy for this prescription? Yes If no, delete pharmacy and type the correct one.  This is the patient's preferred pharmacy:  [id number] [place], [place] - [address] [place] [address] Phone: [phone] Fax: [phone]    Has the prescription been filled recently? Yes  Is the patient out of the medication? Yes  Has the patient been seen for an appointment in the last year OR does the patient have an upcoming appointment? Yes  Can we respond through MyChart? Yes  Agent: Please be advised that Rx refills may take up to 3 business days. We ask that you follow-up with your pharmacy.

## 2023-09-06 MED ORDER — GABAPENTIN 300 MG PO CAPS: ORAL_CAPSULE | ORAL | 1 refills | Status: DC

## 2023-09-06 NOTE — Telephone Encounter (Signed)
 Second attempt called patient to discuss clearance received fax from cardiology okay to hold Plavix 5 days prior to procedure left message on voicemail to call office back.

## 2023-09-08 ENCOUNTER — Telehealth: Payer: Self-pay

## 2023-09-08 NOTE — Telephone Encounter (Signed)
 No additional notes needed

## 2023-09-08 NOTE — Telephone Encounter (Signed)
 Notified patient via Mychart advise okay to Plavix 5 days before procedure. Patient response was "okay".

## 2023-09-09 ENCOUNTER — Other Ambulatory Visit: Payer: Self-pay | Admitting: Family Medicine

## 2023-09-21 ENCOUNTER — Ambulatory Visit (HOSPITAL_COMMUNITY): Payer: Medicare Other | Attending: Cardiovascular Disease

## 2023-09-21 DIAGNOSIS — R011 Cardiac murmur, unspecified: Secondary | ICD-10-CM | POA: Insufficient documentation

## 2023-09-21 LAB — ECHOCARDIOGRAM COMPLETE
Area-P 1/2: 3.05 cm2
S' Lateral: 3.7 cm

## 2023-09-27 ENCOUNTER — Ambulatory Visit: Payer: Self-pay | Admitting: *Deleted

## 2023-09-27 ENCOUNTER — Ambulatory Visit: Admitting: Family Medicine

## 2023-09-27 ENCOUNTER — Other Ambulatory Visit: Payer: Self-pay | Admitting: Family Medicine

## 2023-09-27 NOTE — Telephone Encounter (Signed)
  Chief Complaint: leg / hip pain bilaterally difficulty walking per wife  Symptoms: see above pain dull like "toothache" Frequency: chronic but worsening x 1 week Pertinent Negatives: Patient denies inability to walk Disposition: [] ED /[] Urgent Care (no appt availability in office) / [x] Appointment(In office/virtual)/ []  [facility]/ [] Home Care/ [] Refused Recommended Disposition /[] [facility]/ []  Follow-up with PCP Additional Notes:   No available appt with PCP . Scheduled appt with other provider in practice. Please advise. Recommended if patient unable to walk go to ED.        Copied from CRM [id number]. Topic: Clinical - Red Word Triage >> Sep 27, 2023  8:54 AM Deaijah H wrote: Red Word that prompted transfer to Nurse Triage: Terrible leg and hip pain (both sides). Hard to walk. Reason for Disposition  [1] SEVERE pain (e.g., excruciating, unable to do any normal activities) AND [2] not improved after 2 hours of pain medicine  Answer Assessment - Initial Assessment Questions 1. LOCATION and RADIATION: "Where is the pain located?"      Bilateral hip pain  2. QUALITY: "What does the pain feel like?"  (e.g., sharp, dull, aching, burning)     Dull  3. SEVERITY: "How bad is the pain?" "What does it keep you from doing?"   (Scale 1-10; or mild, moderate, severe)   -  MILD (1-3): doesn't interfere with normal activities    -  MODERATE (4-7): interferes with normal activities (e.g., work or school) or awakens from sleep, limping    -  SEVERE (8-10): excruciating pain, unable to do any normal activities, unable to walk     Moderate not sleeping  4. ONSET: "When did the pain start?" "Does it come and go, or is it there all the time?"     1 week  5. WORK OR EXERCISE: "Has there been any recent work or exercise that involved this part of the body?"      na 6. CAUSE: "What do you think is causing the hip pain?"      Chronic issue 7. AGGRAVATING FACTORS: "What makes the  hip pain worse?" (e.g., walking, climbing stairs, running)     Walking  8. OTHER SYMPTOMS: "Do you have any other symptoms?" (e.g., back pain, pain shooting down leg,  fever, rash)     Sciatic pain  Protocols used: Hip Pain-A-AH

## 2023-09-27 NOTE — Telephone Encounter (Signed)
 Copied from CRM [id number]. Topic: Clinical - Medication Refill >> Sep 27, 2023  1:18 PM Florestine Avers wrote: Most Recent Primary Care Visit:  Provider: Johnella Moloney  Department: [facility]  Visit Type: NURSE VISIT  Date: 08/17/2023  Medication: Meloxicam (unsure of the dosage, patients wife states Dr. Clent Ridges porescribed this medication in the past but I do not see it on his meds list).  Has the patient contacted their pharmacy? Yes (Agent: If no, request that the patient contact the pharmacy for the refill. If patient does not wish to contact the pharmacy document the reason why and proceed with request.) (Agent: If yes, when and what did the pharmacy advise?)  Is this the correct pharmacy for this prescription? Yes If no, delete pharmacy and type the correct one.  This is the patient's preferred pharmacy:  [id number] [place], [place] - [address] [place] [address] Phone: [phone] Fax: [phone]  [id number] - [address] ([place]), [place] - [address] [place] [address] Phone: [phone] Fax: [phone]  [facility] - [place], [place] - [address] [place] [address] Phone: [phone] Fax: [phone]   Has the prescription been filled recently? No  Is the patient out of the medication? Yes  Has the patient been seen for an appointment in the last year OR does the patient have an upcoming appointment? No  Can we respond through MyChart? Yes  Agent: Please be advised that Rx refills may take up to 3 business days. We ask that you follow-up with your pharmacy.

## 2023-09-28 DIAGNOSIS — H2513 Age-related nuclear cataract, bilateral: Secondary | ICD-10-CM | POA: Diagnosis not present

## 2023-09-28 DIAGNOSIS — H524 Presbyopia: Secondary | ICD-10-CM | POA: Diagnosis not present

## 2023-09-28 DIAGNOSIS — E119 Type 2 diabetes mellitus without complications: Secondary | ICD-10-CM | POA: Diagnosis not present

## 2023-09-28 LAB — HM DIABETES EYE EXAM

## 2023-09-28 NOTE — Telephone Encounter (Signed)
 Spoke with pt spouse offered pt to see another provider in the office for the issue since PCP is out of the office, pt declined stated that he has ap Chiropractor appointment on 09/01/23

## 2023-09-30 ENCOUNTER — Ambulatory Visit: Payer: Medicare Other | Attending: Cardiovascular Disease | Admitting: Cardiovascular Disease

## 2023-09-30 ENCOUNTER — Encounter: Payer: Self-pay | Admitting: Cardiovascular Disease

## 2023-09-30 VITALS — BP 130/70 | HR 55 | Ht 69.0 in | Wt 218.0 lb

## 2023-09-30 DIAGNOSIS — E7841 Elevated Lipoprotein(a): Secondary | ICD-10-CM | POA: Diagnosis not present

## 2023-09-30 DIAGNOSIS — Z951 Presence of aortocoronary bypass graft: Secondary | ICD-10-CM | POA: Diagnosis not present

## 2023-09-30 DIAGNOSIS — E785 Hyperlipidemia, unspecified: Secondary | ICD-10-CM

## 2023-09-30 DIAGNOSIS — I10 Essential (primary) hypertension: Secondary | ICD-10-CM

## 2023-09-30 DIAGNOSIS — I25709 Atherosclerosis of coronary artery bypass graft(s), unspecified, with unspecified angina pectoris: Secondary | ICD-10-CM

## 2023-09-30 DIAGNOSIS — E611 Iron deficiency: Secondary | ICD-10-CM

## 2023-09-30 DIAGNOSIS — E119 Type 2 diabetes mellitus without complications: Secondary | ICD-10-CM

## 2023-09-30 DIAGNOSIS — Q2112 Patent foramen ovale: Secondary | ICD-10-CM

## 2023-09-30 DIAGNOSIS — Z789 Other specified health status: Secondary | ICD-10-CM

## 2023-09-30 NOTE — Progress Notes (Signed)
 Patient ID: [name], male   DOB: Nov 30, 1950, 73 y.o.   MRN: [id number]       Primary M.D.: Dr. Gershon Crane  HPI: [name] is a 73 y.o. male presents to the office today for a 6 month follow up cardiology evaluation.   [name] has CAD and in June 2002 underwent CABG surgery by Dr. [name] with a sequential LIMA graft to the second diagonal and LAD, and sequential radial graft to the first and third diagonal branches. An echo Doppler study in 2010 showed normal systolic function with mild MR and trace TR.  A  nuclear perfusion study in March 2013 showed normal perfusion and function without scar or ischemia.  Additional problems include hypertension, type 2 diabetes mellitus, and hyperlipidemia. Remotely, he had worked as a Haematologist minor and in the past was some question of possible black lung. 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  In November 2015 he complained of a slight change in symptomatology with exertional shortness of breath and some arm weakness.  Prior to bypass surgery, he had never experienced chest pain but demonstrated exertional shortness of breath and arm discomfort.  He admitted to fatigue.  He denied any major episodes of chest pressure.  A 2-D echo Doppler study on 06/14/2014 showed an ejection fraction of 55-60% with mild focal basal hypertrophy of the septum; grade 1 diastolic dysfunction.  There was evidence for mild mitral regurgitation and moderate left atrial dilatation.  A nuclear perfusion study on December 11,2015 was interpreted as low risk.  He developed 1-2 mm of horizontal ST segment depression and had normal scintigraphic myocardial perfusion images.  Ejection fraction was 57%.  There was normal LV function without wall motion abnormality.  He has a history of hyperlipidemia and remotely self increased his simvastatin from 40 mg to 80 mg and developed occasional myalgias.   He has been on Bystolic 10 mg and lisinopril 30 mg for his hypertension.  He is on metformin 1000 mg twice a day as well as DiaBeta 5 mg for his type 2 diabetes mellitus.  When I saw him in October 2018, he had stopped taking Livalo for approximately one month.  He had been busy doing home projects and denied any recurrent chest pain.  During his last evaluation.  I had a lengthy discussion with him and recommended another attempt at a trial of Livalo.  I also added supplemental coenzyme Q 10.  Over this time period, he was able to reinitiate 2 mg daily and has been able to tolerate this without myalgias.  Subsequent blood work did show improvement in his lipid status on 08/04/2017 with a total cholesterol 149, LDL 82.  Triglycerides 148, and HDL 37.  He has continued to be on Januvia and metformin for his diabetes mellitus.  Hypertension.  He has remained on amlodipine 5 mg, lisinopril 30 mg, and Bystolic 10 mg.   When evaluated in March 2019 he was tolerating Livalo and I recommended further titration to 4 mg daily and attempt to obtain an LDL less than 70.  Since I saw him, he has had issues with poorly controlled diabetes mellitus.  Apparently he was started on farziga but developed some dizziness secondary to this.  He is now on metformin, Januvia, and was started on Actos.  Earlier this year he had a fall off a ladder.   I saw him in October 2019.  He required  rotator cuff surgery on his right shoulder.  His surgery was delayed due to initial glucose issues.  Since it is been over 17 years since his CABG revascularization, I recommended that he undergo a preoperative exercise Myoview study for preoperative assessment and clearance.  He underwent a nuclear perfusion study on May 05, 2018.  This was interpreted as low risk with normal perfusion and mildly reduced left ventricular global systolic function with an EF of 48%.  He developed horizontal ST segment depression of 1.5 mm during stress which  returned to baseline at 5 to 9 minutes into the recovery period.  When compared to his prior nuclear perfusion study of 2015 this was not significantly changed and on that study he also had normal perfusion with positive ECG changes.  A subsequent echo Doppler study on May 30, 2018 showed an EF of 55 to 60%.  There was severe LVH.  There was grade 1 diastolic dysfunction with mild MR and mild LA dilation.  He denies any chest pain.  He has had periods of increased cough following prolonged coughs he may feel somewhat atypical chest pain discomfort.  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 Kendrick Fries.  He had his pulmonary evaluation with Dr. 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  I saw him in March 2021.  Over the prior year he had remained stable from a cardiovascular standpoint.  He was not having anginal symptomatology 18-1/2 years following his CABG revascularization.  He developed Covid infection and was very weak and fatigued for 5 days.  In addition his son died in 05/17/20after he developed a seizure while driving a motorcycle and was around 74 years old.  In addition, his sister died at age 67 secondary to cancer.  He continues to be on amlodipine 5 mg, isosorbide 30 mg, lisinopril 40 mg and Bystolic 10 mg for hypertension.  He is on Zetia and rosuvastatin 10 mg for hyperlipidemia in addition to omega-3 fatty acids.  He is diabetic on Metformin, Farxiga, and Actos.    He was  evaluated by me in August 2021.  [name] has done  fairly well.  However, he admits to experiencing some episodes of chest pressure over the past 6 to 8 weeks.  Mostly this occurs without exertion and oftentimes occurs when he goes more in a reclining position when on his recliner chair.  However, he has noticed some vague symptomatology with walking.  However he walks his dogs regularly and often runs with them and denies any chest pressure when he is running with his dogs.  He does note some mild lightheadedness when he bends over and its up abruptly.  He is unaware of palpitations.  He underwent an echo Doppler study in February 05, 2020 which showed an EF of 55 to 60% and grade 2 diastolic dysfunction.  There was mild mitral gravitation, and mild aortic valve sclerosis.  The ascending aorta measured 38 mm.  Laboratory in April 2021 showed an LDL of 71 on Zetia and rosuvastatin.  He continues to  be on Metformin, Farxiga, and Actos for his diabetes mellitus.    He underwent a nuclear perfusion study for evaluation of his chest pain which was done on April 05, 2020.  This was low risk and showed an EF at 55%.  It was very mild ST depression at peak infusion without any definitive perfusion defect on nuclear imaging.  I saw him on August 15, 2020 at which time he felt well and denied any recurrent chest pain.  He continued  to be on amlodipine 5 mg, lisinopril 40 mg, isosorbide 30 mg, and Bystolic 5 mg for his hypertension and CAD.  He is diabetic on Farxiga, and glipizide in addition to Actos and recently he stopped taking Metformin.  He has felt improved off Metformin treatment.  He was on Zetia for hyperlipidemia.  He has not had recent laboratory.  During that evaluation, his blood pressure was elevated and I recommended slight titration of amlodipine from 5 mg up to 7.5 mg and recommended he continue his current regimen of isosorbide, lisinopril and nebivolol.  He was no longer on metformin but was on Farxiga in addition to glipizide and Actos for his  diabetes mellitus.  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  Recently, he has had recurrent problems with prostate infections and has been on antibiotics.  He recently has noticed some chest discomfort and shortness of breath.  He is unaware of any palpitations.  He apparently is no longer on isosorbide due to dizziness and is no longer on Zetia.  He had undergone laboratory on August 05, 2021 which showed total cholesterol 180, triglycerides 102, HDL 42 and LDL 117.  During that evaluation, I recommended titration of amlodipine to 10 mg and scheduled him for [facility] study for reassessment of his chest pain and potential ischemia.  I renewed his prescription for sublingual nitroglycerin and recommended reinstitution of Zetia.  I recommended follow-up laboratory.  He underwent a Lexiscan Myoview study on October 28, 2021 which showed EF at 44% with a small fixed defect in the basal inferior wall consistent with possible small prior infarction without associated ischemia.  I saw him on Nov 21, 2021 at which time he continued to feel well and denied chest pain or shortness of breath.    His back is better and he is now walking his dog 2-3 times per day.  He cannot tolerate statin therapy.  Recent lipid studies from Nov 10, 2021 showed total cholesterol 152 triglycerides 73 HDL 43 and LDL 95, improved from 125 in February 2022.  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  During that evaluation, with his statin intolerance and elevated LP(a) I referred him to our pharmacist for initiation of Repatha which should result in a potential 26 to 30% reduction.  Since I last saw him, he developed chest pain leading to an emergency room evaluation and admission on January 10, 2022.  He was evaluated by Dr. Flora Lipps.  He underwent  cardiac catheterization 03/15/2022 by Dr. Allyson Sabal.  And had patent grafts with a sequential LIMA to the diagonal and LAD and left  radial to diagonal branch.  The PDA and PLA were occluded distally but these were small vessels received collaterals from the distal circumflex.  Felt to have 75% mid AV groove circumflex stenosis and underwent successful DFR guided mediated.  Intervention with stenting with a 2.5 x 12 mm Synergy DES stent.  Subsequently, Mr.  Keirsey has felt well.  He denies any recurrent chest pain.  He is walking 2-3 times a day taking his dog for walks.  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  Remotely he had worked in [employer].  He denies any significant shortness of breath.  He is now on DAPT with aspirin/Plavix, for hypertension and CAD on amlodipine 5 mg, lisinopril 40 mg and nebivolol 2.5 mg daily.  He is diabetic on Farxiga, glipizide, and Actos.  He has restless legs on gabapentin.  During that evaluation, I recommended institution of PCSK9 inhibition particularly since in the past he was remotely found to have an elevated LP(a).  I saw him on August 12, 2022.  Since his prior evaluation he was started on Praluent 75 mg by pharmacy.  He also was no longer taking Zetia and statins due to leg discomfort.  His wife also had been started on treatment as well.  At the end of the year, she had developed some issues and as result her PCSK9 in addition was discontinued.  Apparently for some reason [name] was discontinued as well.  Presently, he denies any chest pain or shortness of breath.  He continues to see Dr. Jeannett Senior 5 for primary care.  He continues to be on amlodipine 5 mg, lisinopril 40 mg, and nebivolol 2.5 mg daily for hypertension.  He is on Farxiga, glipizide and Actos for his diabetes mellitus.  He continues to be on DAPT with aspirin/Plavix.  That evaluation, I was able to work with pharmacy and reinstitute PCSK9 inhibition.  I last saw him on April 05, 2023 at which time he felt well.  He has issues with left hip discomfort and has some sciatica discomfort.  He  denies any chest pain.  He continues to be on amlodipine 5 mg, lisinopril 40 mg, and Bystolic 2.5 mg daily for hypertension.  He is now on Repatha every 2-week injection and takes over-the-counter omega-3 fatty acid.  He is diabetic on glipizide in addition to pioglitazone.  He has been on long-term DAPT with aspirin/Plavix and denies any recurrent anginal symptomatology.  Repeat lipid studies on Nov 09, 2022 were excellent with total cholesterol 102, HDL 46, LDL 39, triglycerides 89.  Hemoglobin A1c in April 2024 was increased at 7.8.  Creatinine 0.86.    Since I last saw him, he underwent an echo Doppler study on September 21, 2023 which showed EF 55 to 60%.  There was mild concentric LVH and grade 2 diastolic dysfunction.  There was hypokinesis of the basal inferior and basal inferoseptal segment.  The left atrium was severely dilated, the right atrium was mildly dilated.  There is mild to moderate MR.  There was mild aortic sclerosis.  There is a suggestion of a very small patent foramen ovale with left-to-right shunting across the atrial septum.  He denies any chest pain or shortness of breath.  He is scheduled to undergo endoscopy and colonoscopy by Dr. Leone Payor in early April.  He presents for cardiology evaluation.  Past Medical History:  Diagnosis Date   CAD (coronary artery disease)    nuclear stress test-09/09/11 low risk scan EF61%, sees Dr. Tresa Endo    Chronic headache    Chronic neck pain    Diabetes mellitus    sees Dr. Lonzo Cloud   Hyperlipidemia    Hypertension 12/26/2008   ECHO- EF>55%   Insomnia     Past Surgical History:  Procedure Laterality Date   APPENDECTOMY  cervical spine injection     COLONOSCOPY  04-02-06    per [facility], clear, repeat in 10 yrs   CORONARY ARTERY BYPASS GRAFT  2002   4 vessels   CORONARY PRESSURE/FFR STUDY N/A 01/12/2022   Procedure: INTRAVASCULAR PRESSURE WIRE/FFR STUDY;  Surgeon: Runell Gess, MD;  Location: [facility];  Service:  Cardiovascular;  Laterality: N/A;   CORONARY STENT INTERVENTION N/A 01/12/2022   Procedure: CORONARY STENT INTERVENTION;  Surgeon: Runell Gess, MD;  Location: [facility];  Service: Cardiovascular;  Laterality: N/A;   LEFT HEART CATH AND CORS/GRAFTS ANGIOGRAPHY N/A 01/12/2022   Procedure: LEFT HEART CATH AND CORS/GRAFTS ANGIOGRAPHY;  Surgeon: Runell Gess, MD;  Location: [facility];  Service: Cardiovascular;  Laterality: N/A;    Allergies  Allergen Reactions   Codeine Anaphylaxis    Pt. verbalized that he has taken and can take Hydrocodone, Oxycodone w/o issue - codeine allergy only   Ambien [Zolpidem Tartrate] Other (See Comments)    Headache    Crestor [Rosuvastatin]     Myalgia    Isordil [Isosorbide Nitrate] Other (See Comments)    Headaches   Lipitor [Atorvastatin] Other (See Comments)    Myalgia    Zetia [Ezetimibe] Other (See Comments)    Myalgia    Cipro [Ciprofloxacin Hcl] Hives and Rash   Victoza [Liraglutide] Rash    Current Outpatient Medications  Medication Sig Dispense Refill   ACCU-CHEK GUIDE TEST test strip USE TO TEST BLOOD SUGAR EVERY DAY 100 strip 3   amLODipine (NORVASC) 5 MG tablet TAKE 1 AND 1/2 TABLETS DAILY BY MOUTH 135 tablet 3   aspirin 81 MG tablet Take 81 mg by mouth daily.     clonazePAM (KLONOPIN) 0.5 MG tablet TAKE 1 TABLET BY MOUTH TWICE A DAY AS NEEDED FOR ANXIETY 60 tablet 5   clopidogrel (PLAVIX) 75 MG tablet TAKE 1 TABLET BY MOUTH EVERY DAY WITH BREAKFAST 90 tablet 3   Coenzyme Q10 (COQ10) 100 MG CAPS Take 100 mg by mouth daily.      Continuous Glucose Sensor (FREESTYLE LIBRE 3 SENSOR) MISC 1 Device by Does not apply route every 14 (fourteen) days. Place 1 sensor on the skin every 14 days. Use to check glucose continuously 6 each 3   dapagliflozin propanediol (FARXIGA) 10 MG TABS tablet Take 1 tablet (10 mg total) by mouth daily. 90 tablet 3   Evolocumab (REPATHA SURECLICK) 140 MG/ML SOAJ Inject 140 mg into the skin every  14 (fourteen) days. 6 mL 3   gabapentin (NEURONTIN) 300 MG capsule Take 2 (600 mg) capsules by mouth twice daily 120 capsule 1   glipiZIDE (GLUCOTROL) 5 MG tablet Take 1 tablet (5 mg total) by mouth daily. 90 tablet 3   Iron, Ferrous Sulfate, 325 (65 Fe) MG TABS Take 325 mg by mouth 2 (two) times daily. 60 tablet 5   lisinopril (ZESTRIL) 40 MG tablet TAKE 1 TABLET BY MOUTH EVERY DAY 90 tablet 2   MELATONIN GUMMIES PO Take 2 each by mouth at bedtime.     meloxicam (MOBIC) 7.5 MG tablet Take 7.5 mg by mouth daily.     nebivolol (BYSTOLIC) 2.5 MG tablet TAKE 1 TABLET BY MOUTH EVERY DAY 90 tablet 3   Omega-3 350 MG CPDR Take 350 mg by mouth daily.     pantoprazole (PROTONIX) 40 MG tablet Take 1 tablet (40 mg total) by mouth 2 (two) times daily. 60 tablet 3   pioglitazone (ACTOS) 45  MG tablet Take 1 tablet (45 mg total) by mouth daily. 90 tablet 3   sildenafil (VIAGRA) 100 MG tablet Take 1 tablet (100 mg total) by mouth daily as needed for erectile dysfunction. 10 tablet 1   sulfamethoxazole-trimethoprim (BACTRIM DS) 800-160 MG tablet TAKE 1 TABLET BY MOUTH 2 (TWO) TIMES DAILY AS NEEDED (URINE SYMPTOMS). 60 tablet 2   tamsulosin (FLOMAX) 0.4 MG CAPS capsule TAKE 1 CAPSULE BY MOUTH EVERY DAY 90 capsule 3   traZODone (DESYREL) 100 MG tablet Take 1 tablet (100 mg total) by mouth at bedtime. 90 tablet 3   traZODone (DESYREL) 50 MG tablet TAKE 1 TABLET BY MOUTH EVERYDAY AT BEDTIME 90 tablet 2   nitroGLYCERIN (NITROSTAT) 0.4 MG SL tablet Place 1 tablet (0.4 mg total) under the tongue every 5 (five) minutes as needed for chest pain. 25 tablet 6   No current facility-administered medications for this visit.    Social History   Socioeconomic History   Marital status: Married    Spouse name: Not on file   Number of children: Not on file   Years of education: Not on file   Highest education level: Not on file  Occupational History   Not on file  Tobacco Use   Smoking status: Former    Current  packs/day: 0.00    Average packs/day: 0.3 packs/day for 10.0 years (2.5 ttl pk-yrs)    Types: Cigarettes    Start date: 11/25/1960    Quit date: 11/26/1970    Years since quitting: 52.8   Smokeless tobacco: Never  Vaping Use   Vaping status: Never Used  Substance and Sexual Activity   Alcohol use: No    Alcohol/week: 0.0 standard drinks of alcohol   Drug use: No   Sexual activity: Yes  Other Topics Concern   Not on file  Social History Narrative   Not on file   Social Drivers of Health   Financial Resource Strain: Low Risk  (10/13/2021)   Overall Financial Resource Strain (CARDIA)    Difficulty of Paying Living Expenses: Not hard at all  Food Insecurity: No Food Insecurity (10/13/2021)   Hunger Vital Sign    Worried About Running Out of Food in the Last Year: Never true    Ran Out of Food in the Last Year: Never true  Transportation Needs: No Transportation Needs (10/13/2021)   PRAPARE - Administrator, Civil Service (Medical): No    Lack of Transportation (Non-Medical): No  Physical Activity: Sufficiently Active (10/13/2021)   Exercise Vital Sign    Days of Exercise per Week: 5 days    Minutes of Exercise per Session: 30 min  Stress: No Stress Concern Present (10/13/2021)   [employer] of Occupational Health - Occupational Stress Questionnaire    Feeling of Stress : Not at all  Social Connections: Socially Integrated (10/13/2021)   Social Connection and Isolation Panel [NHANES]    Frequency of Communication with Friends and Family: More than three times a week    Frequency of Social Gatherings with Friends and Family: More than three times a week    Attends Religious Services: More than 4 times per year    Active Member of [employer] or Organizations: Yes    Attends Banker Meetings: More than 4 times per year    Marital Status: Married  Catering manager Violence: Not At Risk (10/13/2021)   Humiliation, Afraid, Rape, and Kick questionnaire    Fear of  Current or Ex-Partner: No  Emotionally Abused: No    Physically Abused: No    Sexually Abused: No    Family History  Problem Relation Age of Onset   Liver cancer Father    Cancer Brother    Cancer Brother    Cancer Brother    Coronary artery disease Other    Hypertension Other    Heart disease Mother    Colon cancer Neg Hx    Stomach cancer Neg Hx    Esophageal cancer Neg Hx    Socially he is married. He has 3 children 8 grandchildren and 4 great-grandchildren. There is no tobacco or alcohol use. He is now retired.   ROS General: Negative; No fevers, chills, or night sweats;  HEENT: Decrease in hearing ,No changes in vision  sinus congestion, difficulty swallowing Pulmonary: History of "black lung" Cardiovascular: See history of present illness GI: Negative; No nausea, vomiting, diarrhea, or abdominal pain GU: Negative; No dysuria, hematuria, or difficulty voiding Musculoskeletal: Negative; no myalgias, joint pain, or weakness Hematologic/Oncology: Negative; no easy bruising, bleeding Endocrine: Positive for diabetes mellitus Neuro: Positive for paresthesias in his legs. Skin: Negative; No rashes or skin lesions Psychiatric: Negative; No behavioral problems, depression Sleep: Positive for restless legs; No snoring, daytime sleepiness, hypersomnolence, bruxism, hypnogognic hallucinations, no cataplexy Other comprehensive 14 point system review is negative.   PE BP 130/70   Pulse (!) 55   Ht 5\' 9"  (1.753 m)   Wt 218 lb (98.9 kg)   SpO2 97%   BMI 32.19 kg/m   Repeat blood pressure by me was 138/70  Wt Readings from Last 3 Encounters:  09/30/23 218 lb (98.9 kg)  08/30/23 219 lb 6 oz (99.5 kg)  05/21/23 222 lb 12.8 oz (101.1 kg)    General: Alert, oriented, no distress.  Skin: normal turgor, no rashes, warm and dry HEENT: Normocephalic, atraumatic. Pupils equal round and reactive to light; sclera anicteric; extraocular muscles intact;  Nose without nasal septal  hypertrophy Mouth/Parynx benign; Mallinpatti scale 3 Neck: No JVD, no carotid bruits; normal carotid upstroke Lungs: clear to ausculatation and percussion; no wheezing or rales Chest wall: without tenderness to palpitation Heart: PMI not displaced, RRR, s1 s2 normal, 1/6 systolic murmur, no diastolic murmur, no rubs, gallops, thrills, or heaves Abdomen: soft, nontender; no hepatosplenomehaly, BS+; abdominal aorta nontender and not dilated by palpation. Back: no CVA tenderness Pulses 2+ Musculoskeletal: full range of motion, normal strength, no joint deformities Extremities: no clubbing cyanosis or edema, Homan's sign negative  Neurologic: grossly nonfocal; Cranial nerves grossly wnl Psychologic: Normal mood and affect  EKG Interpretation Date/Time:  Thursday September 30 2023 09:20:02 EDT Ventricular Rate:  55 PR Interval:  186 QRS Duration:  104 QT Interval:  464 QTC Calculation: 443 R Axis:   15  Text Interpretation: Sinus bradycardia Nonspecific T wave abnormality When compared with ECG of 05-Apr-2023 15:30, T wave inversion now evident in Anterior leads Confirmed by Nicki Guadalajara ([id number]) on 09/30/2023 9:42:16 AM    April 05, 2023 ECG (independently read by me): Sinus bradycardia at 59, QTc 481 msec   August 12, 2022 ECG (independently read by me): Sinus bradycardia at 56 bpm.  No ectopy.  Normal intervals  I personally reviewed his ECG from January 14, 2022 which showed sinus rhythm at 78.  There are inferior Q waves with T wave inversion in 3 and aVF  Nov 21, 2021 ECG (independently read by me): Sinus bradycardia at 53,small Q III,no ectopy   October 20, 2021 ECG (independently read  by me): Sinus bradycardia at 53; small Q III t wave abnormality III, aVF  February 2022 ECG (independently read by me): Sinus Bradycardia at 57; early transition  August 2021 ECG (independently read by me): Sinus bradycardia 57 bpm.  Early transition.  No ectopy.  No ST segment changes.  March 2021  ECG (independently read by me): Sinus bradycardia 59 bpm.  Nonspecific ST abnormality.  Normal intervals.  No ectopy.  December 2019 ECG (independently read by me): Normal sinus rhythm at 61 bpm.  Early transition.  No ectopy.  No ST segment changes.  October 2019 ECG (independently read by me): Sinus bradycardia 53 bpm.  Early transition.  No ectopy.  March 2019 ECG (independently read by me): Sinus bradycardia 59 bpm.  Early transition.  Normal intervals.  October 2018 ECG (independently read by me): Normal sinus rhythm at 60 bpm.  Nonspecific T wave abnormality.  Normal intervals.  No ectopy.  May 2018 ECG (independently read by me): Sinus bradycardia 59 bpm.  No significant ST-T changes.  Early transition.  April 2017 ECG (independently read by me): Sinus bradycardia at 53 bpm.  Q waves in III and F.  March 2016 ECG (independently read by me): Normal sinus rhythm at 67.  No ectopy.  November 2015 ECG (independently read by me): Normal sinus rhythm at 65 bpm.  Early transition.  No significant ST segment changes.  Normal intervals.  Prior ECG: Normal sinus rhythm at 58 beats per minute. QTc interval 428 ms. PR interval 170 ms. Nonspecific ST changes   LABS:     Latest Ref Rng & Units 08/30/2023    2:53 PM 01/06/2023   10:33 AM 11/09/2022    8:34 AM  BMP  Glucose 70 - 99 mg/dL 914  89  782   BUN 6 - 23 mg/dL 18  11  16    Creatinine 0.40 - 1.50 mg/dL 9.56  2.13  0.86   BUN/Creat Ratio 10 - 24   16   Sodium 135 - 145 mEq/L 142  140  143   Potassium 3.5 - 5.1 mEq/L 4.0  4.2  4.1   Chloride 96 - 112 mEq/L 104  104  106   CO2 19 - 32 mEq/L 27  29  23    Calcium 8.4 - 10.5 mg/dL 9.6  9.7  9.6        Latest Ref Rng & Units 08/30/2023    2:53 PM 11/09/2022    8:34 AM 08/10/2022    8:46 AM  Hepatic Function  Total Protein 6.0 - 8.3 g/dL 7.6  6.5  7.0   Albumin 3.5 - 5.2 g/dL 4.5  4.2  4.4   AST 0 - 37 U/L 22  23  23    ALT 0 - 53 U/L 22  20  20    Alk Phosphatase 39 - 117 U/L 69  83  96    Total Bilirubin 0.2 - 1.2 mg/dL 0.3  0.2  <5.7        Latest Ref Rng & Units 08/30/2023    2:53 PM 05/21/2023   11:41 AM 01/06/2023   10:33 AM  CBC  WBC 4.0 - 10.5 K/uL 5.8  4.8  14.1   Hemoglobin 13.0 - 17.0 g/dL 84.6  8.5 Repeated and verified X2.  9.8   Hematocrit 39.0 - 52.0 % 42.0  28.6  32.5   Platelets 150.0 - 400.0 K/uL 190.0  174.0  455.0    Lab Results  Component Value Date  MCV 91.1 08/30/2023   MCV 72.0 (L) 05/21/2023   MCV 76.2 (L) 01/06/2023    Lab Results  Component Value Date   TSH 2.550 08/10/2022   Lab Results  Component Value Date   HGBA1C 7.2 (A) 04/20/2023    BNP No results found for: "PROBNP"  Lipid Panel     Component Value Date/Time   CHOL 102 11/09/2022 0834   CHOL 173 05/24/2013 1049   TRIG 89 11/09/2022 0834   TRIG 185 (H) 05/24/2013 1049   HDL 46 11/09/2022 0834   HDL 39 (L) 05/24/2013 1049   CHOLHDL 2.2 11/09/2022 0834   CHOLHDL 2.4 01/11/2022 0029   VLDL 11 01/11/2022 0029   LDLCALC 39 11/09/2022 0834   LDLCALC 84 11/22/2017 0846   LDLCALC 97 05/24/2013 1049     RADIOLOGY: No results found.  LEXISCAN MYOVIEW 04/05/2020 Study Highlights    The left ventricular ejection fraction is normal (55-65%). Nuclear stress EF: 55%. Horizontal ST segment depression ST segment depression was noted during stress. No T wave inversion was noted during stress. The study is normal. This is a low risk study.   Similar to prior nuclear stress tests, there were very mild st depressions at peak infusion, but there is no clear perfusion defect seen on imaging. Normal EF.   Lexiscan Myoview: October 27, 2021   Findings are consistent with prior myocardial infarction. The study is intermediate risk.   No ST deviation was noted.   LV perfusion is abnormal. Defect 1: There is a small defect with moderate reduction in uptake present in the basal inferior location(s) that is fixed. There is abnormal wall motion in the defect area. Consistent with  infarction. Rotating planar data shows a linear artifact across the diaphragm which may account for inferior wall perfusion artifact, however in the setting of abnormal wall motion, may represent true infarction.   Left ventricular function is abnormal. Nuclear stress EF: 44 %. The left ventricular ejection fraction is moderately decreased (30-44%). End diastolic cavity size is moderately enlarged.   Prior study available for comparison from 04/05/2020. There are changes compared to prior study which appear to be new. The left ventricular ejection fraction has decreased.    CARDIAC CATH/PCI: 01/12/2022   Ost Cx to Prox Cx lesion is 40% stenosed.   Mid Cx lesion is 75% stenosed.   3rd Mrg lesion is 50% stenosed.   Dist RCA lesion is 100% stenosed with 100% stenosed side branch in RPAV.   RPDA lesion is 100% stenosed.   Mid LAD lesion is 100% stenosed.   A drug-eluting stent was successfully placed.   Post intervention, there is a 0% residual stenosis.   IMPRESSION: Successful DFR guided mid AV groove circumflex PCI and drug-eluting stenting.  The sequential LIMA to the diagonal and LAD were patent as was the left radial to a diagonal branch.  The PDA and PLA were occluded distally but these appear to be small and received collaterals from the distal circumflex.  After reviewing the films with Dr. Katrinka Blazing our consensus opinion was to intervene on the circumflex if it was physiologically significant and to reserve PDA/PLA intervention if he has ongoing chest pain.  He will need DAPT uninterrupted for at least 12 months.  The sheath will be removed once ACT falls below and 70 and pressure held.  He will be hydrated overnight.  He left the lab in stable condition.    Intervention      ECHO: 09/21/2023  1. Left  ventricular ejection fraction, by estimation, is 55 to 60%. The  left ventricle has normal function. The left ventricle demonstrates  regional wall motion abnormalities (see scoring  diagram/findings for  description). There is mild concentric left  ventricular hypertrophy. Left ventricular diastolic parameters are  consistent with Grade II diastolic dysfunction (pseudonormalization). The  E/e' is 9.5.   2. Right ventricular systolic function is normal. The right ventricular  size is mildly enlarged. The estimated right ventricular systolic pressure  is 19.0 mmHg.   3. Left atrial size was severely dilated.   4. Right atrial size was mildly dilated.   5. The mitral valve is normal in structure. Mild to moderate mitral valve  regurgitation.   6. The aortic valve is tricuspid. There is mild thickening of the aortic  valve. Aortic valve regurgitation is not visualized. Aortic valve  sclerosis is present, with no evidence of aortic valve stenosis.   7. The inferior vena cava is normal in size with greater than 50%  respiratory variability, suggesting right atrial pressure of 3 mmHg.   8. Evidence of atrial level shunting detected by color flow Doppler.  There is a small patent foramen ovale with predominantly left to right  shunting across the atrial septum.   Comparison(s): No significant change from prior study. Prior images  reviewed side by side. By retrospective review, the basal inferior and  inferoseptal hypokinesis was also present on the 2023 study.     MPRESSION:  1. Essential hypertension   2. Coronary artery disease involving coronary bypass graft of native heart with angina pectoris (HCC)   3. Hx of CABG   4. Elevated Lp(a)   5. Statin intolerance   6. Hyperlipidemia LDL goal < 50   7. Type 2 diabetes mellitus without complication, without long-term current use of insulin (HCC)   8. Iron deficiency   9. PFO (patent foramen ovale)     ASSESSMENT AND PLAN: [name] is a 73 year-old Caucasian male who underwent CABG revascularization surgery by Dr. [name] in November 2002.  Remotely he had noticed mild shortness of breath and fatigue  and never experienced classic substernal chest pressure.  A nuclear perfusion study in 2015 continued to show normal perfusion but he developed ST segment changes of 1-2 mm.  Amlodipine was added to his medical regimen and since institution he felt improved.   A 4-year follow-up nuclear stress test in 2019 in anticipation of potential future shoulder surgery was unchanged from previously and continued to show normal perfusion but again he developed asymptomatic ST depression which lasted 5 to 9 minutes into the recovery period for resolution.  He never had a shoulder surgery.  At his evaluation in August 2021 he was experiencing some episodes of chest discomfort.  A follow-up nuclear imaging study on April 05, 2020 was unchanged from his prior evaluation and remain low risk.  His most recent Lexiscan Myoview study from October 23, 2021 suggested a small basal inferior region of scar with EF slightly reduced compared to previously.  Due to recurrent chest pain, repeat cardiac catheterization on 01/12/2022 revealed 75% stenosis in the AV groove circumflex with patent grafts and with previously noted distal see occlusion in the PDA and PL vessel.  He underwent DFR guided PCI to the mid AV groove circumflex with insertion of a 2.5 x 12 mm Synergy stent placed by Dr. Allyson Sabal.   He is now back on PCSK9 inhibition with Repatha 140 mg every 2 weeks in addition to his  over-the-counter omega-3 fatty acid.  Lipid studies were from Nov 09, 2022 were markedly improved with total cholesterol 102, HDL 46, LDL 39 and triglycerides 89.  Presently, his blood pressure is stable on his current regimen of amlodipine 5 mg, lisinopril 40 mg, and nebivolol 2.5 mg daily.  He is diabetic on glipizide, Farxiga, and pioglitazone.  He is on pantoprazole for GERD and has iron deficiency on supplemental iron and also has B12 deficiency.  He will be undergoing colonoscopy and endoscopy by Dr. Leone Payor.  He will need to hold his DAPT for 5 days prior to  this procedure.  I reviewed his most recent echo Doppler study which not significantly changed from his 2023 assessment with normal global LV function, grade 2 diastolic dysfunction and wall motion abnormality in the basal inferior and inferoseptal region.  Of note a very small patent foramen ovale was detected.  I discussed with him my plans for retirement in several months.  I will transition him to the interventional care of Dr. Bryan Lemma with follow-up in October 2025.     Lennette Bihari, MD, [facility]  10/04/2023 10:56 AM

## 2023-09-30 NOTE — Patient Instructions (Signed)
 Medication Instructions:  No medication changes were made during today's visit.  *If you need a refill on your cardiac medications before your next appointment, please call your pharmacy*   Lab Work: No labs were ordered during today's visit.  If you have labs (blood work) drawn today and your tests are completely normal, you will receive your results only by: MyChart Message (if you have MyChart) OR A paper copy in the mail If you have any lab test that is abnormal or we need to change your treatment, we will call you to review the results.   Testing/Procedures: No procedures were ordered during today's visit.    Follow-Up: At [facility], you and your health needs are our priority.  As part of our continuing mission to provide you with exceptional heart care, we have created designated Provider Care Teams.  These Care Teams include your primary Cardiologist (physician) and Advanced Practice Providers (APPs -  Physician Assistants and Nurse Practitioners) who all work together to provide you with the care you need, when you need it.  We recommend signing up for the patient portal called "MyChart".  Sign up information is provided on this After Visit Summary.  MyChart is used to connect with patients for Virtual Visits (Telemedicine).  Patients are able to view lab/test results, encounter notes, upcoming appointments, etc.  Non-urgent messages can be sent to your provider as well.   To learn more about what you can do with MyChart, go to [email or web address].    Your next appointment:   8 month(s)  Provider:   Dr. Herbie Baltimore     Other Instructions [facility]  [address] [place], [place] [address] [facility] [phone]       1st Floor: - Lobby - Registration  - Pharmacy  - Lab - Cafe   2nd Floor: - PV Lab - Diagnostic Testing (echo, CT, nuclear med)   3rd Floor: - Vacant   4th Floor: - TCTS (cardiothoracic surgery) - AFib  Clinic - Structural Heart Clinic - Vascular Surgery  - Vascular Ultrasound   5th Floor: - [facility] (general and EP) - Clinical Pharmacy for coumadin, hypertension, lipid, weight-loss medications, and med management appointments      Valet parking services will be available as well.      [facility]  [address] [place], [place] [address] [facility] [phone]       1st Floor: - Lobby - Registration  - Pharmacy  - Lab - Cafe   2nd Floor: - PV Lab - Diagnostic Testing (echo, CT, nuclear med)   3rd Floor: - Vacant   4th Floor: - TCTS (cardiothoracic surgery) - AFib Clinic - Structural Heart Clinic - Vascular Surgery  - Vascular Ultrasound   5th Floor: - [facility] (general and EP) - Clinical Pharmacy for coumadin, hypertension, lipid, weight-loss medications, and med management appointments      Valet parking services will be available as well.     [facility]  [address] [place], [place]  [address] [facility] [phone]       1st Floor: - Lobby - Registration  - Pharmacy  - Lab - Cafe   2nd Floor: - PV Lab - Diagnostic Testing (echo, CT, nuclear med)   3rd Floor: - Vacant   4th Floor: - TCTS (cardiothoracic surgery) - AFib Clinic - Structural Heart Clinic - Vascular Surgery  - Vascular Ultrasound   5th Floor: - [facility] (general and  EP) - Clinical Pharmacy for coumadin, hypertension, lipid, weight-loss medications, and med management appointments      Valet parking services will be available as well.

## 2023-10-03 ENCOUNTER — Other Ambulatory Visit: Payer: Self-pay | Admitting: Family Medicine

## 2023-10-03 ENCOUNTER — Other Ambulatory Visit: Payer: Self-pay | Admitting: Cardiovascular Disease

## 2023-10-04 ENCOUNTER — Encounter: Payer: Self-pay | Admitting: Cardiovascular Disease

## 2023-10-05 NOTE — Progress Notes (Unsigned)
 [facility] History and Physical   Primary Care Physician:  Nelwyn Salisbury, MD   Reason for Procedure:   ***  Plan:    ***     HPI: [name] is a 73 y.o. male    history of CAD s/p 2002, repeat cath 01/2022 s/p PCI, on long-term aspirin and Plavix, diabetes, hypertension, presents for evaluation of anemia.   Last echo July 2023 with EF 60 to 65%, mild LVH, grade 1 diastolic dysfunction with aortic sclerosis.   Seen by PCP 05/2023 and generated worsening anemia was discovered positive Hemoccult.  Appears patient has had mild anemia with baseline around until July 2024.  Hemoglobin was found to be 10.8   01/06/2023: Hgb 9.8, MCV 76.2, RDW 17.8, platelets 155 05/21/2023: Hgb 8.5 MCV 72, RDW 15.1, platelets 174.  Iron 11, saturation 2%, ferritin 4.6, TIBC 551.6, vitamin B12 92, normal folate   He has a history of anemia, first noted in 2022, with hemoglobin levels gradually decreasing to 8.5 in November. The anemia is multifactorial, attributed to both iron and B12 deficiencies. He has been taking B12, initially as injections and now as over-the-counter tablets, which have improved his symptoms. He notes significant improvement in muscle and chest pain with B12 shots, stating 'every time I took a shot, every time I felt better.' He is also on iron supplementation and has had black stools since. No previous bleeding.   He is on blood thinners, including Plavix and aspirin, due to a cardiac stent placement, which complicates his anemia management.    He underwent a colonoscopy in 2007, which was normal. He is scheduled for another colonoscopy and endoscopy to investigate potential sources of gastrointestinal bleeding.   He takes B12 tablets twice daily and a multivitamin containing B12 and iron. He reports feeling generally well, except for a recent hip injury that has caused pain. No changes in bowel habits, nausea, vomiting, or heartburn.       PREVIOUS GI WORKUP     Colonoscopy in 2007 Dr. Leone Payor - No polyps - Mild diverticulosis     Past Medical History:  Diagnosis Date   CAD (coronary artery disease)    nuclear stress test-09/09/11 low risk scan EF61%, sees Dr. Tresa Endo    Chronic headache    Chronic neck pain    Diabetes mellitus    sees Dr. Lonzo Cloud   Hyperlipidemia    Hypertension 12/26/2008   ECHO- EF>55%   Insomnia     Past Surgical History:  Procedure Laterality Date   APPENDECTOMY     cervical spine injection     COLONOSCOPY  04-02-06    per [facility], clear, repeat in 10 yrs   CORONARY ARTERY BYPASS GRAFT  2002   4 vessels   CORONARY PRESSURE/FFR STUDY N/A 01/12/2022   Procedure: INTRAVASCULAR PRESSURE WIRE/FFR STUDY;  Surgeon: Runell Gess, MD;  Location: [facility];  Service: Cardiovascular;  Laterality: N/A;   CORONARY STENT INTERVENTION N/A 01/12/2022   Procedure: CORONARY STENT INTERVENTION;  Surgeon: Runell Gess, MD;  Location: [facility];  Service: Cardiovascular;  Laterality: N/A;   LEFT HEART CATH AND CORS/GRAFTS ANGIOGRAPHY N/A 01/12/2022   Procedure: LEFT HEART CATH AND CORS/GRAFTS ANGIOGRAPHY;  Surgeon: Runell Gess, MD;  Location: [facility];  Service: Cardiovascular;  Laterality: N/A;    Prior to Admission medications   Medication Sig Start Date End Date Taking? Authorizing Provider  ACCU-CHEK GUIDE TEST test strip USE TO TEST  BLOOD SUGAR EVERY DAY 08/24/23   Nelwyn Salisbury, MD  amLODipine (NORVASC) 5 MG tablet TAKE 1 AND 1/2 TABLETS DAILY BY MOUTH 08/24/23   Nelwyn Salisbury, MD  aspirin 81 MG tablet Take 81 mg by mouth daily.    [provider]  clonazePAM (KLONOPIN) 0.5 MG tablet TAKE 1 TABLET BY MOUTH TWICE A DAY AS NEEDED FOR ANXIETY 07/30/23   Nelwyn Salisbury, MD  clopidogrel (PLAVIX) 75 MG tablet TAKE 1 TABLET BY MOUTH EVERY DAY WITH BREAKFAST 10/04/23   Lennette Bihari, MD  Coenzyme Q10 (COQ10) 100 MG CAPS Take 100 mg by mouth daily.     [provider]   Continuous Glucose Sensor (FREESTYLE LIBRE 3 SENSOR) MISC 1 Device by Does not apply route every 14 (fourteen) days. Place 1 sensor on the skin every 14 days. Use to check glucose continuously 04/20/23   Shamleffer, Konrad Dolores, MD  dapagliflozin propanediol (FARXIGA) 10 MG TABS tablet Take 1 tablet (10 mg total) by mouth daily. 04/20/23   Shamleffer, Konrad Dolores, MD  Evolocumab (REPATHA SURECLICK) 140 MG/ML SOAJ Inject 140 mg into the skin every 14 (fourteen) days. 02/16/23   Lennette Bihari, MD  ferrous sulfate 325 (65 FE) MG tablet TAKE 325 MG BY MOUTH 2 (TWO) TIMES DAILY. 10/05/23   Nelwyn Salisbury, MD  gabapentin (NEURONTIN) 300 MG capsule Take 2 (600 mg) capsules by mouth twice daily 09/06/23   Nelwyn Salisbury, MD  glipiZIDE (GLUCOTROL) 5 MG tablet Take 1 tablet (5 mg total) by mouth daily. 04/20/23   Shamleffer, Konrad Dolores, MD  lisinopril (ZESTRIL) 40 MG tablet TAKE 1 TABLET BY MOUTH EVERY DAY 01/21/23   Lennette Bihari, MD  MELATONIN GUMMIES PO Take 2 each by mouth at bedtime.    [provider]  meloxicam (MOBIC) 7.5 MG tablet Take 7.5 mg by mouth daily.    [provider]  nebivolol (BYSTOLIC) 2.5 MG tablet TAKE 1 TABLET BY MOUTH EVERY DAY 05/05/23   Lennette Bihari, MD  nitroGLYCERIN (NITROSTAT) 0.4 MG SL tablet Place 1 tablet (0.4 mg total) under the tongue every 5 (five) minutes as needed for chest pain. 08/12/22 11/10/22  Lennette Bihari, MD  Omega-3 350 MG CPDR Take 350 mg by mouth daily.    [provider]  pantoprazole (PROTONIX) 40 MG tablet Take 1 tablet (40 mg total) by mouth 2 (two) times daily. 08/30/23   McMichael, Saddie Benders, PA-C  pioglitazone (ACTOS) 45 MG tablet Take 1 tablet (45 mg total) by mouth daily. 04/20/23   Shamleffer, Konrad Dolores, MD  sildenafil (VIAGRA) 100 MG tablet Take 1 tablet (100 mg total) by mouth daily as needed for erectile dysfunction. 04/19/23   Nelwyn Salisbury, MD  sulfamethoxazole-trimethoprim (BACTRIM DS) 800-160 MG  tablet TAKE 1 TABLET BY MOUTH 2 (TWO) TIMES DAILY AS NEEDED (URINE SYMPTOMS). 05/06/23   Nelwyn Salisbury, MD  tamsulosin (FLOMAX) 0.4 MG CAPS capsule TAKE 1 CAPSULE BY MOUTH EVERY DAY 05/26/23   Nelwyn Salisbury, MD  traZODone (DESYREL) 100 MG tablet Take 1 tablet (100 mg total) by mouth at bedtime. 04/08/23   Nelwyn Salisbury, MD  traZODone (DESYREL) 50 MG tablet TAKE 1 TABLET BY MOUTH EVERYDAY AT BEDTIME 09/09/23   Nelwyn Salisbury, MD    Current Outpatient Medications  Medication Sig Dispense Refill   ACCU-CHEK GUIDE TEST test strip USE TO TEST BLOOD SUGAR EVERY DAY 100 strip 3   amLODipine (NORVASC) 5 MG tablet  TAKE 1 AND 1/2 TABLETS DAILY BY MOUTH 135 tablet 3   aspirin 81 MG tablet Take 81 mg by mouth daily.     clonazePAM (KLONOPIN) 0.5 MG tablet TAKE 1 TABLET BY MOUTH TWICE A DAY AS NEEDED FOR ANXIETY 60 tablet 5   clopidogrel (PLAVIX) 75 MG tablet TAKE 1 TABLET BY MOUTH EVERY DAY WITH BREAKFAST 90 tablet 3   Coenzyme Q10 (COQ10) 100 MG CAPS Take 100 mg by mouth daily.      Continuous Glucose Sensor (FREESTYLE LIBRE 3 SENSOR) MISC 1 Device by Does not apply route every 14 (fourteen) days. Place 1 sensor on the skin every 14 days. Use to check glucose continuously 6 each 3   dapagliflozin propanediol (FARXIGA) 10 MG TABS tablet Take 1 tablet (10 mg total) by mouth daily. 90 tablet 3   Evolocumab (REPATHA SURECLICK) 140 MG/ML SOAJ Inject 140 mg into the skin every 14 (fourteen) days. 6 mL 3   ferrous sulfate 325 (65 FE) MG tablet TAKE 325 MG BY MOUTH 2 (TWO) TIMES DAILY. 180 tablet 1   gabapentin (NEURONTIN) 300 MG capsule Take 2 (600 mg) capsules by mouth twice daily 120 capsule 1   glipiZIDE (GLUCOTROL) 5 MG tablet Take 1 tablet (5 mg total) by mouth daily. 90 tablet 3   lisinopril (ZESTRIL) 40 MG tablet TAKE 1 TABLET BY MOUTH EVERY DAY 90 tablet 2   MELATONIN GUMMIES PO Take 2 each by mouth at bedtime.     meloxicam (MOBIC) 7.5 MG tablet Take 7.5 mg by mouth daily.     nebivolol (BYSTOLIC) 2.5  MG tablet TAKE 1 TABLET BY MOUTH EVERY DAY 90 tablet 3   nitroGLYCERIN (NITROSTAT) 0.4 MG SL tablet Place 1 tablet (0.4 mg total) under the tongue every 5 (five) minutes as needed for chest pain. 25 tablet 6   Omega-3 350 MG CPDR Take 350 mg by mouth daily.     pantoprazole (PROTONIX) 40 MG tablet Take 1 tablet (40 mg total) by mouth 2 (two) times daily. 60 tablet 3   pioglitazone (ACTOS) 45 MG tablet Take 1 tablet (45 mg total) by mouth daily. 90 tablet 3   sildenafil (VIAGRA) 100 MG tablet Take 1 tablet (100 mg total) by mouth daily as needed for erectile dysfunction. 10 tablet 1   sulfamethoxazole-trimethoprim (BACTRIM DS) 800-160 MG tablet TAKE 1 TABLET BY MOUTH 2 (TWO) TIMES DAILY AS NEEDED (URINE SYMPTOMS). 60 tablet 2   tamsulosin (FLOMAX) 0.4 MG CAPS capsule TAKE 1 CAPSULE BY MOUTH EVERY DAY 90 capsule 3   traZODone (DESYREL) 100 MG tablet Take 1 tablet (100 mg total) by mouth at bedtime. 90 tablet 3   traZODone (DESYREL) 50 MG tablet TAKE 1 TABLET BY MOUTH EVERYDAY AT BEDTIME 90 tablet 2   No current facility-administered medications for this visit.    Allergies as of 10/06/2023 - Review Complete 10/04/2023  Allergen Reaction Noted   Codeine Anaphylaxis 04/04/2007   Ambien [zolpidem tartrate] Other (See Comments) 01/12/2022   Crestor [rosuvastatin]  12/01/2012   Isordil [isosorbide nitrate] Other (See Comments) 07/05/2014   Lipitor [atorvastatin] Other (See Comments) 12/01/2012   Zetia [ezetimibe] Other (See Comments) 11/26/2021   Cipro [ciprofloxacin hcl] Hives and Rash    Victoza [liraglutide] Rash 08/22/2018    Family History  Problem Relation Age of Onset   Liver cancer Father    Cancer Brother    Cancer Brother    Cancer Brother    Coronary artery disease Other    Hypertension  Other    Heart disease Mother    Colon cancer Neg Hx    Stomach cancer Neg Hx    Esophageal cancer Neg Hx     Social History   Socioeconomic History   Marital status: Married    Spouse  name: Not on file   Number of children: Not on file   Years of education: Not on file   Highest education level: Not on file  Occupational History   Not on file  Tobacco Use   Smoking status: Former    Current packs/day: 0.00    Average packs/day: 0.3 packs/day for 10.0 years (2.5 ttl pk-yrs)    Types: Cigarettes    Start date: 11/25/1960    Quit date: 11/26/1970    Years since quitting: 52.8   Smokeless tobacco: Never  Vaping Use   Vaping status: Never Used  Substance and Sexual Activity   Alcohol use: No    Alcohol/week: 0.0 standard drinks of alcohol   Drug use: No   Sexual activity: Yes  Other Topics Concern   Not on file  Social History Narrative   Not on file   Social Drivers of Health   Financial Resource Strain: Low Risk  (10/13/2021)   Overall Financial Resource Strain (CARDIA)    Difficulty of Paying Living Expenses: Not hard at all  Food Insecurity: No Food Insecurity (10/13/2021)   Hunger Vital Sign    Worried About Running Out of Food in the Last Year: Never true    Ran Out of Food in the Last Year: Never true  Transportation Needs: No Transportation Needs (10/13/2021)   PRAPARE - Administrator, Civil Service (Medical): No    Lack of Transportation (Non-Medical): No  Physical Activity: Sufficiently Active (10/13/2021)   Exercise Vital Sign    Days of Exercise per Week: 5 days    Minutes of Exercise per Session: 30 min  Stress: No Stress Concern Present (10/13/2021)   [employer] of Occupational Health - Occupational Stress Questionnaire    Feeling of Stress : Not at all  Social Connections: Socially Integrated (10/13/2021)   Social Connection and Isolation Panel [NHANES]    Frequency of Communication with Friends and Family: More than three times a week    Frequency of Social Gatherings with Friends and Family: More than three times a week    Attends Religious Services: More than 4 times per year    Active Member of [employer] or Organizations:  Yes    Attends Engineer, structural: More than 4 times per year    Marital Status: Married  Catering manager Violence: Not At Risk (10/13/2021)   Humiliation, Afraid, Rape, and Kick questionnaire    Fear of Current or Ex-Partner: No    Emotionally Abused: No    Physically Abused: No    Sexually Abused: No    Review of Systems: Positive for *** All other review of systems negative except as mentioned in the HPI.  Physical Exam: Vital signs There were no vitals taken for this visit.  General:   Alert,  Well-developed, well-nourished, pleasant and cooperative in NAD Lungs:  Clear throughout to auscultation.   Heart:  Regular rate and rhythm; no murmurs, clicks, rubs,  or gallops. Abdomen:  Soft, nontender and nondistended. Normal bowel sounds.   Neuro/Psych:  Alert and cooperative. Normal mood and affect. A and O x 3   @Malik Ruffino  Sena Slate, MD, [facility] [phone] (pager) 10/05/2023 5:58 PM@

## 2023-10-06 ENCOUNTER — Ambulatory Visit: Payer: Medicare Other | Admitting: Internal Medicine

## 2023-10-06 ENCOUNTER — Encounter: Payer: Self-pay | Admitting: Internal Medicine

## 2023-10-06 VITALS — BP 122/59 | HR 55 | Temp 98.6°F | Resp 19 | Ht 69.0 in | Wt 219.0 lb

## 2023-10-06 DIAGNOSIS — K5730 Diverticulosis of large intestine without perforation or abscess without bleeding: Secondary | ICD-10-CM

## 2023-10-06 DIAGNOSIS — K2970 Gastritis, unspecified, without bleeding: Secondary | ICD-10-CM

## 2023-10-06 DIAGNOSIS — D509 Iron deficiency anemia, unspecified: Secondary | ICD-10-CM

## 2023-10-06 DIAGNOSIS — K648 Other hemorrhoids: Secondary | ICD-10-CM

## 2023-10-06 DIAGNOSIS — E538 Deficiency of other specified B group vitamins: Secondary | ICD-10-CM

## 2023-10-06 DIAGNOSIS — K2960 Other gastritis without bleeding: Secondary | ICD-10-CM

## 2023-10-06 DIAGNOSIS — K2950 Unspecified chronic gastritis without bleeding: Secondary | ICD-10-CM | POA: Diagnosis not present

## 2023-10-06 DIAGNOSIS — K31A12 Gastric intestinal metaplasia without dysplasia, involving the body (corpus): Secondary | ICD-10-CM

## 2023-10-06 DIAGNOSIS — K317 Polyp of stomach and duodenum: Secondary | ICD-10-CM | POA: Diagnosis not present

## 2023-10-06 MED ORDER — SODIUM CHLORIDE 0.9 % IV SOLN: 500.0000 mL | Freq: Once | INTRAVENOUS | Status: DC

## 2023-10-06 NOTE — Progress Notes (Signed)
 Report to PACU, RN, vss, BBS= Clear.

## 2023-10-06 NOTE — Op Note (Signed)
 [facility] Patient Name: [name] Procedure Date: 10/06/2023 2:05 PM MRN: [id number] Endoscopist: Iva Boop , MD, [id number] Age: 73 Referring MD:  Date of Birth: 07-07-1950 Gender: Male Account #: [id number] Procedure:                Colonoscopy Indications:              Positive fecal immunochemical test, Iron deficiency                            anemia Medicines:                Monitored Anesthesia Care Procedure:                Pre-Anesthesia Assessment:                           - Prior to the procedure, a History and Physical                            was performed, and patient medications and                            allergies were reviewed. The patient's tolerance of                            previous anesthesia was also reviewed. The risks                            and benefits of the procedure and the sedation                            options and risks were discussed with the patient.                            All questions were answered, and informed consent                            was obtained. Prior Anticoagulants: The patient                            last took Plavix (clopidogrel) 5 days prior to the                            procedure. ASA Grade Assessment: II - A patient                            with mild systemic disease. After reviewing the                            risks and benefits, the patient was deemed in                            satisfactory condition to undergo the procedure.  After obtaining informed consent, the colonoscope                            was passed under direct vision. Throughout the                            procedure, the patient's blood pressure, pulse, and                            oxygen saturations were monitored continuously. The                            Olympus CF-HQ190L ([id number]) Colonoscope was                            introduced through the anus and advanced to  the the                            cecum, identified by appendiceal orifice and                            ileocecal valve. The colonoscopy was performed                            without difficulty. The patient tolerated the                            procedure well. The quality of the bowel                            preparation was good. The ileocecal valve,                            appendiceal orifice, and rectum were photographed.                            The bowel preparation used was SUFLAVE via split                            dose instruction. Scope In: 2:20:50 PM Scope Out: 2:30:11 PM Scope Withdrawal Time: 0 hours 7 minutes 10 seconds  Total Procedure Duration: 0 hours 9 minutes 21 seconds  Findings:                 The perianal and digital rectal examinations were                            normal.                           Internal hemorrhoids were found. The hemorrhoids                            were small.  Multiple diverticula were found in the sigmoid                            colon.                           The exam was otherwise without abnormality on                            direct and retroflexion views. Complications:            No immediate complications. Estimated Blood Loss:     Estimated blood loss was minimal. Impression:               - Internal hemorrhoids. Likely source of iFOBT stool                           - Diverticulosis in the sigmoid colon.                           - The examination was otherwise normal on direct                            and retroflexion views. He has severe gastritis on                            EGD that I think is cause of B12 and iron                            deficiencies.                           - No specimens collected. Recommendation:           - Patient has a contact number available for                            emergencies. The signs and symptoms of potential                             delayed complications were discussed with the                            patient. Return to normal activities tomorrow.                            Written discharge instructions were provided to the                            patient.                           - Resume previous diet.                           - Continue present medications.                           -  Resume Plavix (clopidogrel) at prior dose                            tomorrow.                           - No repeat colonoscopy due to age and the absence                            of colonic polyps. Iva Boop, MD 10/06/2023 2:43:00 PM This report has been signed electronically.

## 2023-10-06 NOTE — Patient Instructions (Addendum)
 Please read handouts provided. Continue present medications. Await pathology results. Resume previous diet. Resume Plavix ( clopidogrel ) at prior dose tomorrow.   YOU HAD AN ENDOSCOPIC PROCEDURE TODAY AT THE [facility]:   Refer to the procedure report that was given to you for any specific questions about what was found during the examination.  If the procedure report does not answer your questions, please call your gastroenterologist to clarify.  If you requested that your care partner not be given the details of your procedure findings, then the procedure report has been included in a sealed envelope for you to review at your convenience later.  YOU SHOULD EXPECT: Some feelings of bloating in the abdomen. Passage of more gas than usual.  Walking can help get rid of the air that was put into your GI tract during the procedure and reduce the bloating. If you had a lower endoscopy (such as a colonoscopy or flexible sigmoidoscopy) you may notice spotting of blood in your stool or on the toilet paper. If you underwent a bowel prep for your procedure, you may not have a normal bowel movement for a few days.  Please Note:  You might notice some irritation and congestion in your nose or some drainage.  This is from the oxygen used during your procedure.  There is no need for concern and it should clear up in a day or so.  SYMPTOMS TO REPORT IMMEDIATELY:  Following lower endoscopy (colonoscopy or flexible sigmoidoscopy):  Excessive amounts of blood in the stool  Significant tenderness or worsening of abdominal pains  Swelling of the abdomen that is new, acute  Fever of 100F or higher  Following upper endoscopy (EGD)  Vomiting of blood or coffee ground material  New chest pain or pain under the shoulder blades  Painful or persistently difficult swallowing  New shortness of breath  Fever of 100F or higher  Black, tarry-looking stools  For urgent or emergent issues, a  gastroenterologist can be reached at any hour by calling [phone]. Do not use MyChart messaging for urgent concerns.    DIET:  We do recommend a small meal at first, but then you may proceed to your regular diet.  Drink plenty of fluids but you should avoid alcoholic beverages for 24 hours.  ACTIVITY:  You should plan to take it easy for the rest of today and you should NOT DRIVE or use heavy machinery until tomorrow (because of the sedation medicines used during the test).    FOLLOW UP: Our staff will call the number listed on your records the next business day following your procedure.  We will call around 7:15- 8:00 am to check on you and address any questions or concerns that you may have regarding the information given to you following your procedure. If we do not reach you, we will leave a message.     If any biopsies were taken you will be contacted by phone or by letter within the next 1-3 weeks.  Please call us at [phone] if you have not heard about the biopsies in 3 weeks.    SIGNATURES/CONFIDENTIALITY: You and/or your care partner have signed paperwork which will be entered into your electronic medical record.  These signatures attest to the fact that that the information above on your After Visit Summary has been reviewed and is understood.  Full responsibility of the confidentiality of this discharge information lies with you and/or your care-partner.The main finding here is gastritis - stomach  inflammation. Also had some tiny stomach polyps. I took biopsies today. This could be cause of low iron and B12.  The colonoscopy was ok. I did see hemorrhoids (likely source of blood in stool test) and diverticulosis.  Please read the handouts.   I will let you know results and plans.  I appreciate the opportunity to care for you. Iva Boop, MD, [facility]

## 2023-10-06 NOTE — Op Note (Addendum)
 [facility] Patient Name: [name] Procedure Date: 10/06/2023 2:06 PM MRN: [id number] Endoscopist: Iva Boop , MD, [id number] Age: 73 Referring MD:  Date of Birth: [birth year] Gender: Male Account #: [id number] Procedure:                Upper GI endoscopy Indications:              Iron deficiency anemia and B12 deficiency Medicines:                Monitored Anesthesia Care Procedure:                Pre-Anesthesia Assessment:                           - Prior to the procedure, a History and Physical                            was performed, and patient medications and                            allergies were reviewed. The patient's tolerance of                            previous anesthesia was also reviewed. The risks                            and benefits of the procedure and the sedation                            options and risks were discussed with the patient.                            All questions were answered, and informed consent                            was obtained. Prior Anticoagulants: The patient                            last took Plavix (clopidogrel) 5 days prior to the                            procedure. ASA Grade Assessment: II - A patient                            with mild systemic disease. After reviewing the                            risks and benefits, the patient was deemed in                            satisfactory condition to undergo the procedure.                           After obtaining informed consent, the endoscope was  passed under direct vision. Throughout the                            procedure, the patient's blood pressure, pulse, and                            oxygen saturations were monitored continuously. The                            GIF [id number] #[id number] was introduced through the                            mouth, and advanced to the second part of duodenum.                             The upper GI endoscopy was accomplished without                            difficulty. The patient tolerated the procedure                            well. Scope In: Scope Out: Findings:                 Diffuse severe inflammation characterized by                            erythema, atrophy, whitediscolration and                            granularity was found in the entire examined                            stomach. Biopsies were taken with a cold forceps                            for histology. Verification of patient                            identification for the specimen was done. Estimated                            blood loss was minimal.                           Multiple 1 to 2 mm sessile polyps with no stigmata                            of recent bleeding were found in the gastric body.                            Biopsies were taken with a cold forceps for                            histology. Verification of patient  identification                            for the specimen was done. Estimated blood loss was                            minimal.                           The exam was otherwise without abnormality.                           The cardia and gastric fundus were otherwise normal                            on retroflexion. Complications:            No immediate complications. Estimated Blood Loss:     Estimated blood loss was minimal. Impression:               - Mucosal changes suspicious for chronic gastritis.                            Biopsied. Looks like an inflamed atrophic                            gastritis. Sydney protocol used.                           - Multiple 1-2 mmgastric polyps distal body.                            Biopsied. Some removed.                           - The examination was otherwise normal. Recommendation:           - Patient has a contact number available for                            emergencies. The signs and symptoms of  potential                            delayed complications were discussed with the                            patient. Return to normal activities tomorrow.                            Written discharge instructions were provided to the                            patient.                           - Resume previous diet.                           -  Continue present medications. He has had reduced                            abdominal pain from pantoprazole.                           - Await pathology results.                           - See the other procedure note for documentation of                            additional recommendations. Iva Boop, MD 10/06/2023 2:39:41 PM This report has been signed electronically.

## 2023-10-06 NOTE — Progress Notes (Signed)
 Pt's states no medical or surgical changes since previsit or office visit.

## 2023-10-06 NOTE — Progress Notes (Signed)
 Called to room to assist during endoscopic procedure.  Patient ID and intended procedure confirmed with present staff. Received instructions for my participation in the procedure from the performing physician.

## 2023-10-07 ENCOUNTER — Other Ambulatory Visit: Payer: Self-pay | Admitting: Family Medicine

## 2023-10-07 ENCOUNTER — Telehealth: Payer: Self-pay

## 2023-10-07 NOTE — Telephone Encounter (Signed)
 Copied from CRM [id number]. Topic: Clinical - Medication Refill >> Oct 07, 2023  4:37 PM [name] wrote: Most Recent Primary Care Visit:  Provider: Johnella Moloney  Department: [facility]  Visit Type: NURSE VISIT  Date: 08/17/2023  Medication: meloxicam (MOBIC) 7.5 MG tablet  Has the patient contacted their pharmacy? No (Agent: If no, request that the patient contact the pharmacy for the refill. If patient does not wish to contact the pharmacy document the reason why and proceed with request.) (Agent: If yes, when and what did the pharmacy advise?) Patient hasn't had medication refilled for a few months and needed to see if it was okay with provider to refill.  Is this the correct pharmacy for this prescription? Yes If no, delete pharmacy and type the correct one.  This is the patient's preferred pharmacy:  [id number] [place], [place] - [address] [place] [address] Phone: [phone] Fax: [phone]  Has the prescription been filled recently? No  Is the patient out of the medication? Yes  Has the patient been seen for an appointment in the last year OR does the patient have an upcoming appointment? Yes  Can we respond through MyChart? Yes  Agent: Please be advised that Rx refills may take up to 3 business days. We ask that you follow-up with your pharmacy.

## 2023-10-07 NOTE — Telephone Encounter (Signed)
 No answer, left message to call if having any issues or concerns, B.Vale Mousseau RN

## 2023-10-09 ENCOUNTER — Other Ambulatory Visit: Payer: Self-pay | Admitting: Gastroenterology

## 2023-10-11 MED ORDER — MELOXICAM 7.5 MG PO TABS: 7.5000 mg | ORAL_TABLET | Freq: Every day | ORAL | 3 refills | Status: DC

## 2023-10-12 LAB — SURGICAL PATHOLOGY

## 2023-10-13 ENCOUNTER — Encounter: Payer: Self-pay | Admitting: Internal Medicine

## 2023-10-19 ENCOUNTER — Ambulatory Visit: Payer: Medicare Other | Admitting: Internal Medicine

## 2023-11-01 ENCOUNTER — Ambulatory Visit (INDEPENDENT_AMBULATORY_CARE_PROVIDER_SITE_OTHER): Admitting: Internal Medicine

## 2023-11-01 ENCOUNTER — Encounter: Payer: Self-pay | Admitting: Internal Medicine

## 2023-11-01 VITALS — BP 122/70 | HR 65 | Ht 69.0 in | Wt 219.0 lb

## 2023-11-01 DIAGNOSIS — E1165 Type 2 diabetes mellitus with hyperglycemia: Secondary | ICD-10-CM | POA: Diagnosis not present

## 2023-11-01 DIAGNOSIS — R609 Edema, unspecified: Secondary | ICD-10-CM | POA: Diagnosis not present

## 2023-11-01 DIAGNOSIS — E1142 Type 2 diabetes mellitus with diabetic polyneuropathy: Secondary | ICD-10-CM

## 2023-11-01 DIAGNOSIS — Z7984 Long term (current) use of oral hypoglycemic drugs: Secondary | ICD-10-CM | POA: Diagnosis not present

## 2023-11-01 DIAGNOSIS — E1159 Type 2 diabetes mellitus with other circulatory complications: Secondary | ICD-10-CM | POA: Diagnosis not present

## 2023-11-01 LAB — POCT GLUCOSE (DEVICE FOR HOME USE): Glucose Fasting, POC: 121 mg/dL — AB (ref 70–99)

## 2023-11-01 LAB — POCT GLYCOSYLATED HEMOGLOBIN (HGB A1C): Hemoglobin A1C: 6.9 % — AB (ref 4.0–5.6)

## 2023-11-01 MED ORDER — DAPAGLIFLOZIN PROPANEDIOL 10 MG PO TABS: 10.0000 mg | ORAL_TABLET | Freq: Every day | ORAL | 3 refills | Status: DC

## 2023-11-01 MED ORDER — GLIPIZIDE 5 MG PO TABS: 5.0000 mg | ORAL_TABLET | Freq: Every day | ORAL | 3 refills | Status: DC

## 2023-11-01 MED ORDER — PIOGLITAZONE HCL 45 MG PO TABS: 45.0000 mg | ORAL_TABLET | Freq: Every day | ORAL | 3 refills | Status: DC

## 2023-11-01 NOTE — Progress Notes (Signed)
 Name: [name]   Age/ Sex: 73 y.o. male   MRN/ DOB: [id number]/ 1951-03-24     PCP: Donley Furth, MD   Reason for Endocrinology Evaluation: Type 2 Diabetes Mellitus  Initial Endocrine Consultative Visit: 08/31/2018    PATIENT IDENTIFIER: [name] is a 73 y.o. male with a past medical history of T2DM,CAD (S/P CABG). The patient has followed with Endocrinology clinic since 08/31/2018 for consultative assistance with management of his diabetes.  DIABETIC HISTORY:  [name] was diagnosed with T2DM in 2010, he has been on Metformin  and Actos  for many years, he is intolerant to Victoza  due to rash. Januvia  has been ineffective , which he stopped 08/2018. He has never been on insulin  . His hemoglobin A1c has ranged from 6.1% in 2017, peaking at 10.4% in 2019  On his initial presentation his A1c was 8.5% . He was on Metformin  and Actos  . We added Farxiga    Glipizide  started 04/2020 Self stopped Metformin  due to diarrhea 09/2020  SUBJECTIVE:   During the last visit (04/20/2023): A1c 7.2%.       Today (11/01/2023): [name] is here for a  follow up visit on his diabetes.  He is accompanied by spouse today.  He checks his sugar occasionally. He did not bring his meter today.   He continues to follow-up with cardiology for HTN, dyslipidemia , CAD, s/p PCI 01/2022 for unstable angina  He was recently evaluated by GI with EGD/colonoscopy for iron  deficiency anemia  He has been drinking vinegar and salt and water at night Has occasional LE edema  Denies nausea or  vomiting  Denies constipation or diarrhea    HOME DIABETES REGIMEN:  Actos  45 mg daily  Farxiga  10 mg daily Glipizide  5 mg ,  1 tab before Supper    GLUCOSE LOG : Did not bring     DIABETIC COMPLICATIONS: Microvascular complications:  Neuropathy  Denies: ckd , retinopathy  Last eye exam: Completed 2023    Macrovascular complications:  CAD ( S/p CABG) , S/P PCI 01/2022 Denies:  PVD, CVA      HISTORY:  Past Medical History:  Past Medical History:  Diagnosis Date   CAD (coronary artery disease)    nuclear stress test-09/09/11 low risk scan EF61%, sees Dr. Loetta Ringer    Chronic headache    Chronic neck pain    Diabetes mellitus    sees Dr. Rosalea Collin   Hyperlipidemia    Hypertension 12/26/2008   ECHO- EF>55%   Insomnia    Past Surgical History:  Past Surgical History:  Procedure Laterality Date   APPENDECTOMY     cervical spine injection     COLONOSCOPY  04-02-06    per [facility], clear, repeat in 10 yrs   CORONARY ARTERY BYPASS GRAFT  2002   4 vessels   CORONARY PRESSURE/FFR STUDY N/A 01/12/2022   Procedure: INTRAVASCULAR PRESSURE WIRE/FFR STUDY;  Surgeon: Avanell Leigh, MD;  Location: [facility];  Service: Cardiovascular;  Laterality: N/A;   CORONARY STENT INTERVENTION N/A 01/12/2022   Procedure: CORONARY STENT INTERVENTION;  Surgeon: Avanell Leigh, MD;  Location: [facility];  Service: Cardiovascular;  Laterality: N/A;   LEFT HEART CATH AND CORS/GRAFTS ANGIOGRAPHY N/A 01/12/2022   Procedure: LEFT HEART CATH AND CORS/GRAFTS ANGIOGRAPHY;  Surgeon: Avanell Leigh, MD;  Location: [facility];  Service: Cardiovascular;  Laterality: N/A;   Social History:  reports that he quit smoking about 62  years ago. His smoking use included cigarettes. He started smoking about 62 years ago. He has a 2.5 pack-year smoking history. He has never used smokeless tobacco. He reports that he does not drink alcohol and does not use drugs. Family History:  Family History  Problem Relation Age of Onset   Heart disease Mother    Liver cancer Father    Cancer Brother    Cancer Brother    Cancer Brother    Coronary artery disease Other    Hypertension Other    Colon cancer Neg Hx    Stomach cancer Neg Hx    Esophageal cancer Neg Hx    Colon polyps Neg Hx    Rectal cancer Neg Hx      HOME MEDICATIONS: Allergies as of 11/01/2023        Reactions   Codeine Anaphylaxis   Pt. verbalized that he has taken and can take Hydrocodone , Oxycodone  w/o issue - codeine allergy only   Ambien [zolpidem Tartrate] Other (See Comments)   Headache    Cipro [ciprofloxacin Hcl] Hives, Rash   Crestor  [rosuvastatin ] Other (See Comments)   Myalgia    Isordil  [isosorbide  Nitrate] Other (See Comments)   Headaches   Lipitor [atorvastatin] Other (See Comments)   Myalgia    Victoza  [liraglutide ] Rash   Zetia  [ezetimibe ] Other (See Comments)   Myalgia         Medication List        Accurate as of November 01, 2023  8:08 AM. If you have any questions, ask your nurse or doctor.          Accu-Chek Guide Test test strip Generic drug: glucose blood USE TO TEST BLOOD SUGAR EVERY DAY   amLODipine  5 MG tablet Commonly known as: NORVASC  TAKE 1 AND 1/2 TABLETS DAILY BY MOUTH   aspirin  81 MG tablet Take 81 mg by mouth daily.   clonazePAM  0.5 MG tablet Commonly known as: KLONOPIN  TAKE 1 TABLET BY MOUTH TWICE A DAY AS NEEDED FOR ANXIETY   clopidogrel  75 MG tablet Commonly known as: PLAVIX  TAKE 1 TABLET BY MOUTH EVERY DAY WITH BREAKFAST   CoQ10 100 MG Caps Take 100 mg by mouth daily.   dapagliflozin  propanediol 10 MG Tabs tablet Commonly known as: Farxiga  Take 1 tablet (10 mg total) by mouth daily.   ferrous sulfate  325 (65 FE) MG tablet TAKE 325 MG BY MOUTH 2 (TWO) TIMES DAILY.   FreeStyle Libre 3 Sensor Misc 1 Device by Does not apply route every 14 (fourteen) days. Place 1 sensor on the skin every 14 days. Use to check glucose continuously   gabapentin  300 MG capsule Commonly known as: NEURONTIN  Take 2 (600 mg) capsules by mouth twice daily   glipiZIDE  5 MG tablet Commonly known as: GLUCOTROL  Take 1 tablet (5 mg total) by mouth daily.   lisinopril  40 MG tablet Commonly known as: ZESTRIL  TAKE 1 TABLET BY MOUTH EVERY DAY   MELATONIN GUMMIES PO Take 2 each by mouth at bedtime.   meloxicam  7.5 MG tablet Commonly known  as: MOBIC  Take 1 tablet (7.5 mg total) by mouth daily.   nebivolol  2.5 MG tablet Commonly known as: BYSTOLIC  TAKE 1 TABLET BY MOUTH EVERY DAY   nitroGLYCERIN  0.4 MG SL tablet Commonly known as: NITROSTAT  Place 1 tablet (0.4 mg total) under the tongue every 5 (five) minutes as needed for chest pain.   Omega-3 350 MG Cpdr Take 350 mg by mouth daily.   pantoprazole  40 MG tablet Commonly  known as: PROTONIX  TAKE 1 TABLET BY MOUTH TWICE A DAY   pioglitazone  45 MG tablet Commonly known as: ACTOS  Take 1 tablet (45 mg total) by mouth daily.   Repatha  SureClick 140 MG/ML Soaj Generic drug: Evolocumab  Inject 140 mg into the skin every 14 (fourteen) days.   sildenafil  100 MG tablet Commonly known as: Viagra  Take 1 tablet (100 mg total) by mouth daily as needed for erectile dysfunction.   sulfamethoxazole -trimethoprim  800-160 MG tablet Commonly known as: BACTRIM  DS TAKE 1 TABLET BY MOUTH 2 (TWO) TIMES DAILY AS NEEDED (URINE SYMPTOMS).   tamsulosin  0.4 MG Caps capsule Commonly known as: FLOMAX  TAKE 1 CAPSULE BY MOUTH EVERY DAY   traZODone  100 MG tablet Commonly known as: DESYREL  Take 1 tablet (100 mg total) by mouth at bedtime.   traZODone  50 MG tablet Commonly known as: DESYREL  TAKE 1 TABLET BY MOUTH EVERYDAY AT BEDTIME       PHYSICAL EXAM: VS: BP 122/70 (BP Location: Right Arm, Patient Position: Sitting, Cuff Size: Normal)   Pulse 65   Ht 5\' 9"  (1.753 m)   Wt 219 lb (99.3 kg)   SpO2 98%   BMI 32.34 kg/m    EXAM: General: Pt appears well and is in NAD  Lungs: Clear with good BS bilat   Heart: Auscultation: RRR   Extremities: BL LE: 1+pretibial edema normal   Mental Status: Judgment, insight: intact Mood and affect: no depression, anxiety, or agitation     DM Foot Exam 11/01/2023 The skin of the feet is intact without sores or ulcerations. The pedal pulses are 1+ on right and 1+ on left. The sensation is intact to a screening 5.07, 10 gram monofilament  bilaterally   DATA REVIEWED:  Lab Results  Component Value Date   HGBA1C 6.9 (A) 11/01/2023   HGBA1C 7.2 (A) 04/20/2023   HGBA1C 7.8 (A) 10/12/2022    Latest Reference Range & Units 08/30/23 14:53  Sodium 135 - 145 mEq/L 142  Potassium 3.5 - 5.1 mEq/L 4.0  Chloride 96 - 112 mEq/L 104  CO2 19 - 32 mEq/L 27  Glucose 70 - 99 mg/dL 542 (H)  BUN 6 - 23 mg/dL 18  Creatinine 7.06 - 2.37 mg/dL 6.28  Calcium  8.4 - 10.5 mg/dL 9.6  Alkaline Phosphatase 39 - 117 U/L 69  Albumin 3.5 - 5.2 g/dL 4.5  AST 0 - 37 U/L 22  ALT 0 - 53 U/L 22  Total Protein 6.0 - 8.3 g/dL 7.6  Total Bilirubin 0.2 - 1.2 mg/dL 0.3  GFR >31.51 mL/min 73.58   In office BG 121 mg/dL   ASSESSMENT / PLAN / RECOMMENDATIONS:   1) Type 2 Diabetes Mellitus, Optimally Controlled , With neuropathic and macrovascular complications - Most recent A1c of 6.9 %. Goal A1c < 7.0 %.     -A1c has been trending down - He has not been able to obtain freestyle libre - No glucose data today, spouse organizes his medications in the pillbox - No changes   MEDICATIONS: Take glipizide  5 mg , 1 tablet before supper Continue Farxiga  10 mg daily Continue Actos  45 mg daily   EDUCATION / INSTRUCTIONS: BG monitoring instructions: Patient is instructed to check his blood sugars 1 times a day, fasting . Call [facility] if: BG persistently < 70  I reviewed the Rule of 15 for the treatment of hypoglycemia in detail with the patient. Literature supplied.    2) Diabetic complications:  Eye: Does not have known diabetic retinopathy.  Neuro/ Feet: Does have known diabetic peripheral neuropathy. Renal: Patient does not have known baseline CKD. He is on an ACEI/ARB at present.   3.LE Edema:  - Patient has been drinking salt and water as well as vinegar at night under the impression that this would help with his diabetes.  I did advise the patient that he may continue to use vinegar as it does help reduce carbohydrate  absorption by one third, but I did advise him against the salt intake as this could cause lower extremity edema (the patient has been on pioglitazone  long-term without edema in the past) as well as could elevate his BP  F/U in 6 months    I spent 25 minutes preparing to see the patient by review of recent labs, imaging and procedures, obtaining and reviewing separately obtained history, communicating with the patient, ordering medications, tests or procedures, and documenting clinical information in the EHR including the differential Dx, treatment, and any further evaluation and other management    Signed electronically by: Natale Bail, MD  [facility] Endocrinology  [facility] [address] [place] [place], [place] [address] Phone: [phone] FAX: [phone]   CC: Donley Furth, MD [address] [place] [address] Phone: [phone]  Fax: [phone]  Return to Endocrinology clinic as below: Future Appointments  Date Time Provider Department Center  11/01/2023  8:50 AM Amandeep Hogston, Julian Obey, MD [facility] None  01/06/2024 10:40 AM Maurie Southern, DO [facility]

## 2023-11-01 NOTE — Patient Instructions (Addendum)
-   Continue  Glipizide  5 mg, 1 tablet before Supper  - Continue Actos  45 mg, 1 tablet  daily  - Continue Farxiga  10 mg once a day     HOW TO TREAT LOW BLOOD SUGARS (Blood sugar LESS THAN 70 MG/DL) Please follow the RULE OF 15 for the treatment of hypoglycemia treatment (when your (blood sugars are less than 70 mg/dL)   STEP 1: Take 15 grams of carbohydrates when your blood sugar is low, which includes:  3-4 GLUCOSE TABS  OR 3-4 OZ OF JUICE OR REGULAR SODA OR ONE TUBE OF GLUCOSE GEL    STEP 2: RECHECK blood sugar in 15 MINUTES STEP 3: If your blood sugar is still low at the 15 minute recheck --> then, go back to STEP 1 and treat AGAIN with another 15 grams of carbohydrates.

## 2023-11-02 ENCOUNTER — Encounter: Payer: Self-pay | Admitting: Internal Medicine

## 2023-11-02 LAB — MICROALBUMIN / CREATININE URINE RATIO
Creatinine, Urine: 73 mg/dL (ref 20–320)
Microalb Creat Ratio: 3 mg/g{creat} (ref <30)
Microalb, Ur: 0.2 mg/dL

## 2023-11-25 ENCOUNTER — Telehealth: Payer: Self-pay | Admitting: *Deleted

## 2023-11-25 NOTE — Telephone Encounter (Signed)
 Copied from CRM [id number]. Topic: Clinical - Medication Question >> Nov 25, 2023  2:12 PM [place] E wrote: Reason for CRM: Sarah, Pharmacologist with [facility], called stating that the patient has been flagged for a statin outreach. [name] questioning if the patient has any statin intolerances or statin allergies. Callback number for [name] is [phone] Ext. [phone].

## 2023-11-26 NOTE — Telephone Encounter (Signed)
 Left detailed message for [name] regarding Dr Alyne Babinski advise on pt not being able to take Statins due to muscle aches.

## 2023-11-26 NOTE — Telephone Encounter (Signed)
 Yes he cannot take statins due to muscle aches

## 2023-12-22 ENCOUNTER — Other Ambulatory Visit: Payer: Self-pay | Admitting: Family Medicine

## 2023-12-22 DIAGNOSIS — M5432 Sciatica, left side: Secondary | ICD-10-CM

## 2023-12-22 MED ORDER — GABAPENTIN 300 MG PO CAPS: ORAL_CAPSULE | ORAL | 1 refills | Status: DC

## 2023-12-22 NOTE — Telephone Encounter (Signed)
 Copied from CRM [id number]. Topic: Clinical - Medication Refill >> Dec 22, 2023  1:02 PM Baldo Levan wrote: Medication:  gabapentin  gabapentin  (NEURONTIN ) 300 MG capsule   Has the patient contacted their pharmacy? Yes- Pharmacy stated they could not get in touch  with the doctor (Agent: If no, request that the patient contact the pharmacy for the refill. If patient does not wish to contact the pharmacy document the reason why and proceed with request.) (Agent: If yes, when and what did the pharmacy advise?)  This is the patient's preferred pharmacy:  [id number] [place], [place] - [address] [place] [address] Phone: [phone] Fax: [phone]    Is this the correct pharmacy for this prescription? Yes If no, delete pharmacy and type the correct one.   Has the prescription been filled recently? No  Is the patient out of the medication? Yes  Has the patient been seen for an appointment in the last year OR does the patient have an upcoming appointment? Yes  Can we respond through MyChart? Yes  Agent: Please be advised that Rx refills may take up to 3 business days. We ask that you follow-up with your pharmacy.

## 2024-01-05 ENCOUNTER — Other Ambulatory Visit: Payer: Self-pay | Admitting: Family Medicine

## 2024-01-06 ENCOUNTER — Encounter (INDEPENDENT_AMBULATORY_CARE_PROVIDER_SITE_OTHER): Admitting: Family Medicine

## 2024-01-06 NOTE — Progress Notes (Signed)
 error

## 2024-01-21 ENCOUNTER — Ambulatory Visit: Payer: Self-pay

## 2024-01-21 NOTE — Telephone Encounter (Signed)
 FYI Pt has appointment scheduled for Monday 01/24/24

## 2024-01-21 NOTE — Telephone Encounter (Signed)
 FYI Only or Action Required?: FYI only for provider.  Patient was last seen in primary care on 05/21/2023 by [name] LABOR, MD.  Called Nurse Triage reporting Leg Pain.  Symptoms began several years ago.  Interventions attempted: Other: Has seen PCP and ortho.  Symptoms are: gradually worsening.  Triage Disposition: See PCP When Office is Open (Within 3 Days)  Patient/caregiver understands and will follow disposition?: Yes                   Copied from CRM [id number]. Topic: Clinical - Red Word Triage >> Jan 21, 2024  8:40 AM [name] wrote: Red Word that prompted transfer to Nurse Triage:patient wife called in stating the patient is having left hip pain and leg pain and having a hard time getting around Reason for Disposition  [1] MODERATE pain (e.g., interferes with normal activities, limping) AND [2] present > 3 days  Answer Assessment - Initial Assessment Questions 1. ONSET: When did the pain start?      2 years 2. LOCATION: Where is the pain located?      Left leg 3. PAIN: How bad is the pain?    (Scale 1-10; or mild, moderate, severe)     A lot 4. WORK OR EXERCISE: Has there been any recent work or exercise that involved this part of the body?      no 5. CAUSE: What do you think is causing the leg pain?     Disc issues 6. OTHER SYMPTOMS: Do you have any other symptoms? (e.g., chest pain, back pain, breathing difficulty, swelling, rash, fever, numbness, weakness)     no  Protocols used: Leg Pain-A-AH

## 2024-01-24 ENCOUNTER — Encounter: Payer: Self-pay | Admitting: Family Medicine

## 2024-01-24 ENCOUNTER — Ambulatory Visit (INDEPENDENT_AMBULATORY_CARE_PROVIDER_SITE_OTHER): Admitting: Family Medicine

## 2024-01-24 VITALS — BP 128/60 | HR 54 | Temp 97.8°F | Wt 217.6 lb

## 2024-01-24 DIAGNOSIS — S76012A Strain of muscle, fascia and tendon of left hip, initial encounter: Secondary | ICD-10-CM | POA: Diagnosis not present

## 2024-01-24 NOTE — Progress Notes (Signed)
   Subjective:    Patient ID: [name], male    DOB: [birth year], 73 y.o.   MRN: [id number]  HPI Here complaining of intermittent pain in the anterior left hip for the past 2 years. No hx of trauma. The pain has been more persistent lately.    Review of Systems  Constitutional: Negative.   Respiratory: Negative.    Cardiovascular: Negative.   Musculoskeletal:  Positive for arthralgias.       Objective:   Physical Exam Constitutional:      Appearance: Normal appearance.  Cardiovascular:     Rate and Rhythm: Normal rate and regular rhythm.     Pulses: Normal pulses.     Heart sounds: Normal heart sounds.  Musculoskeletal:     Comments: He is tender over the insertion of the left hip flexor to the pelvis   Neurological:     Mental Status: He is alert.           Assessment & Plan:  Hip flexor strain. We will refer him to PT.  Garnette Olmsted, MD

## 2024-01-27 ENCOUNTER — Other Ambulatory Visit: Payer: Self-pay | Admitting: Family Medicine

## 2024-01-27 DIAGNOSIS — M5432 Sciatica, left side: Secondary | ICD-10-CM

## 2024-01-28 ENCOUNTER — Ambulatory Visit: Attending: Family Medicine | Admitting: Physical Therapy

## 2024-01-28 ENCOUNTER — Encounter: Payer: Self-pay | Admitting: Physical Therapy

## 2024-01-28 ENCOUNTER — Other Ambulatory Visit: Payer: Self-pay

## 2024-01-28 DIAGNOSIS — M25552 Pain in left hip: Secondary | ICD-10-CM | POA: Insufficient documentation

## 2024-01-28 DIAGNOSIS — S76012A Strain of muscle, fascia and tendon of left hip, initial encounter: Secondary | ICD-10-CM | POA: Diagnosis not present

## 2024-01-28 DIAGNOSIS — M6281 Muscle weakness (generalized): Secondary | ICD-10-CM | POA: Insufficient documentation

## 2024-01-28 DIAGNOSIS — M25652 Stiffness of left hip, not elsewhere classified: Secondary | ICD-10-CM | POA: Diagnosis not present

## 2024-01-28 DIAGNOSIS — R269 Unspecified abnormalities of gait and mobility: Secondary | ICD-10-CM | POA: Diagnosis not present

## 2024-01-28 NOTE — Patient Instructions (Signed)

## 2024-01-28 NOTE — Therapy (Signed)
 OUTPATIENT PHYSICAL THERAPY LOWER EXTREMITY EVALUATION   Patient Name: [name] MRN: [id number] DOB:March 03, 1951, 73 y.o., male Today's Date: 01/28/2024  END OF SESSION:  PT End of Session - 01/28/24 0848     Visit Number 1    Date for PT Re-Evaluation 03/24/24    Authorization Type BCBS Medicare    PT Start Time [id number]    PT Stop Time 0930    PT Time Calculation (min) 43 min    Activity Tolerance Patient tolerated treatment well          Past Medical History:  Diagnosis Date   CAD (coronary artery disease)    nuclear stress test-09/09/11 low risk scan EF61%, sees Dr. Burnard    Chronic headache    Chronic neck pain    Diabetes mellitus    sees Dr. Sam   Hyperlipidemia    Hypertension 12/26/2008   ECHO- EF>55%   Insomnia    Past Surgical History:  Procedure Laterality Date   APPENDECTOMY     cervical spine injection     COLONOSCOPY  04-02-06    per [facility], clear, repeat in 10 yrs   CORONARY ARTERY BYPASS GRAFT  2002   4 vessels   CORONARY PRESSURE/FFR STUDY N/A 01/12/2022   Procedure: INTRAVASCULAR PRESSURE WIRE/FFR STUDY;  Surgeon: Court Dorn PARAS, MD;  Location: [facility];  Service: Cardiovascular;  Laterality: N/A;   CORONARY STENT INTERVENTION N/A 01/12/2022   Procedure: CORONARY STENT INTERVENTION;  Surgeon: Court Dorn PARAS, MD;  Location: [facility];  Service: Cardiovascular;  Laterality: N/A;   LEFT HEART CATH AND CORS/GRAFTS ANGIOGRAPHY N/A 01/12/2022   Procedure: LEFT HEART CATH AND CORS/GRAFTS ANGIOGRAPHY;  Surgeon: Court Dorn PARAS, MD;  Location: [facility];  Service: Cardiovascular;  Laterality: N/A;   Patient Active Problem List   Diagnosis Date Noted   Pain in left hip 02/26/2022   Unstable angina (HCC) 01/10/2022   BPH with urinary obstruction 08/05/2021   Erectile dysfunction 08/05/2021   Type 2 diabetes mellitus without complication, without long-term current use of insulin  (HCC) 08/04/2017    Osteoarthritis 04/28/2017   Insomnia 03/03/2017   Fatigue 05/28/2014   Anxiety state 06/09/2010   NECK PAIN 09/03/2009   LEG PAIN 07/16/2008   HEADACHE 07/16/2008   LOSS, HEARING NOS 05/03/2007   Hyperlipidemia 04/04/2007   Essential hypertension 04/04/2007   Coronary atherosclerosis 04/04/2007    PCP: Johnny Senior MD  REFERRING PROVIDER: Johnny Senior MD  REFERRING DIAG: S76.012A hip strain, left  THERAPY DIAG:  Left hip pain; left hip stiffness; weakness  Rationale for Evaluation and Treatment: Rehabilitation  ONSET DATE: 3years  SUBJECTIVE:   SUBJECTIVE STATEMENT: Started 3 years ago with a bout of left sciatica.  He later felt a pop releasing the pain that went all the way down to the ankle but he continues to have severe left anterior hip pain, lateral hip pain and buttock pain.  He shows the pain location in the classic C formation with his thumb at the hip flexor insertion site and other fingers wrapping around his hip to the posterior side of his hip.   PERTINENT HISTORY: 2 bulging discs in the low back, cervical spine; issue; Heart stint; on a blood thinner;  had quadruple bypass years ago, diabetes; Restless leg syndrome; torn right rotator cuff (no surgery) Married 56 years  PAIN:  Are you having pain? Yes NPRS scale: 8/10 Pain location: left hip flexor, C formation, glutes Pain orientation: Left  PAIN TYPE: aching, sharp, and throbbing Pain description: constant  Aggravating factors: hip flexion, seated flexion, assists LE with his hands to get in/out of the car;  left sidelying (but sometimes can); walking on incline; has to plan how to kneel at the alter to pray Relieving factors: level walking  Standing, walking OK; sits with pillows under hip   PRECAUTIONS: None   WEIGHT BEARING RESTRICTIONS: No  FALLS:  Has patient fallen in last 6 months? No  LIVING ENVIRONMENT: Lives with: lives with their spouse Lives in: House/apartment   OCCUPATION:  pastor, enjoys auto mechanical work; does yard work Estate manager/land agent  PLOF: Independent  PATIENT GOALS: I don't want to have a hip replacement, I want to go out of this world with the same bones I came in with.  continue to be an outdoorsy person; work on cars; play guitar    OBJECTIVE:  Note: Objective measures were completed at Evaluation unless otherwise noted.  DIAGNOSTIC FINDINGS: unsure if he had a hip x-ray probably at some point; unable to find a report in [facility] medical records  PATIENT SURVEYS:  LEFS 7/25: 33/80 indicating quite a bit of difficulty with ADLS    COGNITION: Overall cognitive status: Within functional limits for tasks assessed   PALPATION: Tender left hip flexor tendon origin; tender points left gluteals and piriformis No relief with hip long axis distraction, able to tolerate distraction oscillations with hip at 45 degrees flexion  LOWER EXTREMITY MNF:ejupzwu is very painful with lying supine, he is unable to full straighten left leg; he is a little more comfortable with bolster behind the knee;  passively the therapist is able flex his hip to 45 degrees but he is unable to do actively (painful and weak); unable to even attempt seated figure 4 on left, can do on the right side but hurts on the left when he does this motion  LOWER EXTREMITY MMT: painful and weak with attempted sidelying hip abduction on left 2+/5;  standing SLS needs UE support and obvious pelvic drop, lateral trunk lean  MMT Right eval Left eval  Hip flexion 5  2-  Hip extension    Hip abduction 5 2  Hip adduction    Hip internal rotation    Hip external rotation  2  Knee flexion    Knee extension 5   Ankle dorsiflexion    Ankle plantarflexion    Ankle inversion    Ankle eversion     (Blank rows = not tested)   FUNCTIONAL TESTS:  Needs UE assist and very painful with sitting to supine and lying to sitting transitions Needs UE assist with sit to stand and heavily leans to  the right with rising Picks item up from the floor with straight knees bending at the hips/trunk   GAIT:  Comments: very antalgic with decreased stance time on the left, no left hip extension and tight lateral trunk lean  TREATMENT DATE: 01/28/24: evaluation  Discussion of possible treatment interventions Gave pt info on home TENS unit (has used in the past but no longer has) Discussed DN pt will consider   PATIENT EDUCATION:  Education details: Educated patient on anatomy and physiology of current symptoms, prognosis, plan of care as well as initial self care strategies to promote recovery Person educated: Patient Education method: Explanation Education comprehension: verbalized understanding  HOME EXERCISE PROGRAM: To be started  ASSESSMENT:  CLINICAL IMPRESSION: Patient is a 73 y.o. male who was seen today for physical therapy evaluation and treatment for left hip strain. Symptoms began 3 years ago with a bout of sciatica.  He no longer has sciatic pain down the leg but he continues to have left anterior hip pain that wraps around his hip to the posterior aspect.  The pain is severe requiring the use of his hands to get his leg in/out of the car and on/off the bed.  Markedly antalgic gait with decreased stance time on left and lack of hip extension.  With sit to stand he shifts to the right with most of his weight on the right leg.  Unable to achieve 0 degrees of hip extension in supine for sidelying.  Weak and painful with hip abduction.  He is limited functionally with kneeling at the alter (pt is a Education officer, environmental), has difficulty walking his dog on an incline and has difficulty putting on his socks and shoes.    OBJECTIVE IMPAIRMENTS: Abnormal gait, decreased activity tolerance, decreased mobility, difficulty walking, decreased ROM, decreased strength,  hypomobility, increased fascial restrictions, impaired perceived functional ability, impaired flexibility, and pain.   ACTIVITY LIMITATIONS: bending, sitting, standing, squatting, sleeping, bed mobility, dressing, hygiene/grooming, and locomotion level  PARTICIPATION LIMITATIONS: driving, community activity, occupation, and yard work  PERSONAL FACTORS: Past/current experiences, Time since onset of injury/illness/exacerbation, and 3+ comorbidities: lumbar spine DDD, cardiac history, diabetes are also affecting patient's functional outcome.   REHAB POTENTIAL: Good  CLINICAL DECISION MAKING: Moderate  EVALUATION COMPLEXITY: Moderate complexity   GOALS: Goals reviewed with patient? Yes  SHORT TERM GOALS: Target date: 02/25/2024  The patient will demonstrate knowledge of basic self care strategies and exercises to promote healing  Baseline: Goal status: INITIAL  2.  The patient will report a 30% improvement in pain levels with functional activities which are currently difficult including getting his left leg in/out of the car and bed Baseline:  Goal status: INITIAL  3.  Improved left hip extension to 5 degrees need for walking his dog on a incline Baseline:  Goal status: INITIAL  4.  The patient will have improved hip strength to at least 3+/5 needed for standing, walking, working on his cars and operating his mower Baseline:  Goal status: INITIAL     LONG TERM GOALS: Target date: 03/24/2024   The patient will be independent in a safe self progression of a home exercise program to promote further recovery of function  Baseline:  Goal status: INITIAL  2.  The patient will report a 60% improvement in pain levels with functional activities which are currently difficult including getting his leg in/out of the car and bed Baseline:  Goal status: INITIAL  3.   Improved left hip flexion and external rotation to put on socks and shoes on the left Baseline:  Goal status:  INITIAL  4.  Improved hip pain, mobility and strength needed to kneel at the alter with greater ease Baseline:  Goal status: INITIAL  5.  LEFS outcome  score improved to   45  /80 indicating improved function with less pain Baseline:  Goal status: INITIAL    PLAN:  PT FREQUENCY: 2x/week  PT DURATION: 8 weeks  PLANNED INTERVENTIONS: 97164- PT Re-evaluation, 97110-Therapeutic exercises, 97530- Therapeutic activity, 97112- Neuromuscular re-education, 97535- Self Care, 02859- Manual therapy, [phone]- Aquatic Therapy, [phone]- Electrical stimulation (unattended), [phone]- Electrical stimulation (manual), [id number]- Ultrasound, [id number]- Ionotophoresis 4mg /ml Dexamethasone, 79439 (1-2 muscles), 20561 (3+ muscles)- Dry Needling, Patient/Family education, Balance training, Taping, Joint mobilization, Cryotherapy, and Moist heat  PLAN FOR NEXT SESSION: ionto if cert signed to left hip flexor origin, see if pt purchased TENS and offer further instruction if needed; KT tape; gentle left hip mobilizations/oscillations; soft tissue mobilization; hip ROM and strengthening as tolerated; patient will consider DN  Glade Pesa, PT 01/28/24 2:47 PM Phone: [phone] Fax: [phone]

## 2024-01-31 ENCOUNTER — Other Ambulatory Visit: Payer: Self-pay | Admitting: Family Medicine

## 2024-01-31 NOTE — Telephone Encounter (Signed)
 Copied from CRM [id number]. Topic: Clinical - Medication Refill >> Jan 31, 2024 12:10 PM Pinkey ORN wrote: Medication: clonazePAM  (KLONOPIN ) 0.5 MG tablet  Has the patient contacted their pharmacy? Yes (Agent: If no, request that the patient contact the pharmacy for the refill. If patient does not wish to contact the pharmacy document the reason why and proceed with request.) (Agent: If yes, when and what did the pharmacy advise?)  This is the patient's preferred pharmacy:  [id number] [place], [place] - [address] [place] [address] Phone: [phone] Fax: [phone]   Is this the correct pharmacy for this prescription? Yes If no, delete pharmacy and type the correct one.   Has the prescription been filled recently? No  Is the patient out of the medication? Yes  Has the patient been seen for an appointment in the last year OR does the patient have an upcoming appointment? Yes  Can we respond through MyChart? Yes  Agent: Please be advised that Rx refills may take up to 3 business days. We ask that you follow-up with your pharmacy.

## 2024-02-02 NOTE — Telephone Encounter (Signed)
 This RX was refilled on 01/31/24

## 2024-02-18 ENCOUNTER — Telehealth: Payer: Self-pay | Admitting: Physical Therapy

## 2024-02-18 ENCOUNTER — Ambulatory Visit: Attending: Family Medicine | Admitting: Physical Therapy

## 2024-02-18 DIAGNOSIS — M25652 Stiffness of left hip, not elsewhere classified: Secondary | ICD-10-CM | POA: Insufficient documentation

## 2024-02-18 DIAGNOSIS — M25552 Pain in left hip: Secondary | ICD-10-CM | POA: Insufficient documentation

## 2024-02-18 DIAGNOSIS — M6281 Muscle weakness (generalized): Secondary | ICD-10-CM | POA: Insufficient documentation

## 2024-02-18 DIAGNOSIS — R269 Unspecified abnormalities of gait and mobility: Secondary | ICD-10-CM | POA: Insufficient documentation

## 2024-02-18 DIAGNOSIS — S76012D Strain of muscle, fascia and tendon of left hip, subsequent encounter: Secondary | ICD-10-CM | POA: Insufficient documentation

## 2024-02-18 NOTE — Telephone Encounter (Signed)
 Phoned patient and spoke to his wife. Patient forgot about appointment. Reminded her that pt's next appt is 8/20 at 8:45 am.

## 2024-02-22 NOTE — Therapy (Incomplete)
 OUTPATIENT PHYSICAL THERAPY LOWER EXTREMITY TREATMENT   Patient Name: [name] MRN: [id number] DOB:1950/11/07, 73 y.o., male Today's Date: 02/22/2024  END OF SESSION:    Past Medical History:  Diagnosis Date   CAD (coronary artery disease)    nuclear stress test-09/09/11 low risk scan EF61%, sees Dr. Burnard    Chronic headache    Chronic neck pain    Diabetes mellitus    sees Dr. Sam   Hyperlipidemia    Hypertension 12/26/2008   ECHO- EF>55%   Insomnia    Past Surgical History:  Procedure Laterality Date   APPENDECTOMY     cervical spine injection     COLONOSCOPY  04-02-06    per [facility], clear, repeat in 10 yrs   CORONARY ARTERY BYPASS GRAFT  2002   4 vessels   CORONARY PRESSURE/FFR STUDY N/A 01/12/2022   Procedure: INTRAVASCULAR PRESSURE WIRE/FFR STUDY;  Surgeon: Court Dorn PARAS, MD;  Location: [facility];  Service: Cardiovascular;  Laterality: N/A;   CORONARY STENT INTERVENTION N/A 01/12/2022   Procedure: CORONARY STENT INTERVENTION;  Surgeon: Court Dorn PARAS, MD;  Location: [facility];  Service: Cardiovascular;  Laterality: N/A;   LEFT HEART CATH AND CORS/GRAFTS ANGIOGRAPHY N/A 01/12/2022   Procedure: LEFT HEART CATH AND CORS/GRAFTS ANGIOGRAPHY;  Surgeon: Court Dorn PARAS, MD;  Location: [facility];  Service: Cardiovascular;  Laterality: N/A;   Patient Active Problem List   Diagnosis Date Noted   Pain in left hip 02/26/2022   Unstable angina (HCC) 01/10/2022   BPH with urinary obstruction 08/05/2021   Erectile dysfunction 08/05/2021   Type 2 diabetes mellitus without complication, without long-term current use of insulin  (HCC) 08/04/2017   Osteoarthritis 04/28/2017   Insomnia 03/03/2017   Fatigue 05/28/2014   Anxiety state 06/09/2010   NECK PAIN 09/03/2009   LEG PAIN 07/16/2008   HEADACHE 07/16/2008   LOSS, HEARING NOS 05/03/2007   Hyperlipidemia 04/04/2007   Essential hypertension 04/04/2007   Coronary  atherosclerosis 04/04/2007    PCP: Johnny Senior MD  REFERRING PROVIDER: Johnny Senior MD  REFERRING DIAG: S76.012A hip strain, left  THERAPY DIAG:  Left hip pain; left hip stiffness; weakness  Rationale for Evaluation and Treatment: Rehabilitation  ONSET DATE: 3years  SUBJECTIVE:   SUBJECTIVE STATEMENT: ***   Started 3 years ago with a bout of left sciatica.  He later felt a pop releasing the pain that went all the way down to the ankle but he continues to have severe left anterior hip pain, lateral hip pain and buttock pain.  He shows the pain location in the classic C formation with his thumb at the hip flexor insertion site and other fingers wrapping around his hip to the posterior side of his hip.   PERTINENT HISTORY: 2 bulging discs in the low back, cervical spine; issue; Heart stint; on a blood thinner;  had quadruple bypass years ago, diabetes; Restless leg syndrome; torn right rotator cuff (no surgery) Married 56 years  PAIN:  Are you having pain? Yes NPRS scale: 8/10 Pain location: left hip flexor, C formation, glutes Pain orientation: Left  PAIN TYPE: aching, sharp, and throbbing Pain description: constant  Aggravating factors: hip flexion, seated flexion, assists LE with his hands to get in/out of the car;  left sidelying (but sometimes can); walking on incline; has to plan how to kneel at the alter to pray Relieving factors: level walking  Standing, walking OK; sits with pillows under hip   PRECAUTIONS: None  WEIGHT BEARING RESTRICTIONS: No  FALLS:  Has patient fallen in last 6 months? No  LIVING ENVIRONMENT: Lives with: lives with their spouse Lives in: House/apartment   OCCUPATION: pastor, enjoys auto mechanical work; does yard work Estate manager/land agent  PLOF: Independent  PATIENT GOALS: I don't want to have a hip replacement, I want to go out of this world with the same bones I came in with.  continue to be an outdoorsy person; work on cars;  play guitar    OBJECTIVE:  Note: Objective measures were completed at Evaluation unless otherwise noted.  DIAGNOSTIC FINDINGS: unsure if he had a hip x-ray probably at some point; unable to find a report in [facility] medical records  PATIENT SURVEYS:  LEFS 7/25: 33/80 indicating quite a bit of difficulty with ADLS    COGNITION: Overall cognitive status: Within functional limits for tasks assessed   PALPATION: Tender left hip flexor tendon origin; tender points left gluteals and piriformis No relief with hip long axis distraction, able to tolerate distraction oscillations with hip at 45 degrees flexion  LOWER EXTREMITY MNF:ejupzwu is very painful with lying supine, he is unable to full straighten left leg; he is a little more comfortable with bolster behind the knee;  passively the therapist is able flex his hip to 45 degrees but he is unable to do actively (painful and weak); unable to even attempt seated figure 4 on left, can do on the right side but hurts on the left when he does this motion  LOWER EXTREMITY MMT: painful and weak with attempted sidelying hip abduction on left 2+/5;  standing SLS needs UE support and obvious pelvic drop, lateral trunk lean  MMT Right eval Left eval  Hip flexion 5  2-  Hip extension    Hip abduction 5 2  Hip adduction    Hip internal rotation    Hip external rotation  2  Knee flexion    Knee extension 5   Ankle dorsiflexion    Ankle plantarflexion    Ankle inversion    Ankle eversion     (Blank rows = not tested)   FUNCTIONAL TESTS:  Needs UE assist and very painful with sitting to supine and lying to sitting transitions Needs UE assist with sit to stand and heavily leans to the right with rising Picks item up from the floor with straight knees bending at the hips/trunk   GAIT:  Comments: very antalgic with decreased stance time on the left, no left hip extension and tight lateral trunk lean                                                                                                                                 TREATMENT DATE:  02/23/24    01/28/24: evaluation  Discussion of possible treatment interventions Gave pt info on home TENS unit (has used in the past but no longer has) Discussed DN pt will consider  PATIENT EDUCATION:  Education details: Educated patient on anatomy and physiology of current symptoms, prognosis, plan of care as well as initial self care strategies to promote recovery Person educated: Patient Education method: Explanation Education comprehension: verbalized understanding  HOME EXERCISE PROGRAM: ***  ASSESSMENT:  CLINICAL IMPRESSION: ***  OBJECTIVE IMPAIRMENTS: Abnormal gait, decreased activity tolerance, decreased mobility, difficulty walking, decreased ROM, decreased strength, hypomobility, increased fascial restrictions, impaired perceived functional ability, impaired flexibility, and pain.   ACTIVITY LIMITATIONS: bending, sitting, standing, squatting, sleeping, bed mobility, dressing, hygiene/grooming, and locomotion level  PARTICIPATION LIMITATIONS: driving, community activity, occupation, and yard work  PERSONAL FACTORS: Past/current experiences, Time since onset of injury/illness/exacerbation, and 3+ comorbidities: lumbar spine DDD, cardiac history, diabetes are also affecting patient's functional outcome.   REHAB POTENTIAL: Good  CLINICAL DECISION MAKING: Moderate  EVALUATION COMPLEXITY: Moderate complexity   GOALS: Goals reviewed with patient? Yes  SHORT TERM GOALS: Target date: 02/25/2024  The patient will demonstrate knowledge of basic self care strategies and exercises to promote healing  Baseline: Goal status: INITIAL  2.  The patient will report a 30% improvement in pain levels with functional activities which are currently difficult including getting his left leg in/out of the car and bed Baseline:  Goal status: INITIAL  3.  Improved left hip extension  to 5 degrees need for walking his dog on a incline Baseline:  Goal status: INITIAL  4.  The patient will have improved hip strength to at least 3+/5 needed for standing, walking, working on his cars and operating his mower Baseline:  Goal status: INITIAL     LONG TERM GOALS: Target date: 03/24/2024   The patient will be independent in a safe self progression of a home exercise program to promote further recovery of function  Baseline:  Goal status: INITIAL  2.  The patient will report a 60% improvement in pain levels with functional activities which are currently difficult including getting his leg in/out of the car and bed Baseline:  Goal status: INITIAL  3.   Improved left hip flexion and external rotation to put on socks and shoes on the left Baseline:  Goal status: INITIAL  4.  Improved hip pain, mobility and strength needed to kneel at the alter with greater ease Baseline:  Goal status: INITIAL  5.  LEFS outcome score improved to   45  /80 indicating improved function with less pain Baseline:  Goal status: INITIAL    PLAN:  PT FREQUENCY: 2x/week  PT DURATION: 8 weeks  PLANNED INTERVENTIONS: 97164- PT Re-evaluation, 97110-Therapeutic exercises, 97530- Therapeutic activity, 97112- Neuromuscular re-education, 97535- Self Care, 02859- Manual therapy, [phone]- Aquatic Therapy, [phone]- Electrical stimulation (unattended), [phone]- Electrical stimulation (manual), [id number]- Ultrasound, [id number]- Ionotophoresis 4mg /ml Dexamethasone, 79439 (1-2 muscles), 20561 (3+ muscles)- Dry Needling, Patient/Family education, Balance training, Taping, Joint mobilization, Cryotherapy, and Moist heat  PLAN FOR NEXT SESSION: ionto if cert signed to left hip flexor origin, see if pt purchased TENS and offer further instruction if needed; KT tape; gentle left hip mobilizations/oscillations; soft tissue mobilization; hip ROM and strengthening as tolerated; patient will consider DN  Mliss Cummins, PT   02/22/24 8:37 PM Phone: [phone] Fax: [phone]

## 2024-02-23 ENCOUNTER — Ambulatory Visit: Admitting: Physical Therapy

## 2024-02-23 ENCOUNTER — Telehealth: Payer: Self-pay | Admitting: Physical Therapy

## 2024-02-23 NOTE — Telephone Encounter (Signed)
 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

## 2024-02-25 ENCOUNTER — Encounter: Admitting: Physical Therapy

## 2024-03-01 ENCOUNTER — Encounter: Admitting: Physical Therapy

## 2024-03-03 ENCOUNTER — Encounter: Admitting: Physical Therapy

## 2024-03-08 ENCOUNTER — Ambulatory Visit (INDEPENDENT_AMBULATORY_CARE_PROVIDER_SITE_OTHER): Admitting: Family Medicine

## 2024-03-08 ENCOUNTER — Encounter: Payer: Self-pay | Admitting: Family Medicine

## 2024-03-08 ENCOUNTER — Encounter: Admitting: Physical Therapy

## 2024-03-08 VITALS — BP 120/60 | HR 54 | Temp 97.9°F | Wt 216.0 lb

## 2024-03-08 DIAGNOSIS — M25552 Pain in left hip: Secondary | ICD-10-CM

## 2024-03-08 MED ORDER — SULFAMETHOXAZOLE-TRIMETHOPRIM 800-160 MG PO TABS: 1.0000 | ORAL_TABLET | Freq: Two times a day (BID) | ORAL | 2 refills | Status: DC | PRN

## 2024-03-08 MED ORDER — PREDNISONE 10 MG PO TABS: 20.0000 mg | ORAL_TABLET | Freq: Every day | ORAL | 2 refills | Status: DC | PRN

## 2024-03-08 MED ORDER — CLONAZEPAM 0.5 MG PO TABS: 0.5000 mg | ORAL_TABLET | Freq: Two times a day (BID) | ORAL | 5 refills | Status: AC | PRN

## 2024-03-08 NOTE — Progress Notes (Signed)
   Subjective:    Patient ID: [name], male    DOB: 07/03/1951, 73 y.o.   MRN: [id number]  HPI Here to follow up on left hip pain. He has had several visits with PT, but his pain has not improved at all. He takes ES Tylenol  several times a day, but this only helps a little. He has not seen Orthopedics for 3-4 years.    Review of Systems  Constitutional: Negative.   Respiratory: Negative.    Cardiovascular: Negative.   Musculoskeletal:  Positive for arthralgias and back pain.       Objective:   Physical Exam Constitutional:      Comments: Walks with a cane   Cardiovascular:     Rate and Rhythm: Normal rate and regular rhythm.     Pulses: Normal pulses.     Heart sounds: Normal heart sounds.  Pulmonary:     Effort: Pulmonary effort is normal.     Breath sounds: Normal breath sounds.  Neurological:     Mental Status: He is alert.           Assessment & Plan:  Chronic left hip pain. He will try taking 10-20 mg of Prednisone  as needed along with the Tylenol . We will refer him back to Dr. Addie for an orthopedics evaluation.  Garnette Olmsted, MD

## 2024-03-15 ENCOUNTER — Encounter: Admitting: Physical Therapy

## 2024-03-17 ENCOUNTER — Encounter: Admitting: Physical Therapy

## 2024-03-22 ENCOUNTER — Encounter: Admitting: Physical Therapy

## 2024-03-24 ENCOUNTER — Encounter: Admitting: Physical Therapy

## 2024-03-29 ENCOUNTER — Other Ambulatory Visit: Payer: Self-pay

## 2024-03-29 ENCOUNTER — Ambulatory Visit (INDEPENDENT_AMBULATORY_CARE_PROVIDER_SITE_OTHER): Admitting: Orthopedic Surgery

## 2024-03-29 ENCOUNTER — Other Ambulatory Visit (INDEPENDENT_AMBULATORY_CARE_PROVIDER_SITE_OTHER): Payer: Self-pay

## 2024-03-29 ENCOUNTER — Encounter: Admitting: Physical Therapy

## 2024-03-29 DIAGNOSIS — M79605 Pain in left leg: Secondary | ICD-10-CM

## 2024-03-29 DIAGNOSIS — M1612 Unilateral primary osteoarthritis, left hip: Secondary | ICD-10-CM

## 2024-03-29 DIAGNOSIS — M25552 Pain in left hip: Secondary | ICD-10-CM

## 2024-04-01 ENCOUNTER — Encounter: Payer: Self-pay | Admitting: Orthopedic Surgery

## 2024-04-01 MED ORDER — BUPIVACAINE HCL 0.25 % IJ SOLN
4.0000 mL | INTRAMUSCULAR | Status: AC | PRN
  Administered 2024-03-29: 4 mL via INTRA_ARTICULAR

## 2024-04-01 MED ORDER — LIDOCAINE HCL 1 % IJ SOLN
5.0000 mL | INTRAMUSCULAR | Status: AC | PRN
  Administered 2024-03-29: 5 mL

## 2024-04-01 MED ORDER — TRIAMCINOLONE ACETONIDE 40 MG/ML IJ SUSP
40.0000 mg | INTRAMUSCULAR | Status: AC | PRN
  Administered 2024-03-29: 40 mg via INTRA_ARTICULAR

## 2024-04-01 NOTE — Progress Notes (Addendum)
 Office Visit Note   Patient: [name]           Date of Birth: 1951-03-30           MRN: [id number] Visit Date: 03/29/2024 Requested by: Johnny Garnette LABOR, MD [address] [place],  [place] [address] PCP: Johnny Garnette LABOR, MD  Subjective: Chief Complaint  Patient presents with   Left Hip - Pain    HPI: [name] is a 73 y.o. male who presents to the office reporting left hip and mild back pain for 3 years.  Initially started out as low back pain with sciatic nerve problem.  Now he reports some radiating pain down the thigh into the knee but not below the knee.  Denies much in the way of back pain.  Does have groin pain.  States that his hip gets tight.  Prednisone  taper 3 weeks ago which helped him to walk better than he has in the last 2 years.  He is on anticoagulant.  Has a new stent in his heart.  Has type 2 diabetes.  Does use a cane and his wife states that he limps at times.  The pain does wake him from sleep at night.  Blood glucose typically runs in the 160-200 range.  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  He is retired from [employer]                ROS: All systems reviewed are negative as they relate to the chief complaint within the history of present illness.  Patient denies fevers or chills.  Assessment & Plan: Visit Diagnoses:  1. Pain in left hip   2. Pain in left leg     Plan: Impression is symptomatic left hip arthritis and a active 73 year old patient.  We will try ultrasound-guided injection into the hip today.  May help for a while but based on the severity of the arthritis it may be closer to weeks as opposed to months of relief.  We will see how he does.  Hip replacement could be in his future but that would be a decision he would have to make.  Follow-Up Instructions: No follow-ups on file.   Orders:  Orders Placed This Encounter  Procedures   XR HIP UNILAT W OR W/O PELVIS 2-3 VIEWS LEFT   XR Lumbar Spine 2-3  Views   US  Guided Needle Placement - No Linked Charges   No orders of the defined types were placed in this encounter.     Procedures: Large Joint Inj: L hip joint on 03/29/2024 11:10 PM Indications: pain and diagnostic evaluation Details: 22 G 3.5 in needle, ultrasound-guided lateral approach  Arthrogram: No  Medications: 5 mL lidocaine  1 %; 4 mL bupivacaine 0.25 %; 40 mg triamcinolone acetonide 40 MG/ML Outcome: tolerated well, no immediate complications Procedure, treatment alternatives, risks and benefits explained, specific risks discussed. Consent was given by the patient. Immediately prior to procedure a time out was called to verify the correct patient, procedure, equipment, support staff and site/side marked as required. Patient was prepped and draped in the usual sterile fashion.       Clinical Data: No additional findings.  Objective: Vital Signs: There were no vitals taken for this visit.  Physical Exam:  Constitutional: Patient appears well-developed HEENT:  Head: Normocephalic Eyes:EOM are normal Neck: Normal range of motion Cardiovascular: Normal rate Pulmonary/chest: Effort normal Neurologic: Patient is alert Skin: Skin is  warm Psychiatric: Patient has normal mood and affect  Ortho Exam: Ortho exam demonstrates diminished and painful range of motion of the left hip compared to the right hip.  Leg lengths approximately equal.  Hip flexion abduction adduction strength is intact and symmetric between hips.  Pedal pulses palpable.  Ankle dorsiflexion intact on the left-hand side.  Does have groin pain with internal and external rotation of that left hip.  No nerve root tension signs or paresthesias L1-S1 bilaterally.  Specialty Comments:  No specialty comments available.  Imaging: No results found.   PMFS History: Patient Active Problem List   Diagnosis Date Noted   Pain in left hip 02/26/2022   Unstable angina (HCC) 01/10/2022   BPH with urinary  obstruction 08/05/2021   Erectile dysfunction 08/05/2021   Type 2 diabetes mellitus without complication, without long-term current use of insulin  (HCC) 08/04/2017   Osteoarthritis 04/28/2017   Insomnia 03/03/2017   Fatigue 05/28/2014   Anxiety state 06/09/2010   NECK PAIN 09/03/2009   LEG PAIN 07/16/2008   HEADACHE 07/16/2008   LOSS, HEARING NOS 05/03/2007   Hyperlipidemia 04/04/2007   Essential hypertension 04/04/2007   Coronary atherosclerosis 04/04/2007   Past Medical History:  Diagnosis Date   CAD (coronary artery disease)    nuclear stress test-09/09/11 low risk scan EF61%, sees Dr. Burnard    Chronic headache    Chronic neck pain    Diabetes mellitus    sees Dr. Sam   Hyperlipidemia    Hypertension 12/26/2008   ECHO- EF>55%   Insomnia     Family History  Problem Relation Age of Onset   Heart disease Mother    Liver cancer Father    Cancer Brother    Cancer Brother    Cancer Brother    Coronary artery disease Other    Hypertension Other    Colon cancer Neg Hx    Stomach cancer Neg Hx    Esophageal cancer Neg Hx    Colon polyps Neg Hx    Rectal cancer Neg Hx     Past Surgical History:  Procedure Laterality Date   APPENDECTOMY     cervical spine injection     COLONOSCOPY  04-02-06    per [facility], clear, repeat in 10 yrs   CORONARY ARTERY BYPASS GRAFT  2002   4 vessels   CORONARY PRESSURE/FFR STUDY N/A 01/12/2022   Procedure: INTRAVASCULAR PRESSURE WIRE/FFR STUDY;  Surgeon: Court Dorn PARAS, MD;  Location: [facility];  Service: Cardiovascular;  Laterality: N/A;   CORONARY STENT INTERVENTION N/A 01/12/2022   Procedure: CORONARY STENT INTERVENTION;  Surgeon: Court Dorn PARAS, MD;  Location: [facility];  Service: Cardiovascular;  Laterality: N/A;   LEFT HEART CATH AND CORS/GRAFTS ANGIOGRAPHY N/A 01/12/2022   Procedure: LEFT HEART CATH AND CORS/GRAFTS ANGIOGRAPHY;  Surgeon: Court Dorn PARAS, MD;  Location: [facility];  Service:  Cardiovascular;  Laterality: N/A;   Social History   Occupational History   Not on file  Tobacco Use   Smoking status: Former    Current packs/day: 0.00    Average packs/day: 0.3 packs/day for 10.0 years (2.5 ttl pk-yrs)    Types: Cigarettes    Start date: 11/25/1960    Quit date: 11/26/1970    Years since quitting: 53.3   Smokeless tobacco: Never  Vaping Use   Vaping status: Never Used  Substance and Sexual Activity   Alcohol use: No    Alcohol/week: 0.0 standard drinks of alcohol  Drug use: No   Sexual activity: Yes

## 2024-04-05 ENCOUNTER — Encounter: Payer: Self-pay | Admitting: Family Medicine

## 2024-04-05 ENCOUNTER — Ambulatory Visit (INDEPENDENT_AMBULATORY_CARE_PROVIDER_SITE_OTHER): Admitting: Family Medicine

## 2024-04-05 ENCOUNTER — Ambulatory Visit: Payer: Self-pay

## 2024-04-05 VITALS — BP 140/70 | HR 69 | Temp 97.6°F | Wt 208.4 lb

## 2024-04-05 DIAGNOSIS — I10 Essential (primary) hypertension: Secondary | ICD-10-CM

## 2024-04-05 DIAGNOSIS — R42 Dizziness and giddiness: Secondary | ICD-10-CM

## 2024-04-05 DIAGNOSIS — Z7984 Long term (current) use of oral hypoglycemic drugs: Secondary | ICD-10-CM | POA: Diagnosis not present

## 2024-04-05 DIAGNOSIS — E119 Type 2 diabetes mellitus without complications: Secondary | ICD-10-CM | POA: Diagnosis not present

## 2024-04-05 NOTE — Patient Instructions (Signed)
 May take one extra Glipizide  for fasting blood sugar > 200.  May take extra Bystolic  for BP > 170/100.

## 2024-04-05 NOTE — Telephone Encounter (Signed)
 FYI Only or Action Required?: FYI only for provider.  Patient was last seen in primary care on 03/08/2024 by Johnny Garnette LABOR, MD.  Called Nurse Triage reporting Dizziness.  Symptoms began several days ago.  Interventions attempted: Prescription medications: farxiga .  Symptoms are: unchanged.  Triage Disposition: See Physician Within 24 Hours  Patient/caregiver understands and will follow disposition?:    Copied from CRM #[id number]. Topic: Clinical - Red Word Triage >> Apr 05, 2024 11:26 AM Drema MATSU wrote: Red Word that prompted transfer to Nurse Triage: Patient blood sugar and blood pressure has been high since his last cortisol shot. His blood sugar has been ranging anywhere from 180-400 and the last blood pressure reading checked was 176/100. >> Apr 05, 2024 11:33 AM Drema MATSU wrote: Symptoms: headaches, chest pain, and dizziness Reason for Disposition  [1] MODERATE dizziness (e.g., interferes with normal activities) AND [2] has NOT been evaluated by doctor (or NP/PA) for this  (Exception: Dizziness caused by heat exposure, sudden standing, or poor fluid intake.)  Answer Assessment - Initial Assessment Questions Pt wife is calling in to discuss sxs. States his BGL has been ranging from 100-400 and they've been managing what they can at home. States he has had the elevated blood sugars as well as intermittent dizziness since Thursday. Pt received Cortisol shot on Wednesday. NAD currently pt is acting normally and denies current CP. RN educated on importance of prompt evaluation of sxs. Wife agreeable to booking appointment this afternoon.   1. DESCRIPTION: Describe your dizziness.     lightheaded 2. LIGHTHEADED: Do you feel lightheaded? (e.g., somewhat faint, woozy, weak upon standing)     worse 3. VERTIGO: Do you feel like either you or the room is spinning or tilting? (i.e., vertigo)     denies 4. SEVERITY: How bad is it?  Do you feel like you are going to faint? Can you  stand and walk?     Able to ambulate; but admits to lightheadness 5. ONSET:  When did the dizziness begin?     Thursday 6. AGGRAVATING FACTORS: Does anything make it worse? (e.g., standing, change in head position)     denies 7. HEART RATE: Can you tell me your heart rate? How many beats in 15 seconds?  (Note: Not all patients can do this.)       denies 8. CAUSE: What do you think is causing the dizziness? (e.g., decreased fluids or food, diarrhea, emotional distress, heat exposure, new medicine, sudden standing, vomiting; unknown)     Potential side effect of cortisol shot last wednesday 9. RECURRENT SYMPTOM: Have you had dizziness before? If Yes, ask: When was the last time? What happened that time?     denies 10. OTHER SYMPTOMS: Do you have any other symptoms? (e.g., fever, chest pain, vomiting, diarrhea, bleeding)       100-400BGL at home testing; intermittent CP  Protocols used: Dizziness - Lightheadedness-A-AH

## 2024-04-05 NOTE — Progress Notes (Unsigned)
 Established Patient Office Visit  Subjective   Patient ID: [name], male    DOB: 09-17-1950  Age: 73 y.o. MRN: [id number]  Chief Complaint  Patient presents with   Dizziness    HPI  {History (Optional):23778} [name] is seen today as a work in with some recent nonspecific dizziness and elevated blood pressure reading of 176/100 on Monday.  Recent history is that he was seen with some left hip pain.  Was referred to orthopedics.  X-ray showed severe degenerative changes with almost bone against bone arthritis.  He received steroid injection which he states was extremely painful.  He feels like his hip has been worse since the injection.  He has type 2 diabetes and blood sugar shot up very high with 1 home glucose of 398.  He currently takes Farxiga , Actos , and glipizide .  His last A1c was 6.9% last spring.  Blood sugars have been improving some past couple days.  He is steroid injection was on 9-24.  He had fasting sugar earlier today lower 100 range.  He was mostly concerned about his blood pressure.  He does take Bystolic  2.5 mg daily, lisinopril  40 mg daily, and amlodipine  5 mg daily.  Blood pressure has improved somewhat over the past couple days.  He apparently had some recent mild burning with urination.  On Monday started taking some Septra  DS which he had taken previously.  Denies any major burning at this time.  No fevers or chills.  No vertigo.  No focal weakness.  Not describing any orthostatic changes  Past Medical History:  Diagnosis Date   CAD (coronary artery disease)    nuclear stress test-09/09/11 low risk scan EF61%, sees Dr. Burnard    Chronic headache    Chronic neck pain    Diabetes mellitus    sees Dr. Sam   Hyperlipidemia    Hypertension 12/26/2008   ECHO- EF>55%   Insomnia    Past Surgical History:  Procedure Laterality Date   APPENDECTOMY     cervical spine injection     COLONOSCOPY  04-02-06    per [facility], clear, repeat in 10 yrs    CORONARY ARTERY BYPASS GRAFT  2002   4 vessels   CORONARY PRESSURE/FFR STUDY N/A 01/12/2022   Procedure: INTRAVASCULAR PRESSURE WIRE/FFR STUDY;  Surgeon: Court Dorn PARAS, MD;  Location: [facility];  Service: Cardiovascular;  Laterality: N/A;   CORONARY STENT INTERVENTION N/A 01/12/2022   Procedure: CORONARY STENT INTERVENTION;  Surgeon: Court Dorn PARAS, MD;  Location: [facility];  Service: Cardiovascular;  Laterality: N/A;   LEFT HEART CATH AND CORS/GRAFTS ANGIOGRAPHY N/A 01/12/2022   Procedure: LEFT HEART CATH AND CORS/GRAFTS ANGIOGRAPHY;  Surgeon: Court Dorn PARAS, MD;  Location: [facility];  Service: Cardiovascular;  Laterality: N/A;    reports that he quit smoking about 53 years ago. His smoking use included cigarettes. He started smoking about 63 years ago. He has a 2.5 pack-year smoking history. He has never used smokeless tobacco. He reports that he does not drink alcohol and does not use drugs. family history includes Cancer in his brother, brother, and brother; Coronary artery disease in an other family member; Heart disease in his mother; Hypertension in an other family member; Liver cancer in his father. Allergies  Allergen Reactions   Codeine Anaphylaxis    Pt. verbalized that he has taken and can take Hydrocodone , Oxycodone  w/o issue - codeine allergy only   Ambien [Zolpidem Tartrate] Other (  See Comments)    Headache    Cipro [Ciprofloxacin Hcl] Hives and Rash   Crestor  [Rosuvastatin ] Other (See Comments)    Myalgia    Isordil  [Isosorbide  Nitrate] Other (See Comments)    Headaches   Lipitor [Atorvastatin] Other (See Comments)    Myalgia    Victoza  [Liraglutide ] Rash   Zetia  [Ezetimibe ] Other (See Comments)    Myalgia     Review of Systems  Constitutional:  Negative for chills and fever.  Eyes:  Negative for blurred vision.  Respiratory:  Negative for shortness of breath.   Cardiovascular:  Negative for chest pain.  Musculoskeletal:  Positive  for joint pain.  Neurological:  Positive for dizziness. Negative for speech change, focal weakness, seizures, loss of consciousness, weakness and headaches.      Objective:     BP (!) 140/70 (BP Location: Left Arm, Cuff Size: Normal)   Pulse 69   Temp 97.6 F (36.4 C) (Oral)   Wt 208 lb 6.4 oz (94.5 kg)   SpO2 94%   BMI 30.78 kg/m  BP Readings from Last 3 Encounters:  04/05/24 (!) 140/70  03/08/24 120/60  01/24/24 128/60   Wt Readings from Last 3 Encounters:  04/05/24 208 lb 6.4 oz (94.5 kg)  03/08/24 216 lb (98 kg)  01/24/24 217 lb 9.6 oz (98.7 kg)      Physical Exam Vitals reviewed.  Constitutional:      General: He is not in acute distress.    Appearance: He is well-developed.  HENT:     Right Ear: External ear normal.     Left Ear: External ear normal.  Eyes:     Pupils: Pupils are equal, round, and reactive to light.  Neck:     Thyroid : No thyromegaly.     Comments: No carotid bruits Cardiovascular:     Rate and Rhythm: Normal rate and regular rhythm.  Pulmonary:     Effort: Pulmonary effort is normal. No respiratory distress.     Breath sounds: Normal breath sounds. No wheezing or rales.  Musculoskeletal:     Cervical back: Neck supple.     Right lower leg: No edema.     Left lower leg: No edema.  Neurological:     General: No focal deficit present.     Mental Status: He is alert and oriented to person, place, and time.     Motor: No weakness.     Gait: Gait normal.      No results found for any visits on 04/05/24.  {Labs (Optional):23779}  The ASCVD Risk score (Arnett DK, et al., 2019) failed to calculate for the following reasons:   The valid total cholesterol range is 130 to 320 mg/dL    Assessment & Plan:   #1 recent nonspecific dizziness and lightheadedness with elevated blood pressure 176/100 by home reading Monday.  He thinks dizziness may have been related to elevated blood pressure.  He also had substantially elevated glucose  recently of 398 following steroid injection left hip.  Blood pressure and blood sugars have been steadily improving since then.  Dizziness essentially resolved at this time.  #2 hypertension history.  Up slightly today.  Possibly exacerbated by recent steroids.  Recommend close home monitoring.  Recommend he bring his cuff in to compare with ours next visit with his primary provider.  Reiterated the portance of low-sodium diet less than 2500 mg daily.  #3 type 2 diabetes with recent hyperglycemia exacerbation with steroid injection.  Blood sugars improving.  He has follow-up scheduled endocrinologist in October.  We did mention that he could potentially double up his glipizide  to 1 twice daily for a few days if he is having any fasting sugars over 200 or random blood sugars over 250 Blood sugar should gradually continue to improve over the next couple weeks as steroids get out of his system  Wolm Scarlet, MD

## 2024-05-01 ENCOUNTER — Ambulatory Visit: Admitting: Cardiology

## 2024-05-02 ENCOUNTER — Ambulatory Visit: Admitting: Internal Medicine

## 2024-05-02 ENCOUNTER — Encounter: Payer: Self-pay | Admitting: Internal Medicine

## 2024-05-02 VITALS — BP 136/72 | HR 62 | Ht 69.0 in | Wt 213.0 lb

## 2024-05-02 DIAGNOSIS — E1159 Type 2 diabetes mellitus with other circulatory complications: Secondary | ICD-10-CM

## 2024-05-02 DIAGNOSIS — Z7984 Long term (current) use of oral hypoglycemic drugs: Secondary | ICD-10-CM | POA: Diagnosis not present

## 2024-05-02 LAB — POCT GLYCOSYLATED HEMOGLOBIN (HGB A1C): Hemoglobin A1C: 7.1 % — AB (ref 4.0–5.6)

## 2024-05-02 LAB — POCT GLUCOSE (DEVICE FOR HOME USE): POC Glucose: 189 mg/dL — AB (ref 70–99)

## 2024-05-02 MED ORDER — PIOGLITAZONE HCL 45 MG PO TABS: 45.0000 mg | ORAL_TABLET | Freq: Every day | ORAL | 3 refills | Status: AC

## 2024-05-02 MED ORDER — GLIPIZIDE 5 MG PO TABS: 5.0000 mg | ORAL_TABLET | Freq: Every day | ORAL | 3 refills | Status: AC

## 2024-05-02 MED ORDER — DAPAGLIFLOZIN PROPANEDIOL 10 MG PO TABS: 10.0000 mg | ORAL_TABLET | Freq: Every day | ORAL | 3 refills | Status: AC

## 2024-05-02 NOTE — Patient Instructions (Signed)
-   Continue  Glipizide  5 mg, 1 tablet before Supper  - Continue Actos  45 mg, 1 tablet  daily  - Continue Farxiga  10 mg once a day     HOW TO TREAT LOW BLOOD SUGARS (Blood sugar LESS THAN 70 MG/DL) Please follow the RULE OF 15 for the treatment of hypoglycemia treatment (when your (blood sugars are less than 70 mg/dL)   STEP 1: Take 15 grams of carbohydrates when your blood sugar is low, which includes:  3-4 GLUCOSE TABS  OR 3-4 OZ OF JUICE OR REGULAR SODA OR ONE TUBE OF GLUCOSE GEL    STEP 2: RECHECK blood sugar in 15 MINUTES STEP 3: If your blood sugar is still low at the 15 minute recheck --> then, go back to STEP 1 and treat AGAIN with another 15 grams of carbohydrates.

## 2024-05-02 NOTE — Progress Notes (Signed)
 Name: [name]   Age/ Sex: 73 y.o. male   MRN/ DOB: [id number]/ [birth year]     PCP: Johnny Garnette LABOR, MD   Reason for Endocrinology Evaluation: Type 2 Diabetes Mellitus  Initial Endocrine Consultative Visit: 08/31/2018    PATIENT IDENTIFIER: [name] is a 74 y.o. male with a past medical history of T2DM,CAD (S/P CABG). The patient has followed with Endocrinology clinic since 08/31/2018 for consultative assistance with management of his diabetes.  DIABETIC HISTORY:  Mr. Roudebush was diagnosed with T2DM in 2010, he has been on Metformin  and Actos  for many years, he is intolerant to Victoza  due to rash. Januvia  has been ineffective , which he stopped 08/2018. He has never been on insulin  . His hemoglobin A1c has ranged from 6.1% in 2017, peaking at 10.4% in 2019  On his initial presentation his A1c was 8.5% . He was on Metformin  and Actos  . We added Farxiga    Glipizide  started 04/2020 Self stopped Metformin  due to diarrhea 09/2020  SUBJECTIVE:   During the last visit (11/01/2023): A1c 6.9%.      Today (05/02/2024): [name] is here for a  follow up visit on his diabetes.  He checks his sugar occasionally. He did not bring his meter today.   He continues to follow-up with cardiology for HTN, dyslipidemia , CAD, s/p PCI 01/2022 for unstable angina  The patient received left hip triamcinolone intra-articular injection in September, 2025 which resulted in hyperglycemia  He is on oral prednisone  intermittently  for ~ 2.5 months  Continues with left hip pain  No heartburn  No nausea or vomiting  No constipation or diarrhea    HOME DIABETES REGIMEN:  Actos  45 mg daily  Farxiga  10 mg daily Glipizide  5 mg ,  1 tab before Supper    GLUCOSE LOG : Did not bring     DIABETIC COMPLICATIONS: Microvascular complications:  Neuropathy  Denies: ckd , retinopathy  Last eye exam: Completed 09/28/2023    Macrovascular complications:  CAD ( S/p CABG) , S/P  PCI 01/2022 Denies: PVD, CVA      HISTORY:  Past Medical History:  Past Medical History:  Diagnosis Date   CAD (coronary artery disease)    nuclear stress test-09/09/11 low risk scan EF61%, sees Dr. Burnard    Chronic headache    Chronic neck pain    Diabetes mellitus    sees Dr. Sam   Hyperlipidemia    Hypertension 12/26/2008   ECHO- EF>55%   Insomnia    Past Surgical History:  Past Surgical History:  Procedure Laterality Date   APPENDECTOMY     cervical spine injection     COLONOSCOPY  04-02-06    per  GI, clear, repeat in 10 yrs   CORONARY ARTERY BYPASS GRAFT  2002   4 vessels   CORONARY PRESSURE/FFR STUDY N/A 01/12/2022   Procedure: INTRAVASCULAR PRESSURE WIRE/FFR STUDY;  Surgeon: Court Dorn PARAS, MD;  Location: [facility];  Service: Cardiovascular;  Laterality: N/A;   CORONARY STENT INTERVENTION N/A 01/12/2022   Procedure: CORONARY STENT INTERVENTION;  Surgeon: Court Dorn PARAS, MD;  Location: [facility];  Service: Cardiovascular;  Laterality: N/A;   LEFT HEART CATH AND CORS/GRAFTS ANGIOGRAPHY N/A 01/12/2022   Procedure: LEFT HEART CATH AND CORS/GRAFTS ANGIOGRAPHY;  Surgeon: Court Dorn PARAS, MD;  Location: [facility];  Service: Cardiovascular;  Laterality: N/A;   Social History:  reports that he quit smoking about 53 years  ago. His smoking use included cigarettes. He started smoking about 63 years ago. He has a 2.5 pack-year smoking history. He has never used smokeless tobacco. He reports that he does not drink alcohol and does not use drugs. Family History:  Family History  Problem Relation Age of Onset   Heart disease Mother    Liver cancer Father    Cancer Brother    Cancer Brother    Cancer Brother    Coronary artery disease Other    Hypertension Other    Colon cancer Neg Hx    Stomach cancer Neg Hx    Esophageal cancer Neg Hx    Colon polyps Neg Hx    Rectal cancer Neg Hx      HOME MEDICATIONS: Allergies as of  05/02/2024       Reactions   Codeine Anaphylaxis   Pt. verbalized that he has taken and can take Hydrocodone , Oxycodone  w/o issue - codeine allergy only   Ambien [zolpidem Tartrate] Other (See Comments)   Headache    Cipro [ciprofloxacin Hcl] Hives, Rash   Crestor  [rosuvastatin ] Other (See Comments)   Myalgia    Isordil  [isosorbide  Nitrate] Other (See Comments)   Headaches   Lipitor [atorvastatin] Other (See Comments)   Myalgia    Victoza  [liraglutide ] Rash   Zetia  [ezetimibe ] Other (See Comments)   Myalgia         Medication List        Accurate as of May 02, 2024  7:44 AM. If you have any questions, ask your nurse or doctor.          Accu-Chek Guide Test test strip Generic drug: glucose blood USE TO TEST BLOOD SUGAR EVERY DAY   amLODipine  5 MG tablet Commonly known as: NORVASC  TAKE 1 AND 1/2 TABLETS DAILY BY MOUTH   aspirin  81 MG tablet Take 81 mg by mouth daily.   clonazePAM  0.5 MG tablet Commonly known as: KLONOPIN  Take 1 tablet (0.5 mg total) by mouth 2 (two) times daily as needed for anxiety.   clopidogrel  75 MG tablet Commonly known as: PLAVIX  TAKE 1 TABLET BY MOUTH EVERY DAY WITH BREAKFAST   CoQ10 100 MG Caps Take 100 mg by mouth daily.   dapagliflozin  propanediol 10 MG Tabs tablet Commonly known as: Farxiga  Take 1 tablet (10 mg total) by mouth daily.   ferrous sulfate  325 (65 FE) MG tablet TAKE 325 MG BY MOUTH 2 (TWO) TIMES DAILY.   gabapentin  300 MG capsule Commonly known as: NEURONTIN  TAKE 2 (600 MG) CAPSULES BY MOUTH TWICE DAILY   glipiZIDE  5 MG tablet Commonly known as: GLUCOTROL  Take 1 tablet (5 mg total) by mouth daily.   lisinopril  40 MG tablet Commonly known as: ZESTRIL  TAKE 1 TABLET BY MOUTH EVERY DAY   MELATONIN GUMMIES PO Take 2 each by mouth at bedtime.   meloxicam  7.5 MG tablet Commonly known as: MOBIC  Take 1 tablet (7.5 mg total) by mouth daily.   nebivolol  2.5 MG tablet Commonly known as: BYSTOLIC  TAKE 1  TABLET BY MOUTH EVERY DAY   nitroGLYCERIN  0.4 MG SL tablet Commonly known as: NITROSTAT  Place 1 tablet (0.4 mg total) under the tongue every 5 (five) minutes as needed for chest pain.   Omega-3 350 MG Cpdr Take 350 mg by mouth daily.   pantoprazole  40 MG tablet Commonly known as: PROTONIX  TAKE 1 TABLET BY MOUTH TWICE A DAY   pioglitazone  45 MG tablet Commonly known as: ACTOS  Take 1 tablet (45 mg total) by mouth  daily.   Repatha  SureClick 140 MG/ML Soaj Generic drug: Evolocumab  Inject 140 mg into the skin every 14 (fourteen) days.   sildenafil  100 MG tablet Commonly known as: Viagra  Take 1 tablet (100 mg total) by mouth daily as needed for erectile dysfunction.   sulfamethoxazole -trimethoprim  800-160 MG tablet Commonly known as: BACTRIM  DS Take 1 tablet by mouth 2 (two) times daily as needed (urine symptoms).   tamsulosin  0.4 MG Caps capsule Commonly known as: FLOMAX  TAKE 1 CAPSULE BY MOUTH EVERY DAY   traZODone  50 MG tablet Commonly known as: DESYREL  TAKE 1 TABLET BY MOUTH EVERYDAY AT BEDTIME   traZODone  100 MG tablet Commonly known as: DESYREL  TAKE 1 TABLET BY MOUTH EVERYDAY AT BEDTIME       PHYSICAL EXAM: VS: BP 136/72 (BP Location: Left Arm, Patient Position: Sitting, Cuff Size: Normal)   Pulse 62   Ht 5' 9 (1.753 m)   Wt 213 lb (96.6 kg)   SpO2 97%   BMI 31.45 kg/m    EXAM: General: Pt appears well and is in NAD  Lungs: Clear with good BS bilat   Heart: Auscultation: RRR   Extremities: BL LE: Trace pretibial edema normal   Mental Status: Judgment, insight: intact Mood and affect: no depression, anxiety, or agitation     DM Foot Exam 05/02/2024 The skin of the feet is intact without sores or ulcerations. The pedal pulses are 1+ on right and 1+ on left. The sensation is intact to a screening 5.07, 10 gram monofilament bilaterally   DATA REVIEWED:  Lab Results  Component Value Date   HGBA1C 7.1 (A) 05/02/2024   HGBA1C 6.9 (A) 11/01/2023    HGBA1C 7.2 (A) 04/20/2023    Latest Reference Range & Units 08/30/23 14:53  Sodium 135 - 145 mEq/L 142  Potassium 3.5 - 5.1 mEq/L 4.0  Chloride 96 - 112 mEq/L 104  CO2 19 - 32 mEq/L 27  Glucose 70 - 99 mg/dL 857 (H)  BUN 6 - 23 mg/dL 18  Creatinine 9.59 - 8.49 mg/dL 8.98  Calcium  8.4 - 10.5 mg/dL 9.6  Alkaline Phosphatase 39 - 117 U/L 69  Albumin 3.5 - 5.2 g/dL 4.5  AST 0 - 37 U/L 22  ALT 0 - 53 U/L 22  Total Protein 6.0 - 8.3 g/dL 7.6  Total Bilirubin 0.2 - 1.2 mg/dL 0.3  GFR >39.99 mL/min 73.58    Latest Reference Range & Units 11/01/23 08:29  Microalb, Ur mg/dL 0.2  MICROALB/CREAT RATIO <30 mg/g creat 3  Creatinine, Urine 20 - 320 mg/dL 73    In office BG 810 mg/dL   ASSESSMENT / PLAN / RECOMMENDATIONS:   1) Type 2 Diabetes Mellitus, Optimally Controlled , With neuropathic and macrovascular complications - Most recent A1c of 7.1 %. Goal A1c < 7.0 %.     -A1c has slightly trended up, this is due to glucocorticoid intake -I did advise the patient to follow-up with orthopedics rather than taking oral glucocorticoids even intermittently due to long-term side effects of glucocorticoid therapy - He has not been able to obtain freestyle libre - I will not make any changes at this time, I am hoping discontinuing glucose codes would improve his A1c   MEDICATIONS: Take glipizide  5 mg , 1 tablet before supper Continue Farxiga  10 mg daily Continue Actos  45 mg daily   EDUCATION / INSTRUCTIONS: BG monitoring instructions: Patient is instructed to check his blood sugars 1 times a day, fasting . Call [facility] if: BG persistently <  70  I reviewed the Rule of 15 for the treatment of hypoglycemia in detail with the patient. Literature supplied.    2) Diabetic complications:  Eye: Does not have known diabetic retinopathy.  Neuro/ Feet: Does have known diabetic peripheral neuropathy. Renal: Patient does not have known baseline CKD. He is on an ACEI/ARB at  present.    F/U in 6 months      Signed electronically by: Stefano Redgie Butts, MD  [facility]  [facility] [address] [place] [place], [place] [address] Phone: [phone] FAX: [phone]   CC: Johnny Garnette LABOR, MD [address] [place] [address] Phone: [phone]  Fax: [phone]  Return to Endocrinology clinic as below: Future Appointments  Date Time Provider Department Center  05/09/2024  9:20 AM Anner Alm ORN, MD [facility]  06/20/2024  4:20 PM Luke Chiquita SAUNDERS, DO [facility] [address]

## 2024-05-08 ENCOUNTER — Encounter: Payer: Self-pay | Admitting: Radiology

## 2024-05-09 ENCOUNTER — Ambulatory Visit: Attending: Cardiology | Admitting: Cardiology

## 2024-05-09 VITALS — BP 112/58 | HR 67 | Ht 69.5 in | Wt 212.0 lb

## 2024-05-09 DIAGNOSIS — E7841 Elevated Lipoprotein(a): Secondary | ICD-10-CM

## 2024-05-09 DIAGNOSIS — Z955 Presence of coronary angioplasty implant and graft: Secondary | ICD-10-CM

## 2024-05-09 DIAGNOSIS — D649 Anemia, unspecified: Secondary | ICD-10-CM

## 2024-05-09 DIAGNOSIS — E119 Type 2 diabetes mellitus without complications: Secondary | ICD-10-CM

## 2024-05-09 DIAGNOSIS — E785 Hyperlipidemia, unspecified: Secondary | ICD-10-CM | POA: Diagnosis not present

## 2024-05-09 DIAGNOSIS — G720 Drug-induced myopathy: Secondary | ICD-10-CM

## 2024-05-09 DIAGNOSIS — I2510 Atherosclerotic heart disease of native coronary artery without angina pectoris: Secondary | ICD-10-CM

## 2024-05-09 DIAGNOSIS — I10 Essential (primary) hypertension: Secondary | ICD-10-CM | POA: Diagnosis not present

## 2024-05-09 DIAGNOSIS — T466X5D Adverse effect of antihyperlipidemic and antiarteriosclerotic drugs, subsequent encounter: Secondary | ICD-10-CM

## 2024-05-09 DIAGNOSIS — M25552 Pain in left hip: Secondary | ICD-10-CM

## 2024-05-09 NOTE — Patient Instructions (Signed)
 Medication Instructions:  No changes   *If you need a refill on your cardiac medications before your next appointment, please call your pharmacy*   Lab Work:fasting Lipid  If you have labs (blood work) drawn today and your tests are completely normal, you will receive your results only by: MyChart Message (if you have MyChart) OR A paper copy in the mail If you have any lab test that is abnormal or we need to change your treatment, we will call you to review the results.   Testing/Procedures:  Not needed  Follow-Up: At [facility], you and your health needs are our priority.  As part of our continuing mission to provide you with exceptional heart care, we have created designated Provider Care Teams.  These Care Teams include your primary Cardiologist (physician) and Advanced Practice Providers (APPs -  Physician Assistants and Nurse Practitioners) who all work together to provide you with the care you need, when you need it.     Your next appointment:   6 month(s)  The format for your next appointment:   In Person  Provider:   Alm Clay, MD

## 2024-05-09 NOTE — Progress Notes (Signed)
 Cardiology Office Note:  .   Date:  05/14/2024  ID:  [name], [name] Dec 17, 1950, MRN [id number] PCP: Johnny Garnette LABOR, MD  [facility] Providers Cardiologist:  Alm Clay, MD     No chief complaint on file.   Patient Profile: .     [name] is a pleasant  73 y.o. male with a PMH notable for CAD-CABG, HTN, HLD with statin intolerance, DM-2 who presents here for 51-month follow-up-to establish new cardiologist at the request of Johnny Garnette LABOR, MD.  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  Not felt to be severe by pulmonary medicine. HTN-amlodipine  7.5 mg daily, lisinopril  40 Miller daily and bisoprolol 2.5 mg daily (did not tolerate metoprolol) HLD (high LP(a)) with statin myopathy-on Repatha  DM-2 on Farxiga  and Actos  along with glipizide  RLS     [name] was last seen by Dr. Burnard on March 27 after an echocardiogram showed normal EF with basal inferior and inferoseptal hypokinesis consistent with the occluded RCA disease.  He denied any chest pain or pressure.  No exertional dyspnea.  Was scheduled for colonoscopy  Subjective  Discussed the use of AI scribe software for clinical note transcription with the patient, who gave verbal consent to proceed.  History of Present Illness [name] is a 73 year old male with coronary artery disease who presents for follow-up regarding his cardiac health.  He has a history of coronary artery disease, having undergone bypass surgery in June 2002 and stent placement in July 2023. An echocardiogram in March 2025 showed normal pump function but abnormal relaxation and inferior wall motion issues. He is on aspirin , Plavix , amlodipine  7.5 mg, lisinopril  40 mg, and Bystolic  2.5 mg for his coronary artery disease and blood pressure. No chest pain, heart racing, or swelling in his legs. 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  He has diabetes  managed with Actos  45 mg, glipizide  5 mg, and Farxiga  10 mg. His recent A1c was 7.1, slightly elevated due to a recent cortisone shot for hip pain.  He experiences significant hip pain, worsened after a cortisone shot, which limits his mobility.  He has a history of severe chest and arm pain, which was not cardiac-related but due to low blood levels. B12 shots for three months improved his condition, and he now takes a B12 tablet daily.  He has a history of diverticulosis and experienced excruciating stomach pain when eating, which improved significantly after starting a stomach medication. He continues to take this medication and reports minimal discomfort now.  He takes Repatha  for cholesterol management, with his last lipid panel checked in May of the previous year. He also takes CoQ10 100 mg, omega-3 350 mg, and a Jeriton tablet daily.  For sleep, he takes trazodone  100 mg, two Tylenol  PM, clonazepam  0.5 mg, and melatonin. He has a history of restless leg syndrome, which affects his sleep if not managed with medication.   Cardiovascular ROS: no chest pain or dyspnea on exertion positive for - musculoskeletal chest pain and shoulder pain as well as hip and back pain.  Limits his activity. negative for - edema, irregular heartbeat, orthopnea, palpitations, paroxysmal nocturnal dyspnea, rapid heart rate, shortness of breath, or lightheadedness, dizziness or wooziness, syncope/near syncope near syncope or TIA/amaurosis fugax, claudication.  Melena, hematochezia, hematuria or epistaxis  ROS:  Review of Systems - as noted above    Objective   Current Meds  Current CV Related Meds Sig   amLODipine  (NORVASC ) 5 MG tablet TAKE 1 AND 1/2 TABLETS DAILY BY MOUTH   aspirin  81 MG tablet Take 81 mg by mouth daily.   clopidogrel  (PLAVIX ) 75 MG tablet TAKE 1 TABLET BY MOUTH EVERY DAY WITH BREAKFAST   Coenzyme Q10 (COQ10) 100 MG CAPS Take 100 mg by mouth daily.    dapagliflozin  propanediol (FARXIGA )  10 MG TABS tablet Take 1 tablet (10 mg total) by mouth daily.   Evolocumab  (REPATHA  SURECLICK) 140 MG/ML SOAJ Inject 140 mg into the skin every 14 (fourteen) days.   ferrous sulfate  325 (65 FE) MG tablet TAKE 325 MG BY MOUTH 2 (TWO) TIMES DAILY.   glipiZIDE  (GLUCOTROL ) 5 MG tablet Take 1 tablet (5 mg total) by mouth daily.   lisinopril  (ZESTRIL ) 40 MG tablet TAKE 1 TABLET BY MOUTH EVERY DAY   nebivolol  (BYSTOLIC ) 2.5 MG tablet TAKE 1 TABLET BY MOUTH EVERY DAY   nitroGLYCERIN  (NITROSTAT ) 0.4 MG SL tablet Place 1 tablet (0.4 mg total) under the tongue every 5 (five) minutes as needed for chest pain.   Omega-3 350 MG CPDR Take 350 mg by mouth daily.   pantoprazole  (PROTONIX ) 40 MG tablet TAKE 1 TABLET BY MOUTH TWICE A DAY   pioglitazone  (ACTOS ) 45 MG tablet Take 1 tablet (45 mg total) by mouth daily.   sildenafil  (VIAGRA ) 100 MG tablet Take 1 tablet (100 mg total) by mouth daily as needed for erectile dysfunction.    Noncardiac Medications Sig   clonazePAM  (KLONOPIN ) 0.5 MG tablet Take 1 tablet (0.5 mg total) by mouth 2 (two) times daily as needed for anxiety.   gabapentin  (NEURONTIN ) 300 MG capsule TAKE 2 (600 MG) CAPSULES BY MOUTH TWICE DAILY   MELATONIN GUMMIES PO Take 2 each by mouth at bedtime.   meloxicam  (MOBIC ) 7.5 MG tablet Take 1 tablet (7.5 mg total) by mouth daily.   sulfamethoxazole -trimethoprim  (BACTRIM  DS) 800-160 MG tablet Take 1 tablet by mouth 2 (two) times daily as needed (urine symptoms).   tamsulosin  (FLOMAX ) 0.4 MG CAPS capsule TAKE 1 CAPSULE BY MOUTH EVERY DAY   traZODone  (DESYREL ) 100 MG tablet TAKE 1 TABLET BY MOUTH EVERYDAY AT BEDTIME    SH: Married to [name].  3 children and 8 grandchildren with 4 great grandchildren.  Currently retired.  No alcohol or tobacco.  Studies Reviewed: SABRA   EKG Interpretation Date/Time:  Tuesday May 09 2024 08:40:11 EST Ventricular Rate:  64 PR Interval:  178 QRS Duration:  102 QT Interval:  420 QTC Calculation: 433 R  Axis:   63  Text Interpretation: Normal sinus rhythm Possible Inferior infarct , age undetermined ST & T wave abnormality, consider anterolateral ischemia When compared with ECG of 30-Sep-2023 09:20, T wave abnormality, consider anterolateral ischemia NOW PRESENT Confirmed by Anner Lenis ([id number]) on 05/09/2024 9:13:30 AM    Lab Results  Component Value Date   CHOL 102 11/09/2022   HDL 46 11/09/2022   LDLCALC 39 11/09/2022   LDLDIRECT 128.0 10/29/2011   TRIG 89 11/09/2022   CHOLHDL 2.2 11/09/2022   Lab Results  Component Value Date   NA 142 08/30/2023   K 4.0 08/30/2023   CREATININE 1.01 08/30/2023   GFR 73.58 08/30/2023   GLUCOSE 142 (H) 08/30/2023   Lab Results  Component Value Date   WBC 5.8 08/30/2023   HGB 13.9 08/30/2023   HCT 42.0 08/30/2023   MCV 91.1 08/30/2023   PLT 190.0 08/30/2023   Lab Results  Component Value Date  HGBA1C 7.1 (A) 05/02/2024    Results   DIAGNOSTIC Echocardiogram: Normal LVEF of 55-60%.  Basal inferior and basal inferoseptal hypokinesis.  Mild concentric LVH with GR 2 DD, and severely dilated LA..  Mildly enlarged RV with normal RVP.  Normal RAP with mildly dilated RA.  Mild to moderate MR.  AoV sclerosis with no stenosis.  Small PFO with color-flow suggestion of left-to-right shunting.  (09/2023) => Echo from 01/31/2022 showed EF 66 5%.  No artery remained.  G1 DD.  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   Lexiscan  Myoview : EF 44 with small fixed defect in the basal inferior wall consistent with possible prior infarction.  No ischemia, but reduced EF compared to prior study (10/28/2021) LHC-Cors and Grafts-PCI:Right dominant. Prox LCx 40%, mid/AVG LCx 75% (DES PCI: Synergy XD 2.5 mm 12 mm - 2.6 mm), OM 3 50%.Distal RCA to PDA 100%.  Mid LAD 100%.  LIMA to D2-LAD patent SVG to D1 patent.  (01/12/2022)    Colonoscopy: Normal, no bleeding, no polyps.    Risk Assessment/Calculations:        Mr. Mcbain perioperative risk of a major  cardiac event is 0.4% according to the Revised Cardiac Risk Index (RCRI).  Therefore, he is at low risk for perioperative complications.   His functional capacity is good to excellent for age-limited more by MSK pain than any cardiac symptoms at 7.99 METs according to the Duke Activity Status Index (DASI). Recommendations: According to ACC/AHA guidelines, no further cardiovascular testing needed.  The patient may proceed to surgery at acceptable risk.   Antiplatelet and/or Anticoagulation Recommendations: Clopidogrel  (Plavix ) can be held for 5-7 days prior to his surgery and resumed as soon as possible post op. He will not be on aspirin  Not on DOAC        Physical Exam:   VS:  BP (!) 112/58 (BP Location: Right Arm, Patient Position: Sitting, Cuff Size: Large)   Pulse 67   Ht 5' 9.5 (1.765 m)   Wt 212 lb (96.2 kg)   SpO2 94%   BMI 30.86 kg/m    Wt Readings from Last 3 Encounters:  05/09/24 212 lb (96.2 kg)  05/02/24 213 lb (96.6 kg)  04/05/24 208 lb 6.4 oz (94.5 kg)      GEN: Well nourished, well groomed in no acute distress; mildly obese but otherwise healthy appearing. NECK: No JVD; No carotid bruits CARDIAC:  RRR, Normal S1, S2;1/6 SEM at RUSB but otherwise no murmurs, rubs, gallops RESPIRATORY:  Clear to auscultation without rales, wheezing or rhonchi ; nonlabored, good air movement. ABDOMEN: Soft, non-tender, non-distended EXTREMITIES:  No edema; No deformity      ASSESSMENT AND PLAN: .    Problem List Items Addressed This Visit       Cardiology Problems   Coronary artery disease involving native coronary artery without angina pectoris - Primary (Chronic)   Coronary Artery Disease (CAD) with history of Coronary Artery Bypass Grafting (CABG) and stent placement Echocardiogram showed normal EF (55 to 60%) with GR 2 DD and abnormal relaxation and inferior wall hypokinesis, indicating distal RCA disease.SABRA  He remains asymptomatic with no chest pain, pressure, or dyspnea.  Discussed longevity of arterial versus vein grafts.  For now, Surveillance stress testing unnecessary due to absence of symptoms (would be due for surveillance testing 2 years from now.. Post-eighty, testing guided by activity level and symptoms. - Discontinue aspirin  due to increased bleeding risk. - Continue clopidogrel  75 mg daily.  - Okay to hold Plavix  5  to 7 days preop for surgeries or procedures. - Continue current dose of beta-blocker (Bystolic  2.5 mg daily), ACE inhibitor (lisinopril  40 mg daily), and calcium  channel blocker (amlodipine  10.5 mg daily) - Continue Repatha . - Check cholesterol levels today. - Schedule follow-up in six months to reassess condition and discuss potential surveillance stress testing in 2028.  With potential hip surgery in the near future, as long as he remains asymptomatic without any chest pain or pressure or dyspnea at rest or exertion, no heart failure symptoms of PND, orthopnea or edema, or any signs or symptoms of arrhythmias, there would be no indication for any further testing prior to surgery.  He is easily able to achieve greater than 4 if not close to 8 METS => CV Risk Assessment/Calculations section.      Essential hypertension (Chronic)   Blood pressure management supports heart function and prevents CAD-related complications. No peripheral edema or dyspnea. - Continue current antihypertensive regimen: After multiple different medication adjustments he is now stabilized back on Bystolic  2.5 mg daily along with amlodipine  7.5 mg daily and lisinopril  40 mg daily.      Relevant Orders   EKG 12-Lead (Completed)   Lipid panel (Completed)   Hyperlipidemia with target low density lipoprotein (LDL) cholesterol less than 55 mg/dL (Chronic)   Has been unable to take statins or other oral medications.  Currently on Repatha  plus omega-3 fatty acids and co-Q10. Last lipid panel was in May of the previous year; due for a check to ensure treatment efficacy. -  Check lipid panel today.      Relevant Orders   Lipid panel (Completed)     Other   Chronic anemia (Chronic)   Severe anemia improved with B12 injections; now maintained on oral B12 supplementation. Reports significant symptom improvement. - Continue B12 supplementation.      Pain in left hip   Severe hip pain, worsened post-cortisone injection. Considering hip replacement surgery; currently limited in activity. Informed to hold clopidogrel  five days prior to surgery. - Consider hip replacement surgery if pain persists. - Okay to hold clopidogrel  for five days prior to surgery if decided.      Presence of drug coated stent in left circumflex coronary artery (Chronic)   He is now greater than 2 years out from PCI to the LCx.  No further angina.   At this point I think it is safe to go and stop aspirin  and continue Plavix  75 mg monotherapy long-term based on these extensive disease. - Okay to hold Plavix  75 mg 5 to 7 days preop for surgeries or procedures. - Okay to hold Plavix  3 to 7 days for any significant bleeding or bruising especially any GI bleed.      Statin myopathy (Chronic)   Over the years, he has become more more intolerant of statins to the point where he is unable to tolerate statins or other oral medications.  Tolerating Repatha .      Type 2 diabetes mellitus without complication, without long-term current use of insulin  (HCC) (Chronic)   Recent A1c was 7.1, slightly elevated, potentially due to recent cortisone injection. Under endocrinologist care. - Continue current diabetes medications, however I would consider switching from pioglitazone  to a different medication given his tendency to cardiomyopathy..               Follow-Up: Return in about 6 months (around 11/06/2024) for 6 month follow-up with me, [employer].  I spent 64 minutes in the care of [name] today including reviewing labs (1 minute), reviewing outside labs from  [facility] and [facility] data (1 minute), reviewing studies (I personally reviewed cath films dating back to his pre-CABG and 2 post CABG films.  I also reviewed the CABG report, echocardiogram and Myoview  reports from the last 10 years: 15 minutes), face to face time discussing treatment options (21 minutes), reviewing records from last clinic note from Dr. Burnard and colonoscopy reports (6 minutes), 5-minute for preop risk assessment, 15 minutes dictating, updating history, and documenting in the encounter.  Complex medical history requires regular monitoring to manage chronic conditions. Discussed need for regular lipid and chemistry checks. - Schedule follow-up appointment in six months. - Perform blood work today including lipid panel and chemistries.   ADDENDUM: Labs from 05/09/2024: TC 144, TG 125, HDL 50, LDL 72 => acceptable given that he is on PCSK9 inhibitor and not tolerant of other medications.     Signed, Alm MICAEL Clay, MD, MS Alm Clay, M.D., M.S. Interventional Cardiologist  [facility] Pager # [phone]

## 2024-05-10 LAB — LIPID PANEL
Chol/HDL Ratio: 2.9 ratio (ref 0.0–5.0)
Cholesterol, Total: 144 mg/dL (ref 100–199)
HDL: 50 mg/dL (ref >39)
LDL Chol Calc (NIH): 72 mg/dL (ref 0–99)
Triglycerides: 125 mg/dL (ref 0–149)
VLDL Cholesterol Cal: 22 mg/dL (ref 5–40)

## 2024-05-13 NOTE — Progress Notes (Incomplete)
 Cardiology Office Note:  .   Date:  05/09/2024  ID:  [name], [name] September 15, 1950, MRN [id number] PCP: Johnny Garnette LABOR, MD  [facility] Providers Cardiologist:  None { Click to update primary MD,subspecialty MD or APP then REFRESH:1}    No chief complaint on file.   Patient Profile: .     [name] is a pleasant  73 y.o. male with a PMH notable for CAD-CABG, HTN, HLD with statin intolerance, DM-2 who presents here for 5-month follow-up-to establish new cardiologist at the request of Johnny Garnette LABOR, MD.  CAD-CABG (12/2020) -Dr. Kerrin.   Seq LIMA-D2-LAD, Seq LRad-RI1-RI2) .  Jul 2023: DES PCI mid/AVG LCx; patent grafts, rPDA CTO & RPL CTO (fills via collaterals)     [name] was last seen on ***  Subjective  Discussed the use of AI scribe software for clinical note transcription with the patient, who gave verbal consent to proceed.  History of Present Illness [name] is a 73 year old male with coronary artery disease who presents for follow-up regarding his cardiac health.  He has a history of coronary artery disease, having undergone bypass surgery in June 2002 and stent placement in July 2023. An echocardiogram in March 2025 showed normal pump function but abnormal relaxation and inferior wall motion issues. He is on aspirin , Plavix , amlodipine  7.5 mg, lisinopril  40 mg, and Bystolic  2.5 mg for his coronary artery disease and blood pressure. No chest pain, heart racing, or swelling in his legs. 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  He has diabetes managed with Actos  45 mg, glipizide  5 mg, and Farxiga  10 mg. His recent A1c was 7.1, slightly elevated due to a recent cortisone shot for hip pain.  He experiences significant hip pain, worsened after a cortisone shot, which limits his mobility.  He has a history of severe chest and arm pain, which was not cardiac-related but due to low blood levels. B12 shots for three months  improved his condition, and he now takes a B12 tablet daily.  He has a history of diverticulosis and experienced excruciating stomach pain when eating, which improved significantly after starting a stomach medication. He continues to take this medication and reports minimal discomfort now.  He takes Repatha  for cholesterol management, with his last lipid panel checked in May of the previous year. He also takes CoQ10 100 mg, omega-3 350 mg, and a Jeriton tablet daily.  For sleep, he takes trazodone  100 mg, two Tylenol  PM, clonazepam  0.5 mg, and melatonin. He has a history of restless leg syndrome, which affects his sleep if not managed with medication.     Cardiovascular ROS: {roscv:310661}  ROS:  Review of Systems - {ros master:310782}    Objective    Studies Reviewed: SABRA        Lab Results  Component Value Date   CHOL 102 11/09/2022   HDL 46 11/09/2022   LDLCALC 39 11/09/2022   LDLDIRECT 128.0 10/29/2011   TRIG 89 11/09/2022   CHOLHDL 2.2 11/09/2022   Lab Results  Component Value Date   NA 142 08/30/2023   K 4.0 08/30/2023   CREATININE 1.01 08/30/2023   GFR 73.58 08/30/2023   GLUCOSE 142 (H) 08/30/2023   Lab Results  Component Value Date   WBC 5.8 08/30/2023   HGB 13.9 08/30/2023   HCT 42.0 08/30/2023   MCV 91.1 08/30/2023   PLT 190.0 08/30/2023   Lab Results  Component Value Date  HGBA1C 7.1 (A) 05/02/2024    Results LABS A1c: 7.1 A1c: 6.8  DIAGNOSTIC Echocardiogram: Normal LVEF of 55-60%.  Basal inferior and basal inferoseptal hypokinesis.  Mild concentric LVH with GR 2 DD, and severely dilated LA..  Mildly enlarged RV with normal RVP.  Normal RAP with mildly dilated RA.  Mild to moderate MR.  AoV sclerosis with no stenosis.  Small PFO with color-flow suggestion of left-to-right shunting.  (09/2023) => Echo from 01/31/2022 showed EF 66 5%.  No artery remained.  G1 DD.  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   Lexiscan  Myoview : EF 44 with small fixed  defect in the basal inferior wall consistent with possible prior infarction.  No ischemia, but reduced EF compared to prior study (10/28/2021) LHC-Cors and Grafts-PCI:Right dominant. Prox LCx 40%, mid/AVG LCx 75% (DES PCI: Synergy XD 2.5 mm 12 mm - 2.6 mm), OM 3 50%.Distal RCA to PDA 100%.  Mid LAD 100%.  LIMA to D2-LAD patent SVG to D1 patent.  (01/12/2022)    Colonoscopy: Normal, no bleeding, no polyps.    Risk Assessment/Calculations:               Physical Exam:   VS:  BP (!) 112/58 (BP Location: Right Arm, Patient Position: Sitting, Cuff Size: Large)   Pulse 67   Ht 5' 9.5 (1.765 m)   Wt 212 lb (96.2 kg)   SpO2 94%   BMI 30.86 kg/m    Wt Readings from Last 3 Encounters:  05/09/24 212 lb (96.2 kg)  05/02/24 213 lb (96.6 kg)  04/05/24 208 lb 6.4 oz (94.5 kg)    Physical Exam MUSCULOSKELETAL: Soft murmur present. No clubbing, cyanosis, or edema. Normal pedal pulses. PULMONARY: Lungs are clear to auscultation bilaterally. Nonlabored breathing. Good air movement.   GEN: Well nourished, well developed in no acute distress; *** NECK: No JVD; No carotid bruits CARDIAC: Normal S1, S2; RRR, no murmurs, rubs, gallops RESPIRATORY:  Clear to auscultation without rales, wheezing or rhonchi ; nonlabored, good air movement. ABDOMEN: Soft, non-tender, non-distended EXTREMITIES:  No edema; No deformity      ASSESSMENT AND PLAN: .    Problem List Items Addressed This Visit       Cardiology Problems   Essential hypertension - Primary   Relevant Orders   EKG 12-Lead (Completed)    Assessment and Plan Assessment & Plan Coronary Artery Disease (CAD) with history of Coronary Artery Bypass Grafting (CABG) and stent placement Echocardiogram showed normal pump function with abnormal relaxation and inferior wall hypokinesis, indicating blocked right coronary artery with collateral flow. Pump function is 55-60%, low normal. Asymptomatic with no chest pain, pressure, or dyspnea. Discussed  longevity of arterial versus vein grafts. Surveillance stress testing unnecessary due to absence of symptoms. Post-eighty, testing guided by activity level and symptoms. - Discontinue aspirin  due to increased bleeding risk. - Continue clopidogrel . - Check cholesterol levels today. - Schedule follow-up in six months to reassess condition and discuss potential surveillance stress testing in 2028.  Hypertension Blood pressure management supports heart function and prevents CAD-related complications. No peripheral edema or dyspnea. - Continue current antihypertensive regimen.  Hyperlipidemia On evolocumab  for cholesterol management. Last lipid panel was in May of the previous year; due for a check to ensure treatment efficacy. - Check lipid panel today.  Diabetes Mellitus Recent A1c was 7.1, slightly elevated, potentially due to recent cortisone injection. Under endocrinologist care. - Continue current diabetes medications.  Hip Pain with consideration for Hip Replacement Severe hip pain, worsened post-cortisone  injection. Considering hip replacement surgery; currently limited in activity. Informed to hold clopidogrel  five days prior to surgery. - Consider hip replacement surgery if pain persists. - Hold clopidogrel  for five days prior to surgery if decided.  Anemia with B12 deficiency Severe anemia improved with B12 injections; now maintained on oral B12 supplementation. Reports significant symptom improvement. - Continue B12 supplementation.  Gastrointestinal issues with diverticulosis and possible ulcer Diagnosed with diverticulosis; prescribed stomach medication resolved 99.5% of pain, suggesting a healed ulcer. No bleeding or polyps on colonoscopy. - Continue stomach medication as prescribed.  Restless Leg Syndrome Managed with trazodone , clonazepam , and melatonin. Reports significant sleep disturbance without these medications. - Continue trazodone , clonazepam , and melatonin for  sleep.  Follow-up Complex medical history requires regular monitoring to manage chronic conditions. Discussed need for regular lipid and chemistry checks. - Schedule follow-up appointment in six months. - Perform blood work today including lipid panel and chemistries.  Recording duration: 20 minutes       {Are you ordering a CV Procedure (e.g. stress test, cath, DCCV, TEE, etc)?   Press F2        :[id number]}   Follow-Up: No follow-ups on file.  I spent *** minutes in the care of Dyllon H Niziolek today including {CHL AMB CAR Time Based Billing Options STW (Optional):[phone]::documenting in the encounter.}      Signed, Alm MICAEL Clay, MD, MS Alm Clay, M.D., M.S. Interventional Cardiologist  [facility] Pager # [phone]

## 2024-05-14 ENCOUNTER — Ambulatory Visit: Payer: Self-pay | Admitting: Cardiology

## 2024-05-14 ENCOUNTER — Encounter: Payer: Self-pay | Admitting: Cardiology

## 2024-05-14 DIAGNOSIS — D649 Anemia, unspecified: Secondary | ICD-10-CM | POA: Insufficient documentation

## 2024-05-14 DIAGNOSIS — Z955 Presence of coronary angioplasty implant and graft: Secondary | ICD-10-CM | POA: Insufficient documentation

## 2024-05-14 DIAGNOSIS — G720 Drug-induced myopathy: Secondary | ICD-10-CM | POA: Insufficient documentation

## 2024-05-14 NOTE — Assessment & Plan Note (Signed)
 Has been unable to take statins or other oral medications.  Currently on Repatha  plus omega-3 fatty acids and co-Q10. Last lipid panel was in May of the previous year; due for a check to ensure treatment efficacy. - Check lipid panel today.

## 2024-05-14 NOTE — Assessment & Plan Note (Signed)
 Severe anemia improved with B12 injections; now maintained on oral B12 supplementation. Reports significant symptom improvement. - Continue B12 supplementation.

## 2024-05-14 NOTE — Assessment & Plan Note (Addendum)
 Coronary Artery Disease (CAD) with history of Coronary Artery Bypass Grafting (CABG) and stent placement Echocardiogram showed normal EF (55 to 60%) with GR 2 DD and abnormal relaxation and inferior wall hypokinesis, indicating distal RCA disease.SABRA  He remains asymptomatic with no chest pain, pressure, or dyspnea. Discussed longevity of arterial versus vein grafts.  For now, Surveillance stress testing unnecessary due to absence of symptoms (would be due for surveillance testing 2 years from now.. Post-eighty, testing guided by activity level and symptoms. - Discontinue aspirin  due to increased bleeding risk. - Continue clopidogrel  75 mg daily.  - Okay to hold Plavix  5 to 7 days preop for surgeries or procedures. - Continue current dose of beta-blocker (Bystolic  2.5 mg daily), ACE inhibitor (lisinopril  40 mg daily), and calcium  channel blocker (amlodipine  10.5 mg daily) - Continue Repatha . - Check cholesterol levels today. - Schedule follow-up in six months to reassess condition and discuss potential surveillance stress testing in 2028.  With potential hip surgery in the near future, as long as he remains asymptomatic without any chest pain or pressure or dyspnea at rest or exertion, no heart failure symptoms of PND, orthopnea or edema, or any signs or symptoms of arrhythmias, there would be no indication for any further testing prior to surgery.  He is easily able to achieve greater than 4 if not close to 8 METS => CV Risk Assessment/Calculations section.

## 2024-05-14 NOTE — Assessment & Plan Note (Signed)
 Blood pressure management supports heart function and prevents CAD-related complications. No peripheral edema or dyspnea. - Continue current antihypertensive regimen: After multiple different medication adjustments he is now stabilized back on Bystolic  2.5 mg daily along with amlodipine  7.5 mg daily and lisinopril  40 mg daily.

## 2024-05-14 NOTE — Assessment & Plan Note (Signed)
 Recent A1c was 7.1, slightly elevated, potentially due to recent cortisone injection. Under endocrinologist care. - Continue current diabetes medications, however I would consider switching from pioglitazone  to a different medication given his tendency to cardiomyopathy.SABRA

## 2024-05-14 NOTE — Assessment & Plan Note (Signed)
 Severe hip pain, worsened post-cortisone injection. Considering hip replacement surgery; currently limited in activity. Informed to hold clopidogrel  five days prior to surgery. - Consider hip replacement surgery if pain persists. - Okay to hold clopidogrel  for five days prior to surgery if decided.

## 2024-05-14 NOTE — Assessment & Plan Note (Signed)
 Over the years, he has become more more intolerant of statins to the point where he is unable to tolerate statins or other oral medications.  Tolerating Repatha .

## 2024-05-14 NOTE — Assessment & Plan Note (Signed)
 He is now greater than 2 years out from PCI to the LCx.  No further angina.   At this point I think it is safe to go and stop aspirin  and continue Plavix  75 mg monotherapy long-term based on these extensive disease. - Okay to hold Plavix  75 mg 5 to 7 days preop for surgeries or procedures. - Okay to hold Plavix  3 to 7 days for any significant bleeding or bruising especially any GI bleed.

## 2024-05-18 DIAGNOSIS — K080 Exfoliation of teeth due to systemic causes: Secondary | ICD-10-CM | POA: Diagnosis not present

## 2024-05-25 ENCOUNTER — Encounter: Payer: Self-pay | Admitting: Family Medicine

## 2024-05-25 ENCOUNTER — Ambulatory Visit: Admitting: Family Medicine

## 2024-05-25 VITALS — BP 130/80 | HR 65 | Temp 98.0°F | Wt 209.0 lb

## 2024-05-25 DIAGNOSIS — M5432 Sciatica, left side: Secondary | ICD-10-CM | POA: Diagnosis not present

## 2024-05-25 DIAGNOSIS — M25552 Pain in left hip: Secondary | ICD-10-CM | POA: Diagnosis not present

## 2024-05-25 MED ORDER — PREDNISONE 10 MG PO TABS: 10.0000 mg | ORAL_TABLET | Freq: Every day | ORAL | 2 refills | Status: DC

## 2024-05-25 MED ORDER — GABAPENTIN 300 MG PO CAPS: 900.0000 mg | ORAL_CAPSULE | Freq: Two times a day (BID) | ORAL | 5 refills | Status: DC

## 2024-05-25 NOTE — Progress Notes (Signed)
   Subjective:    Patient ID: [name], male    DOB: 1951/04/26, 73 y.o.   MRN: [id number]  HPI Here for worsening pain in the left hip. He saw Dr. Addie on  03-29-24, and Xrays revealed severe OA in the hip. He was given a steroid injection that day, but the pain has continued to get worse. He is taking Gabapentin  600 mg BID, ES Tylenol , and Prednisone  10 mg daily. He has decided that he wants to go ahead with hip replacement surgery. Of note, his last A1c on 05-02-24 was 7.1%.    Review of Systems  Constitutional: Negative.   Respiratory: Negative.    Cardiovascular: Negative.   Musculoskeletal:  Positive for arthralgias.       Objective:   Physical Exam Constitutional:      Comments: In pain, limping   Cardiovascular:     Rate and Rhythm: Normal rate and regular rhythm.     Pulses: Normal pulses.     Heart sounds: Normal heart sounds.  Pulmonary:     Effort: Pulmonary effort is normal.     Breath sounds: Normal breath sounds.  Neurological:     Mental Status: He is alert.           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 Even though we know the Prednisone  can elevate his glucoses levels, we agreed to stay on 10 mg daily. We will increase the Gabapentin  to 900 mg BID. I advised him to follow up with Dr. Addie asap . Garnette Olmsted, MD

## 2024-06-10 ENCOUNTER — Other Ambulatory Visit: Payer: Self-pay | Admitting: Family Medicine

## 2024-06-12 ENCOUNTER — Telehealth: Payer: Self-pay | Admitting: Family Medicine

## 2024-06-12 ENCOUNTER — Other Ambulatory Visit: Payer: Self-pay

## 2024-06-12 DIAGNOSIS — M5432 Sciatica, left side: Secondary | ICD-10-CM

## 2024-06-12 NOTE — Telephone Encounter (Unsigned)
 Copied from CRM [id number]. Topic: Clinical - Medication Refill >> Jun 12, 2024 10:35 AM Aisha D wrote: Medication: gabapentin  (NEURONTIN ) 300 MG capsule  Has the patient contacted their pharmacy? Yes (Agent: If no, request that the patient contact the pharmacy for the refill. If patient does not wish to contact the pharmacy document the reason why and proceed with request.) (Agent: If yes, when and what did the pharmacy advise?)  This is the patient's preferred pharmacy:  [id number] [place], [place] - [address] [place] [address] Phone: [phone] Fax: [phone]   Is this the correct pharmacy for this prescription? Yes If no, delete pharmacy and type the correct one.   Has the prescription been filled recently? No  Is the patient out of the medication? No  Has the patient been seen for an appointment in the last year OR does the patient have an upcoming appointment? Yes  Can we respond through MyChart? No  Agent: Please be advised that Rx refills may take up to 3 business days. We ask that you follow-up with your pharmacy.

## 2024-06-15 ENCOUNTER — Telehealth: Payer: Self-pay | Admitting: Family Medicine

## 2024-06-15 MED ORDER — NEBIVOLOL HCL 2.5 MG PO TABS: 2.5000 mg | ORAL_TABLET | Freq: Every day | ORAL | 3 refills | Status: AC

## 2024-06-15 NOTE — Telephone Encounter (Signed)
 Inappropriate medication refill requests for PCP. Nebivilol refilled today by prescriber/specialist. Clopidogrel  is managed and prescribed by cardiologist/specialist. Pantoprazole  is managed and prescribed by gastro/specialist. Gabapentin  already denied for refill on 06/14/24 by PCP due to patient has active prescription with 5 refills on file at his pharmacy. Closing encounter.

## 2024-06-15 NOTE — Telephone Encounter (Signed)
 Copied from CRM #[id number]. Topic: Clinical - Medication Refill >> Jun 15, 2024  9:56 AM Berneda FALCON wrote: Medication: clopidogrel  (PLAVIX ) 75 MG tablet  nebivolol  (BYSTOLIC ) 2.5 MG tablet  pantoprazole  (PROTONIX ) 40 MG tablet  gabapentin  (NEURONTIN ) 300 MG capsule   Wife Arland states these were requested by the pharmacy but none have been refilled as of yet. I see some of them were requested while others were not.  Has the patient contacted their pharmacy? Yes (Agent: If no, request that the patient contact the pharmacy for the refill. If patient does not wish to contact the pharmacy document the reason why and proceed with request.) (Agent: If yes, when and what did the pharmacy advise?)  This is the patient's preferred pharmacy:  [id number] [place], [place] - [address] [place] [address] Phone: [phone] Fax: [phone]  Is this the correct pharmacy for this prescription? Yes If no, delete pharmacy and type the correct one.   Has the prescription been filled recently? No  Is the patient out of the medication? Yes  Has the patient been seen for an appointment in the last year OR does the patient have an upcoming appointment? Yes  Can we respond through MyChart? Yes  Agent: Please be advised that Rx refills may take up to 3 business days. We ask that you follow-up with your pharmacy.

## 2024-06-20 ENCOUNTER — Telehealth: Payer: Self-pay | Admitting: *Deleted

## 2024-06-20 ENCOUNTER — Encounter: Payer: Self-pay | Admitting: Family Medicine

## 2024-06-20 ENCOUNTER — Ambulatory Visit: Admitting: Family Medicine

## 2024-06-20 ENCOUNTER — Other Ambulatory Visit: Payer: Self-pay

## 2024-06-20 DIAGNOSIS — Z0000 Encounter for general adult medical examination without abnormal findings: Secondary | ICD-10-CM | POA: Diagnosis not present

## 2024-06-20 MED ORDER — REPATHA SURECLICK 140 MG/ML ~~LOC~~ SOAJ: 140.0000 mg | SUBCUTANEOUS | 3 refills | Status: AC

## 2024-06-20 NOTE — Telephone Encounter (Signed)
 Copied from CRM #[id number]. Topic: General - Other >> May 30, 2024 11:18 AM Maisie BROCKS wrote: Reason for CRM: Alan from [facility] called to confirm if a form was received that discuss the patient's intolerance to statin therapy.They originally sent 10/27. Please confirm if form was received at [phone] exts. [phone].   She mentioned that she will be faxing it again today.

## 2024-06-20 NOTE — Patient Instructions (Signed)
 I really enjoyed getting to talk with you today! I am available on Tuesdays and Thursdays for virtual visits if you have any questions or concerns, or if I can be of any further assistance.   CHECKLIST FROM ANNUAL WELLNESS VISIT:  -Follow up (please call to schedule if not scheduled after visit):   -yearly for annual wellness visit with primary care office  Here is a list of your preventive care/health maintenance measures and the plan for each if any are due:  PLAN For any measures below that may be due:   Health Maintenance  Topic Date Due   COVID-19 Vaccine (1) 07/06/2024 (Originally 04/04/1951)   Zoster Vaccines- Shingrix (1 of 2) 09/18/2024 (Originally 10/03/1969)   Influenza Vaccine  10/03/2024 (Originally 02/04/2024)   DTaP/Tdap/Td (2 - Td or Tdap) 06/20/2025 (Originally 04/01/2023)   Pneumococcal Vaccine: 50+ Years (1 of 2 - PCV) 06/20/2025 (Originally 10[birth year]03/1969)   Hepatitis C Screening  06/20/2025 (Originally [birth year]04/1968)   Diabetic kidney evaluation - eGFR measurement  08/29/2024   OPHTHALMOLOGY EXAM  09/27/2024   Diabetic kidney evaluation - Urine ACR  10/31/2024   HEMOGLOBIN A1C  10/31/2024   FOOT EXAM  05/02/2025   Medicare Annual Wellness (AWV)  06/20/2025   Meningococcal B Vaccine  Aged Out   Colonoscopy  Discontinued    -See a dentist at least yearly  -Get your eyes checked and then per your eye specialist's recommendations  -Other issues addressed today:   -I have included below further information regarding a healthy whole foods based diet, physical activity guidelines for adults, stress management and opportunities for social connections. I hope you find this information useful.   -----------------------------------------------------------------------------------------------------------------------------------------------------------------------------------------------------------------------------------------------------------    NUTRITION: -eat real food:  lots of colorful vegetables (half the plate) and fruits -5-7 servings of vegetables and fruits per day (fresh or steamed is best), exp. 2 servings of vegetables with lunch and dinner and 2 servings of fruit per day. Berries and greens such as kale and collards are great choices.  -consume on a regular basis:  fresh fruits, fresh veggies, fish, nuts, seeds, healthy oils (such as olive oil, avocado oil), whole grains (make sure for bread/pasta/crackers/etc., that the first ingredient on label contains the word whole), legumes. -can eat small amounts of dairy and lean meat (no larger than the palm of your hand), but avoid processed meats such as ham, bacon, lunch meat, etc. -drink water -try to avoid fast food and pre-packaged foods, processed meat, ultra processed foods/beverages (donuts, candy, etc.) -most experts advise limiting sodium to < 2300mg  per day, should limit further is any chronic conditions such as high blood pressure, heart disease, diabetes, etc. The American Heart Association advised that < 1500mg  is is ideal -try to avoid foods/beverages that contain any ingredients with names you do not recognize  -try to avoid foods/beverages  with added sugar or sweeteners/sweets  -try to avoid sweet drinks (including diet drinks): soda, juice, Gatorade, sweet tea, power drinks, diet drinks -try to avoid white rice, white bread, pasta (unless whole grain)  EXERCISE GUIDELINES FOR ADULTS: -if you wish to increase your physical activity, do so gradually and with the approval of your doctor -STOP and seek medical care immediately if you have any chest pain, chest discomfort or trouble breathing when starting or increasing exercise  -move and stretch your body, legs, feet and arms when sitting for long periods -Physical activity guidelines for optimal health in adults: -get at least 150 minutes per week of moderate exercise (can talk, but  not sing); this is about 20-30 minutes of sustained activity  5-7 days per week or two 10-15 minute episodes of sustained activity 5-7 days per week -do some muscle building/resistance training/strength training at least 2 days per week  -balance exercises 3+ days per week:   Stand somewhere where you have something sturdy to hold onto if you lose balance    1) lift up on toes, then back down, start with 5x per day and work up to 20x   2) stand and lift one leg straight out to the side so that foot is a few inches of the floor, start with 5x each side and work up to 20x each side   3) stand on one foot, start with 5 seconds each side and work up to 20 seconds on each side  If you need ideas or help with getting more active:  -Silver sneakers https://tools.silversneakers.com  -Walk with a Doc: Http://www.duncan-williams.com/  -try to include resistance (weight lifting/strength building) and balance exercises twice per week: or the following link for ideas: http://castillo-powell.com/  buyducts.dk  STRESS MANAGEMENT: -can try meditating, or just sitting quietly with deep breathing while intentionally relaxing all parts of your body for 5 minutes daily -if you need further help with stress, anxiety or depression please follow up with your primary doctor or contact the wonderful folks at [facility]: [phone]  SOCIAL CONNECTIONS: -options in [place] if you wish to engage in more social and exercise related activities:  -Silver sneakers https://tools.silversneakers.com  -Walk with a Doc: Http://www.duncan-williams.com/  -Check out the [facility] Active Adults 50+ section on the [employer] (hiking clubs, book clubs, cards and games, chess, exercise classes, aquatic classes and much more) - see the website for details: https://www.[place].gov/departments/parks-recreation/active-adults50  -YouTube has lots of exercise videos for  different ages and abilities as well  -[facility] (a variety of indoor and outdoor inperson activities for adults). [phone]. [address].  -Virtual Online Classes (a variety of topics): see seniorplanet.org or call [phone]  -consider volunteering at a school, hospice center, church, senior center or elsewhere

## 2024-06-20 NOTE — Progress Notes (Signed)
 ----------------------------------------------------------------------------------------------------------------------------------------------------------------------------------------------------------------------  Because this visit was a virtual/telehealth visit, some criteria may be missing or patient reported. Any vitals not documented were not able to be obtained and vitals that have been documented are patient reported.    MEDICARE ANNUAL PREVENTIVE CARE VISIT WITH PROVIDER (Welcome to Medicare, initial annual wellness or annual wellness exam)  Virtual Visit via Phone Note  I connected with [name] on 06/20/2024  by phone and verified that I am speaking with the correct person using two identifiers.  Location patient: home Location provider:work or home office Persons participating in the virtual visit: patient, provider  Concerns and/or follow up today: detailed intake and health/risks assessment completed on flow sheets and below- please see for details. Reports all is stable. Deals with Chronic hip pain - is seeing Orthopedics for this - sees him tomorrow.  How often do you have a drink containing alcohol?n How many drinks containing alcohol do you have on a typical day when you are drinking?na How often do you have six or more drinks on one occasion?na Have you ever smoked?y Quit date if applicable? Quit 50 years ago  How many packs a day do/did you smoke? na Do you use smokeless tobacco?n Do you use an illicit drugs?n Do you feel safe at home?n Last dentist visit? See dentist on regular basis Last eye Exam and location?Has transition lenses - is going to get clear lenses, Sees Dr. Joshua and already got Rx   See HM section in Epic for other details of completed HM.    ROS: negative for report of fevers, unintentional weight loss, vision changes, vision loss, hearing loss or change, chest pain, sob, hemoptysis, melena, hematochezia, hematuria,bleeding or  bruising  Patient-completed extensive health risk assessment - reviewed and discussed with the patient: See Health Risk Assessment completed with patient prior to the visit either above or in recent phone note. This was reviewed in detailed with the patient today and appropriate recommendations, orders and referrals were placed as needed per Summary below and patient instructions.   Review of Medical History: -PMH, PSH, Family History and current specialty and care providers reviewed and updated and listed below   Patient Care Team: Johnny Garnette LABOR, MD as PCP - General Anner Alm ORN, MD as PCP - Cardiology (Cardiology) Joshua Rush, OD (Optometry) Anner Alm ORN, MD as Consulting Physician (Cardiology)   Past Medical History:  Diagnosis Date   Chronic headache    Chronic neck pain    Hyperlipidemia    Hypertension 12/26/2008   ECHO- EF>55%   Insomnia     Past Surgical History:  Procedure Laterality Date   APPENDECTOMY     cervical spine injection     COLONOSCOPY  04-02-06    per [facility], clear, repeat in 10 yrs   CORONARY ARTERY BYPASS GRAFT  2002   4 vessels   CORONARY PRESSURE/FFR STUDY N/A 01/12/2022   Procedure: INTRAVASCULAR PRESSURE WIRE/FFR STUDY;  Surgeon: Court Dorn PARAS, MD;  Location: [facility];  Service: Cardiovascular;  Laterality: N/A;   CORONARY STENT INTERVENTION N/A 01/12/2022   Procedure: CORONARY STENT INTERVENTION;  Surgeon: Court Dorn PARAS, MD;  Location: [facility];  Service: Cardiovascular;  Laterality: N/A;   LEFT HEART CATH AND CORS/GRAFTS ANGIOGRAPHY N/A 01/12/2022   Procedure: LEFT HEART CATH AND CORS/GRAFTS ANGIOGRAPHY;  Surgeon: Court Dorn PARAS, MD;  Location: [facility];  Service: Cardiovascular;  Laterality: N/A;    Social History   Socioeconomic History  Marital status: Married    Spouse name: Not on file   Number of children: Not on file   Years of education: Not on file   Highest education level: Not  on file  Occupational History   Not on file  Tobacco Use   Smoking status: Former    Current packs/day: 0.00    Average packs/day: 0.3 packs/day for 10.0 years (2.5 ttl pk-yrs)    Types: Cigarettes    Start date: 11/25/1960    Quit date: 11/26/1970    Years since quitting: 53.6   Smokeless tobacco: Never  Vaping Use   Vaping status: Never Used  Substance and Sexual Activity   Alcohol use: No    Alcohol/week: 0.0 standard drinks of alcohol   Drug use: No   Sexual activity: Yes  Other Topics Concern   Not on file  Social History Narrative   Not on file   Social Drivers of Health   Tobacco Use: Medium Risk (06/20/2024)   Patient History    Smoking Tobacco Use: Former    Smokeless Tobacco Use: Never    Passive Exposure: Not on Actuary Strain: Low Risk (10/13/2021)   Overall Financial Resource Strain (CARDIA)    Difficulty of Paying Living Expenses: Not hard at all  Food Insecurity: No Food Insecurity (06/20/2024)   Epic    Worried About Radiation Protection Practitioner of Food in the Last Year: Never true    Ran Out of Food in the Last Year: Never true  Transportation Needs: No Transportation Needs (10/13/2021)   PRAPARE - Administrator, Civil Service (Medical): No    Lack of Transportation (Non-Medical): No  Physical Activity: Sufficiently Active (06/20/2024)   Exercise Vital Sign    Days of Exercise per Week: 5 days    Minutes of Exercise per Session: 30 min  Stress: No Stress Concern Present (06/20/2024)   [employer] of Occupational Health - Occupational Stress Questionnaire    Feeling of Stress: Not at all  Social Connections: Socially Integrated (06/20/2024)   Social Connection and Isolation Panel    Frequency of Communication with Friends and Family: More than three times a week    Frequency of Social Gatherings with Friends and Family: More than three times a week    Attends Religious Services: More than 4 times per year    Active Member of Clubs or  Organizations: Yes    Attends Banker Meetings: More than 4 times per year    Marital Status: Married  Catering Manager Violence: Not At Risk (10/13/2021)   Humiliation, Afraid, Rape, and Kick questionnaire    Fear of Current or Ex-Partner: No    Emotionally Abused: No    Physically Abused: No    Sexually Abused: No  Depression (PHQ2-9): Medium Risk (06/20/2024)   Depression (PHQ2-9)    PHQ-2 Score: 6  Alcohol Screen: Low Risk (10/13/2021)   Alcohol Screen    Last Alcohol Screening Score (AUDIT): 0  Housing: Low Risk (10/13/2021)   Housing    Last Housing Risk Score: 0  Utilities: Not on file  Health Literacy: Adequate Health Literacy (06/20/2024)   B1300 Health Literacy    Frequency of need for help with medical instructions: Never    Family History  Problem Relation Age of Onset   Heart disease Mother    Liver cancer Father    Cancer Brother    Cancer Brother    Cancer Brother    Coronary artery disease  Other    Hypertension Other    Colon cancer Neg Hx    Stomach cancer Neg Hx    Esophageal cancer Neg Hx    Colon polyps Neg Hx    Rectal cancer Neg Hx     Medications Ordered Prior to Encounter[1]  Allergies[2]     Physical Exam Vitals requested from patient and listed below if patient had equipment and was able to obtain at home for this virtual visit: There were no vitals filed for this visit. Estimated body mass index is 30.42 kg/m as calculated from the following:   Height as of 05/09/24: 5' 9.5 (1.765 m).   Weight as of 05/25/24: 209 lb (94.8 kg).  EKG (optional): deferred due to virtual visit  GENERAL: alert, oriented, no acute distress detected; full vision exam deferred due to pandemic and/or virtual encounter  PSYCH/NEURO: pleasant and cooperative, no obvious depression or anxiety, speech and thought processing grossly intact, Cognitive function grossly intact  Flowsheet Row Clinical Support from 06/20/2024 in [facility] at [facility]  PHQ-9 Total Score 6        06/20/2024    4:08 PM 04/05/2024    2:31 PM 01/06/2023   10:46 AM 10/15/2022   11:37 AM 12/02/2021   11:33 AM  Depression screen PHQ 2/9  Decreased Interest 0 0 0 0 0  Down, Depressed, Hopeless 0 0 0 0 0  PHQ - 2 Score 0 0 0 0 0  Altered sleeping 3  3  2   Tired, decreased energy 3  3  2   Change in appetite 0  0  0  Feeling bad or failure about yourself  0  0  0  Trouble concentrating 0  0  0  Moving slowly or fidgety/restless 0  0  0  Suicidal thoughts 0  0  0  PHQ-9 Score 6  6   4    Difficult doing work/chores   Not difficult at all       Data saved with a previous flowsheet row definition       01/13/2022    8:00 AM 10/15/2022   11:35 AM 01/06/2023   10:46 AM 06/20/2024    4:08 PM 06/20/2024    4:30 PM  Fall Risk  Falls in the past year?  1 0 0   Was there an injury with Fall?  0  0  0   Fall Risk Category Calculator  1 0 0   (RETIRED) Patient Fall Risk Level Low fall risk       Patient at Risk for Falls Due to  No Fall Risks No Fall Risks No Fall Risks No Fall Risks  Fall risk Follow up  Falls evaluation completed Falls evaluation completed Falls evaluation completed Falls evaluation completed     Data saved with a previous flowsheet row definition     SUMMARY AND PLAN:  Encounter for Medicare annual wellness exam   Discussed applicable health maintenance/preventive health measures and advised and referred or ordered per patient preferences: -he declined all vaccines  Health Maintenance  Topic Date Due   COVID-19 Vaccine (1) 07/06/2024 (Originally 04/04/1951)   Zoster Vaccines- Shingrix (1 of 2) 09/18/2024 (Originally 10/03/1969)   Influenza Vaccine  10/03/2024 (Originally 02/04/2024)   DTaP/Tdap/Td (2 - Td or Tdap) 06/20/2025 (Originally 04/01/2023)   Pneumococcal Vaccine: 50+ Years (1 of 2 - PCV) 06/20/2025 (Originally 10[birth year]03/1969)   Hepatitis C Screening  06/20/2025 (Originally 10/04/1968)   Diabetic kidney  evaluation - eGFR measurement  08/29/2024   OPHTHALMOLOGY EXAM  09/27/2024   Diabetic kidney evaluation - Urine ACR  10/31/2024   HEMOGLOBIN A1C  10/31/2024   FOOT EXAM  05/02/2025   Medicare Annual Wellness (AWV)  06/20/2025   Meningococcal B Vaccine  Aged Out   Colonoscopy  Discontinued     Education and counseling on the following was provided based on the above review of health and a plan/checklist for the patient, along with additional information discussed, was provided for the patient in the patient instructions :  -Advised and counseled on a healthy lifestyle - including the importance of a healthy diet, regular physical activity, social connections and stress management. -Reviewed patient's current diet. Advised and counseled on a whole foods based healthy diet. A summary of a healthy diet was provided in the Patient Instructions.  -reviewed patient's current physical activity level and discussed exercise guidelines for adults. Discussed community resources and ideas for safe exercise at home to assist in meeting exercise guideline recommendations in a safe and healthy way.  -Advise yearly dental visits at minimum and regular eye exams   Follow up: see patient instructions   Patient Instructions  I really enjoyed getting to talk with you today! I am available on Tuesdays and Thursdays for virtual visits if you have any questions or concerns, or if I can be of any further assistance.   CHECKLIST FROM ANNUAL WELLNESS VISIT:  -Follow up (please call to schedule if not scheduled after visit):   -yearly for annual wellness visit with primary care office  Here is a list of your preventive care/health maintenance measures and the plan for each if any are due:  PLAN For any measures below that may be due:   Health Maintenance  Topic Date Due   COVID-19 Vaccine (1) 07/06/2024 (Originally 04/04/1951)   Zoster Vaccines- Shingrix (1 of 2) 09/18/2024 (Originally 10/03/1969)    Influenza Vaccine  10/03/2024 (Originally 02/04/2024)   DTaP/Tdap/Td (2 - Td or Tdap) 06/20/2025 (Originally 04/01/2023)   Pneumococcal Vaccine: 50+ Years (1 of 2 - PCV) 06/20/2025 (Originally 10/03/1969)   Hepatitis C Screening  06/20/2025 (Originally 10/04/1968)   Diabetic kidney evaluation - eGFR measurement  08/29/2024   OPHTHALMOLOGY EXAM  09/27/2024   Diabetic kidney evaluation - Urine ACR  10/31/2024   HEMOGLOBIN A1C  10/31/2024   FOOT EXAM  05/02/2025   Medicare Annual Wellness (AWV)  06/20/2025   Meningococcal B Vaccine  Aged Out   Colonoscopy  Discontinued    -See a dentist at least yearly  -Get your eyes checked and then per your eye specialist's recommendations  -Other issues addressed today:   -I have included below further information regarding a healthy whole foods based diet, physical activity guidelines for adults, stress management and opportunities for social connections. I hope you find this information useful.   -----------------------------------------------------------------------------------------------------------------------------------------------------------------------------------------------------------------------------------------------------------    NUTRITION: -eat real food: lots of colorful vegetables (half the plate) and fruits -5-7 servings of vegetables and fruits per day (fresh or steamed is best), exp. 2 servings of vegetables with lunch and dinner and 2 servings of fruit per day. Berries and greens such as kale and collards are great choices.  -consume on a regular basis:  fresh fruits, fresh veggies, fish, nuts, seeds, healthy oils (such as olive oil, avocado oil), whole grains (make sure for bread/pasta/crackers/etc., that the first ingredient on label contains the word whole), legumes. -can eat small amounts of dairy and lean meat (no larger than the palm of your hand), but avoid  processed meats such as ham, bacon, lunch meat, etc. -drink  water -try to avoid fast food and pre-packaged foods, processed meat, ultra processed foods/beverages (donuts, candy, etc.) -most experts advise limiting sodium to < 2300mg  per day, should limit further is any chronic conditions such as high blood pressure, heart disease, diabetes, etc. The American Heart Association advised that < 1500mg  is is ideal -try to avoid foods/beverages that contain any ingredients with names you do not recognize  -try to avoid foods/beverages  with added sugar or sweeteners/sweets  -try to avoid sweet drinks (including diet drinks): soda, juice, Gatorade, sweet tea, power drinks, diet drinks -try to avoid white rice, white bread, pasta (unless whole grain)  EXERCISE GUIDELINES FOR ADULTS: -if you wish to increase your physical activity, do so gradually and with the approval of your doctor -STOP and seek medical care immediately if you have any chest pain, chest discomfort or trouble breathing when starting or increasing exercise  -move and stretch your body, legs, feet and arms when sitting for long periods -Physical activity guidelines for optimal health in adults: -get at least 150 minutes per week of moderate exercise (can talk, but not sing); this is about 20-30 minutes of sustained activity 5-7 days per week or two 10-15 minute episodes of sustained activity 5-7 days per week -do some muscle building/resistance training/strength training at least 2 days per week  -balance exercises 3+ days per week:   Stand somewhere where you have something sturdy to hold onto if you lose balance    1) lift up on toes, then back down, start with 5x per day and work up to 20x   2) stand and lift one leg straight out to the side so that foot is a few inches of the floor, start with 5x each side and work up to 20x each side   3) stand on one foot, start with 5 seconds each side and work up to 20 seconds on each side  If you need ideas or help with getting more active:  -Silver  sneakers https://tools.silversneakers.com  -Walk with a Doc: Http://www.duncan-williams.com/  -try to include resistance (weight lifting/strength building) and balance exercises twice per week: or the following link for ideas: http://castillo-powell.com/  buyducts.dk  STRESS MANAGEMENT: -can try meditating, or just sitting quietly with deep breathing while intentionally relaxing all parts of your body for 5 minutes daily -if you need further help with stress, anxiety or depression please follow up with your primary doctor or contact the wonderful folks at [facility]: [phone]  SOCIAL CONNECTIONS: -options in [place] if you wish to engage in more social and exercise related activities:  -Silver sneakers https://tools.silversneakers.com  -Walk with a Doc: Http://www.duncan-williams.com/  -Check out the [facility] Active Adults 50+ section on the [place] of [employer] companies (hiking clubs, book clubs, cards and games, chess, exercise classes, aquatic classes and much more) - see the website for details: https://www.[place].gov/departments/parks-recreation/active-adults50  -YouTube has lots of exercise videos for different ages and abilities as well  -[facility] (a variety of indoor and outdoor inperson activities for adults). [phone]. [address]  -Virtual Online Classes (a variety of topics): see seniorplanet.org or call [phone]  -consider volunteering at a school, hospice center, church, senior center or elsewhere            Chiquita JONELLE Cramp, DO     [1]  Current Outpatient Medications on File Prior to Visit  Medication Sig Dispense Refill   ACCU-CHEK GUIDE TEST test  strip USE TO TEST BLOOD SUGAR EVERY DAY 100 strip 3   amLODipine  (NORVASC ) 5 MG tablet TAKE 1 AND 1/2 TABLETS DAILY BY MOUTH 135 tablet 3   clonazePAM  (KLONOPIN ) 0.5 MG  tablet Take 1 tablet (0.5 mg total) by mouth 2 (two) times daily as needed for anxiety. 60 tablet 5   clopidogrel  (PLAVIX ) 75 MG tablet TAKE 1 TABLET BY MOUTH EVERY DAY WITH BREAKFAST 90 tablet 3   Coenzyme Q10 (COQ10) 100 MG CAPS Take 100 mg by mouth daily.      dapagliflozin  propanediol (FARXIGA ) 10 MG TABS tablet Take 1 tablet (10 mg total) by mouth daily. 90 tablet 3   Evolocumab  (REPATHA  SURECLICK) 140 MG/ML SOAJ Inject 140 mg into the skin every 14 (fourteen) days. 6 mL 3   ferrous sulfate  325 (65 FE) MG tablet TAKE 325 MG BY MOUTH 2 (TWO) TIMES DAILY. 180 tablet 1   gabapentin  (NEURONTIN ) 300 MG capsule Take 3 capsules (900 mg total) by mouth 2 (two) times daily. Take 2 (600 mg) capsules by mouth twice daily 180 capsule 5   glipiZIDE  (GLUCOTROL ) 5 MG tablet Take 1 tablet (5 mg total) by mouth daily. 90 tablet 3   lisinopril  (ZESTRIL ) 40 MG tablet TAKE 1 TABLET BY MOUTH EVERY DAY 90 tablet 2   MELATONIN GUMMIES PO Take 2 each by mouth at bedtime.     meloxicam  (MOBIC ) 7.5 MG tablet Take 1 tablet (7.5 mg total) by mouth daily. 90 tablet 3   nebivolol  (BYSTOLIC ) 2.5 MG tablet Take 1 tablet (2.5 mg total) by mouth daily. 90 tablet 3   nitroGLYCERIN  (NITROSTAT ) 0.4 MG SL tablet Place 1 tablet (0.4 mg total) under the tongue every 5 (five) minutes as needed for chest pain. 25 tablet 6   Omega-3 350 MG CPDR Take 350 mg by mouth daily.     pantoprazole  (PROTONIX ) 40 MG tablet TAKE 1 TABLET BY MOUTH TWICE A DAY 180 tablet 6   pioglitazone  (ACTOS ) 45 MG tablet Take 1 tablet (45 mg total) by mouth daily. 90 tablet 3   predniSONE  (DELTASONE ) 10 MG tablet Take 1 tablet (10 mg total) by mouth daily with breakfast. 30 tablet 2   sildenafil  (VIAGRA ) 100 MG tablet Take 1 tablet (100 mg total) by mouth daily as needed for erectile dysfunction. 10 tablet 1   sulfamethoxazole -trimethoprim  (BACTRIM  DS) 800-160 MG tablet TAKE 1 TABLET BY MOUTH 2 (TWO) TIMES DAILY AS NEEDED (URINE SYMPTOMS). 60 tablet 2    tamsulosin  (FLOMAX ) 0.4 MG CAPS capsule TAKE 1 CAPSULE BY MOUTH EVERY DAY 90 capsule 3   traZODone  (DESYREL ) 100 MG tablet TAKE 1 TABLET BY MOUTH EVERYDAY AT BEDTIME 90 tablet 3   traZODone  (DESYREL ) 50 MG tablet TAKE 1 TABLET BY MOUTH EVERYDAY AT BEDTIME 90 tablet 2   No current facility-administered medications on file prior to visit.  [2]  Allergies Allergen Reactions   Codeine Anaphylaxis    Pt. verbalized that he has taken and can take Hydrocodone , Oxycodone  w/o issue - codeine allergy only   Ambien [Zolpidem Tartrate] Other (See Comments)    Headache    Cipro [Ciprofloxacin Hcl] Hives and Rash   Crestor  [Rosuvastatin ] Other (See Comments)    Myalgia    Isordil  [Isosorbide  Nitrate] Other (See Comments)    Headaches   Lipitor [Atorvastatin] Other (See Comments)    Myalgia    Victoza  [Liraglutide ] Rash   Zetia  [employer]cher[employer]base ] Other (See Comments)    Myalgia

## 2024-06-20 NOTE — Telephone Encounter (Signed)
 Spoke with Alan from [place] advised that our office has not received pt forms, agent will refax the form for Dr Johnny to complete.Awaiting to receive  the forms

## 2024-06-21 ENCOUNTER — Encounter: Payer: Self-pay | Admitting: Orthopedic Surgery

## 2024-06-21 ENCOUNTER — Ambulatory Visit: Admitting: Orthopedic Surgery

## 2024-06-21 DIAGNOSIS — M1612 Unilateral primary osteoarthritis, left hip: Secondary | ICD-10-CM | POA: Diagnosis not present

## 2024-06-21 DIAGNOSIS — M25552 Pain in left hip: Secondary | ICD-10-CM

## 2024-06-21 DIAGNOSIS — M5416 Radiculopathy, lumbar region: Secondary | ICD-10-CM

## 2024-06-21 NOTE — Progress Notes (Signed)
 Office Visit Note   Patient: [name]           Date of Birth: 10/22/1950           MRN: [id number] Visit Date: 06/21/2024 Requested by: Johnny Garnette LABOR, MD [address] [place],  [place] [address] PCP: Johnny Garnette LABOR, MD  Subjective: Chief Complaint  Patient presents with   Left Hip - Pain    HPI: [name] is a 73 y.o. male who presents to the office reporting left hip and leg pain.  He was last seen back in September.  Had an injection in the left hip which did not give him really any relief.  Has had increased pain since that time.  Does describe pain which radiates down to the mid calf and leg region.  He also reports some groin pain but a lot of pain at the anterior superior iliac crest region as well.  Having some difficulty getting up and down.  Using a cane.  Does have a cardiac history with stent placement about 4 years ago.  States that he has seen his cardiologist who told him that he could come off Plavix  for about a week before any invasive procedures..                ROS: All systems reviewed are negative as they relate to the chief complaint within the history of present illness.  Patient denies fevers or chills.  Assessment & Plan: Visit Diagnoses: No diagnosis found.  Plan: Impression is left hip arthritis with no improvement after cortisone injection in the hip.  His blood glucose did go up after that injection.  I think he also has a component of radiculopathy on the left-hand side.  CT scan from 2023 did show L3-4 foraminal disc as well as L4-5 larger disc.  He is having radiating radicular symptoms in that leg and also has a little bit of thigh atrophy on the left compared to the right by a centimeter and a half.  He is likely heading for left hip replacement but we need to sort out his back situation first.  CT myelogram lumbar spine indicated to evaluate source of left sided radiculopathy.  He also does give some history of walking his dogs and  having spinal stenosis type symptoms where he had to stop walking because the leg pain became so severe.  Follow-Up Instructions: No follow-ups on file.   Orders:  No orders of the defined types were placed in this encounter.  No orders of the defined types were placed in this encounter.     Procedures: No procedures performed   Clinical Data: No additional findings.  Objective: Vital Signs: There were no vitals taken for this visit.  Physical Exam:  Constitutional: Patient appears well-developed HEENT:  Head: Normocephalic Eyes:EOM are normal Neck: Normal range of motion Cardiovascular: Normal rate Pulmonary/chest: Effort normal Neurologic: Patient is alert Skin: Skin is warm Psychiatric: Patient has normal mood and affect  Ortho Exam: Ortho exam demonstrates mild atrophy left leg versus right.  About 1-1/2 cm at mid thigh region.  Does have some hip flexion weakness on the left compared to the right at 4- out of 5 compared to 5+ out of 5.  Does have groin pain with internal/external rotation of that left leg.  Ankle dorsiflexion plantarflexion leg extension strength intact.  The leg extension is about a half grade weaker on the left compared to the right.  Does have excellent  abduction and adduction strength bilaterally.  Has some pain in the superior SI joint region on the left compared to the right.  Specialty Comments:  No specialty comments available.  Imaging: No results found.   PMFS History: Patient Active Problem List   Diagnosis Date Noted   Presence of drug coated stent in left circumflex coronary artery 05/14/2024   Chronic anemia 05/14/2024   Statin myopathy 05/14/2024   Pain in left hip 02/26/2022   Unstable angina (HCC) 01/10/2022   BPH with urinary obstruction 08/05/2021   Erectile dysfunction 08/05/2021   Type 2 diabetes mellitus without complication, without long-term current use of insulin  (HCC) 08/04/2017   Osteoarthritis 04/28/2017    Insomnia 03/03/2017   Fatigue 05/28/2014   Anxiety state 06/09/2010   NECK PAIN 09/03/2009   LEG PAIN 07/16/2008   HEADACHE 07/16/2008   LOSS, HEARING NOS 05/03/2007   Hyperlipidemia with target low density lipoprotein (LDL) cholesterol less than 55 mg/dL 90/70/7991   Essential hypertension 04/04/2007   Coronary artery disease involving native coronary artery without angina pectoris 2002   Past Medical History:  Diagnosis Date   Chronic headache    Chronic neck pain    Hyperlipidemia    Hypertension 12/26/2008   ECHO- EF>55%   Insomnia     Family History  Problem Relation Age of Onset   Heart disease Mother    Liver cancer Father    Cancer Brother    Cancer Brother    Cancer Brother    Coronary artery disease Other    Hypertension Other    Colon cancer Neg Hx    Stomach cancer Neg Hx    Esophageal cancer Neg Hx    Colon polyps Neg Hx    Rectal cancer Neg Hx     Past Surgical History:  Procedure Laterality Date   APPENDECTOMY     cervical spine injection     COLONOSCOPY  04-02-06    per [facility], clear, repeat in 10 yrs   CORONARY ARTERY BYPASS GRAFT  2002   4 vessels   CORONARY PRESSURE/FFR STUDY N/A 01/12/2022   Procedure: INTRAVASCULAR PRESSURE WIRE/FFR STUDY;  Surgeon: Court Dorn PARAS, MD;  Location: [facility];  Service: Cardiovascular;  Laterality: N/A;   CORONARY STENT INTERVENTION N/A 01/12/2022   Procedure: CORONARY STENT INTERVENTION;  Surgeon: Court Dorn PARAS, MD;  Location: [facility];  Service: Cardiovascular;  Laterality: N/A;   LEFT HEART CATH AND CORS/GRAFTS ANGIOGRAPHY N/A 01/12/2022   Procedure: LEFT HEART CATH AND CORS/GRAFTS ANGIOGRAPHY;  Surgeon: Court Dorn PARAS, MD;  Location: [facility];  Service: Cardiovascular;  Laterality: N/A;   Social History   Occupational History   Not on file  Tobacco Use   Smoking status: Former    Current packs/day: 0.00    Average packs/day: 0.3 packs/day for 10.0 years (2.5 ttl  pk-yrs)    Types: Cigarettes    Start date: 11/25/1960    Quit date: 11/26/1970    Years since quitting: 53.6   Smokeless tobacco: Never  Vaping Use   Vaping status: Never Used  Substance and Sexual Activity   Alcohol use: No    Alcohol/week: 0.0 standard drinks of alcohol   Drug use: No   Sexual activity: Yes

## 2024-06-21 NOTE — Addendum Note (Signed)
 Addended by: Sylvania Moss on: 06/21/2024 11:25 AM   Modules accepted: Orders

## 2024-06-22 NOTE — Telephone Encounter (Signed)
 FYI Pt Form received and placed in Dr Johnny red folder.

## 2024-06-23 NOTE — Telephone Encounter (Signed)
 Pt form was successfully faxed to [facility]

## 2024-06-23 NOTE — Telephone Encounter (Signed)
 The form is ready

## 2024-07-03 ENCOUNTER — Telehealth: Payer: Self-pay

## 2024-07-03 NOTE — Telephone Encounter (Signed)
 Pt's wife called stating they haven't hear anything form imaging yet about the CT scan, I advised the wife that they can try calling the facility to schedule or they can wait until the facility reaches out to them.

## 2024-07-10 NOTE — Discharge Instructions (Signed)

## 2024-07-11 ENCOUNTER — Inpatient Hospital Stay
Admission: RE | Admit: 2024-07-11 | Discharge: 2024-07-11 | Attending: Orthopedic Surgery | Admitting: Orthopedic Surgery

## 2024-07-11 DIAGNOSIS — M5416 Radiculopathy, lumbar region: Secondary | ICD-10-CM

## 2024-07-11 MED ORDER — MEPERIDINE HCL 50 MG/ML IJ SOLN
50.0000 mg | Freq: Once | INTRAMUSCULAR | Status: AC | PRN
  Administered 2024-07-11: 75 mg via INTRAMUSCULAR

## 2024-07-11 MED ORDER — IOPAMIDOL (ISOVUE-M 200) INJECTION 41%
18.0000 mL | Freq: Once | INTRAMUSCULAR | Status: AC
  Administered 2024-07-11: 18 mL via INTRATHECAL

## 2024-07-11 MED ORDER — DIAZEPAM 5 MG PO TABS
5.0000 mg | ORAL_TABLET | Freq: Once | ORAL | Status: AC
  Administered 2024-07-11: 5 mg via ORAL

## 2024-07-11 MED ORDER — ONDANSETRON HCL 4 MG/2ML IJ SOLN: 4.0000 mg | Freq: Once | INTRAMUSCULAR | Status: DC | PRN

## 2024-07-14 ENCOUNTER — Ambulatory Visit: Admitting: Orthopedic Surgery

## 2024-07-14 ENCOUNTER — Encounter: Payer: Self-pay | Admitting: Orthopedic Surgery

## 2024-07-14 ENCOUNTER — Telehealth: Payer: Self-pay | Admitting: Orthopedic Surgery

## 2024-07-14 ENCOUNTER — Other Ambulatory Visit: Payer: Self-pay | Admitting: Orthopedic Surgery

## 2024-07-14 DIAGNOSIS — M1612 Unilateral primary osteoarthritis, left hip: Secondary | ICD-10-CM | POA: Diagnosis not present

## 2024-07-14 MED ORDER — TRAMADOL HCL 50 MG PO TABS: 50.0000 mg | ORAL_TABLET | Freq: Two times a day (BID) | ORAL | 0 refills | Status: DC | PRN

## 2024-07-14 NOTE — Telephone Encounter (Signed)
 Sent now

## 2024-07-14 NOTE — Telephone Encounter (Signed)
 Patient's wife called. Says the pain medication wasn't called in for patient.

## 2024-07-14 NOTE — Progress Notes (Signed)
 "  Office Visit Note   Patient: [name]           Date of Birth: 1950-11-27           MRN: [id number] Visit Date: 07/14/2024 Requested by: Johnny Garnette LABOR, MD [address] [place],  [place] [address] PCP: Johnny Garnette LABOR, MD  Subjective: Chief Complaint  Patient presents with   Other    Follow up to review scan    HPI: [name] is a 74 y.o. male who presents to the office reporting continued left hip pain.  Cannot get any rest.  Hard for him to walk.  He is using a cane.  Since he was last seen has had a CT myelogram of the lumbar spine.  That did show decrease in size on that left side at L4 fragment.  Reports a lot of buttock and groin pain.  Cannot put on his socks.  He is very active as an mining engineer.  He is on Plavix .  Describes night pain and rest pain.  Does have a history of neuropathy in his feet as well..                ROS: All systems reviewed are negative as they relate to the chief complaint within the history of present illness.  Patient denies fevers or chills.  Assessment & Plan: Visit Diagnoses: No diagnosis found.  Plan: Impression is end-stage left hip arthritis.  Patient also has some neuropathy as well as left-sided L4 foraminal stenosis which radiographically has improved compared to last study.  His best bet for relief would be left hip replacement.  The risk and benefits are discussed including but limited to infection or vessel damage incomplete pain relief as well as incomplete functional restoration instability and leg length inequality.  Patient understands the risk and benefits and wishes to proceed.  One-time prescription for tramadol  prescribed.  All questions answered.  Cardiac risk stratification in progress. Follow-Up Instructions: No follow-ups on file.   Orders:  No orders of the defined types were placed in this encounter.  No orders of the defined types were placed in this encounter.     Procedures: No procedures  performed   Clinical Data: No additional findings.  Objective: Vital Signs: There were no vitals taken for this visit.  Physical Exam:  Constitutional: Patient appears well-developed HEENT:  Head: Normocephalic Eyes:EOM are normal Neck: Normal range of motion Cardiovascular: Normal rate Pulmonary/chest: Effort normal Neurologic: Patient is alert Skin: Skin is warm Psychiatric: Patient has normal mood and affect  Ortho Exam: Ortho exam demonstrates 5 out of 5 ankle dorsiflexion plantarflexion quad and hamstring strength bilaterally.  Hip flexion strength is present but it is painful.  Does have a lot of pain with internal and external rotation of the hip.  Pedal pulses palpable.  Ankle dorsiflexion intact.  No definite paresthesias L1-S1 bilaterally but the patient does describe some pain radiating to the lateral calf region which is not coming from the hip.  Specialty Comments:  No specialty comments available.  Imaging: No results found.   PMFS History: Patient Active Problem List   Diagnosis Date Noted   Presence of drug coated stent in left circumflex coronary artery 05/14/2024   Chronic anemia 05/14/2024   Statin myopathy 05/14/2024   Pain in left hip 02/26/2022   Unstable angina (HCC) 01/10/2022   BPH with urinary obstruction 08/05/2021   Erectile dysfunction 08/05/2021   Type 2 diabetes mellitus without complication, without  long-term current use of insulin  (HCC) 08/04/2017   Osteoarthritis 04/28/2017   Insomnia 03/03/2017   Fatigue 05/28/2014   Anxiety state 06/09/2010   NECK PAIN 09/03/2009   LEG PAIN 07/16/2008   HEADACHE 07/16/2008   LOSS, HEARING NOS 05/03/2007   Hyperlipidemia with target low density lipoprotein (LDL) cholesterol less than 55 mg/dL 90/70/7991   Essential hypertension 04/04/2007   Coronary artery disease involving native coronary artery without angina pectoris 2002   Past Medical History:  Diagnosis Date   Chronic headache    Chronic  neck pain    Hyperlipidemia    Hypertension 12/26/2008   ECHO- EF>55%   Insomnia     Family History  Problem Relation Age of Onset   Heart disease Mother    Liver cancer Father    Cancer Brother    Cancer Brother    Cancer Brother    Coronary artery disease Other    Hypertension Other    Colon cancer Neg Hx    Stomach cancer Neg Hx    Esophageal cancer Neg Hx    Colon polyps Neg Hx    Rectal cancer Neg Hx     Past Surgical History:  Procedure Laterality Date   APPENDECTOMY     cervical spine injection     COLONOSCOPY  04-02-06    per [facility], clear, repeat in 10 yrs   CORONARY ARTERY BYPASS GRAFT  2002   4 vessels   CORONARY PRESSURE/FFR STUDY N/A 01/12/2022   Procedure: INTRAVASCULAR PRESSURE WIRE/FFR STUDY;  Surgeon: Court Dorn PARAS, MD;  Location: [facility];  Service: Cardiovascular;  Laterality: N/A;   CORONARY STENT INTERVENTION N/A 01/12/2022   Procedure: CORONARY STENT INTERVENTION;  Surgeon: Court Dorn PARAS, MD;  Location: [facility];  Service: Cardiovascular;  Laterality: N/A;   LEFT HEART CATH AND CORS/GRAFTS ANGIOGRAPHY N/A 01/12/2022   Procedure: LEFT HEART CATH AND CORS/GRAFTS ANGIOGRAPHY;  Surgeon: Court Dorn PARAS, MD;  Location: [facility];  Service: Cardiovascular;  Laterality: N/A;   Social History   Occupational History   Not on file  Tobacco Use   Smoking status: Former    Current packs/day: 0.00    Average packs/day: 0.3 packs/day for 10.0 years (2.5 ttl pk-yrs)    Types: Cigarettes    Start date: 11/25/1960    Quit date: 11/26/1970    Years since quitting: 53.6   Smokeless tobacco: Never  Vaping Use   Vaping status: Never Used  Substance and Sexual Activity   Alcohol use: No    Alcohol/week: 0.0 standard drinks of alcohol   Drug use: No   Sexual activity: Yes        "

## 2024-07-21 ENCOUNTER — Encounter (HOSPITAL_COMMUNITY): Payer: Self-pay

## 2024-07-21 NOTE — Progress Notes (Signed)
 Surgical Instructions   Your procedure is scheduled on Thursday, January 22nd, 2026. Report to [facility] Main Entrance A at 12:15 P.M., then check in with the Admitting office. Any questions or running late day of surgery: call [phone]  Questions prior to your surgery date: call [phone], Monday-Friday, 8am-4pm. If you experience any cold or flu symptoms such as cough, fever, chills, shortness of breath, etc. between now and your scheduled surgery, please notify us  at the above number.     Remember:  Do not eat after midnight the night before your surgery  You may drink clear liquids until 11:15 the morning of your surgery.   Clear liquids allowed are: Water , Non-Citrus Juices (without pulp), Carbonated Beverages, Clear Tea (no milk, honey, etc.), Black Coffee Only (NO MILK, CREAM OR POWDERED CREAMER of any kind), and Gatorade.  Patient Instructions  The night before surgery:  No food after midnight. ONLY clear liquids after midnight    The day of surgery (if you have diabetes): Drink ONE (1) 12 oz G2 given to you in your pre admission testing appointment by 11:15 the morning of surgery. Drink in one sitting. Do not sip.  This drink was given to you during your hospital  pre-op appointment visit.  Nothing else to drink after completing the  12 oz bottle of G2.         If you have questions, please contact your surgeons office.     Take these medicines the morning of surgery with A SIP OF WATER : Amlodipine  (Norvasc ) Gabapentin  (Neurontin ) Nebivolol  (Bystolic ) Pantoprazole  (Protonix ) Tamsulosin  (Flomax )   May take these medicines IF NEEDED: Tramadol  (Ultram )  Follow your surgeon's instructions on when to stop your Clopidogrel  (Plavix ).  If no instructions received, contact your surgeon's office.   One week prior to surgery, STOP taking any Aspirin  (unless otherwise instructed by your surgeon) Aleve, Naproxen, Ibuprofen, Motrin, Advil, Goody's, BC's, all  herbal medications, fish oil, and non-prescription vitamins.   WHAT DO I DO ABOUT MY DIABETES MEDICATION?   Dapagliflozin  Propanediol (Farxiga ) should be stopped 3 days prior to your surgery.  Your last dose should be on Sunday, January 18th.   The night before your procedure, do not take your evening dose of Glipizide  (Glucotrol ).  Do not take Glipizide  (Glucotrol ) on the morning of surgery.        Do not take Pioglitazone  (Actos ) on the morning of surgery.      HOW TO MANAGE YOUR DIABETES BEFORE AND AFTER SURGERY  Why is it important to control my blood sugar before and after surgery? Improving blood sugar levels before and after surgery helps healing and can limit problems. A way of improving blood sugar control is eating a healthy diet by:  Eating less sugar and carbohydrates  Increasing activity/exercise  Talking with your doctor about reaching your blood sugar goals High blood sugars (greater than 180 mg/dL) can raise your risk of infections and slow your recovery, so you will need to focus on controlling your diabetes during the weeks before surgery. Make sure that the doctor who takes care of your diabetes knows about your planned surgery including the date and location.  How do I manage my blood sugar before surgery? Check your blood sugar at least 4 times a day, starting 2 days before surgery, to make sure that the level is not too high or low.  Check your blood sugar the morning of your surgery when you wake up and every 2 hours until you get to  the Short Stay unit.  If your blood sugar is less than 70 mg/dL, you will need to treat for low blood sugar: Do not take insulin . Treat a low blood sugar (less than 70 mg/dL) with  cup of clear juice (cranberry or apple), 4 glucose tablets, OR glucose gel. Recheck blood sugar in 15 minutes after treatment (to make sure it is greater than 70 mg/dL). If your blood sugar is not greater than 70 mg/dL on recheck, call [phone]  for further instructions. Report your blood sugar to the short stay nurse when you get to Short Stay.  If you are admitted to the hospital after surgery: Your blood sugar will be checked by the staff and you will probably be given insulin  after surgery (instead of oral diabetes medicines) to make sure you have good blood sugar levels. The goal for blood sugar control after surgery is 80-180 mg/dL.                      Do NOT Smoke (Tobacco/Vaping) for 24 hours prior to your procedure.  If you use a CPAP at night, you may bring your mask/headgear for your overnight stay.   You will be asked to remove any contacts, glasses, piercing's, hearing aid's, dentures/partials prior to surgery. Please bring cases for these items if needed.    Your surgeon will determine if you are to be admitted or discharged the same day.  Patients discharged the day of surgery will not be allowed to drive home, and someone needs to stay with them for 24 hours.  SURGICAL WAITING ROOM VISITATION Patients may have no more than 2 support people in the waiting area - these visitors may rotate.   Pre-op nurse will coordinate an appropriate time for 2 ADULT support persons, who may not rotate, to accompany patient in pre-op.  Children under the age of 23 must have an adult with them who is not the patient and must remain in the main waiting area with an adult.  If the patient needs to stay at the hospital during part of their recovery, the visitor guidelines for inpatient rooms apply.  Please refer to the [facility] website for the visitor guidelines for any additional information.   If you received a COVID test during your pre-op visit  it is requested that you wear a mask when out in public, stay away from anyone that may not be feeling well and notify your surgeon if you develop symptoms. If you have been in contact with anyone that has tested positive in the last 10 days please notify you surgeon.       Pre-operative 4 CHG Bathing Instructions   You can play a key role in reducing the risk of infection after surgery. Your skin needs to be as free of germs as possible. You can reduce the number of germs on your skin by washing with CHG (chlorhexidine  gluconate) soap before surgery. CHG is an antiseptic soap that kills germs and continues to kill germs even after washing.   DO NOT use if you have an allergy to chlorhexidine /CHG or antibacterial soaps. If your skin becomes reddened or irritated, stop using the CHG and notify one of our RNs at [phone].   Please shower with the CHG soap starting 4 days before surgery using the following schedule:     Please keep in mind the following:  DO NOT shave, including legs and underarms, starting the day of your first shower.   You  may shave your face at any point before/day of surgery.  Place clean sheets on your bed the day you start using CHG soap. Use a clean washcloth (not used since being washed) for each shower. DO NOT sleep with pets once you start using the CHG.   CHG Shower Instructions:  Wash your face and private area with normal soap. If you choose to wash your hair, wash first with your normal shampoo.  After you use shampoo/soap, rinse your hair and body thoroughly to remove shampoo/soap residue.  Turn the water  OFF and apply  bottle of CHG soap to a CLEAN washcloth.  Apply CHG soap ONLY FROM YOUR NECK DOWN TO YOUR TOES (washing for 3-5 minutes)  DO NOT use CHG soap on face, private areas, open wounds, or sores.  Pay special attention to the area where your surgery is being performed.  If you are having back surgery, having someone wash your back for you may be helpful. Wait 2 minutes after CHG soap is applied, then you may rinse off the CHG soap.  Pat dry with a clean towel  Put on clean clothes/pajamas   If you choose to wear lotion, please use ONLY the CHG-compatible lotions that are listed below.  Additional  instructions for the day of surgery:  If you choose, you may shower the morning of surgery with an antibacterial soap.  DO NOT APPLY any lotions, deodorants, cologne, or perfumes.   Do not bring valuables to the hospital. [facility] is not responsible for any belongings/valuables. Do not wear nail polish, gel polish, artificial nails, or any other type of covering on natural nails (fingers and toes) Do not wear jewelry or makeup Put on clean/comfortable clothes.  Please brush your teeth.  Ask your nurse before applying any prescription medications to the skin.     CHG Compatible Lotions   Aveeno Moisturizing lotion  Cetaphil Moisturizing Cream  Cetaphil Moisturizing Lotion  Clairol Herbal Essence Moisturizing Lotion, Dry Skin  Clairol Herbal Essence Moisturizing Lotion, Extra Dry Skin  Clairol Herbal Essence Moisturizing Lotion, Normal Skin  Curel Age Defying Therapeutic Moisturizing Lotion with Alpha Hydroxy  Curel Extreme Care Body Lotion  Curel Soothing Hands Moisturizing Hand Lotion  Curel Therapeutic Moisturizing Cream, Fragrance-Free  Curel Therapeutic Moisturizing Lotion, Fragrance-Free  Curel Therapeutic Moisturizing Lotion, Original Formula  Eucerin Daily Replenishing Lotion  Eucerin Dry Skin Therapy Plus Alpha Hydroxy Crme  Eucerin Dry Skin Therapy Plus Alpha Hydroxy Lotion  Eucerin Original Crme  Eucerin Original Lotion  Eucerin Plus Crme Eucerin Plus Lotion  Eucerin TriLipid Replenishing Lotion  Keri Anti-Bacterial Hand Lotion  Keri Deep Conditioning Original Lotion Dry Skin Formula Softly Scented  Keri Deep Conditioning Original Lotion, Fragrance Free Sensitive Skin Formula  Keri Lotion Fast Absorbing Fragrance Free Sensitive Skin Formula  Keri Lotion Fast Absorbing Softly Scented Dry Skin Formula  Keri Original Lotion  Keri Skin Renewal Lotion Keri Silky Smooth Lotion  Keri Silky Smooth Sensitive Skin Lotion  Nivea Body Creamy Conditioning Oil  Nivea Body  Extra Enriched Lotion  Nivea Body Original Lotion  Nivea Body Sheer Moisturizing Lotion Nivea Crme  Nivea Skin Firming Lotion  NutraDerm 30 Skin Lotion  NutraDerm Skin Lotion  NutraDerm Therapeutic Skin Cream  NutraDerm Therapeutic Skin Lotion  ProShield Protective Hand Cream  Provon moisturizing lotion  Please read over the following fact sheets that you were given.

## 2024-07-24 ENCOUNTER — Encounter (HOSPITAL_COMMUNITY)
Admission: RE | Admit: 2024-07-24 | Discharge: 2024-07-24 | Disposition: A | Discharge status: Home / Self Care | Source: Ambulatory Visit | Attending: Orthopedic Surgery | Admitting: Orthopedic Surgery

## 2024-07-24 ENCOUNTER — Encounter (HOSPITAL_COMMUNITY): Payer: Self-pay

## 2024-07-24 ENCOUNTER — Other Ambulatory Visit: Payer: Self-pay

## 2024-07-24 VITALS — BP 139/75 | HR 63 | Temp 98.1°F | Resp 17 | Ht 69.0 in | Wt 208.1 lb

## 2024-07-24 DIAGNOSIS — Q2112 Patent foramen ovale: Secondary | ICD-10-CM | POA: Insufficient documentation

## 2024-07-24 DIAGNOSIS — Z6831 Body mass index (BMI) 31.0-31.9, adult: Secondary | ICD-10-CM | POA: Insufficient documentation

## 2024-07-24 DIAGNOSIS — E119 Type 2 diabetes mellitus without complications: Secondary | ICD-10-CM | POA: Insufficient documentation

## 2024-07-24 DIAGNOSIS — I080 Rheumatic disorders of both mitral and aortic valves: Secondary | ICD-10-CM | POA: Insufficient documentation

## 2024-07-24 DIAGNOSIS — I10 Essential (primary) hypertension: Secondary | ICD-10-CM | POA: Diagnosis not present

## 2024-07-24 DIAGNOSIS — Z01812 Encounter for preprocedural laboratory examination: Secondary | ICD-10-CM | POA: Diagnosis present

## 2024-07-24 DIAGNOSIS — E669 Obesity, unspecified: Secondary | ICD-10-CM | POA: Insufficient documentation

## 2024-07-24 DIAGNOSIS — Z955 Presence of coronary angioplasty implant and graft: Secondary | ICD-10-CM | POA: Insufficient documentation

## 2024-07-24 DIAGNOSIS — Z01818 Encounter for other preprocedural examination: Secondary | ICD-10-CM

## 2024-07-24 DIAGNOSIS — Z951 Presence of aortocoronary bypass graft: Secondary | ICD-10-CM | POA: Diagnosis not present

## 2024-07-24 DIAGNOSIS — I2510 Atherosclerotic heart disease of native coronary artery without angina pectoris: Secondary | ICD-10-CM | POA: Diagnosis not present

## 2024-07-24 HISTORY — DX: Anemia, unspecified: D64.9

## 2024-07-24 HISTORY — DX: Restless legs syndrome: G25.81

## 2024-07-24 HISTORY — DX: Type 2 diabetes mellitus without complications: E11.9

## 2024-07-24 HISTORY — DX: Atherosclerotic heart disease of native coronary artery without angina pectoris: I25.10

## 2024-07-24 HISTORY — DX: Unspecified osteoarthritis, unspecified site: M19.90

## 2024-07-24 HISTORY — DX: Angina pectoris, unspecified: I20.9

## 2024-07-24 HISTORY — DX: Diverticulosis of intestine, part unspecified, without perforation or abscess without bleeding: K57.90

## 2024-07-24 LAB — TYPE AND SCREEN
ABO/RH(D): O POS
Antibody Screen: NEGATIVE

## 2024-07-24 LAB — CBC
HCT: 44.9 % (ref 39.0–52.0)
Hemoglobin: 15.6 g/dL (ref 13.0–17.0)
MCH: 32.8 pg (ref 26.0–34.0)
MCHC: 34.7 g/dL (ref 30.0–36.0)
MCV: 94.3 fL (ref 80.0–100.0)
Platelets: 187 K/uL (ref 150–400)
RBC: 4.76 MIL/uL (ref 4.22–5.81)
RDW: 12.3 % (ref 11.5–15.5)
WBC: 6.2 K/uL (ref 4.0–10.5)
nRBC: 0 % (ref 0.0–0.2)

## 2024-07-24 LAB — SURGICAL PCR SCREEN
MRSA, PCR: NEGATIVE
Staphylococcus aureus: NEGATIVE

## 2024-07-24 LAB — URINALYSIS, W/ REFLEX TO CULTURE (INFECTION SUSPECTED)
Bacteria, UA: NONE SEEN
Bilirubin Urine: NEGATIVE
Glucose, UA: >=500 mg/dL — AB
Hgb urine dipstick: NEGATIVE
Ketones, ur: NEGATIVE mg/dL
Leukocytes,Ua: NEGATIVE
Nitrite: NEGATIVE
Protein, ur: NEGATIVE mg/dL
Specific Gravity, Urine: 1.029 (ref 1.005–1.030)
pH: 5 (ref 5.0–8.0)

## 2024-07-24 LAB — BASIC METABOLIC PANEL WITH GFR
Anion gap: 10 (ref 5–15)
BUN: 13 mg/dL (ref 8–23)
CO2: 28 mmol/L (ref 22–32)
Calcium: 9.7 mg/dL (ref 8.9–10.3)
Chloride: 104 mmol/L (ref 98–111)
Creatinine, Ser: 0.86 mg/dL (ref 0.61–1.24)
GFR, Estimated: >60 mL/min
Glucose, Bld: 150 mg/dL — ABNORMAL HIGH (ref 70–99)
Potassium: 3.7 mmol/L (ref 3.5–5.1)
Sodium: 142 mmol/L (ref 135–145)

## 2024-07-24 LAB — HEMOGLOBIN A1C
Hgb A1c MFr Bld: 8.3 % — ABNORMAL HIGH (ref 4.8–5.6)
Mean Plasma Glucose: 191.51 mg/dL

## 2024-07-24 LAB — GLUCOSE, CAPILLARY: Glucose-Capillary: 150 mg/dL — ABNORMAL HIGH (ref 70–99)

## 2024-07-24 NOTE — Progress Notes (Signed)
 Carlin Calix, PA-C with Dr. Brion office, made aware of A1c result of 8.3. PA will notify surgeon.

## 2024-07-24 NOTE — Progress Notes (Signed)
 PCP - Dr. Garnette [place] Cardiologist - Dr. Alm Clay - clearance 05/09/24  PPM/ICD - denies Device Orders - n/a Rep Notified - n/a  Chest x-ray - denies EKG - 05/09/24 Stress Test - 10/23/21 ECHO - 09/21/23 Cardiac Cath - 01/12/22  Sleep Study - n/a CPAP - n/a  Type II DM but patient does not check blood sugar at home.   Last dose of GLP1 agonist-  n/a GLP1 instructions: n/a  Farxiga  - last dose was 1/18  Blood Thinner Instructions: last dose of Plavix  was 1/16 - patient states he was instructed by cardiologist to hold for 7 days prior to surgery Aspirin  Instructions: n/a  ERAS Protcol - clears until 1115 PRE-SURGERY Ensure or G2- G2 as ordered  COVID TEST- n/a   Anesthesia review: yes - cardiac history   Patient denies shortness of breath, fever, cough and chest pain at PAT appointment   All instructions explained to the patient, with a verbal understanding of the material. Patient agrees to go over the instructions while at home for a better understanding. Patient also instructed to self quarantine after being tested for COVID-19. The opportunity to ask questions was provided.

## 2024-07-25 ENCOUNTER — Ambulatory Visit: Payer: Self-pay | Admitting: Orthopedic Surgery

## 2024-07-25 ENCOUNTER — Telehealth: Payer: Self-pay | Admitting: *Deleted

## 2024-07-25 NOTE — Anesthesia Preprocedure Evaluation (Signed)
 "                                  Anesthesia Evaluation  Patient identified by MRN, date of birth, ID band Patient awake    Reviewed: Allergy & Precautions, NPO status , Patient's Chart, lab work & pertinent test results  History of Anesthesia Complications Negative for: history of anesthetic complications  Airway Mallampati: III  TM Distance: >3 FB Neck ROM: Full    Dental  (+) Edentulous Upper, Partial Lower, Dental Advisory Given   Pulmonary neg shortness of breath, neg COPD, neg recent URI, former smoker   breath sounds clear to auscultation       Cardiovascular hypertension, Pt. on medications (-) angina + CAD, + Cardiac Stents and + CABG   Rhythm:Regular  1. Left ventricular ejection fraction, by estimation, is 55 to 60%. The  left ventricle has normal function. The left ventricle demonstrates  regional wall motion abnormalities (see scoring diagram/findings for  description). There is mild concentric left  ventricular hypertrophy. Left ventricular diastolic parameters are  consistent with Grade II diastolic dysfunction (pseudonormalization). The  E/e' is 9.5.   2. Right ventricular systolic function is normal. The right ventricular  size is mildly enlarged. The estimated right ventricular systolic pressure  is 19.0 mmHg.   3. Left atrial size was severely dilated.   4. Right atrial size was mildly dilated.   5. The mitral valve is normal in structure. Mild to moderate mitral valve  regurgitation.   6. The aortic valve is tricuspid. There is mild thickening of the aortic  valve. Aortic valve regurgitation is not visualized. Aortic valve  sclerosis is present, with no evidence of aortic valve stenosis.   7. The inferior vena cava is normal in size with greater than 50%  respiratory variability, suggesting right atrial pressure of 3 mmHg.   8. Evidence of atrial level shunting detected by color flow Doppler.  There is a small patent foramen ovale with  predominantly left to right  shunting across the atrial septum.     Ost Cx to Prox Cx lesion is 40% stenosed.   Mid Cx lesion is 75% stenosed.   3rd Mrg lesion is 50% stenosed.   Dist RCA lesion is 100% stenosed with 100% stenosed side branch in RPAV.   RPDA lesion is 100% stenosed.   Mid LAD lesion is 100% stenosed.   A drug-eluting stent was successfully placed.   Post intervention, there is a 0% residual stenosis.     Neuro/Psych  Headaches PSYCHIATRIC DISORDERS Anxiety      Neuromuscular disease    GI/Hepatic Neg liver ROS,GERD  Medicated and Controlled,,  Endo/Other  diabetes, Type 2    Renal/GU negative Renal ROS     Musculoskeletal  (+) Arthritis ,    Abdominal   Peds  Hematology Lab Results      Component                Value               Date                      WBC                      6.2                 07/24/2024  HGB                      15.6                07/24/2024                HCT                      44.9                07/24/2024                MCV                      94.3                07/24/2024                PLT                      187                 07/24/2024           Plavix  last does 1/16   Anesthesia Other Findings   Reproductive/Obstetrics                              Anesthesia Physical Anesthesia Plan  ASA: 3  Anesthesia Plan: General   Post-op Pain Management: Tylenol  PO (pre-op)*, Toradol  IV (intra-op)* and Ketamine  IV*   Induction: Intravenous  PONV Risk Score and Plan: 3 and Ondansetron  and Dexamethasone   Airway Management Planned: Oral ETT  Additional Equipment: None  Intra-op Plan:   Post-operative Plan: Extubation in OR  Informed Consent: I have reviewed the patients History and Physical, chart, labs and discussed the procedure including the risks, benefits and alternatives for the proposed anesthesia with the patient or authorized representative who has  indicated his/her understanding and acceptance.     Dental advisory given  Plan Discussed with: CRNA  Anesthesia Plan Comments: (PAT note by Lynwood Hope, PA-C: 74 year old male follows with cardiology for history of CAD s/p CABG 2002 and DES to LCx 01/2022, diastolic dysfunction, HTN, HLD.  Echo 09/2023 showed LVEF 55 to 60%, normal wall motion, grade 2 DD, normal RV systolic function, mildly enlarged RV (RVSP 19 mmHg) severely dilated LA, mild to moderate mitral regurgitation.  Seen by Dr. Anner 05/09/2024 for preop evaluation.Per note, Mr. Hume perioperative risk of a major cardiac event is 0.4% according to the Revised Cardiac Risk Index (RCRI).  Therefore, he is at low risk for perioperative complications.   His functional capacity is good to excellent for age-limited more by MSK pain than any cardiac symptoms at 7.99 METs according to the [facility] Activity Status Index (DASI). Recommendations: According to ACC/AHA guidelines, no further cardiovascular testing needed.  The patient may proceed to surgery at acceptable risk. Antiplatelet and/or Anticoagulation Recommendations: Clopidogrel  (Plavix ) can be held for 5-7 days prior to his surgery and resumed as soon as possible post op. He will not be on aspirin . Not on DOAC.  Patient reports last dose Plavix  07/21/2024.  Other pertinent history includes chronic headaches, non-insulin -dependent DM2, obesity BMI 31.  Preop labs reviewed, unremarkable.  DM2 not well-controlled with A1c 8.3.  EKG 05/09/2024: NSR.  Rate 64.  Possible inferior infarct, age-indeterminate.  ST and T wave abnormality,  consider anterolateral ischemia.  TTE 09/21/2023:  1. Left ventricular ejection fraction, by estimation, is 55 to 60%. The  left ventricle has normal function. The left ventricle demonstrates  regional wall motion abnormalities (see scoring diagram/findings for  description). There is mild concentric left  ventricular hypertrophy. Left ventricular  diastolic parameters are  consistent with Grade II diastolic dysfunction (pseudonormalization). The  E/e' is 9.5.   2. Right ventricular systolic function is normal. The right ventricular  size is mildly enlarged. The estimated right ventricular systolic pressure  is 19.0 mmHg.   3. Left atrial size was severely dilated.   4. Right atrial size was mildly dilated.   5. The mitral valve is normal in structure. Mild to moderate mitral valve  regurgitation.   6. The aortic valve is tricuspid. There is mild thickening of the aortic  valve. Aortic valve regurgitation is not visualized. Aortic valve  sclerosis is present, with no evidence of aortic valve stenosis.   7. The inferior vena cava is normal in size with greater than 50%  respiratory variability, suggesting right atrial pressure of 3 mmHg.   8. Evidence of atrial level shunting detected by color flow Doppler.  There is a small patent foramen ovale with predominantly left to right  shunting across the atrial septum.   Comparison(s): No significant change from prior study. Prior images  reviewed side by side. By retrospective review, the basal inferior and  inferoseptal hypokinesis was also present on the 2023 study.   Cath/PCI 01/12/2022:   Ost Cx to Prox Cx lesion is 40% stenosed.   Mid Cx lesion is 75% stenosed.   3rd Mrg lesion is 50% stenosed.   Dist RCA lesion is 100% stenosed with 100% stenosed side branch in RPAV.   RPDA lesion is 100% stenosed.   Mid LAD lesion is 100% stenosed.   A drug-eluting stent was successfully placed.   Post intervention, there is a 0% residual stenosis.    )         Anesthesia Quick Evaluation  "

## 2024-07-25 NOTE — Telephone Encounter (Unsigned)
 Copied from CRM [id number]. Topic: Clinical - Prescription Issue >> Jul 25, 2024  1:18 PM Burnard DEL wrote: Reason for CRM: Patients wife called in stating that they are now being charged 1200 dollars for Evolocumab  (REPATHA  SURECLICK) 140 MG/ML SOAJ medication. She stated that they cannot afford the and would like to know is there any coupons or any kind of assistance that can be used to assist in paying for this because it does help her husband a lot. Please.

## 2024-07-25 NOTE — Progress Notes (Signed)
 Anesthesia Chart Review:  74 year old male follows with cardiology for history of CAD s/p CABG 2002 and DES to LCx 01/2022, diastolic dysfunction, HTN, HLD.  Echo 09/2023 showed LVEF 55 to 60%, normal wall motion, grade 2 DD, normal RV systolic function, mildly enlarged RV (RVSP 19 mmHg) severely dilated LA, mild to moderate mitral regurgitation.  Seen by Dr. Anner 05/09/2024 for preop evaluation.Per note, [name] perioperative risk of a major cardiac event is 0.4% according to the Revised Cardiac Risk Index (RCRI).  Therefore, he is at low risk for perioperative complications.   His functional capacity is good to excellent for age-limited more by MSK pain than any cardiac symptoms at 7.99 METs according to the [facility] Activity Status Index (DASI). Recommendations: According to ACC/AHA guidelines, no further cardiovascular testing needed.  The patient may proceed to surgery at acceptable risk. Antiplatelet and/or Anticoagulation Recommendations: Clopidogrel  (Plavix ) can be held for 5-7 days prior to his surgery and resumed as soon as possible post op. He will not be on aspirin . Not on DOAC.  Patient reports last dose Plavix  07/21/2024.  Other pertinent history includes chronic headaches, non-insulin -dependent DM2, obesity BMI 31.  Preop labs reviewed, unremarkable.  DM2 not well-controlled with A1c 8.3.  EKG 05/09/2024: NSR.  Rate 64.  Possible inferior infarct, age-indeterminate.  ST and T wave abnormality, consider anterolateral ischemia.  TTE 09/21/2023:  1. Left ventricular ejection fraction, by estimation, is 55 to 60%. The  left ventricle has normal function. The left ventricle demonstrates  regional wall motion abnormalities (see scoring diagram/findings for  description). There is mild concentric left  ventricular hypertrophy. Left ventricular diastolic parameters are  consistent with Grade II diastolic dysfunction (pseudonormalization). The  E/e' is 9.5.   2. Right ventricular systolic  function is normal. The right ventricular  size is mildly enlarged. The estimated right ventricular systolic pressure  is 19.0 mmHg.   3. Left atrial size was severely dilated.   4. Right atrial size was mildly dilated.   5. The mitral valve is normal in structure. Mild to moderate mitral valve  regurgitation.   6. The aortic valve is tricuspid. There is mild thickening of the aortic  valve. Aortic valve regurgitation is not visualized. Aortic valve  sclerosis is present, with no evidence of aortic valve stenosis.   7. The inferior vena cava is normal in size with greater than 50%  respiratory variability, suggesting right atrial pressure of 3 mmHg.   8. Evidence of atrial level shunting detected by color flow Doppler.  There is a small patent foramen ovale with predominantly left to right  shunting across the atrial septum.   Comparison(s): No significant change from prior study. Prior images  reviewed side by side. By retrospective review, the basal inferior and  inferoseptal hypokinesis was also present on the 2023 study.   Cath/PCI 01/12/2022:   Ost Cx to Prox Cx lesion is 40% stenosed.   Mid Cx lesion is 75% stenosed.   3rd Mrg lesion is 50% stenosed.   Dist RCA lesion is 100% stenosed with 100% stenosed side branch in RPAV.   RPDA lesion is 100% stenosed.   Mid LAD lesion is 100% stenosed.   A drug-eluting stent was successfully placed.   Post intervention, there is a 0% residual stenosis.     [name] [facility] Short Stay Center/Anesthesiology Phone [phone] 07/25/2024 3:53 PM

## 2024-07-25 NOTE — Progress Notes (Signed)
 I talked to [facility] and told him he was at marginally increased risk for infection with his A1c being 8.3 but his level of pain is such that he has to get something done and he is okay with that slightly higher risk instead of waiting another month or 2 deferred to go under 8

## 2024-07-26 NOTE — Telephone Encounter (Signed)
 Patient and spouse returned call. Discussed cost concerns with Repatha .  Enrolled patient in the [facility], which should lower cost of Repatha  to a $0 copay. Provided copay card info to the patient via mychart message, they should also receive a card in the mail.   Advised to reach back out if any issues arise.  [name] VEAR Lindau, PharmD Clinical Pharmacist [phone]

## 2024-07-26 NOTE — Telephone Encounter (Signed)
 Attempted to return call to discuss potential cost assistance for repatha . Had to leave a voicemail

## 2024-07-26 NOTE — Telephone Encounter (Signed)
 I will ask [name] if she can assist him with this

## 2024-07-27 ENCOUNTER — Encounter (HOSPITAL_COMMUNITY): Payer: Self-pay | Admitting: Orthopedic Surgery

## 2024-07-27 ENCOUNTER — Observation Stay (HOSPITAL_COMMUNITY)

## 2024-07-27 ENCOUNTER — Encounter (HOSPITAL_COMMUNITY): Payer: Self-pay | Admitting: Physician Assistant

## 2024-07-27 ENCOUNTER — Ambulatory Visit (HOSPITAL_COMMUNITY): Admitting: Certified Registered Nurse Anesthetist

## 2024-07-27 ENCOUNTER — Ambulatory Visit (HOSPITAL_COMMUNITY)

## 2024-07-27 ENCOUNTER — Observation Stay (HOSPITAL_COMMUNITY)
Admission: RE | Admit: 2024-07-27 | Discharge: 2024-07-28 | Disposition: A | Discharge status: Home / Self Care | Attending: Orthopedic Surgery | Admitting: Orthopedic Surgery

## 2024-07-27 ENCOUNTER — Encounter (HOSPITAL_COMMUNITY)
Admission: RE | Disposition: A | Discharge status: Home / Self Care | Payer: Self-pay | Source: Home / Self Care | Attending: Orthopedic Surgery

## 2024-07-27 ENCOUNTER — Other Ambulatory Visit: Payer: Self-pay

## 2024-07-27 DIAGNOSIS — M5432 Sciatica, left side: Secondary | ICD-10-CM

## 2024-07-27 DIAGNOSIS — I25110 Atherosclerotic heart disease of native coronary artery with unstable angina pectoris: Secondary | ICD-10-CM | POA: Insufficient documentation

## 2024-07-27 DIAGNOSIS — M1612 Unilateral primary osteoarthritis, left hip: Secondary | ICD-10-CM

## 2024-07-27 DIAGNOSIS — I2510 Atherosclerotic heart disease of native coronary artery without angina pectoris: Secondary | ICD-10-CM | POA: Diagnosis not present

## 2024-07-27 DIAGNOSIS — E119 Type 2 diabetes mellitus without complications: Secondary | ICD-10-CM | POA: Diagnosis not present

## 2024-07-27 DIAGNOSIS — Z87891 Personal history of nicotine dependence: Secondary | ICD-10-CM | POA: Diagnosis not present

## 2024-07-27 DIAGNOSIS — Z79899 Other long term (current) drug therapy: Secondary | ICD-10-CM | POA: Diagnosis not present

## 2024-07-27 DIAGNOSIS — Z96652 Presence of left artificial knee joint: Secondary | ICD-10-CM

## 2024-07-27 DIAGNOSIS — M25552 Pain in left hip: Secondary | ICD-10-CM | POA: Diagnosis present

## 2024-07-27 DIAGNOSIS — Z7985 Long-term (current) use of injectable non-insulin antidiabetic drugs: Secondary | ICD-10-CM | POA: Insufficient documentation

## 2024-07-27 DIAGNOSIS — I10 Essential (primary) hypertension: Secondary | ICD-10-CM | POA: Insufficient documentation

## 2024-07-27 DIAGNOSIS — Z01818 Encounter for other preprocedural examination: Principal | ICD-10-CM

## 2024-07-27 DIAGNOSIS — Z955 Presence of coronary angioplasty implant and graft: Secondary | ICD-10-CM | POA: Diagnosis not present

## 2024-07-27 LAB — GLUCOSE, CAPILLARY
Glucose-Capillary: 142 mg/dL — ABNORMAL HIGH (ref 70–99)
Glucose-Capillary: 113 mg/dL — ABNORMAL HIGH (ref 70–99)
Glucose-Capillary: 179 mg/dL — ABNORMAL HIGH (ref 70–99)

## 2024-07-27 MED ORDER — INSULIN ASPART 100 UNIT/ML IJ SOLN: 0.0000 [IU] | INTRAMUSCULAR | Status: DC | PRN

## 2024-07-27 MED ORDER — POVIDONE-IODINE 10 % EX SWAB
2.0000 | Freq: Once | CUTANEOUS | Status: AC
  Administered 2024-07-27: 2 via TOPICAL

## 2024-07-27 MED ORDER — NOREPINEPHRINE 4 MG/250ML-% IV SOLN
INTRAVENOUS | Status: DC | PRN
  Administered 2024-07-27: 2 ug/min via INTRAVENOUS

## 2024-07-27 MED ORDER — ONDANSETRON HCL 4 MG/2ML IJ SOLN
INTRAMUSCULAR | Status: DC | PRN
  Administered 2024-07-27: 4 mg via INTRAVENOUS

## 2024-07-27 MED ORDER — POVIDONE-IODINE 7.5 % EX SOLN
Freq: Once | CUTANEOUS | Status: DC
  Filled 2024-07-27: qty 118

## 2024-07-27 MED ORDER — MENTHOL 3 MG MT LOZG: 1.0000 | LOZENGE | OROMUCOSAL | Status: DC | PRN

## 2024-07-27 MED ORDER — DEXAMETHASONE SOD PHOSPHATE PF 10 MG/ML IJ SOLN
INTRAMUSCULAR | Status: DC | PRN
  Administered 2024-07-27: 5 mg via INTRAVENOUS

## 2024-07-27 MED ORDER — SODIUM CHLORIDE 0.9 % IV SOLN: Freq: Once | INTRAVENOUS | Status: AC

## 2024-07-27 MED ORDER — SODIUM CHLORIDE 0.9 % IR SOLN
Status: DC | PRN
  Administered 2024-07-27: 3000 mL

## 2024-07-27 MED ORDER — KETAMINE HCL 50 MG/5ML IJ SOSY
PREFILLED_SYRINGE | INTRAMUSCULAR | Status: AC
  Filled 2024-07-27: qty 5

## 2024-07-27 MED ORDER — HYDRALAZINE HCL 20 MG/ML IJ SOLN
INTRAMUSCULAR | Status: DC | PRN
  Administered 2024-07-27 (×2): 5 mg via INTRAVENOUS

## 2024-07-27 MED ORDER — ASPIRIN 81 MG PO TBEC
81.0000 mg | DELAYED_RELEASE_TABLET | Freq: Two times a day (BID) | ORAL | Status: DC
  Administered 2024-07-27 – 2024-07-28 (×2): 81 mg via ORAL
  Filled 2024-07-27 (×2): qty 1

## 2024-07-27 MED ORDER — VANCOMYCIN HCL 1000 MG IV SOLR
INTRAVENOUS | Status: DC | PRN
  Administered 2024-07-27: 1000 mg via TOPICAL

## 2024-07-27 MED ORDER — ORAL CARE MOUTH RINSE: 15.0000 mL | Freq: Once | OROMUCOSAL | Status: DC

## 2024-07-27 MED ORDER — ACETAMINOPHEN 325 MG PO TABS: 325.0000 mg | ORAL_TABLET | Freq: Four times a day (QID) | ORAL | Status: DC | PRN

## 2024-07-27 MED ORDER — TAMSULOSIN HCL 0.4 MG PO CAPS
0.4000 mg | ORAL_CAPSULE | Freq: Every day | ORAL | Status: DC
  Administered 2024-07-27 – 2024-07-28 (×2): 0.4 mg via ORAL
  Filled 2024-07-27 (×2): qty 1

## 2024-07-27 MED ORDER — FENTANYL CITRATE (PF) 100 MCG/2ML IJ SOLN
INTRAMUSCULAR | Status: AC
  Filled 2024-07-27: qty 2

## 2024-07-27 MED ORDER — PANTOPRAZOLE SODIUM 40 MG PO TBEC
40.0000 mg | DELAYED_RELEASE_TABLET | Freq: Two times a day (BID) | ORAL | Status: DC
  Administered 2024-07-27 – 2024-07-28 (×2): 40 mg via ORAL
  Filled 2024-07-27 (×2): qty 1

## 2024-07-27 MED ORDER — SUGAMMADEX SODIUM 200 MG/2ML IV SOLN
INTRAVENOUS | Status: DC | PRN
  Administered 2024-07-27: 188.8 mg via INTRAVENOUS

## 2024-07-27 MED ORDER — PHENOL 1.4 % MT LIQD: 1.0000 | OROMUCOSAL | Status: DC | PRN

## 2024-07-27 MED ORDER — OXYCODONE HCL 5 MG PO TABS
5.0000 mg | ORAL_TABLET | ORAL | Status: DC | PRN
  Administered 2024-07-28: 5 mg via ORAL
  Filled 2024-07-27: qty 1

## 2024-07-27 MED ORDER — CEFAZOLIN SODIUM-DEXTROSE 2-4 GM/100ML-% IV SOLN
2.0000 g | Freq: Three times a day (TID) | INTRAVENOUS | Status: AC
  Administered 2024-07-27 – 2024-07-28 (×2): 2 g via INTRAVENOUS
  Filled 2024-07-27 (×2): qty 100

## 2024-07-27 MED ORDER — BUPIVACAINE HCL (PF) 0.25 % IJ SOLN
INTRAMUSCULAR | Status: DC | PRN
  Administered 2024-07-27: 10 mL
  Administered 2024-07-27: 20 mL

## 2024-07-27 MED ORDER — LIDOCAINE 2% (20 MG/ML) 5 ML SYRINGE
INTRAMUSCULAR | Status: AC
  Filled 2024-07-27: qty 5

## 2024-07-27 MED ORDER — MORPHINE SULFATE (PF) 4 MG/ML IV SOLN
INTRAVENOUS | Status: AC
  Filled 2024-07-27: qty 2

## 2024-07-27 MED ORDER — PHENYLEPHRINE 80 MCG/ML (10ML) SYRINGE FOR IV PUSH (FOR BLOOD PRESSURE SUPPORT)
PREFILLED_SYRINGE | INTRAVENOUS | Status: DC | PRN
  Administered 2024-07-27: 80 ug via INTRAVENOUS
  Administered 2024-07-27: 160 ug via INTRAVENOUS

## 2024-07-27 MED ORDER — HYDROMORPHONE HCL 1 MG/ML IJ SOLN
INTRAMUSCULAR | Status: AC
  Filled 2024-07-27: qty 1

## 2024-07-27 MED ORDER — HYDROMORPHONE HCL 1 MG/ML IJ SOLN
0.2500 mg | INTRAMUSCULAR | Status: DC | PRN
  Administered 2024-07-27 (×3): 0.25 mg via INTRAVENOUS
  Administered 2024-07-27: 0.5 mg via INTRAVENOUS
  Administered 2024-07-27: 0.25 mg via INTRAVENOUS
  Administered 2024-07-27: 0.5 mg via INTRAVENOUS

## 2024-07-27 MED ORDER — PROPOFOL 10 MG/ML IV BOLUS
INTRAVENOUS | Status: AC
  Filled 2024-07-27: qty 20

## 2024-07-27 MED ORDER — TRAZODONE HCL 100 MG PO TABS
100.0000 mg | ORAL_TABLET | Freq: Every day | ORAL | Status: DC
  Administered 2024-07-27: 100 mg via ORAL
  Filled 2024-07-27 (×2): qty 1

## 2024-07-27 MED ORDER — DOCUSATE SODIUM 100 MG PO CAPS
100.0000 mg | ORAL_CAPSULE | Freq: Two times a day (BID) | ORAL | Status: DC
  Administered 2024-07-27 – 2024-07-28 (×2): 100 mg via ORAL
  Filled 2024-07-27 (×2): qty 1

## 2024-07-27 MED ORDER — PIOGLITAZONE HCL 30 MG PO TABS
45.0000 mg | ORAL_TABLET | Freq: Every day | ORAL | Status: DC
  Administered 2024-07-28: 45 mg via ORAL
  Filled 2024-07-27: qty 1

## 2024-07-27 MED ORDER — POVIDONE-IODINE 10 % EX SWAB: 2.0000 | Freq: Once | CUTANEOUS | Status: DC

## 2024-07-27 MED ORDER — DEXMEDETOMIDINE HCL IN NACL 80 MCG/20ML IV SOLN
INTRAVENOUS | Status: DC | PRN
  Administered 2024-07-27: 10 ug via INTRAVENOUS

## 2024-07-27 MED ORDER — ACETAMINOPHEN 10 MG/ML IV SOLN: 1000.0000 mg | Freq: Once | INTRAVENOUS | Status: DC | PRN

## 2024-07-27 MED ORDER — AMLODIPINE BESYLATE 5 MG PO TABS
5.0000 mg | ORAL_TABLET | Freq: Every day | ORAL | Status: DC
  Administered 2024-07-28: 5 mg via ORAL
  Filled 2024-07-27: qty 1

## 2024-07-27 MED ORDER — ACETAMINOPHEN 500 MG PO TABS
1000.0000 mg | ORAL_TABLET | Freq: Four times a day (QID) | ORAL | Status: DC
  Administered 2024-07-28 (×3): 1000 mg via ORAL
  Filled 2024-07-27 (×3): qty 2

## 2024-07-27 MED ORDER — NEBIVOLOL HCL 2.5 MG PO TABS
2.5000 mg | ORAL_TABLET | Freq: Every day | ORAL | Status: DC
  Administered 2024-07-27 – 2024-07-28 (×2): 2.5 mg via ORAL
  Filled 2024-07-27 (×2): qty 1

## 2024-07-27 MED ORDER — CEFAZOLIN SODIUM-DEXTROSE 2-4 GM/100ML-% IV SOLN
2.0000 g | INTRAVENOUS | Status: AC
  Administered 2024-07-27: 2 g via INTRAVENOUS
  Filled 2024-07-27: qty 100

## 2024-07-27 MED ORDER — DROPERIDOL 2.5 MG/ML IJ SOLN
INTRAMUSCULAR | Status: AC
  Filled 2024-07-27: qty 2

## 2024-07-27 MED ORDER — METOCLOPRAMIDE HCL 5 MG/ML IJ SOLN: 5.0000 mg | Freq: Three times a day (TID) | INTRAMUSCULAR | Status: DC | PRN

## 2024-07-27 MED ORDER — METHOCARBAMOL 1000 MG/10ML IJ SOLN: 500.0000 mg | Freq: Four times a day (QID) | INTRAMUSCULAR | Status: DC | PRN

## 2024-07-27 MED ORDER — VANCOMYCIN HCL 1000 MG IV SOLR
INTRAVENOUS | Status: AC
  Filled 2024-07-27: qty 20

## 2024-07-27 MED ORDER — KETOROLAC TROMETHAMINE 30 MG/ML IJ SOLN
INTRAMUSCULAR | Status: AC
  Filled 2024-07-27: qty 1

## 2024-07-27 MED ORDER — CHLORHEXIDINE GLUCONATE 0.12 % MT SOLN
15.0000 mL | Freq: Once | OROMUCOSAL | Status: DC
  Filled 2024-07-27: qty 15

## 2024-07-27 MED ORDER — LACTATED RINGERS IV SOLN: INTRAVENOUS | Status: DC

## 2024-07-27 MED ORDER — ONDANSETRON HCL 4 MG/2ML IJ SOLN
4.0000 mg | Freq: Four times a day (QID) | INTRAMUSCULAR | Status: DC | PRN
  Administered 2024-07-28: 4 mg via INTRAVENOUS
  Filled 2024-07-27: qty 2

## 2024-07-27 MED ORDER — CLOPIDOGREL BISULFATE 75 MG PO TABS
75.0000 mg | ORAL_TABLET | Freq: Every day | ORAL | Status: DC
  Administered 2024-07-28: 75 mg via ORAL
  Filled 2024-07-27: qty 1

## 2024-07-27 MED ORDER — OXYCODONE HCL 5 MG PO TABS
5.0000 mg | ORAL_TABLET | Freq: Once | ORAL | Status: AC | PRN
  Administered 2024-07-27: 5 mg via ORAL

## 2024-07-27 MED ORDER — ROCURONIUM BROMIDE 10 MG/ML (PF) SYRINGE
PREFILLED_SYRINGE | INTRAVENOUS | Status: AC
  Filled 2024-07-27: qty 10

## 2024-07-27 MED ORDER — HYDROMORPHONE HCL 1 MG/ML IJ SOLN: 0.5000 mg | INTRAMUSCULAR | Status: DC | PRN

## 2024-07-27 MED ORDER — FENTANYL CITRATE (PF) 250 MCG/5ML IJ SOLN
INTRAMUSCULAR | Status: AC
  Filled 2024-07-27: qty 5

## 2024-07-27 MED ORDER — IRRISEPT - 450ML BOTTLE WITH 0.05% CHG IN STERILE WATER, USP 99.95% OPTIME
TOPICAL | Status: DC | PRN
  Administered 2024-07-27: 450 mL via TOPICAL

## 2024-07-27 MED ORDER — FENTANYL CITRATE (PF) 100 MCG/2ML IJ SOLN
25.0000 ug | INTRAMUSCULAR | Status: DC | PRN
  Administered 2024-07-27: 50 ug via INTRAVENOUS
  Administered 2024-07-27: 25 ug via INTRAVENOUS
  Administered 2024-07-27: 50 ug via INTRAVENOUS
  Administered 2024-07-27: 25 ug via INTRAVENOUS

## 2024-07-27 MED ORDER — FENTANYL CITRATE (PF) 250 MCG/5ML IJ SOLN
INTRAMUSCULAR | Status: DC | PRN
  Administered 2024-07-27 (×5): 50 ug via INTRAVENOUS

## 2024-07-27 MED ORDER — ALBUMIN HUMAN 5 % IV SOLN: INTRAVENOUS | Status: DC | PRN

## 2024-07-27 MED ORDER — DAPAGLIFLOZIN PROPANEDIOL 10 MG PO TABS
10.0000 mg | ORAL_TABLET | Freq: Every day | ORAL | Status: DC
  Administered 2024-07-28: 10 mg via ORAL
  Filled 2024-07-27: qty 1

## 2024-07-27 MED ORDER — GLIPIZIDE 5 MG PO TABS
5.0000 mg | ORAL_TABLET | Freq: Two times a day (BID) | ORAL | Status: DC
  Administered 2024-07-28: 5 mg via ORAL
  Filled 2024-07-27: qty 1

## 2024-07-27 MED ORDER — 0.9 % SODIUM CHLORIDE (POUR BTL) OPTIME
TOPICAL | Status: DC | PRN
  Administered 2024-07-27: 1000 mL

## 2024-07-27 MED ORDER — CLONAZEPAM 0.5 MG PO TABS
0.5000 mg | ORAL_TABLET | Freq: Every day | ORAL | Status: DC
  Administered 2024-07-27: 0.5 mg via ORAL
  Filled 2024-07-27: qty 1

## 2024-07-27 MED ORDER — ORAL CARE MOUTH RINSE: 15.0000 mL | Freq: Once | OROMUCOSAL | Status: AC

## 2024-07-27 MED ORDER — METOCLOPRAMIDE HCL 5 MG PO TABS
5.0000 mg | ORAL_TABLET | Freq: Three times a day (TID) | ORAL | Status: DC | PRN
  Administered 2024-07-28: 5 mg via ORAL
  Filled 2024-07-27: qty 1

## 2024-07-27 MED ORDER — PROPOFOL 10 MG/ML IV BOLUS
INTRAVENOUS | Status: DC | PRN
  Administered 2024-07-27: 25 ug/kg/min via INTRAVENOUS
  Administered 2024-07-27: 130 mg via INTRAVENOUS

## 2024-07-27 MED ORDER — GABAPENTIN 300 MG PO CAPS
900.0000 mg | ORAL_CAPSULE | Freq: Two times a day (BID) | ORAL | Status: DC
  Administered 2024-07-27 – 2024-07-28 (×2): 900 mg via ORAL
  Filled 2024-07-27 (×2): qty 3

## 2024-07-27 MED ORDER — CLONIDINE HCL (ANALGESIA) 100 MCG/ML EP SOLN
EPIDURAL | Status: DC | PRN
  Administered 2024-07-27: 1 mL

## 2024-07-27 MED ORDER — ACETAMINOPHEN 500 MG PO TABS
1000.0000 mg | ORAL_TABLET | Freq: Once | ORAL | Status: AC
  Administered 2024-07-27: 1000 mg via ORAL
  Filled 2024-07-27: qty 2

## 2024-07-27 MED ORDER — ONDANSETRON HCL 4 MG/2ML IJ SOLN
INTRAMUSCULAR | Status: AC
  Filled 2024-07-27: qty 2

## 2024-07-27 MED ORDER — DROPERIDOL 2.5 MG/ML IJ SOLN
0.6250 mg | Freq: Once | INTRAMUSCULAR | Status: AC
  Administered 2024-07-27: 0.625 mg via INTRAVENOUS

## 2024-07-27 MED ORDER — TRANEXAMIC ACID-NACL 1000-0.7 MG/100ML-% IV SOLN
INTRAVENOUS | Status: DC | PRN
  Administered 2024-07-27: 1000 mg via INTRAVENOUS

## 2024-07-27 MED ORDER — METHOCARBAMOL 500 MG PO TABS
500.0000 mg | ORAL_TABLET | Freq: Four times a day (QID) | ORAL | Status: DC | PRN
  Administered 2024-07-27 – 2024-07-28 (×2): 500 mg via ORAL
  Filled 2024-07-27 (×2): qty 1

## 2024-07-27 MED ORDER — CHLORHEXIDINE GLUCONATE 0.12 % MT SOLN
15.0000 mL | Freq: Once | OROMUCOSAL | Status: AC
  Administered 2024-07-27: 15 mL via OROMUCOSAL

## 2024-07-27 MED ORDER — ROCURONIUM BROMIDE 10 MG/ML (PF) SYRINGE
PREFILLED_SYRINGE | INTRAVENOUS | Status: DC | PRN
  Administered 2024-07-27 (×4): 20 mg via INTRAVENOUS
  Administered 2024-07-27: 60 mg via INTRAVENOUS
  Administered 2024-07-27 (×2): 20 mg via INTRAVENOUS

## 2024-07-27 MED ORDER — EPHEDRINE SULFATE-NACL 50-0.9 MG/10ML-% IV SOSY
PREFILLED_SYRINGE | INTRAVENOUS | Status: DC | PRN
  Administered 2024-07-27: 5 mg via INTRAVENOUS
  Administered 2024-07-27: 2.5 mg via INTRAVENOUS

## 2024-07-27 MED ORDER — BUPIVACAINE HCL (PF) 0.25 % IJ SOLN
INTRAMUSCULAR | Status: AC
  Filled 2024-07-27: qty 30

## 2024-07-27 MED ORDER — HYDRALAZINE HCL 20 MG/ML IJ SOLN
INTRAMUSCULAR | Status: AC
  Filled 2024-07-27: qty 1

## 2024-07-27 MED ORDER — PHENYLEPHRINE 80 MCG/ML (10ML) SYRINGE FOR IV PUSH (FOR BLOOD PRESSURE SUPPORT)
PREFILLED_SYRINGE | INTRAVENOUS | Status: AC
  Filled 2024-07-27: qty 10

## 2024-07-27 MED ORDER — KETAMINE HCL 50 MG/5ML IJ SOSY
PREFILLED_SYRINGE | INTRAMUSCULAR | Status: DC | PRN
  Administered 2024-07-27: 20 mg via INTRAVENOUS

## 2024-07-27 MED ORDER — OXYCODONE HCL 5 MG/5ML PO SOLN: 5.0000 mg | Freq: Once | ORAL | Status: AC | PRN

## 2024-07-27 MED ORDER — BUPIVACAINE LIPOSOME 1.3 % IJ SUSP
INTRAMUSCULAR | Status: AC
  Filled 2024-07-27: qty 20

## 2024-07-27 MED ORDER — OXYCODONE HCL 5 MG PO TABS
ORAL_TABLET | ORAL | Status: AC
  Filled 2024-07-27: qty 1

## 2024-07-27 MED ORDER — DEXMEDETOMIDINE HCL IN NACL 80 MCG/20ML IV SOLN
INTRAVENOUS | Status: AC
  Filled 2024-07-27: qty 20

## 2024-07-27 NOTE — H&P (Signed)
 TOTAL HIP ADMISSION H&P  Patient is admitted for left total hip arthroplasty.  Subjective:  Chief Complaint: left hip pain  HPI: [name], 74 y.o. male, has a history of pain and functional disability in the left hip(s) due to arthritis and patient has failed non-surgical conservative treatments for greater than 12 weeks to include corticosteriod injections, flexibility and strengthening excercises, use of assistive devices, and activity modification.  Onset of symptoms was gradual starting 2 years ago with rapidlly worsening course since that time.The patient noted no past surgery on the left hip(s).  Patient currently rates pain in the left hip at 9 out of 10 with activity. Patient has night pain, worsening of pain with activity and weight bearing, trendelenberg gait, pain that interfers with activities of daily living, pain with passive range of motion, crepitus, and joint swelling. Patient has evidence of subchondral sclerosis and joint space narrowing by imaging studies. This condition presents safety issues increasing the risk of falls. This patient has had a history of low back pain with radiculopathy as well.  There may be a component of his back pain which is affecting his symptoms below the knee.  Most of his symptoms however are in the groin anteriorly and in the buttock posteriorly and remaining up above the knee.  Definitely has significant pain with any passive range of motion of that left hip..  There is no current active infection.  No personal or family history of DVT or pulmonary embolism.  Based on the severity of his symptoms the patient wants to proceed with hip replacement despite his hemoglobin A1c being 8.3.  That may be consistent with his pain that he has had over the past month or 2.  The importance of getting that under tighter control to diminish chances for postop infection or discussed.  Even though it is higher risk the patient states he does not really want to wait a  month or 2 to get that lower.  We will take the usual precautions to diminish his chances of having a hip infection.  Patient understands the slightly higher risk he is at for having an infection but wishes to proceed based on the severity of his symptoms  Patient Active Problem List   Diagnosis Date Noted   Presence of drug coated stent in left circumflex coronary artery 05/14/2024   Chronic anemia 05/14/2024   Statin myopathy 05/14/2024   Pain in left hip 02/26/2022   Unstable angina (HCC) 01/10/2022   BPH with urinary obstruction 08/05/2021   Erectile dysfunction 08/05/2021   Type 2 diabetes mellitus without complication, without long-term current use of insulin  (HCC) 08/04/2017   Osteoarthritis 04/28/2017   Insomnia 03/03/2017   Fatigue 05/28/2014   Anxiety state 06/09/2010   NECK PAIN 09/03/2009   LEG PAIN 07/16/2008   HEADACHE 07/16/2008   LOSS, HEARING NOS 05/03/2007   Hyperlipidemia with target low density lipoprotein (LDL) cholesterol less than 55 mg/dL 90/70/7991   Essential hypertension 04/04/2007   Coronary artery disease involving native coronary artery without angina pectoris 2002   Past Medical History:  Diagnosis Date   Anemia    Anginal pain    Arthritis    Chronic headache    Chronic neck pain    Coronary artery disease    Diabetes mellitus without complication (HCC)    Diverticulosis    Hyperlipidemia    Hypertension 12/26/2008   ECHO- EF>55%   Insomnia    Insomnia    Restless leg syndrome  Past Surgical History:  Procedure Laterality Date   APPENDECTOMY     cervical spine injection     COLONOSCOPY  04/02/2006   per [facility], clear, repeat in 10 yrs   CORONARY ARTERY BYPASS GRAFT  07/06/2000   4 vessels   CORONARY PRESSURE/FFR STUDY N/A 01/12/2022   Procedure: INTRAVASCULAR PRESSURE WIRE/FFR STUDY;  Surgeon: Court Dorn PARAS, MD;  Location: [facility] INVASIVE CV LAB;  Service: Cardiovascular;  Laterality: N/A;   CORONARY STENT INTERVENTION N/A  01/12/2022   Procedure: CORONARY STENT INTERVENTION;  Surgeon: Court Dorn PARAS, MD;  Location: [facility] INVASIVE CV LAB;  Service: Cardiovascular;  Laterality: N/A;   CYST REMOVAL HAND Right    LEFT HEART CATH AND CORS/GRAFTS ANGIOGRAPHY N/A 01/12/2022   Procedure: LEFT HEART CATH AND CORS/GRAFTS ANGIOGRAPHY;  Surgeon: Court Dorn PARAS, MD;  Location: [facility] INVASIVE CV [facility];  Service: Cardiovascular;  Laterality: N/A;    Current Facility-Administered Medications  Medication Dose Route Frequency Provider Last Rate Last Admin   ceFAZolin  (ANCEF ) IVPB 2g/100 mL premix  2 g Intravenous On Call to OR Magnant, Carlin CROME, PA-C       chlorhexidine  (PERIDEX ) 0.12 % solution 15 mL  15 mL Mouth/Throat Once Leonce Athens, MD       Or   Oral care mouth rinse  15 mL Mouth Rinse Once Leonce Athens, MD       insulin  aspart (novoLOG ) injection 0-7 Units  0-7 Units Subcutaneous Q2H PRN Leopoldo Bruckner, MD       lactated ringers  infusion   Intravenous Continuous Leonce Athens, MD       lactated ringers  infusion   Intravenous Continuous Leopoldo Bruckner, MD 10 mL/hr at 07/27/24 1227 Continued from Pre-op at 07/27/24 1227   povidone-iodine  (BETADINE ) 7.5 % scrub   Topical Once Magnant, Charles L, PA-C       povidone-iodine  10 % swab 2 Application  2 Application Topical Once Magnant, Charles L, PA-C       Allergies[1]  Social History   Tobacco Use   Smoking status: Former    Current packs/day: 0.00    Average packs/day: 0.3 packs/day for 10.0 years (2.5 ttl pk-yrs)    Types: Cigarettes    Start date: 11/25/1960    Quit date: 11/26/1970    Years since quitting: 53.7   Smokeless tobacco: Never  Substance Use Topics   Alcohol use: No    Alcohol/week: 0.0 standard drinks of alcohol    Family History  Problem Relation Age of Onset   Heart disease Mother    Liver cancer Father    Cancer Brother    Cancer Brother    Cancer Brother    Coronary artery disease Other    Hypertension Other    Colon  cancer Neg Hx    Stomach cancer Neg Hx    Esophageal cancer Neg Hx    Colon polyps Neg Hx    Rectal cancer Neg Hx      Review of Systems  Musculoskeletal:  Positive for arthralgias.  All other systems reviewed and are negative.   Objective:  Physical Exam Vitals reviewed.  HENT:     Head: Normocephalic.     Nose: Nose normal.     Mouth/Throat:     Mouth: Mucous membranes are moist.  Eyes:     Pupils: Pupils are equal, round, and reactive to light.  Cardiovascular:     Rate and Rhythm: Normal rate.     Pulses: Normal pulses.  Pulmonary:  Effort: Pulmonary effort is normal.  Abdominal:     General: Abdomen is flat.  Musculoskeletal:     Cervical back: Normal range of motion.  Skin:    General: Skin is warm.     Capillary Refill: Capillary refill takes less than 2 seconds.  Neurological:     General: No focal deficit present.     Mental Status: He is alert.  Psychiatric:        Mood and Affect: Mood normal.     Ortho exam demonstrates 5 out of 5 ankle dorsiflexion plantarflexion quad and hamstring strength bilaterally.  Hip flexion strength is present but it is painful.  Does have a lot of pain with internal and external rotation of the hip.  Pedal pulses palpable.  Ankle dorsiflexion intact.  No definite paresthesias L1-S1 bilaterally but the patient does describe some pain radiating to the lateral calf region which is not coming from the hip.  Vital signs in last 24 hours: Temp:  [98.2 F (36.8 C)] 98.2 F (36.8 C) (01/22 1157) Pulse Rate:  [63] 63 (01/22 1157) Resp:  [18] 18 (01/22 1157) BP: (171)/(86) 171/86 (01/22 1157) SpO2:  [97 %] 97 % (01/22 1157) Weight:  [94.4 kg] 94.4 kg (01/22 1157)  Labs:   Estimated body mass index is 30.73 kg/m as calculated from the following:   Height as of this encounter: 5' 9 (1.753 m).   Weight as of this encounter: 94.4 kg.   Imaging Review Plain radiographs demonstrate severe degenerative joint disease of the left  hip(s). The bone quality appears to be excellent for age and reported activity level.      Assessment/Plan:  End stage arthritis, left hip(s)  The patient history, physical examination, clinical judgement of the provider and imaging studies are consistent with end stage degenerative joint disease of the left hip(s) and total hip arthroplasty is deemed medically necessary. The treatment options including medical management, injection therapy, arthroscopy and arthroplasty were discussed at length. The risks and benefits of total hip arthroplasty were presented and reviewed. The risks due to aseptic loosening, infection, stiffness, dislocation/subluxation,  thromboembolic complications and other imponderables were discussed.  The patient acknowledged the explanation, agreed to proceed with the plan and consent was signed. Patient is being admitted for inpatient treatment for surgery, pain control, PT, OT, prophylactic antibiotics, VTE prophylaxis, progressive ambulation and ADL's and discharge planning.The patient is planning to be discharged home with home health services  Impression is end-stage left hip arthritis. Patient also has some neuropathy as well as left-sided L4 foraminal stenosis which radiographically has improved compared to last study. His best bet for relief would be left hip replacement. The risk and benefits are discussed including but limited to infection or vessel damage incomplete pain relief as well as incomplete functional restoration instability and leg length inequality. Patient understands the risk and benefits and wishes to proceed. One-time prescription for tramadol  prescribed. All questions answered    Patient's anticipated LOS is less than 2 midnights, meeting these requirements: - Younger than 67 - Lives within 1 hour of care - Has a competent adult at home to recover with post-op recover - NO history of  - Chronic pain requiring opiods  - Diabetes  - Coronary  Artery Disease  - Heart failure  - Heart attack  - Stroke  - DVT/VTE  - Cardiac arrhythmia  - Respiratory Failure/COPD  - Renal failure  - Anemia  - Advanced Liver disease      [1]  Allergies Allergen Reactions   Codeine Anaphylaxis    Pt. verbalized that he has taken and can take Hydrocodone , Oxycodone  w/o issue - codeine allergy only   Ambien [Zolpidem Tartrate] Other (See Comments)    Headache    Cipro [Ciprofloxacin Hcl] Hives and Rash   Crestor  [Rosuvastatin ] Other (See Comments)    Myalgia    Isordil  [Isosorbide  Nitrate] Other (See Comments)    Headaches   Lipitor [Atorvastatin] Other (See Comments)    Myalgia    Victoza  [Liraglutide ] Rash   Zetia  [Ezetimibe ] Other (See Comments)    Myalgia

## 2024-07-27 NOTE — Op Note (Signed)
 NAMESHERROD, [name] MEDICAL RECORD NO: [id number] ACCOUNT NO: [id number] DATE OF BIRTH: Aug 17, 1950 FACILITY: [facility] LOCATION: [facility] PHYSICIAN: Cordella RAMAN. Addie, MD  Operative Report   PREOPERATIVE DIAGNOSIS:  Left hip arthritis.  POSTOPERATIVE DIAGNOSIS:  Left hip arthritis.  PROCEDURE:  Left total hip replacement.  SURGEON:  Cordella RAMAN. Addie, MD  ASSISTANT:  Herlene Calix, PA.  INDICATIONS:  The patient is a 74 year old patient with end-stage left hip arthritis with failure of conservative management.  He presents for operative management after explanation of risks and benefits.  IMPLANTS UTILIZED:  DePuy ACTIS stem size 7, +1.5 ceramic 36 mm head with a 52 EMPHASYS cup +4 liner.  DESCRIPTION OF PROCEDURE:  The patient was brought to the operating room where general anesthetic was induced.  Preoperative antibiotics were administered.  A timeout was called.  The patient was placed on the Hana bed.  The left hip was prescrubbed with  alcohol and Betadine , allowed to air dry, prepped with DuraPrep solution, and draped in a sterile manner.  Ioban was used to cover the operative field.  A timeout was called.  An incision was made about 2 cm distal to the anterior superior iliac crest,  extending distally about 10 cm.  Skin and subcutaneous tissue were sharply divided.  The plane between the subdermal tissue and the tensor fascia lata was developed.  An incision was then made over the tensor fascia lata extending up to the anterior  superior iliac crest.  Care was taken to avoid injury to crossing sensory nerves when possible.  The plane between the tensor and the rectus was then developed.  Crossing vessels were cauterized.  A retractor was placed inferior and superior on the  femoral neck.  Capsule was incised.  Severe arthritis was present.  The patient was progressively externally rotated and capsular release was performed down to the lesser tuberosity.  We also tagged the capsular flaps  using #1 Ethibond suture.  Next, the  neck cut was made after fluoroscopic confirmation of a good level.  That had to be revised just to get a little bit more of a head cut.  The head itself was of poor quality bone and would not hold a corkscrew.  We had to take the head out piecemeal.   With that performed, we did revise the head cut down about 5 more mm.  A branch of the obturator artery was encountered, which was controlled using electrocautery and Surgicel.  At this time, the transverse acetabular ligament was released.  Reaming was  performed up to a size 51.  This was done in approximately 10 degrees of anteversion and 45 degrees of abduction.  The cup was then placed with a good peripheral fit.  Next, a screw was placed into the superolateral quadrant.  Good purchase was obtained.   The liner was then placed and we directed our attention towards the femur.  The leg was externally rotated about 135.  We also used IrriSept after the incision and at multiple times during the case.  We had to extend the incision because of overhanging  abdominal girth.  That allowed us  to get better access to the proximal femur.  A femoral lift was also placed.  With the proximal femur exposed, broaching was performed up to a size 7.  Coplaning of the calcar was performed.  Bone quality was somewhat  better in the femur than in the femoral head.  The 7 was a very good fit.  We did  trial reductions with the +0 and +1.5.  The +1.5 gave excellent stability with equal leg lengths as well as stability with extension of 70 degrees and external rotation of  90 degrees.  Also stable posteriorly with about 70 degrees of hip flexion.  The trial femur was removed and then tapped into the canal, which was irrigated with IrriSept solution.  We did place some vancomycin  powder in the canal.  Next, we placed the  true stem with a very good fit obtained and reduced it again with the +1.5 ball, which was again very stable.  We then  placed the true ceramic ball onto the dried Morse taper and reduced the hip, and it was stable with equal leg lengths.  Thorough  irrigation was performed with pulsatile irrigation x3 L.  The capsule and muscle were anesthetized using Marcaine , Exparel , and saline.  Vancomycin  powder was placed on the implant.  The capsule was then closed using #1 Vicryl suture.  Dead space closed  using also #1 Vicryl suture.  The fascia lata was then closed using #1 Vicryl suture.  The skin was then closed using interrupted inverted 0 Vicryl suture, 2-0 Vicryl suture, and 3-0 Monocryl.  We did put an incisional VAC on the incision because the  patient is diabetic.  The patient tolerated the procedure well without immediate complications and was transferred to the recovery room in stable condition.   NIK D: 07/27/2024 5:43:38 pm T: 07/27/2024 7:15:00 pm  JOB: [id number]/ [id number]

## 2024-07-27 NOTE — Anesthesia Postprocedure Evaluation (Signed)
"   Anesthesia Post Note  Patient: [name]  Procedure(s) Performed: ARTHROPLASTY, HIP, TOTAL, ANTERIOR APPROACH (Left: Hip)     Patient location during evaluation: PACU Anesthesia Type: General Level of consciousness: awake and alert Pain management: pain level controlled Vital Signs Assessment: post-procedure vital signs reviewed and stable Respiratory status: spontaneous breathing, nonlabored ventilation and respiratory function stable Cardiovascular status: blood pressure returned to baseline and stable Postop Assessment: no apparent nausea or vomiting Anesthetic complications: no   No notable events documented.  Last Vitals:  Vitals:   07/27/24 1739 07/27/24 1800  BP: 130/66 137/72  Pulse: (!) 102 96  Resp: 20 15  Temp: 36.9 C   SpO2: 93% 95%    Last Pain:                 Garnette FORBES Skillern      "

## 2024-07-27 NOTE — Brief Op Note (Signed)
" ° °  07/27/2024  5:38 PM  PATIENT:  [name]  74 y.o. male  PRE-OPERATIVE DIAGNOSIS:  left hip osteoarthritis  POST-OPERATIVE DIAGNOSIS:  left hip osteoarthritis  PROCEDURE:  Procedures: ARTHROPLASTY, HIP, TOTAL, ANTERIOR APPROACH  SURGEON:  Surgeon(s): Addie, Cordella Hamilton, MD  ASSISTANT: magnant pa  ANESTHESIA:   general  EBL: 450 ml    Total I/O In: 1700 [I.V.:1000; IV Piggyback:700] Out: 1150 [Urine:700; Blood:450]  BLOOD ADMINISTERED: none  DRAINS: none   LOCAL MEDICATIONS USED:  marcaine  morphine  clonidine  exparel  vanco powder  SPECIMEN:  No Specimen  COUNTS:  YES  TOURNIQUET:  * No tourniquets in log *  DICTATION: .Other Dictation: Dictation Number done  PLAN OF CARE: Admit for overnight observation  PATIENT DISPOSITION:  PACU - hemodynamically stable              "

## 2024-07-27 NOTE — Anesthesia Procedure Notes (Signed)
 Procedure Name: Intubation Date/Time: 07/27/2024 2:14 PM  Performed by: Harrold Macintosh, CRNAPre-anesthesia Checklist: Patient identified, Emergency Drugs available, Suction available and Patient being monitored Patient Re-evaluated:Patient Re-evaluated prior to induction Oxygen Delivery Method: Circle system utilized Preoxygenation: Pre-oxygenation with 100% oxygen Induction Type: IV induction Ventilation: Mask ventilation without difficulty Laryngoscope Size: Miller and 2 Grade View: Grade I Tube type: Oral Tube size: 7.5 mm Number of attempts: 1 Airway Equipment and Method: Stylet Placement Confirmation: ETT inserted through vocal cords under direct vision, positive ETCO2 and breath sounds checked- equal and bilateral Secured at: 21 cm Tube secured with: Tape Dental Injury: Teeth and Oropharynx as per pre-operative assessment

## 2024-07-27 NOTE — Transfer of Care (Signed)
 Immediate Anesthesia Transfer of Care Note  Patient: Hatcher VEAR Holts  Procedure(s) Performed: ARTHROPLASTY, HIP, TOTAL, ANTERIOR APPROACH (Left: Hip)  Patient Location: PACU  Anesthesia Type:General  Level of Consciousness: awake, oriented, drowsy, and responds to stimulation  Airway & Oxygen Therapy: Patient Spontanous Breathing and Patient connected to face mask oxygen  Post-op Assessment: Report given to RN, Post -op Vital signs reviewed and stable, Patient moving all extremities X 4, and Patient able to stick tongue midline  Post vital signs: Reviewed and stable  Last Vitals:  Vitals Value Taken Time  BP 130/66 07/27/24 17:39  Temp 36.9 C 07/27/24 17:39  Pulse 101 07/27/24 17:40  Resp 22 07/27/24 17:40  SpO2 92 % 07/27/24 17:40  Vitals shown include unfiled device data.  Last Pain:  Vitals:   07/27/24 1227  TempSrc:   PainSc: 9       Patients Stated Pain Goal: 4 (07/27/24 1227)  Complications: No notable events documented.

## 2024-07-28 ENCOUNTER — Other Ambulatory Visit (HOSPITAL_COMMUNITY): Payer: Self-pay

## 2024-07-28 ENCOUNTER — Encounter (HOSPITAL_COMMUNITY): Payer: Self-pay | Admitting: Orthopedic Surgery

## 2024-07-28 DIAGNOSIS — M1612 Unilateral primary osteoarthritis, left hip: Secondary | ICD-10-CM | POA: Diagnosis not present

## 2024-07-28 MED ORDER — DOCUSATE SODIUM 100 MG PO CAPS
100.0000 mg | ORAL_CAPSULE | Freq: Two times a day (BID) | ORAL | 0 refills | Status: AC
  Filled 2024-07-28: qty 10, 5d supply, fill #0

## 2024-07-28 MED ORDER — GABAPENTIN 300 MG PO CAPS
900.0000 mg | ORAL_CAPSULE | Freq: Two times a day (BID) | ORAL | 0 refills | Status: AC
  Filled 2024-07-28: qty 30, 5d supply, fill #0

## 2024-07-28 MED ORDER — METHOCARBAMOL 500 MG PO TABS
500.0000 mg | ORAL_TABLET | Freq: Three times a day (TID) | ORAL | 0 refills | Status: AC | PRN
  Filled 2024-07-28: qty 30, 10d supply, fill #0

## 2024-07-28 MED ORDER — ONDANSETRON HCL 4 MG PO TABS
4.0000 mg | ORAL_TABLET | Freq: Three times a day (TID) | ORAL | 0 refills | Status: AC | PRN
  Filled 2024-07-28: qty 20, 7d supply, fill #0

## 2024-07-28 MED ORDER — ASPIRIN 81 MG PO TBEC
81.0000 mg | DELAYED_RELEASE_TABLET | Freq: Two times a day (BID) | ORAL | 12 refills | Status: AC
  Filled 2024-07-28: qty 30, 15d supply, fill #0

## 2024-07-28 MED ORDER — OXYCODONE HCL 5 MG PO TABS
5.0000 mg | ORAL_TABLET | ORAL | 0 refills | Status: AC | PRN
  Filled 2024-07-28: qty 30, 3d supply, fill #0

## 2024-07-28 NOTE — Progress Notes (Signed)
 PM wrap up treatment    Pt has progressed through all mobility and education.  No further acute PT needs, but could benefit from ST HHPT.  Will sign off.     07/28/24 1300  PT Visit Information  Last PT Received On 07/28/24  Assistance Needed +1  History of Present Illness pt is a 74 y/o male admitted 1/22 for elective THA to L hip.  PMHx:  RLS, HTN, CAD, DM  Subjective Data  Patient Stated Goal Home today  Precautions  Precautions Anterior Hip  Precaution Booklet Issued Yes (comment)  Restrictions  Weight Bearing Restrictions Per Provider Order Yes  LLE Weight Bearing Per Provider Order WBAT  Pain Assessment  Pain Assessment Faces  Faces Pain Scale 4  Pain Location L incision  Pain Descriptors / Indicators Sore  Pain Intervention(s) Monitored during session  Cognition  Arousal Alert  Behavior During Therapy WFL for tasks assessed/performed  PT - Cognitive impairments No apparent impairments  Following Commands  Following commands Intact  Cueing  Cueing Techniques Verbal cues  Communication  Communication No apparent difficulties  Bed Mobility  Overal bed mobility Needs Assistance  Bed Mobility Supine to Sit;Sit to Supine  Supine to sit Supervision  Sit to supine Supervision  General bed mobility comments cues for best technique  Transfers  Overall transfer level Needs assistance  Transfers Sit to/from Stand  Sit to Stand Supervision  General transfer comment cues for hand placement  Ambulation/Gait  Ambulation/Gait assistance Supervision  Gait Distance (Feet) 180 Feet (x2)  Assistive device Rolling walker (2 wheels)  Gait Pattern/deviations Step-to pattern;Step-through pattern  General Gait Details short, steady, but mildly tentative steps.  Gait velocity interpretation <1.8 ft/sec, indicate of risk for recurrent falls  Stairs Yes  Stairs assistance Supervision  Stair Management One rail Right;Step to pattern;Forwards  Number of Stairs 5  General stair  comments safe technique  Balance  Sitting balance-Leahy Scale Good  Standing balance-Leahy Scale Fair  General Comments  General comments (skin integrity, edema, etc.) basic VAC dressing education  Exercises  Exercises Other exercises;Total Joint  Other Exercises  Other Exercises Went over each THA exercise and pt demo'd understanding of each.  AM-PAC PT 6 Clicks Mobility Outcome Measure (Version 2)  Help needed turning from your back to your side while in a flat bed without using bedrails? 3  Help needed moving from lying on your back to sitting on the side of a flat bed without using bedrails? 3  Help needed moving to and from a bed to a chair (including a wheelchair)? 3  Help needed standing up from a chair using your arms (e.g., wheelchair or bedside chair)? 3  Help needed to walk in hospital room? 3  Help needed climbing 3-5 steps with a railing?  3  6 Click Score 18  Consider Recommendation of Discharge To: Home with [facility]  Progressive Mobility  What is the highest level of mobility based on the mobility assessment? Level 4 (Ambulates with assistance) - Balance while stepping forward/back - Complete  Mobility Referral Yes  Activity Ambulated with assistance  PT Goal Progression  Progress towards PT goals Progressing toward goals  PT Time Calculation  PT Start Time (ACUTE ONLY) 1226  PT Stop Time (ACUTE ONLY) 1250  PT Time Calculation (min) (ACUTE ONLY) 24 min  PT General Charges  $$ ACUTE PT VISIT 1 Visit  PT Treatments  $Gait Training 8-22 mins  $Therapeutic Exercise 8-22 mins   07/28/2024  India HERO., PT Acute  Rehabilitation Services [phone]  (office)

## 2024-07-28 NOTE — TOC Transition Note (Signed)
 Transition of Care [facility]) - Discharge Note   Patient Details  Name: [name] MRN: [id number] Date of Birth: 23-Dec-1950  [facility]) CM/SW Contact:  Rosalva [name] Bloch, RN Phone Number: 07/28/2024, 10:57 AM   Clinical Narrative:    Patient will DC to: home Anticipated DC date: 07/28/2024 Family notified: yes Transport by: car        - s/p Left total hip replacement  Per MD patient ready for DC today pending therapy clearance. RN, patient, patient's  wife and [facility] notified of DC. Pt without DME needs, states has RW and raised toilet seat @ home.  Pt without RX med concerns or transportation issues.  Wife to provie transportation to home. Post hospital f/u noted on AVS.  RNCM will sign off for now as intervention is no longer needed. Please consult us  again if new needs arise.   Final next level of care: Home w Home Health Services Barriers to Discharge: No Barriers Identified   Patient Goals and CMS Choice            Discharge Placement                       Discharge Plan and Services Additional resources added to the After Visit Summary for                            [facility] Arranged: PT HH Agency: [facility] Date HH Agency Contacted: 07/28/24 Time HH Agency Contacted: 1054 Representative spoke with at [facility] Agency: [facility]  Social Drivers of Health (SDOH) Interventions SDOH Screenings   Food Insecurity: No Food Insecurity (07/27/2024)  Housing: Low Risk (07/27/2024)  Transportation Needs: No Transportation Needs (07/27/2024)  Utilities: Not At Risk (07/27/2024)  Alcohol Screen: Low Risk (10/13/2021)  Depression (PHQ2-9): Medium Risk (06/20/2024)  Financial Resource Strain: Low Risk (10/13/2021)  Physical Activity: Sufficiently Active (06/20/2024)  Social Connections: Socially Integrated (07/27/2024)  Stress: No Stress Concern Present (06/20/2024)  Tobacco Use: Medium Risk (07/27/2024)  Health  Literacy: Adequate Health Literacy (06/20/2024)     Readmission Risk Interventions     No data to display

## 2024-07-28 NOTE — Care Management Obs Status (Signed)
 MEDICARE OBSERVATION STATUS NOTIFICATION   Patient Details  Name: [name] MRN: [id number] Date of Birth: [birth year]   Medicare Observation Status Notification Given:  Yes    [name] 07/28/2024, 12:53 PM

## 2024-07-28 NOTE — Progress Notes (Signed)
" °  Subjective: [name] is a 74 y.o. male s/p left THA.  They are POD 1.  Pt's pain is controlled.  Pt has ambulated with PT down the hall.  No stair training yet.. Patient denies any chest pain, dyspnea, or calf pain   Objective: Vital signs in last 24 hours: Temp:  [98 F (36.7 C)-98.4 F (36.9 C)] 98.3 F (36.8 C) (01/23 0747) Pulse Rate:  [63-107] 96 (01/23 0747) Resp:  [10-20] 16 (01/23 0747) BP: (127-171)/(63-86) 144/67 (01/23 0747) SpO2:  [91 %-97 %] 95 % (01/23 0747) FiO2 (%):  [21 %] 21 % (01/22 2034) Weight:  [94.4 kg] 94.4 kg (01/22 1157)  Intake/Output from previous day: 01/22 0701 - 01/23 0700 In: 2949.5 [P.O.:240; I.V.:1858.1; IV Piggyback:851.5] Out: 2725 [Urine:2075; Emesis/NG output:200; Blood:450] Intake/Output this shift: No intake/output data recorded.  Exam:  No gross blood or drainage overlying the dressing Palpable pedal pulses Able to dorsiflex and plantarflex the ankle of the operative leg No calf tenderness bilaterally. Negative Homan's sign bilaterally Leg lengths are roughly equivalent   Labs: No results for input(s): HGB in the last 72 hours. No results for input(s): WBC, RBC, HCT, PLT in the last 72 hours. No results for input(s): NA, K, CL, CO2, BUN, CREATININE, GLUCOSE, CALCIUM  in the last 72 hours. No results for input(s): LABPT, INR in the last 72 hours.  Assessment/Plan: Pt is s/p THA  -Plan to discharge to home today pending final physical therapy eval.  Just needs to accomplish stair training.  Discharge orders placed.  Spoke with RN.  -WBAT with a walker  - Follow-up with Dr. Addie in clinic in 1 week for wound VAC removal and replacement with Aquacel dressing.     [name] 07/28/2024, 11:55 AM       "

## 2024-07-28 NOTE — Progress Notes (Deleted)
 Physical Therapy Treatment Patient Details Name: [name] MRN: [id number] DOB: December 05, 1950 Today's Date: 07/28/2024   History of Present Illness pt is a 74 y/o male admitted 1/22 for elective THA to L hip.  PMHx:  RLS, HTN, CAD, DM    PT Comments  Pt has progressed through all mobility and education.  No further acute PT needs, but could benefit from ST HHPT.  Will sign off.    If plan is discharge home, recommend the following: Other (comment) (PRN assist)   Can travel by private vehicle        Equipment Recommendations  None recommended by PT    Recommendations for Other Services       Precautions / Restrictions Precautions Precautions: Anterior Hip Precaution Booklet Issued: Yes (comment) Restrictions Weight Bearing Restrictions Per Provider Order: Yes LLE Weight Bearing Per Provider Order: Weight bearing as tolerated     Mobility  Bed Mobility Overal bed mobility: Needs Assistance Bed Mobility: Supine to Sit, Sit to Supine     Supine to sit: Supervision Sit to supine: Supervision   General bed mobility comments: cues for best technique    Transfers Overall transfer level: Needs assistance   Transfers: Sit to/from Stand Sit to Stand: Supervision           General transfer comment: cues for hand placement    Ambulation/Gait Ambulation/Gait assistance: Supervision Gait Distance (Feet): 180 Feet (x2) Assistive device: Rolling walker (2 wheels) Gait Pattern/deviations: Step-to pattern, Step-through pattern   Gait velocity interpretation: <1.8 ft/sec, indicate of risk for recurrent falls   General Gait Details: short, steady, but mildly tentative steps.   Stairs Stairs: Yes Stairs assistance: Supervision Stair Management: One rail Right, Step to pattern, Forwards Number of Stairs: 5 General stair comments: safe technique   Wheelchair Mobility     Tilt Bed    Modified Rankin (Stroke Patients Only)       Balance Overall balance  assessment: Needs assistance Sitting-balance support: No upper extremity supported, Feet unsupported Sitting balance-Leahy Scale: Good     Standing balance support: No upper extremity supported, During functional activity, Single extremity supported Standing balance-Leahy Scale: Fair                              Hotel Manager: No apparent difficulties  Cognition Arousal: Alert Behavior During Therapy: WFL for tasks assessed/performed   PT - Cognitive impairments: No apparent impairments                         Following commands: Intact      Cueing Cueing Techniques: Verbal cues  Exercises Other Exercises Other Exercises: Went over each THA exercise and pt demo'd understanding of each.    General Comments General comments (skin integrity, edema, etc.): basic VAC dressing education      Pertinent Vitals/Pain Pain Assessment Pain Assessment: Faces Faces Pain Scale: Hurts little more Pain Location: L incision Pain Descriptors / Indicators: Sore Pain Intervention(s): Monitored during session    Home Living Family/patient expects to be discharged to:: Private residence Living Arrangements: Spouse/significant other Available Help at Discharge: Family;Available 24 hours/day Type of Home: House Home Access: Stairs to enter Entrance Stairs-Rails: Doctor, General Practice of Steps: 4   Home Layout: One level Home Equipment: Agricultural Consultant (2 wheels);Rollator (4 wheels);Cane - single point;Shower seat      Prior Function  PT Goals (current goals can now be found in the care plan section) Acute Rehab PT Goals Patient Stated Goal: Home today PT Goal Formulation: With patient Time For Goal Achievement: 07/29/24 Potential to Achieve Goals: Good Progress towards PT goals: Progressing toward goals    Frequency    7X/week      PT Plan      Co-evaluation              AM-PAC PT 6  Clicks Mobility   Outcome Measure  Help needed turning from your back to your side while in a flat bed without using bedrails?: A Little Help needed moving from lying on your back to sitting on the side of a flat bed without using bedrails?: A Little Help needed moving to and from a bed to a chair (including a wheelchair)?: A Little Help needed standing up from a chair using your arms (e.g., wheelchair or bedside chair)?: A Little Help needed to walk in hospital room?: A Little Help needed climbing 3-5 steps with a railing? : A Little 6 Click Score: 18    End of Session   Activity Tolerance: Patient tolerated treatment well Patient left: in bed;with call bell/phone within reach;with family/visitor present Nurse Communication: Mobility status PT Visit Diagnosis: Other abnormalities of gait and mobility (R26.89);Difficulty in walking, not elsewhere classified (R26.2)     Time: 8773-8749 PT Time Calculation (min) (ACUTE ONLY): 24 min  Charges:    $Gait Training: 8-22 mins $Therapeutic Exercise: 8-22 mins $Therapeutic Activity: 8-22 mins PT General Charges $$ ACUTE PT VISIT: 1 Visit                     07/28/2024  [name]., PT Acute Rehabilitation Services [phone]  (office)   [name] 07/28/2024, 1:11 PM

## 2024-08-02 ENCOUNTER — Inpatient Hospital Stay: Admitting: Family Medicine

## 2024-08-02 NOTE — Discharge Summary (Signed)
 Physician Discharge Summary      Patient ID: [name] MRN: [id number] DOB/AGE: 08-23-1950 74 y.o.  Admit date: 07/27/2024 Discharge date: 07/28/2024  Admission Diagnoses:  S/p left total hip arthroplasty  Discharge Diagnoses:  Same  Surgeries: Procedures: ARTHROPLASTY, HIP, TOTAL, ANTERIOR APPROACH on 07/27/2024   Consultants:   Discharged Condition: Stable  Hospital Course: [name] is an 74 y.o. 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  They were brought to the operating room on 07/27/2024 and underwent the above named procedures.  Pt awoke from anesthesia without complication and was transferred to the floor. On POD1, patient's pain was overall controlled, no red flag signs or symptoms throughout his stay.  He performed well and was able to mobilize well with physical therapy.  Discharged home on POD 1.  Pt will f/u with Dr. Addie in clinic in ~2 weeks.   Antibiotics given:  Anti-infectives (From admission, onward)    Start     Dose/Rate Route Frequency Ordered Stop   07/27/24 2200  ceFAZolin  (ANCEF ) IVPB 2g/100 mL premix        2 g 200 mL/hr over 30 Minutes Intravenous Every 8 hours 07/27/24 2103 07/28/24 1023   07/27/24 1526  vancomycin  (VANCOCIN ) powder  Status:  Discontinued          As needed 07/27/24 1528 07/27/24 1735   07/27/24 1200  ceFAZolin  (ANCEF ) IVPB 2g/100 mL premix        2 g 200 mL/hr over 30 Minutes Intravenous On call to O.R. 07/27/24 1150 07/27/24 1446     .  Recent vital signs:  Vitals:   07/28/24 0418 07/28/24 0747  BP: (!) 152/77 (!) 144/67  Pulse: (!) 103 96  Resp: 18 16  Temp: 98.3 F (36.8 C) 98.3 F (36.8 C)  SpO2: 94% 95%    Recent laboratory studies:  Results for orders placed or performed during the hospital encounter of 07/27/24  Glucose, capillary   Collection Time: 07/27/24 11:58 AM  Result Value Ref Range   Glucose-Capillary  142 (H) 70 - 99 mg/dL   Comment 1 Notify RN   Glucose, capillary   Collection Time: 07/27/24  1:53 PM  Result Value Ref Range   Glucose-Capillary 113 (H) 70 - 99 mg/dL  Glucose, capillary   Collection Time: 07/27/24  5:49 PM  Result Value Ref Range   Glucose-Capillary 179 (H) 70 - 99 mg/dL    Discharge Medications:   Allergies as of 07/28/2024       Reactions   Codeine Anaphylaxis   Pt. verbalized that he has taken and can take Hydrocodone , Oxycodone  w/o issue - codeine allergy only   Ambien [zolpidem Tartrate] Other (See Comments)   Headache    Cipro [ciprofloxacin Hcl] Hives, Rash   Crestor  [rosuvastatin ] Other (See Comments)   Myalgia    Isordil  [isosorbide  Nitrate] Other (See Comments)   Headaches   Lipitor [atorvastatin] Other (See Comments)   Myalgia    Victoza  [liraglutide ] Rash   Zetia  [ezetimibe ] Other (See Comments)   Myalgia         Medication List     STOP taking these medications    traMADol  50 MG tablet Commonly known as: ULTRAM        TAKE these medications    Accu-Chek Guide Test test strip Generic drug: glucose blood USE TO TEST BLOOD SUGAR EVERY DAY   amLODipine  5 MG  tablet Commonly known as: NORVASC  TAKE 1 AND 1/2 TABLETS DAILY BY MOUTH What changed: See the new instructions.   aspirin  EC 81 MG tablet Take 1 tablet (81 mg total) by mouth 2 (two) times daily. Swallow whole.   clonazePAM  0.5 MG tablet Commonly known as: KLONOPIN  Take 1 tablet (0.5 mg total) by mouth 2 (two) times daily as needed for anxiety. What changed:  how much to take when to take this   clopidogrel  75 MG tablet Commonly known as: PLAVIX  TAKE 1 TABLET BY MOUTH EVERY DAY WITH BREAKFAST   CoQ10 100 MG Caps Take 100 mg by mouth daily.   dapagliflozin  propanediol 10 MG Tabs tablet Commonly known as: Farxiga  Take 1 tablet (10 mg total) by mouth daily.   diphenhydramine-acetaminophen  25-500 MG Tabs tablet Commonly known as: TYLENOL  PM Take 2 tablets by mouth  at bedtime.   docusate sodium  100 MG capsule Commonly known as: COLACE Take 1 capsule (100 mg total) by mouth 2 (two) times daily.   ferrous sulfate  325 (65 FE) MG tablet TAKE 325 MG BY MOUTH 2 (TWO) TIMES DAILY.   gabapentin  300 MG capsule Commonly known as: NEURONTIN  Take 3 capsules (900 mg total) by mouth 2 (two) times daily.   Geritol Complete Tabs Take 1 tablet by mouth daily.   glipiZIDE  5 MG tablet Commonly known as: GLUCOTROL  Take 1 tablet (5 mg total) by mouth daily. What changed: when to take this   MELATONIN GUMMIES PO Take 3 each by mouth at bedtime.   methocarbamol  500 MG tablet Commonly known as: ROBAXIN  Take 1 tablet (500 mg total) by mouth every 8 (eight) hours as needed for muscle spasms.   nebivolol  2.5 MG tablet Commonly known as: BYSTOLIC  Take 1 tablet (2.5 mg total) by mouth daily.   ondansetron  4 MG tablet Commonly known as: ZOFRAN  Take 1 tablet (4 mg total) by mouth every 8 (eight) hours as needed for nausea or vomiting.   oxyCODONE  5 MG immediate release tablet Commonly known as: Oxy IR/ROXICODONE  Take 1-2 tablets (5-10 mg total) by mouth every 4 (four) hours as needed for moderate pain (pain score 4-6) (pain score 4-6).   pantoprazole  40 MG tablet Commonly known as: PROTONIX  TAKE 1 TABLET BY MOUTH TWICE A DAY   pioglitazone  45 MG tablet Commonly known as: ACTOS  Take 1 tablet (45 mg total) by mouth daily.   Repatha  SureClick 140 MG/ML Soaj Generic drug: Evolocumab  Inject 140 mg into the skin every 14 (fourteen) days.   sildenafil  100 MG tablet Commonly known as: Viagra  Take 1 tablet (100 mg total) by mouth daily as needed for erectile dysfunction.   tamsulosin  0.4 MG Caps capsule Commonly known as: FLOMAX  TAKE 1 CAPSULE BY MOUTH EVERY DAY   traZODone  100 MG tablet Commonly known as: DESYREL  TAKE 1 TABLET BY MOUTH EVERYDAY AT BEDTIME        Diagnostic Studies: DG Pelvis Portable Result Date: 07/27/2024 CLINICAL DATA:  Status  post left hip replacement. EXAM: PORTABLE PELVIS 1-2 VIEWS COMPARISON:  Pelvic radiograph dated 02/26/2022. FINDINGS: Total left hip arthroplasty. The arthroplasty components appear intact and in anatomic alignment. No acute fracture or dislocation. Moderate arthritic changes of the right hip. The soft tissues are unremarkable. A wound VAC noted over the left hip. IMPRESSION: Total left hip arthroplasty. No acute fracture or dislocation. Electronically Signed   By: Vanetta Chou M.D.   On: 07/27/2024 18:39   DG HIP UNILAT WITH PELVIS 1V LEFT Result Date: 07/27/2024 CLINICAL DATA:  Elective surgery EXAM: DG HIP (WITH OR WITHOUT PELVIS) 1V*L* COMPARISON:  None Available. FINDINGS: Five fluoroscopic spot views of the pelvis and left hip obtained in the operating room. Sequential images during hip arthroplasty. Fluoroscopy time 40 seconds. Dose 4.6 mGy. IMPRESSION: Intraoperative fluoroscopy during left hip arthroplasty. Electronically Signed   By: Andrea Gasman M.D.   On: 07/27/2024 18:09   DG C-Arm 1-60 Min-No Report Result Date: 07/27/2024 Fluoroscopy was utilized by the requesting physician.  No radiographic interpretation.   DG C-Arm 1-60 Min-No Report Result Date: 07/27/2024 Fluoroscopy was utilized by the requesting physician.  No radiographic interpretation.   DG C-Arm 1-60 Min-No Report Result Date: 07/27/2024 Fluoroscopy was utilized by the requesting physician.  No radiographic interpretation.   DG MYELOGRAPHY LUMBAR INJ LUMBOSACRAL Result Date: 07/11/2024 EXAM: LUMBAR MYELOGRAM UNDER FLUOROSCOPY CT OF THE LUMBAR SPINE WITH INTRATHECAL CONTRAST 07/11/2024 10:23:28 AM TECHNIQUE: 5 non rib bearing lumbar segment L1-L5. Using sterile technique after sterile prep and local anesthetic, 22g needle advanced into thecal sac for intrathecal administration of contrast. Injection at L3 from a left parasagittal approach. Mixed injection was noted so the needle was repositioned at the same level for  intrathecal administration of the remainder contrast. Lumbar myelographic images were obtained. Radiation exposure index (as provided by the fluoroscopic device): 10.1 mGy air kerma. CT of the lumbar spine was performed after the administration of intrathecal contrast with multiplanar reconstructed images provided for interpretation. Dose modulation, iterative reconstruction, and/or weight based adjustment of the mA/kV was utilized to reduce the radiation dose to as low as reasonably achievable. COMPARISON: CT 01/06/2023 and previous. CLINICAL HISTORY: CT myelogram lumbar spine eval progression of L3-4 L4-5 left sided HNP ON PLAVIX . FINDINGS: BONES AND ALIGNMENT: There is normal alignment of the spine. The vertebral body heights are maintained. No destructive osseous lesion is seen. Small anterior endplate spurs at O6-O5. Mild central compression deformities at L3 and L4, stable since 03/25/2021. Vertebral endplate spurring at multiple levels in the lower thoracic spine. No dynamic instability on standing lateral flexion/extension radiograph. The conus medullaris is terminated behind L1. SOFT TISSUES: No paraspinal mass or fluid collection. Scattered sigmoid diverticula. Moderate scattered iliac atheromatous plaque without aneurysm. T11-T12: Central canal and foramina patent. Minimal right facet degenerative joint disease (DJD). T12-L1: Interspace unremarkable. Central canal and foramina patent. L1-L2: Interspace unremarkable. Central canal and foramina patent. L2-L3: Mild broad based posterior disc bulge. Mild bilateral facet DJD. No spinal stenosis. Foramina patent. L3-L4: Mild bilateral facet DJD. Broad left foraminal protrusion, slightly decreased since 03/03/2022. Small anterior endplate spurs. Mild central compression deformities stable since 03/25/2021. L4-L5: Moderate facet DJD, left worse than right. Extruded fragment posterior to the L4 vertebral body on the left is decreased in size. Broad left  posterolateral and foraminal bulge, decreased from previous. Mild central canal stenosis. Foramina patent. L5-S1: Early central vacuum phenomenon in the interspace. Mild posterior disc bulge, right greater than left. Mild facet DJD. Central canal and foramina are patent. No spinal stenosis. IMPRESSION: 1. Decreased left protrusion at L3-L4, with patent central canal and foramina. 2. Mild central canal stenosis at L4-L5, with patent foramina. Electronically signed by: Katheleen Faes MD 07/11/2024 11:36 AM EST RP Workstation: [id number]   CT LUMBAR SPINE W CONTRAST Result Date: 07/11/2024 EXAM: LUMBAR MYELOGRAM UNDER FLUOROSCOPY CT OF THE LUMBAR SPINE WITH INTRATHECAL CONTRAST 07/11/2024 10:23:28 AM TECHNIQUE: 5 non rib bearing lumbar segment L1-L5. Using sterile technique after sterile prep and local anesthetic, 22g needle advanced into thecal sac for intrathecal administration  of contrast. Injection at L3 from a left parasagittal approach. Mixed injection was noted so the needle was repositioned at the same level for intrathecal administration of the remainder contrast. Lumbar myelographic images were obtained. Radiation exposure index (as provided by the fluoroscopic device): 10.1 mGy air kerma. CT of the lumbar spine was performed after the administration of intrathecal contrast with multiplanar reconstructed images provided for interpretation. Dose modulation, iterative reconstruction, and/or weight based adjustment of the mA/kV was utilized to reduce the radiation dose to as low as reasonably achievable. COMPARISON: CT 01/06/2023 and previous. CLINICAL HISTORY: CT myelogram lumbar spine eval progression of L3-4 L4-5 left sided HNP ON PLAVIX . FINDINGS: BONES AND ALIGNMENT: There is normal alignment of the spine. The vertebral body heights are maintained. No destructive osseous lesion is seen. Small anterior endplate spurs at O6-O5. Mild central compression deformities at L3 and L4, stable since 03/25/2021.  Vertebral endplate spurring at multiple levels in the lower thoracic spine. No dynamic instability on standing lateral flexion/extension radiograph. The conus medullaris is terminated behind L1. SOFT TISSUES: No paraspinal mass or fluid collection. Scattered sigmoid diverticula. Moderate scattered iliac atheromatous plaque without aneurysm. T11-T12: Central canal and foramina patent. Minimal right facet degenerative joint disease (DJD). T12-L1: Interspace unremarkable. Central canal and foramina patent. L1-L2: Interspace unremarkable. Central canal and foramina patent. L2-L3: Mild broad based posterior disc bulge. Mild bilateral facet DJD. No spinal stenosis. Foramina patent. L3-L4: Mild bilateral facet DJD. Broad left foraminal protrusion, slightly decreased since 03/03/2022. Small anterior endplate spurs. Mild central compression deformities stable since 03/25/2021. L4-L5: Moderate facet DJD, left worse than right. Extruded fragment posterior to the L4 vertebral body on the left is decreased in size. Broad left posterolateral and foraminal bulge, decreased from previous. Mild central canal stenosis. Foramina patent. L5-S1: Early central vacuum phenomenon in the interspace. Mild posterior disc bulge, right greater than left. Mild facet DJD. Central canal and foramina are patent. No spinal stenosis. IMPRESSION: 1. Decreased left protrusion at L3-L4, with patent central canal and foramina. 2. Mild central canal stenosis at L4-L5, with patent foramina. Electronically signed by: Dayne Hassell MD 07/11/2024 11:36 AM EST RP Workstation: [id number]    Disposition: Discharge disposition: 01-Home or Self Care       Discharge Instructions     Call MD / Call 911   Complete by: As directed    If you experience chest pain or shortness of breath, CALL 911 and be transported to the hospital emergency room.  If you develope a fever above 101 F, pus (white drainage) or increased drainage or redness at the wound, or  calf pain, call your surgeon's office.   Constipation Prevention   Complete by: As directed    Drink plenty of fluids.  Prune juice may be helpful.  You may use a stool softener, such as Colace (over the counter) 100 mg twice a day.  Use MiraLax (over the counter) for constipation as needed.   Discharge instructions   Complete by: As directed    You may shower, dressing is waterproof.  Do not remove the dressing, we will remove it at your first post-op appointment.  Do not take a bath or soak the hip in a tub or pool.  You may weightbear as you can tolerate on the operative leg with a walker.  Continue with PT exercises from your hospital encounter. You will follow-up with Dr. Addie in the clinic in 2 weeks at your given appointment date.    INSTRUCTIONS AFTER JOINT REPLACEMENT  Remove items at home which could result in a fall. This includes throw rugs or furniture in walking pathways ICE to the affected joint every three hours while awake for 30 minutes at a time, for at least the first 3-5 days, and then as needed for pain and swelling.  Continue to use ice for pain and swelling. You may notice swelling that will progress down to the foot and ankle.  This is normal after surgery.  Elevate your leg when you are not up walking on it.   Continue to use the breathing machine you got in the hospital (incentive spirometer) which will help keep your temperature down.  It is common for your temperature to cycle up and down following surgery, especially at night when you are not up moving around and exerting yourself.  The breathing machine keeps your lungs expanded and your temperature down.   DIET:  As you were doing prior to hospitalization, we recommend a well-balanced diet.  DRESSING / WOUND CARE / SHOWERING  Keep the surgical dressing until follow up.  The dressing is water  proof, so you can shower without any extra covering.  IF THE DRESSING FALLS OFF or the wound gets wet inside, change the  dressing with sterile gauze.  Please use good hand washing techniques before changing the dressing.  Do not use any lotions or creams on the incision until instructed by your surgeon.    ACTIVITY  Increase activity slowly as tolerated, but follow the weight bearing instructions below.   No driving for 6 weeks or until further direction given by your physician.  You cannot drive while taking narcotics.  No lifting or carrying greater than 10 lbs. until further directed by your surgeon. Avoid periods of inactivity such as sitting longer than an hour when not asleep. This helps prevent blood clots.  You may return to work once you are authorized by your doctor.     WEIGHT BEARING   Weight bearing as tolerated with assist device (walker, cane, etc) as directed, use it as long as suggested by your surgeon or therapist, typically at least 4-6 weeks.   EXERCISES  Results after joint replacement surgery are often greatly improved when you follow the exercise, range of motion and muscle strengthening exercises prescribed by your doctor. Safety measures are also important to protect the joint from further injury. Any time any of these exercises cause you to have increased pain or swelling, decrease what you are doing until you are comfortable again and then slowly increase them. If you have problems or questions, call your caregiver or physical therapist for advice.   Rehabilitation is important following a joint replacement. After just a few days of immobilization, the muscles of the leg can become weakened and shrink (atrophy).  These exercises are designed to build up the tone and strength of the thigh and leg muscles and to improve motion. Often times heat used for twenty to thirty minutes before working out will loosen up your tissues and help with improving the range of motion but do not use heat for the first two weeks following surgery (sometimes heat can increase post-operative swelling).    These exercises can be done on a training (exercise) mat, on the floor, on a table or on a bed. Use whatever works the best and is most comfortable for you.    Use music or television while you are exercising so that the exercises are a pleasant break in your day. This will make your  life better with the exercises acting as a break in your routine that you can look forward to.   Perform all exercises about fifteen times, three times per day or as directed.  You should exercise both the operative leg and the other leg as well.  Exercises include:   Quad Sets - Tighten up the muscle on the front of the thigh (Quad) and hold for 5-10 seconds.   Straight Leg Raises - With your knee straight (if you were given a brace, keep it on), lift the leg to 60 degrees, hold for 3 seconds, and slowly lower the leg.  Perform this exercise against resistance later as your leg gets stronger.  Leg Slides: Lying on your back, slowly slide your foot toward your buttocks, bending your knee up off the floor (only go as far as is comfortable). Then slowly slide your foot back down until your leg is flat on the floor again.  Angel Wings: Lying on your back spread your legs to the side as far apart as you can without causing discomfort.  Hamstring Strength:  Lying on your back, push your heel against the floor with your leg straight by tightening up the muscles of your buttocks.  Repeat, but this time bend your knee to a comfortable angle, and push your heel against the floor.  You may put a pillow under the heel to make it more comfortable if necessary.   A rehabilitation program following joint replacement surgery can speed recovery and prevent re-injury in the future due to weakened muscles. Contact your doctor or a physical therapist for more information on knee rehabilitation.    CONSTIPATION  Constipation is defined medically as fewer than three stools per week and severe constipation as less than one stool per week.   Even if you have a regular bowel pattern at home, your normal regimen is likely to be disrupted due to multiple reasons following surgery.  Combination of anesthesia, postoperative narcotics, change in appetite and fluid intake all can affect your bowels.   YOU MUST use at least one of the following options; they are listed in order of increasing strength to get the job done.  They are all available over the counter, and you may need to use some, POSSIBLY even all of these options:    Drink plenty of fluids (prune juice may be helpful) and high fiber foods Colace 100 mg by mouth twice a day  Senokot for constipation as directed and as needed Dulcolax (bisacodyl), take with full glass of water   Miralax (polyethylene glycol) once or twice a day as needed.  If you have tried all these things and are unable to have a bowel movement in the first 3-4 days after surgery call either your surgeon or your primary doctor.    If you experience loose stools or diarrhea, hold the medications until you stool forms back up.  If your symptoms do not get better within 1 week or if they get worse, check with your doctor.  If you experience the worst abdominal pain ever or develop nausea or vomiting, please contact the office immediately for further recommendations for treatment.   ITCHING:  If you experience itching with your medications, try taking only a single pain pill, or even half a pain pill at a time.  You can also use Benadryl over the counter for itching or also to help with sleep.   TED HOSE STOCKINGS:  Use stockings on both legs until for at least 2  weeks or as directed by physician office. They may be removed at night for sleeping.  MEDICATIONS:  See your medication summary on the After Visit Summary that nursing will review with you.  You may have some home medications which will be placed on hold until you complete the course of blood thinner medication.  It is important for you to complete the  blood thinner medication as prescribed.  PRECAUTIONS:  If you experience chest pain or shortness of breath - call 911 immediately for transfer to the hospital emergency department.   If you develop a fever greater that 101 F, purulent drainage from wound, increased redness or drainage from wound, foul odor from the wound/dressing, or calf pain - CONTACT YOUR SURGEON.                                                   FOLLOW-UP APPOINTMENTS:  If you do not already have a post-op appointment, please call the office for an appointment to be seen by your surgeon.  Guidelines for how soon to be seen are listed in your After Visit Summary, but are typically between 1-4 weeks after surgery.  OTHER INSTRUCTIONS:   POST-OPERATIVE OPIOID TAPER INSTRUCTIONS: It is important to wean off of your opioid medication as soon as possible. If you do not need pain medication after your surgery it is ok to stop day one. Opioids include: Codeine, Hydrocodone (Norco, Vicodin), Oxycodone (Percocet, oxycontin ) and hydromorphone  amongst others.  Long term and even short term use of opiods can cause: Increased pain response Dependence Constipation Depression Respiratory depression And more.  Withdrawal symptoms can include Flu like symptoms Nausea, vomiting And more Techniques to manage these symptoms Hydrate well Eat regular healthy meals Stay active Use relaxation techniques(deep breathing, meditating, yoga) Do Not substitute Alcohol to help with tapering If you have been on opioids for less than two weeks and do not have pain than it is ok to stop all together.  Plan to wean off of opioids This plan should start within one week post op of your joint replacement. Maintain the same interval or time between taking each dose and first decrease the dose.  Cut the total daily intake of opioids by one tablet each day Next start to increase the time between doses. The last dose that should be eliminated is the  evening dose.   MAKE SURE YOU:  Understand these instructions.  Get help right away if you are not doing well or get worse.    Thank you for letting us  be a part of your medical care team.  It is a privilege we respect greatly.  We hope these instructions will help you stay on track for a fast and full recovery!    Dental Antibiotics:  In most cases prophylactic antibiotics for Dental procdeures after total joint surgery are not necessary.  Exceptions are as follows:  1. History of prior total joint infection  2. Severely immunocompromised (Organ Transplant, cancer chemotherapy, Rheumatoid biologic meds such as Humera)  3. Poorly controlled diabetes (A1C &gt; 8.0, blood glucose over 200)  If you have one of these conditions, contact your surgeon for an antibiotic prescription, prior to your dental procedure.   Increase activity slowly as tolerated   Complete by: As directed    Post-operative opioid taper instructions:   Complete by: As directed  POST-OPERATIVE OPIOID TAPER INSTRUCTIONS: It is important to wean off of your opioid medication as soon as possible. If you do not need pain medication after your surgery it is ok to stop day one. Opioids include: Codeine, Hydrocodone (Norco, Vicodin), Oxycodone (Percocet, oxycontin ) and hydromorphone  amongst others.  Long term and even short term use of opiods can cause: Increased pain response Dependence Constipation Depression Respiratory depression And more.  Withdrawal symptoms can include Flu like symptoms Nausea, vomiting And more Techniques to manage these symptoms Hydrate well Eat regular healthy meals Stay active Use relaxation techniques(deep breathing, meditating, yoga) Do Not substitute Alcohol to help with tapering If you have been on opioids for less than two weeks and do not have pain than it is ok to stop all together.  Plan to wean off of opioids This plan should start within one week post op of your joint  replacement. Maintain the same interval or time between taking each dose and first decrease the dose.  Cut the total daily intake of opioids by one tablet each day Next start to increase the time between doses. The last dose that should be eliminated is the evening dose.           Contact information for follow-up providers     Johnny Garnette LABOR, MD. Call on 08/02/2024.   Specialty: Family Medicine Why: Post hospital follow up with PCP scheduled for 08/02/2024 at [facility] information: [address] [place] [address] [phone]              Contact information for after-discharge care     Home Medical Care     [facility]) .   Service: Home Health Services Why: Home health services will be provided by [facility] Contact information: [address] [place] [address] [place]  [address] [phone]                      Signed: Herlene Calix 08/02/2024, 1:46 PM

## 2024-08-04 ENCOUNTER — Ambulatory Visit (INDEPENDENT_AMBULATORY_CARE_PROVIDER_SITE_OTHER): Admitting: Orthopedic Surgery

## 2024-08-04 ENCOUNTER — Encounter: Payer: Self-pay | Admitting: Orthopedic Surgery

## 2024-08-04 DIAGNOSIS — M1612 Unilateral primary osteoarthritis, left hip: Secondary | ICD-10-CM

## 2024-08-04 NOTE — Progress Notes (Signed)
 "  Post-Op Visit Note   Patient: [name]           Date of Birth: 1951-02-17           MRN: [id number] Visit Date: 08/04/2024 PCP: Johnny Garnette LABOR, MD   Assessment & Plan:  Chief Complaint:  Chief Complaint  Patient presents with   Left Hip - Routine Post Op, Wound Check   Visit Diagnoses:  1. Arthritis of left hip     Plan: Noemi is now a week out left total hip replacement.  Coming in today to have the Prevena wound VAC removed.  He is doing well getting around with a walker.  Has had 1 episode of home health PT.  He is on Plavix  and aspirin .  On exam he does have some mild left leg swelling.  No calf tenderness negative Homans.  Leg lengths approximately equal.  Has weaker hip flexion on the left at 3 out of 5 compared to the right.  Otherwise abduction adduction and leg extension strength 5+ out of 5 bilaterally.  Incision itself looks good.  Steri-Strips applied.  2 nylon sutures removed.  Plan at this time is to continue to optimize blood glucose management.  Come back in 2 weeks and we will start him in physical therapy at that time.  He will need radiographs of the left hip at that time as well.  Follow-Up Instructions: No follow-ups on file.   Orders:  No orders of the defined types were placed in this encounter.  No orders of the defined types were placed in this encounter.   Imaging: No results found.  PMFS History: Patient Active Problem List   Diagnosis Date Noted   S/P total knee replacement, left 07/27/2024   Presence of drug coated stent in left circumflex coronary artery 05/14/2024   Chronic anemia 05/14/2024   Statin myopathy 05/14/2024   Pain in left hip 02/26/2022   Unstable angina (HCC) 01/10/2022   BPH with urinary obstruction 08/05/2021   Erectile dysfunction 08/05/2021   Type 2 diabetes mellitus without complication, without long-term current use of insulin  (HCC) 08/04/2017   Osteoarthritis 04/28/2017   Insomnia 03/03/2017   Fatigue  05/28/2014   Anxiety state 06/09/2010   NECK PAIN 09/03/2009   LEG PAIN 07/16/2008   HEADACHE 07/16/2008   LOSS, HEARING NOS 05/03/2007   Hyperlipidemia with target low density lipoprotein (LDL) cholesterol less than 55 mg/dL 90/70/7991   Essential hypertension 04/04/2007   Coronary artery disease involving native coronary artery without angina pectoris 2002   Past Medical History:  Diagnosis Date   Anemia    Anginal pain    Arthritis    Chronic headache    Chronic neck pain    patient denies on 07/27/24   Coronary artery disease    Diabetes mellitus without complication (HCC)    Diverticulosis    Hyperlipidemia    Hypertension 12/26/2008   ECHO- EF>55%   Insomnia    Insomnia    Restless leg syndrome     Family History  Problem Relation Age of Onset   Heart disease Mother    Liver cancer Father    Cancer Brother    Cancer Brother    Cancer Brother    Coronary artery disease Other    Hypertension Other    Colon cancer Neg Hx    Stomach cancer Neg Hx    Esophageal cancer Neg Hx    Colon polyps Neg Hx    Rectal cancer  Neg Hx     Past Surgical History:  Procedure Laterality Date   APPENDECTOMY     cervical spine injection     COLONOSCOPY  04/02/2006   per [facility], clear, repeat in 10 yrs   CORONARY ARTERY BYPASS GRAFT  07/06/2000   4 vessels   CORONARY PRESSURE/FFR STUDY N/A 01/12/2022   Procedure: INTRAVASCULAR PRESSURE WIRE/FFR STUDY;  Surgeon: Court Dorn PARAS, MD;  Location: [facility];  Service: Cardiovascular;  Laterality: N/A;   CORONARY STENT INTERVENTION N/A 01/12/2022   Procedure: CORONARY STENT INTERVENTION;  Surgeon: Court Dorn PARAS, MD;  Location: [facility];  Service: Cardiovascular;  Laterality: N/A;   CYST REMOVAL HAND Right    LEFT HEART CATH AND CORS/GRAFTS ANGIOGRAPHY N/A 01/12/2022   Procedure: LEFT HEART CATH AND CORS/GRAFTS ANGIOGRAPHY;  Surgeon: Court Dorn PARAS, MD;  Location: [facility];  Service:  Cardiovascular;  Laterality: N/A;   TOTAL HIP ARTHROPLASTY Left 07/27/2024   Procedure: ARTHROPLASTY, HIP, TOTAL, ANTERIOR APPROACH;  Surgeon: Addie Cordella Hamilton, MD;  Location[facility];  Service: Orthopedics;  Laterality: Left;   Social History   Occupational History   Not on file  Tobacco Use   Smoking status: Former    Current packs/day: 0.00    Average packs/day: 0.3 packs/day for 10.0 years (2.5 ttl pk-yrs)    Types: Cigarettes    Start date: 11/25/1960    Quit date: 11/26/1970    Years since quitting: 53.7   Smokeless tobacco: Never  Vaping Use   Vaping status: Never Used  Substance and Sexual Activity   Alcohol use: No    Alcohol/week: 0.0 standard drinks of alcohol   Drug use: No   Sexual activity: Yes     "

## 2024-08-05 DIAGNOSIS — M1612 Unilateral primary osteoarthritis, left hip: Secondary | ICD-10-CM

## 2024-08-09 ENCOUNTER — Other Ambulatory Visit (HOSPITAL_COMMUNITY): Payer: Self-pay

## 2024-08-09 ENCOUNTER — Telehealth: Payer: Self-pay

## 2024-08-09 NOTE — Telephone Encounter (Signed)
 Pharmacy Patient Advocate Encounter  Received notification from [facility] that Prior Authorization for Repatha  Sureclick 140 has been APPROVED from 08/09/24 to 08/09/25. Ran test claim, Copay is $552.73 due to deductible. This test claim was processed through [facility]- copay amounts may vary at other pharmacies due to pharmacy/plan contracts, or as the patient moves through the different stages of their insurance plan.   PA #/Case ID/Reference #: # [id number]

## 2024-08-09 NOTE — Telephone Encounter (Signed)
 Pharmacy Patient Advocate Encounter   Received notification from [employer] that prior authorization for Repatha  Sureclick 140 is required/requested.   Insurance verification completed.   The patient is insured through [facility].   Per test claim: PA required; PA submitted to above mentioned insurance via Latent Key/confirmation #/[id number] Status is pending

## 2024-08-10 ENCOUNTER — Encounter: Admitting: Orthopedic Surgery

## 2024-08-11 ENCOUNTER — Other Ambulatory Visit: Payer: Self-pay | Admitting: Family Medicine

## 2024-08-18 ENCOUNTER — Encounter: Admitting: Surgical

## 2024-09-04 ENCOUNTER — Inpatient Hospital Stay: Admitting: Family Medicine
